# Patient Record
Sex: Male | Born: 1955
Health system: Southern US, Community
[De-identification: ages and names within clinical notes are randomized; demographics above are authoritative.]

## PROBLEM LIST (undated history)

## (undated) DIAGNOSIS — F1010 Alcohol abuse, uncomplicated: Secondary | ICD-10-CM

## (undated) DIAGNOSIS — R569 Unspecified convulsions: Secondary | ICD-10-CM

## (undated) DIAGNOSIS — Z91148 Patient's other noncompliance with medication regimen for other reason: Secondary | ICD-10-CM

## (undated) DIAGNOSIS — E039 Hypothyroidism, unspecified: Secondary | ICD-10-CM

## (undated) DIAGNOSIS — I4510 Unspecified right bundle-branch block: Secondary | ICD-10-CM

## (undated) DIAGNOSIS — I10 Essential (primary) hypertension: Secondary | ICD-10-CM

## (undated) DIAGNOSIS — Z9114 Patient's other noncompliance with medication regimen: Secondary | ICD-10-CM

## (undated) HISTORY — DX: Alcohol abuse, uncomplicated: F10.10

## (undated) HISTORY — DX: Unspecified right bundle-branch block: I45.10

## (undated) HISTORY — DX: Essential (primary) hypertension: I10

## (undated) HISTORY — DX: Hypothyroidism, unspecified: E03.9

## (undated) HISTORY — PX: NO PAST SURGERIES: SHX2092

## (undated) NOTE — *Deleted (*Deleted)
Patient Name: Mason Hart  MRN: [id number]  Epilepsy Attending: Charlsie Quest  Referring Physician/Provider: Dr. Shahram Giovanni Date: 10/21/2020 Duration:   Patient history: 33 year old male with history of alcohol abuse, seizures, noncompliance of medications presented with altered mental status.  EEG evaluate for seizures.  Level of alertness: Awake, drowsy, sleep, comatose, lethargic  AEDs during EEG study: Keppra  Technical aspects: This EEG study was done with scalp electrodes positioned according to the 10-20 International system of electrode placement. Electrical activity was acquired at a sampling rate of 500Hz  and reviewed with a high frequency filter of 70Hz  and a low frequency filter of 1Hz . EEG data were recorded continuously and digitally stored.   Description: The posterior dominant rhythm consists of 9-10 Hz activity of moderate voltage (25-35 uV) seen predominantly in posterior head regions, symmetric and reactive to eye opening and eye closing. Drowsiness was characterized by attenuation of the posterior background rhythm. Sleep was characterized by vertex waves, sleep spindles (12 to 14 Hz), maximal frontocentral region.  There is an excessive amount of 15 to 18 Hz, 2-3 uV beta activity with irregular morphology distributed symmetrically and diffusely.   EEG showed continuous/intermittent generalized 3 to 6 Hz theta-delta slowing.  Hyperventilation did not show any EEG change.  Physiology photic driving was not seen during photic stimulation.  Hyperventilation and photic stimulation were not performed.     ABNORMALITY -Excessive beta, generalized -Intermittent slow, generalized -Continued slow, generalized -Triphasic waves, generalized -Sharp wave, -Spike, -Periodic epileptiform discharges, generalized/lateralized   IMPRESSION: This study is within normal limits.  The study suggestive of mild/moderate/severe diffuse encephalopathy, nonspecific etiology but  likely related to sedation, toxic-metabolic etiology, anoxic/hypoxic brain injury This study showed evidence of potential epileptogenicity arising from The study showed seizures arising from No seizures or epileptiform discharges were seen throughout the recording. However, only wakefulness and drowsiness were recorded. If suspicion for interictal activity remains a concern, a prolonged study including sleep should be considered.  The excessive beta activity seen in the background is most likely due to the effect of benzodiazepine and is a benign EEG pattern.   [name]

---

## 2006-11-29 ENCOUNTER — Emergency Department (HOSPITAL_COMMUNITY): Admission: EM | Admit: 2006-11-29 | Discharge: 2006-11-29 | Payer: Self-pay | Admitting: Emergency Medicine

## 2007-02-18 ENCOUNTER — Emergency Department (HOSPITAL_COMMUNITY): Admission: EM | Admit: 2007-02-18 | Discharge: 2007-02-18 | Payer: Self-pay | Admitting: Emergency Medicine

## 2008-01-05 ENCOUNTER — Emergency Department (HOSPITAL_COMMUNITY): Admission: EM | Admit: 2008-01-05 | Discharge: 2008-01-05 | Payer: Self-pay | Admitting: Emergency Medicine

## 2008-07-13 ENCOUNTER — Emergency Department (HOSPITAL_COMMUNITY): Admission: EM | Admit: 2008-07-13 | Discharge: 2008-07-13 | Payer: Self-pay | Admitting: Emergency Medicine

## 2009-06-14 ENCOUNTER — Emergency Department (HOSPITAL_COMMUNITY): Admission: EM | Admit: 2009-06-14 | Discharge: 2009-06-14 | Payer: Self-pay | Admitting: Emergency Medicine

## 2010-01-03 ENCOUNTER — Emergency Department (HOSPITAL_COMMUNITY): Admission: EM | Admit: 2010-01-03 | Discharge: 2010-01-03 | Payer: Self-pay | Admitting: Emergency Medicine

## 2010-03-15 ENCOUNTER — Emergency Department (HOSPITAL_COMMUNITY): Admission: EM | Admit: 2010-03-15 | Discharge: 2010-03-15 | Payer: Self-pay | Admitting: Emergency Medicine

## 2010-04-17 ENCOUNTER — Emergency Department (HOSPITAL_COMMUNITY): Admission: EM | Admit: 2010-04-17 | Discharge: 2010-04-17 | Payer: Self-pay | Admitting: Emergency Medicine

## 2010-06-10 ENCOUNTER — Emergency Department (HOSPITAL_COMMUNITY): Admission: EM | Admit: 2010-06-10 | Discharge: 2010-06-10 | Payer: Self-pay | Admitting: Family Medicine

## 2010-09-05 ENCOUNTER — Emergency Department (HOSPITAL_COMMUNITY): Admission: EM | Admit: 2010-09-05 | Discharge: 2010-09-05 | Payer: Self-pay | Admitting: Emergency Medicine

## 2011-02-09 LAB — BASIC METABOLIC PANEL
CO2: 28 mEq/L (ref 19–32)
Calcium: 8.9 mg/dL (ref 8.4–10.5)
GFR calc non Af Amer: >60 mL/min (ref >60)
Sodium: 140 mEq/L (ref 135–145)

## 2011-02-09 LAB — ETHANOL: Alcohol, Ethyl (B): 6 mg/dL (ref 0–10)

## 2011-02-09 LAB — PHENYTOIN LEVEL, TOTAL: Phenytoin Lvl: <2.5 ug/mL — ABNORMAL LOW (ref 10.0–20.0)

## 2011-02-13 LAB — URINALYSIS, ROUTINE W REFLEX MICROSCOPIC
Bilirubin Urine: NEGATIVE
Glucose, UA: NEGATIVE mg/dL
Ketones, ur: NEGATIVE mg/dL
Protein, ur: NEGATIVE mg/dL

## 2011-02-13 LAB — GLUCOSE, CAPILLARY: Glucose-Capillary: 105 mg/dL — ABNORMAL HIGH (ref 70–99)

## 2011-02-13 LAB — POCT I-STAT, CHEM 8
Chloride: 101 mEq/L (ref 96–112)
Creatinine, Ser: 0.6 mg/dL (ref 0.4–1.5)
Glucose, Bld: 98 mg/dL (ref 70–99)
HCT: 41 % (ref 39.0–52.0)
Hemoglobin: 13.9 g/dL (ref 13.0–17.0)

## 2011-02-15 LAB — CBC
MCHC: 34.4 g/dL (ref 30.0–36.0)
MCV: 94.3 fL (ref 78.0–100.0)
RBC: 3.96 MIL/uL — ABNORMAL LOW (ref 4.22–5.81)
RDW: 13 % (ref 11.5–15.5)
WBC: 9.7 10*3/uL (ref 4.0–10.5)

## 2011-02-15 LAB — RAPID URINE DRUG SCREEN, HOSP PERFORMED
Amphetamines: NOT DETECTED
Barbiturates: NOT DETECTED
Benzodiazepines: NOT DETECTED
Cocaine: NOT DETECTED
Opiates: NOT DETECTED
Tetrahydrocannabinol: NOT DETECTED

## 2011-02-15 LAB — URINALYSIS, ROUTINE W REFLEX MICROSCOPIC
Glucose, UA: NEGATIVE mg/dL
Leukocytes, UA: NEGATIVE
Protein, ur: NEGATIVE mg/dL
pH: 5.5 (ref 5.0–8.0)

## 2011-02-15 LAB — COMPREHENSIVE METABOLIC PANEL
ALT: 62 U/L — ABNORMAL HIGH (ref 0–53)
AST: 74 U/L — ABNORMAL HIGH (ref 0–37)
CO2: 29 mEq/L (ref 19–32)
Calcium: 9.1 mg/dL (ref 8.4–10.5)
Creatinine, Ser: 0.68 mg/dL (ref 0.4–1.5)
Glucose, Bld: 100 mg/dL — ABNORMAL HIGH (ref 70–99)
Potassium: 4 mEq/L (ref 3.5–5.1)
Total Protein: 7.8 g/dL (ref 6.0–8.3)

## 2011-02-15 LAB — POCT CARDIAC MARKERS
CKMB, poc: 4.6 ng/mL (ref 1.0–8.0)
Myoglobin, poc: 87.7 ng/mL (ref 12–200)

## 2011-02-15 LAB — ETHANOL: Alcohol, Ethyl (B): 5 mg/dL (ref 0–10)

## 2011-02-15 LAB — URINE MICROSCOPIC-ADD ON

## 2011-02-15 LAB — DIFFERENTIAL: Lymphocytes Relative: 12 % (ref 12–46)

## 2011-02-23 ENCOUNTER — Emergency Department (HOSPITAL_COMMUNITY)
Admission: EM | Admit: 2011-02-23 | Discharge: 2011-02-23 | Disposition: A | Discharge status: Home / Self Care | Payer: BC Managed Care – PPO | Attending: Emergency Medicine | Admitting: Emergency Medicine

## 2011-02-23 DIAGNOSIS — G40909 Epilepsy, unspecified, not intractable, without status epilepticus: Secondary | ICD-10-CM | POA: Insufficient documentation

## 2011-02-23 LAB — COMPREHENSIVE METABOLIC PANEL
Albumin: 3.3 g/dL — ABNORMAL LOW (ref 3.5–5.2)
BUN: 15 mg/dL (ref 6–23)
Calcium: 8.7 mg/dL (ref 8.4–10.5)
Glucose, Bld: 80 mg/dL (ref 70–99)
Sodium: 135 mEq/L (ref 135–145)
Total Protein: 6.7 g/dL (ref 6.0–8.3)

## 2011-02-23 LAB — URINALYSIS, ROUTINE W REFLEX MICROSCOPIC
Ketones, ur: NEGATIVE mg/dL
pH: 5.5 (ref 5.0–8.0)

## 2011-02-23 LAB — CBC
HCT: 37.1 % — ABNORMAL LOW (ref 39.0–52.0)
MCHC: 32.9 g/dL (ref 30.0–36.0)
Platelets: 242 10*3/uL (ref 150–400)
RDW: 12.8 % (ref 11.5–15.5)

## 2011-02-23 LAB — ETHANOL: Alcohol, Ethyl (B): <5 mg/dL (ref 0–10)

## 2011-02-23 LAB — PHENYTOIN LEVEL, TOTAL: Phenytoin Lvl: <2.5 ug/mL — ABNORMAL LOW (ref 10.0–20.0)

## 2011-02-23 LAB — DIFFERENTIAL
Basophils Absolute: 0.1 10*3/uL (ref 0.0–0.1)
Eosinophils Absolute: 0.2 10*3/uL (ref 0.0–0.7)
Eosinophils Relative: 3 % (ref 0–5)
Lymphocytes Relative: 31 % (ref 12–46)
Monocytes Absolute: 0.7 10*3/uL (ref 0.1–1.0)

## 2011-02-23 LAB — RAPID URINE DRUG SCREEN, HOSP PERFORMED
Benzodiazepines: NOT DETECTED
Cocaine: NOT DETECTED

## 2011-03-05 LAB — DIFFERENTIAL
Basophils Relative: 1 % (ref 0–1)
Monocytes Absolute: 0.5 10*3/uL (ref 0.1–1.0)
Monocytes Relative: 7 % (ref 3–12)
Neutro Abs: 5.2 10*3/uL (ref 1.7–7.7)

## 2011-03-05 LAB — CBC
HCT: 37.1 % — ABNORMAL LOW (ref 39.0–52.0)
Platelets: 226 10*3/uL (ref 150–400)
RDW: 12.7 % (ref 11.5–15.5)

## 2011-03-05 LAB — RAPID URINE DRUG SCREEN, HOSP PERFORMED
Amphetamines: NOT DETECTED
Benzodiazepines: NOT DETECTED
Cocaine: NOT DETECTED
Tetrahydrocannabinol: NOT DETECTED

## 2011-03-05 LAB — COMPREHENSIVE METABOLIC PANEL
Albumin: 3.4 g/dL — ABNORMAL LOW (ref 3.5–5.2)
Alkaline Phosphatase: 64 U/L (ref 39–117)
BUN: 11 mg/dL (ref 6–23)
Potassium: 3.7 mEq/L (ref 3.5–5.1)
Sodium: 138 mEq/L (ref 135–145)
Total Protein: 7.3 g/dL (ref 6.0–8.3)

## 2011-03-05 LAB — ETHANOL: Alcohol, Ethyl (B): <5 mg/dL (ref 0–10)

## 2011-03-05 LAB — URINALYSIS, ROUTINE W REFLEX MICROSCOPIC
Bilirubin Urine: NEGATIVE
Glucose, UA: NEGATIVE mg/dL
Hgb urine dipstick: NEGATIVE
Ketones, ur: NEGATIVE mg/dL
pH: 6 (ref 5.0–8.0)

## 2011-03-14 ENCOUNTER — Emergency Department (HOSPITAL_COMMUNITY)
Admission: EM | Admit: 2011-03-14 | Discharge: 2011-03-14 | Disposition: A | Discharge status: Home / Self Care | Payer: BC Managed Care – PPO | Attending: Emergency Medicine | Admitting: Emergency Medicine

## 2011-03-14 DIAGNOSIS — Z79899 Other long term (current) drug therapy: Secondary | ICD-10-CM | POA: Insufficient documentation

## 2011-03-14 DIAGNOSIS — IMO0002 Reserved for concepts with insufficient information to code with codable children: Secondary | ICD-10-CM | POA: Insufficient documentation

## 2011-03-14 DIAGNOSIS — Y929 Unspecified place or not applicable: Secondary | ICD-10-CM | POA: Insufficient documentation

## 2011-03-14 DIAGNOSIS — R32 Unspecified urinary incontinence: Secondary | ICD-10-CM | POA: Insufficient documentation

## 2011-03-14 DIAGNOSIS — K7010 Alcoholic hepatitis without ascites: Secondary | ICD-10-CM | POA: Insufficient documentation

## 2011-03-14 DIAGNOSIS — S01502A Unspecified open wound of oral cavity, initial encounter: Secondary | ICD-10-CM | POA: Insufficient documentation

## 2011-03-14 DIAGNOSIS — G40909 Epilepsy, unspecified, not intractable, without status epilepticus: Secondary | ICD-10-CM | POA: Insufficient documentation

## 2011-03-14 DIAGNOSIS — F29 Unspecified psychosis not due to a substance or known physiological condition: Secondary | ICD-10-CM | POA: Insufficient documentation

## 2011-03-14 LAB — URINALYSIS, ROUTINE W REFLEX MICROSCOPIC
Nitrite: NEGATIVE
Protein, ur: 30 mg/dL — AB
Specific Gravity, Urine: 1.027 (ref 1.005–1.030)
Urobilinogen, UA: 2 mg/dL — ABNORMAL HIGH (ref 0.0–1.0)

## 2011-03-14 LAB — COMPREHENSIVE METABOLIC PANEL
BUN: 15 mg/dL (ref 6–23)
Calcium: 8.7 mg/dL (ref 8.4–10.5)
Glucose, Bld: 98 mg/dL (ref 70–99)
Total Protein: 7.3 g/dL (ref 6.0–8.3)

## 2011-03-14 LAB — CBC
HCT: 35.9 % — ABNORMAL LOW (ref 39.0–52.0)
MCH: 30.8 pg (ref 26.0–34.0)
MCHC: 33.7 g/dL (ref 30.0–36.0)
MCV: 91.3 fL (ref 78.0–100.0)
RDW: 12.7 % (ref 11.5–15.5)

## 2011-03-14 LAB — DIFFERENTIAL
Basophils Absolute: 0.1 10*3/uL (ref 0.0–0.1)
Eosinophils Relative: 1 % (ref 0–5)
Lymphocytes Relative: 23 % (ref 12–46)
Lymphs Abs: 1.7 10*3/uL (ref 0.7–4.0)
Monocytes Absolute: 0.7 10*3/uL (ref 0.1–1.0)
Monocytes Relative: 9 % (ref 3–12)

## 2011-03-14 LAB — URINE MICROSCOPIC-ADD ON

## 2011-03-14 LAB — PHENYTOIN LEVEL, TOTAL: Phenytoin Lvl: 0.7 ug/mL — ABNORMAL LOW (ref 10.0–20.0)

## 2011-03-14 LAB — RAPID URINE DRUG SCREEN, HOSP PERFORMED
Amphetamines: NOT DETECTED
Benzodiazepines: NOT DETECTED
Opiates: NOT DETECTED
Tetrahydrocannabinol: NOT DETECTED

## 2011-03-14 LAB — ETHANOL: Alcohol, Ethyl (B): <5 mg/dL (ref 0–10)

## 2011-07-24 ENCOUNTER — Emergency Department (HOSPITAL_COMMUNITY)
Admission: EM | Admit: 2011-07-24 | Discharge: 2011-07-24 | Disposition: A | Discharge status: Home / Self Care | Payer: BC Managed Care – PPO | Attending: Emergency Medicine | Admitting: Emergency Medicine

## 2011-07-24 ENCOUNTER — Emergency Department (HOSPITAL_COMMUNITY): Payer: BC Managed Care – PPO

## 2011-07-24 DIAGNOSIS — I4510 Unspecified right bundle-branch block: Secondary | ICD-10-CM | POA: Insufficient documentation

## 2011-07-24 DIAGNOSIS — R569 Unspecified convulsions: Secondary | ICD-10-CM | POA: Insufficient documentation

## 2011-07-24 LAB — COMPREHENSIVE METABOLIC PANEL
ALT: 15 U/L (ref 0–53)
Alkaline Phosphatase: 77 U/L (ref 39–117)
CO2: 30 mEq/L (ref 19–32)
Chloride: 102 mEq/L (ref 96–112)
GFR calc Af Amer: >60 mL/min (ref >60)
Glucose, Bld: 110 mg/dL — ABNORMAL HIGH (ref 70–99)
Potassium: 3.4 mEq/L — ABNORMAL LOW (ref 3.5–5.1)
Sodium: 136 mEq/L (ref 135–145)
Total Bilirubin: 0.6 mg/dL (ref 0.3–1.2)
Total Protein: 7 g/dL (ref 6.0–8.3)

## 2011-07-24 LAB — DIFFERENTIAL
Basophils Absolute: 0 10*3/uL (ref 0.0–0.1)
Basophils Relative: 0 % (ref 0–1)
Eosinophils Absolute: 0 10*3/uL (ref 0.0–0.7)
Eosinophils Relative: 0 % (ref 0–5)
Lymphs Abs: 1.4 10*3/uL (ref 0.7–4.0)
Neutrophils Relative %: 79 % — ABNORMAL HIGH (ref 43–77)

## 2011-07-24 LAB — PROTIME-INR: Prothrombin Time: 13.9 seconds (ref 11.6–15.2)

## 2011-07-24 LAB — CK TOTAL AND CKMB (NOT AT ARMC): CK, MB: 4.6 ng/mL — ABNORMAL HIGH (ref 0.3–4.0)

## 2011-07-24 LAB — CBC
MCHC: 33.2 g/dL (ref 30.0–36.0)
Platelets: 205 10*3/uL (ref 150–400)
RDW: 12.4 % (ref 11.5–15.5)
WBC: 10.6 10*3/uL — ABNORMAL HIGH (ref 4.0–10.5)

## 2011-07-24 LAB — GLUCOSE, CAPILLARY: Glucose-Capillary: 114 mg/dL — ABNORMAL HIGH (ref 70–99)

## 2011-07-24 LAB — POCT I-STAT, CHEM 8
Calcium, Ion: 1.22 mmol/L (ref 1.12–1.32)
Chloride: 102 mEq/L (ref 96–112)
HCT: 40 % (ref 39.0–52.0)
Hemoglobin: 13.6 g/dL (ref 13.0–17.0)

## 2011-07-30 NOTE — Consult Note (Signed)
NAMEMarland Kitchen  YERACHMIEL, SPINNEY NO.:  [id number]  MEDICAL RECORD NO.:  [id number]  LOCATION:                                 FACILITY:  PHYSICIAN:  Florestine Avers, MD         DATE OF BIRTH:  September 20, 1956  DATE OF CONSULTATION:  07/24/2011 DATE OF DISCHARGE:                                CONSULTATION   REFERRING PHYSICIAN:  Hassan Buckler. Caporossi, MD.  HISTORY OF PRESENT ILLNESS:  This is a 55 year old male who presents after experiencing a witnessed seizure episode followed by right-sided weakness.  The history was obtained from both EMS and by the patient's account.  EMS reported that the patient was on a bus when he demonstrated seizure-like activity affecting the upper and lower extremities.  This was witnessed by the bus driver.  This was followed by a period of postictal confusion in addition to right-sided weakness involving the right face and right upper extremity.  He was noted to also bite his tongue but presents without any evidence of urinary incontinence.  The patient reports that he does have known history of epilepsy and not had his Dilantin for at least 3 weeks.  He denies any blurry or double vision, headaches, nausea, vomiting, chest pain, shortness of breath, numbness, tingling, or difficulty with comprehension.  PAST MEDICAL HISTORY:  Seizure disorder.  MEDICATIONS:  Per chart review, the patient is on Dilantin 90 mg 3 times a day.  The patient was unaware of name of medications.  ALLERGIES:  No known drug allergies.  FAMILY HISTORY:  The patient does not endorse any significant medical problems.  SOCIAL HISTORY:  He admits to drinking.  REVIEW OF SYMPTOMS:  All systems were reviewed and are negative except as noted above.  PHYSICAL EXAMINATION:  VITAL SIGNS:  Blood pressure 117/46, pulse 82, respirations 23, temperature 98.1. GENERAL:  Well developed, in no acute distress. LUNGS:  Clear to auscultation bilaterally. CARDIOVASCULAR:  Regular  rate and rhythm.  NEUROLOGIC EXAMINATION:  Mental status:  Alert and oriented x3.  Cranial nerves significant only for subtle right facial droop, which improved upon arrival.  Otherwise, cranial nerves II-XII grossly intact.  Motor: Slight right pronator drift, again which improved throughout examination.  Sensation intact to light touch in both upper and lower extremities.  Coordination:  Intact finger-nose-finger and heel-to-shin. Reflexes 1+ and symmetrical both upper and lower extremities.  Gait deferred.  LABS:  White count 10.6, hemoglobin 12.1, platelets 205.  Sodium 136, potassium 3.4, chloride 102, bicarb 30, glucose 110, BUN 10, creatinine 0.60, AST 20, ALT 15.  CT scan of head, no acute intracranial abnormality.  ASSESSMENT AND PLAN:  This is a 55 year old male who initially presented as a code stroke secondary to abrupt onset of right-sided weakness.  He was later determined that he initially experienced a witnessed seizure episode with noted tongue laceration along the right side.  Therefore, the patient was deemed not to be a candidate for IV TPA due to seizure onset of presentation.  PLAN: 1. Recommend to obtain basic lab work, and if no abnormalities, rec to load with Dilantin either 500 or 1000 mg IV. 2. We will  place back on maintenance Dilantin, monotherapy of 90 mg three times a day.          ______________________________ Florestine Avers, MD     CL/MEDQ  D:  07/24/2011  T:  07/24/2011  Job:  [id number]  Electronically Signed by Florestine Avers MD on 07/30/2011 06:30:32 AM

## 2011-09-14 ENCOUNTER — Emergency Department (HOSPITAL_COMMUNITY)
Admission: EM | Admit: 2011-09-14 | Discharge: 2011-09-14 | Disposition: A | Discharge status: Home / Self Care | Payer: BC Managed Care – PPO | Attending: Emergency Medicine | Admitting: Emergency Medicine

## 2011-09-14 DIAGNOSIS — R404 Transient alteration of awareness: Secondary | ICD-10-CM | POA: Insufficient documentation

## 2011-09-14 DIAGNOSIS — F29 Unspecified psychosis not due to a substance or known physiological condition: Secondary | ICD-10-CM | POA: Insufficient documentation

## 2011-09-14 DIAGNOSIS — G40909 Epilepsy, unspecified, not intractable, without status epilepticus: Secondary | ICD-10-CM | POA: Insufficient documentation

## 2011-09-14 LAB — DIFFERENTIAL
Basophils Absolute: 0.1 10*3/uL (ref 0.0–0.1)
Eosinophils Absolute: 0.1 10*3/uL (ref 0.0–0.7)
Eosinophils Relative: 1 % (ref 0–5)
Lymphocytes Relative: 29 % (ref 12–46)
Monocytes Absolute: 0.6 10*3/uL (ref 0.1–1.0)

## 2011-09-14 LAB — CBC
HCT: 36.8 % — ABNORMAL LOW (ref 39.0–52.0)
MCHC: 32.9 g/dL (ref 30.0–36.0)
MCV: 90 fL (ref 78.0–100.0)
RDW: 12.8 % (ref 11.5–15.5)

## 2011-09-14 LAB — COMPREHENSIVE METABOLIC PANEL
Albumin: 3.6 g/dL (ref 3.5–5.2)
BUN: 20 mg/dL (ref 6–23)
Creatinine, Ser: 0.78 mg/dL (ref 0.50–1.35)
Potassium: 4.1 mEq/L (ref 3.5–5.1)
Total Protein: 7.6 g/dL (ref 6.0–8.3)

## 2012-04-04 ENCOUNTER — Emergency Department (HOSPITAL_COMMUNITY)
Admission: EM | Admit: 2012-04-04 | Discharge: 2012-04-04 | Disposition: A | Discharge status: Home / Self Care | Payer: BC Managed Care – PPO | Attending: Emergency Medicine | Admitting: Emergency Medicine

## 2012-04-04 ENCOUNTER — Encounter (HOSPITAL_COMMUNITY): Payer: Self-pay | Admitting: *Deleted

## 2012-04-04 DIAGNOSIS — G40909 Epilepsy, unspecified, not intractable, without status epilepticus: Secondary | ICD-10-CM | POA: Insufficient documentation

## 2012-04-04 DIAGNOSIS — R569 Unspecified convulsions: Secondary | ICD-10-CM

## 2012-04-04 HISTORY — DX: Unspecified convulsions: R56.9

## 2012-04-04 LAB — COMPREHENSIVE METABOLIC PANEL
ALT: 19 U/L (ref 0–53)
Alkaline Phosphatase: 76 U/L (ref 39–117)
CO2: 27 mEq/L (ref 19–32)
GFR calc Af Amer: >90 mL/min (ref >90)
Glucose, Bld: 76 mg/dL (ref 70–99)
Potassium: 3.5 mEq/L (ref 3.5–5.1)
Sodium: 138 mEq/L (ref 135–145)
Total Protein: 7 g/dL (ref 6.0–8.3)

## 2012-04-04 LAB — URINALYSIS, MICROSCOPIC ONLY
Bilirubin Urine: NEGATIVE
Hgb urine dipstick: NEGATIVE
Ketones, ur: NEGATIVE mg/dL
Nitrite: NEGATIVE
pH: 6 (ref 5.0–8.0)

## 2012-04-04 LAB — GLUCOSE, CAPILLARY: Glucose-Capillary: 82 mg/dL (ref 70–99)

## 2012-04-04 LAB — RAPID URINE DRUG SCREEN, HOSP PERFORMED
Amphetamines: NOT DETECTED
Barbiturates: NOT DETECTED
Benzodiazepines: NOT DETECTED
Cocaine: NOT DETECTED
Tetrahydrocannabinol: NOT DETECTED

## 2012-04-04 LAB — DIFFERENTIAL
Lymphocytes Relative: 18 % (ref 12–46)
Lymphs Abs: 1.8 10*3/uL (ref 0.7–4.0)
Neutrophils Relative %: 71 % (ref 43–77)

## 2012-04-04 LAB — CBC
Platelets: 237 10*3/uL (ref 150–400)
RBC: 3.81 MIL/uL — ABNORMAL LOW (ref 4.22–5.81)
WBC: 9.7 10*3/uL (ref 4.0–10.5)

## 2012-04-04 MED ORDER — SODIUM CHLORIDE 0.9 % IV SOLN
1000.0000 mg | Freq: Once | INTRAVENOUS | Status: AC
  Administered 2012-04-04: 1000 mg via INTRAVENOUS
  Filled 2012-04-04: qty 20

## 2012-04-04 MED ORDER — LORAZEPAM 1 MG PO TABS
1.0000 mg | ORAL_TABLET | Freq: Once | ORAL | Status: AC
  Administered 2012-04-04: 1 mg via ORAL
  Filled 2012-04-04: qty 1

## 2012-04-04 MED ORDER — PHENYTOIN SODIUM EXTENDED 100 MG PO CAPS: 100.0000 mg | ORAL_CAPSULE | Freq: Three times a day (TID) | ORAL | Status: DC

## 2012-04-04 MED ORDER — PHENYTOIN SODIUM EXTENDED 100 MG PO CAPS: 300.0000 mg | ORAL_CAPSULE | Freq: Once | ORAL | Status: DC

## 2012-04-04 NOTE — ED Notes (Signed)
Pt wife at bedside. Pt wife states the patient had two seizures last night. Pt wife at bedside with pt. Pt in no apparent distress and responding to family appropriately.

## 2012-04-04 NOTE — ED Notes (Signed)
AVW:[id number]<XB> Expected date:04/04/12<BR> Expected time:<BR> Means of arrival:<BR> Comments:<BR> EMS 60 GC - seizure/hx of same

## 2012-04-04 NOTE — ED Notes (Signed)
Pt was found at [facility] where he is the [place]. Pt had a witnesses seizure in which staff stated seizure activity last about a minute.  Staff reports the patient has a history of seizure activity, but pt adamantly denies this history. Pt post ictal for EMS.  Pt is oriented to self only at this time.  No oral trauma noted.  Incontinence not noted. Pt diaphoretic on EMS arrival.  Pt denies pain. Pt has right bundle branch block on monitor per EMS>

## 2012-04-04 NOTE — ED Notes (Signed)
Pt denies pain.  Pt answer questions and following simple commands. Pt continues to be lethargic.

## 2012-04-04 NOTE — Discharge Instructions (Signed)
Start taking dilantin daily. Please follow up with primary care doctor or neurology. No driving, climbing ladders, swimming, or anything that could endanger your health  RESOURCE GUIDE  Dental Problems  Patients with Medicaid: [facility] [phone]                                           [phone] [address] Phone:  [phone]                                                  Phone:  [phone]  If unable to pay or uninsured, contact:  [facility] or [facility]. to become qualified for the adult dental clinic.  Chronic Pain Problems Contact [facility]  [phone] Patients need to be referred by their primary care doctor.  Insufficient Money for Medicine Contact [employer]:  call "211" or [facility] 617-090-9499.  No Primary Care Doctor Call [facility]  [phone] Other agencies that provide inexpensive medical care    [facility]  [phone]    [facility]  [phone]    [facility]  7144741929    [facility]  [phone]    [facility]  [phone]    [facility]  [phone]  Psychological Services [facility]  [phone] [facility] Services  [phone] [facility] Mental Health   [phone] (emergency services ([phone])  Substance Abuse Resources [facility]  3437485320 [facility] [phone] [address] [place] [phone] Floydene Flock [phone] Residential & Outpatient Substance Abuse Program  [phone]  Abuse/Neglect [facility] Child Abuse Hotline 551-789-7682 [facility] Child Abuse Hotline 212-136-5234 (After Hours)  Emergency Shelter [facility] [phone]) [phone]  M[facility] [phone] [phone] [facility] [phone]  MRSA Hotline #:   843-337-4867    [facility]  Resources  [facility]     [employer]                          [facility] [address] [place]                       [address]      [address]  [place]                                                Cristobal Goldmann Phone:  [phone]                                   Phone:  [phone]  Phone:  [phone]  [facility] Mental Health Phone:  [phone]  [facility] Child Abuse Hotline 418-217-1091 971-424-7694 (After Hours)    Driving and Equipment Restrictions Some medical problems make it dangerous to drive, ride a bike, or use machines. Some of these problems are:  A hard blow to the head (concussion).   Passing out (fainting).   Twitching and shaking (seizures).   Low blood sugar.   Taking medicine to help you relax (sedatives).   Taking pain medicines.   Wearing an eye patch.   Wearing splints. This can make it hard to use parts of your body that you need to drive safely.  HOME CARE   Do not drive until your doctor says it is okay.   Do not use machines until your doctor says it is okay.  You may need a form signed by your doctor (medical release) before you can drive again. You may also need this form before you do other tasks where you need to be fully alert. MAKE SURE YOU:  Understand these instructions.   Will watch your condition.   Will get help right away if you are not doing well or get worse.  Document Released: 12/21/2004 Document Revised: 11/02/2011 Document Reviewed: 03/23/2010 [facility] Patient Information 2012 [place], [place].  Epilepsy A seizure (convulsion) is a sudden change in brain function that causes a change in behavior, muscle activity, or ability to remain awake and alert. If a person has recurring seizures, this is called epilepsy. CAUSES  Epilepsy is a disorder with many possible causes. Anything that disturbs the normal  pattern of brain cell activity can lead to seizures. Seizure can be caused from illness to brain damage to abnormal brain development. Epilepsy may develop because of:  An abnormality in brain wiring.   An imbalance of nerve signaling chemicals (neurotransmitters).   Some combination of these factors.  Scientists are learning an increasing amount about genetic causes of seizures. SYMPTOMS  The symptoms of a seizure can vary greatly from one person to another. These may include:  An aura, or warning that tells a person they are about to have a seizure.   Abnormal sensations, such as abnormal smell or seeing flashing lights.   Sudden, general body stiffness.   Rhythmic jerking of the face, arm, or leg - on one or both sides.   Sudden change in consciousness.   The person may appear to be awake but not responding.   They may appear to be asleep but cannot be awakened.   Grimacing, chewing, lip smacking, or drooling.   Often there is a period of sleepiness after a seizure.  DIAGNOSIS  The description you give to your caregiver about what you experienced will help them understand your problems. Equally important is the description by any witnesses to your seizure. A physical exam, including a detailed neurological exam, is necessary. An EEG (electroencephalogram) is a painless test of your brain waves. In this test a diagram is created of your brain waves. These diagrams can be interpreted by a specialist. Pictures of your brain are usually taken with:  An MRI.   A CT scan.  Lab tests may be done to look for:  Signs of infection.   Abnormal blood chemistry.  PREVENTION  There is no way to prevent the development of epilepsy. If you have seizures that are typically triggered by an event (such as flashing lights), try to avoid the trigger. This can help you avoid a seizure.  PROGNOSIS  Most people with epilepsy lead outwardly normal lives. While epilepsy cannot currently be cured,  for some people it does eventually go away. Most seizures do not cause brain damage. It is not uncommon for people with epilepsy, especially children, to develop behavioral and emotional problems. These problems are sometimes the consequence of medicine for seizures or social stress. For some people with epilepsy, the risk of seizures restricts their independence and recreational activities. For example, some states refuse drivers licenses to people with epilepsy. Most women with epilepsy can become pregnant. They should discuss their epilepsy and the medicine they are taking with their caregivers. Women with epilepsy have a 90 percent or better chance of having a normal, healthy baby. RISKS AND COMPLICATIONS  People with epilepsy are at increased risk of falls, accidents, and injuries. People with epilepsy are at special risk for two life-threatening conditions. These are status epilepticus and sudden unexplained death (extremely rare). Status epilepticus is a long lasting, continuous seizure that is a medical emergency. TREATMENT  Once epilepsy is diagnosed, it is important to begin treatment as soon as possible. For about 80 percent of those diagnosed with epilepsy, seizures can be controlled with modern medicines and surgical techniques. Some antiepileptic drugs can interfere with the effectiveness of oral contraceptives. In 1997, the FDA approved a pacemaker for the brain the (vagus nerve stimulator). This stimulator can be used for people with seizures that are not well-controlled by medicine. Studies have shown that in some cases, children may experience fewer seizures if they maintain a strict diet. The strict diet is called the ketogenic diet. This diet is rich in fats and low in carbohydrates. HOME CARE INSTRUCTIONS   Your caregiver will make recommendations about driving and safety in normal activities. Follow these carefully.   Take any medicine prescribed exactly as directed.   Do any blood  tests requested to monitor the levels of your medicine.   The people you live and work with should know that you are prone to seizures. They should receive instructions on how to help you. In general, a witness to a seizure should:   Cushion your head and body.   Turn you on your side.   Avoid unnecessarily restraining you.   Not place anything inside your mouth.   Call for local emergency medical help if there is any question about what has occurred.   Keep a seizure diary. Record what you recall about any seizure, especially any possible trigger.   If your caregiver has given you a follow-up appointment, it is very important to keep that appointment. Not keeping the appointment could result in permanent injury and disability. If there is any problem keeping the appointment, you must call back to this facility for assistance.  SEEK MEDICAL CARE IF:   You develop signs of infection or other illness. This might increase the risk of a seizure.   You seem to be having more frequent seizures.   Your seizure pattern is changing.  SEEK IMMEDIATE MEDICAL CARE IF:   A seizure does not stop after a few moments.   A seizure causes any difficulty in breathing.   A seizure results in a very severe headache.   A seizure leaves you with the inability to speak or use a part of your body.  MAKE SURE YOU:   Understand these instructions.   Will watch your condition.   Will get help right away if you are not doing well or get worse.  Document Released:  11/13/2005 Document Revised: 11/02/2011 Document Reviewed: 06/19/2008 [facility] Patient Information 2012 [place], [place].

## 2012-04-04 NOTE — ED Notes (Signed)
Pt alert and oriented x3 at this time. Pt did not have an episode of incontinence, no oral trauma and pt not post-ictal. Pt reports he has had  A seizure before about six months ago and was seen at [facility]. Pt was prescribed medication for seizure but did not get it filled as he felt this was not something he needed to take as the seizure was a "one time thing." Pt denies pain. Pt denies changes this AM or feeling unwell this AM prior to seizure. Pt not aware of what happened. 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

## 2012-04-04 NOTE — ED Notes (Signed)
Pt. was gowned put on monitor

## 2012-04-04 NOTE — ED Notes (Signed)
Dilantin infusion completed.  Pt in no apparent distress with family at bedside awaiting further plan in care.

## 2012-04-04 NOTE — ED Notes (Signed)
Pt appears post- ictal.  PA made aware. Pt not as responsive, appears slightly diaphoretic and lethargic. Pt answering simple commands. Vitals retaken and stable.

## 2012-04-04 NOTE — ED Notes (Signed)
School has called pt family/friend that may be able to provide more history and information

## 2012-04-04 NOTE — ED Notes (Signed)
Pt remains on monitor during infusion of dilantin with frequent BP Checks

## 2012-04-04 NOTE — ED Provider Notes (Signed)
History     CSN: [id number]  Arrival date & time 04/04/12  1336   First MD Initiated Contact with Patient 04/04/12 1407      Chief Complaint  Patient presents with  . Seizures    (Consider location/radiation/quality/duration/timing/severity/associated sxs/prior treatment) Patient is a 56 y.o. male presenting with seizures. The history is provided by the patient.  Seizures  This is a new problem. The current episode started less than 1 hour ago. There was 1 seizure. The most recent episode lasted 30 to 120 seconds. Pertinent negatives include no headaches, no speech difficulty and no visual disturbance. Characteristics include rhythmic jerking and loss of consciousness. Characteristics do not include bowel incontinence, bladder incontinence or bit tongue. The episode was witnessed. The seizures did not continue in the ED.  PT with hx of seizures which he states " he is not sure if he has hx of." Pt state he used to take "some pills" but states they ran out and he didn't have a doctor to have them refilled. Pt denies any complaints currently. Stats last thing   Past Medical History  Diagnosis Date  . Seizures     History reviewed. No pertinent past surgical history.  No family history on file.  History  Substance Use Topics  . Smoking status: Never Smoker   . Smokeless tobacco: Not on file  . Alcohol Use: 4.2 oz/week    7 Cans of beer per week      Review of Systems  Constitutional: Negative for fever and chills.  Eyes: Negative for visual disturbance.  Respiratory: Negative.   Cardiovascular: Negative.   Gastrointestinal: Negative.  Negative for bowel incontinence.  Genitourinary: Negative.  Negative for bladder incontinence.  Musculoskeletal: Negative.   Skin: Negative.   Neurological: Positive for seizures and loss of consciousness. Negative for dizziness, speech difficulty, light-headedness, numbness and headaches.  Psychiatric/Behavioral: Negative.     Allergies    Review of patient's allergies indicates no known allergies.  Home Medications  No current outpatient prescriptions on file.  BP 139/68  Pulse 82  Temp(Src) 98.7 F (37.1 C) (Oral)  Resp 13  SpO2 97%  Physical Exam  Nursing note and vitals reviewed. Constitutional: He is oriented to person, place, and time. He appears well-developed and well-nourished. No distress.  HENT:  Head: Normocephalic and atraumatic.  Right Ear: External ear normal.  Left Ear: External ear normal.  Nose: Nose normal.  Mouth/Throat: Oropharynx is clear and moist.       Blood noted over left lower gum, around the lower right incisor, tooth intact, does not appear loose, tongue normal.  Eyes: Conjunctivae and EOM are normal. Pupils are equal, round, and reactive to light.  Neck: Neck supple.  Cardiovascular: Normal rate, regular rhythm and normal heart sounds.   Pulmonary/Chest: Effort normal and breath sounds normal. No respiratory distress. He has no wheezes.  Abdominal: Soft. Bowel sounds are normal. He exhibits no distension. There is no tenderness.  Musculoskeletal: Normal range of motion.  Neurological: He is alert and oriented to person, place, and time. He has normal reflexes. No cranial nerve deficit. Coordination normal.       Normal coordination, normal finger to nose, 5/5 and equal upper and lower strength bilaterally   Skin: Skin is warm and dry. No erythema.  Psychiatric: He has a normal mood and affect.    ED Course  Procedures (including critical care time)  Pt post witnessed seizure. Pt currently alert and oriented x3. He is in no  distress. Normal neuro exam. Will get labs, monitor.   Results for orders placed during the hospital encounter of 04/04/12  GLUCOSE, CAPILLARY      Component Value Range   Glucose-Capillary 82  70 - 99 (mg/dL)   Comment 1 Documented in Chart     Comment 2 Notify RN    CBC      Component Value Range   WBC 9.7  4.0 - 10.5 (K/uL)   RBC 3.81 (*) 4.22 - 5.81  (MIL/uL)   Hemoglobin 11.6 (*) 13.0 - 17.0 (g/dL)   HCT 19.1 (*) 47.8 - 52.0 (%)   MCV 92.9  78.0 - 100.0 (fL)   MCH 30.4  26.0 - 34.0 (pg)   MCHC 32.8  30.0 - 36.0 (g/dL)   RDW 29.5  62.1 - 30.8 (%)   Platelets 237  150 - 400 (K/uL)  DIFFERENTIAL      Component Value Range   Neutrophils Relative 71  43 - 77 (%)   Neutro Abs 6.9  1.7 - 7.7 (K/uL)   Lymphocytes Relative 18  12 - 46 (%)   Lymphs Abs 1.8  0.7 - 4.0 (K/uL)   Monocytes Relative 9  3 - 12 (%)   Monocytes Absolute 0.9  0.1 - 1.0 (K/uL)   Eosinophils Relative 1  0 - 5 (%)   Eosinophils Absolute 0.1  0.0 - 0.7 (K/uL)   Basophils Relative 0  0 - 1 (%)   Basophils Absolute 0.0  0.0 - 0.1 (K/uL)  COMPREHENSIVE METABOLIC PANEL      Component Value Range   Sodium 138  135 - 145 (mEq/L)   Potassium 3.5  3.5 - 5.1 (mEq/L)   Chloride 103  96 - 112 (mEq/L)   CO2 27  19 - 32 (mEq/L)   Glucose, Bld 76  70 - 99 (mg/dL)   BUN 14  6 - 23 (mg/dL)   Creatinine, Ser 6.57  0.50 - 1.35 (mg/dL)   Calcium 8.9  8.4 - 84.6 (mg/dL)   Total Protein 7.0  6.0 - 8.3 (g/dL)   Albumin 3.4 (*) 3.5 - 5.2 (g/dL)   AST 27  0 - 37 (U/L)   ALT 19  0 - 53 (U/L)   Alkaline Phosphatase 76  39 - 117 (U/L)   Total Bilirubin 0.4  0.3 - 1.2 (mg/dL)   GFR calc non Af Amer >90  >90 (mL/min)   GFR calc Af Amer >90  >90 (mL/min)  URINALYSIS, WITH MICROSCOPIC      Component Value Range   Color, Urine YELLOW  YELLOW    APPearance CLEAR  CLEAR    Specific Gravity, Urine 1.021  1.005 - 1.030    pH 6.0  5.0 - 8.0    Glucose, UA NEGATIVE  NEGATIVE (mg/dL)   Hgb urine dipstick NEGATIVE  NEGATIVE    Bilirubin Urine NEGATIVE  NEGATIVE    Ketones, ur NEGATIVE  NEGATIVE (mg/dL)   Protein, ur NEGATIVE  NEGATIVE (mg/dL)   Urobilinogen, UA 1.0  0.0 - 1.0 (mg/dL)   Nitrite NEGATIVE  NEGATIVE    Leukocytes, UA NEGATIVE  NEGATIVE    Squamous Epithelial / LPF RARE  RARE   URINE RAPID DRUG SCREEN (HOSP PERFORMED)      Component Value Range   Opiates NONE DETECTED  NONE  DETECTED    Cocaine NONE DETECTED  NONE DETECTED    Benzodiazepines NONE DETECTED  NONE DETECTED    Amphetamines NONE DETECTED  NONE DETECTED  Tetrahydrocannabinol NONE DETECTED  NONE DETECTED    Barbiturates NONE DETECTED  NONE DETECTED   ETHANOL      Component Value Range   Alcohol, Ethyl (B) <11  0 - 11 (mg/dL)   Just got told by a nurse that pt may have had another seizure. When she walked in he was confused.i reassessed pt, he is unable to to tell me where he is or current year, appears solmolent. It appears from previous records, pt was on dilantin, which he is not taking. Will load dilantin IV.   5:37 PM Pt awake, aaox3. Will d/c home with family, will start on dilantin.    1. Seizure       MDM          Lottie Mussel, PA 04/04/12 1835

## 2012-04-04 NOTE — ED Notes (Signed)
CBG 82 

## 2012-04-08 NOTE — ED Provider Notes (Signed)
Medical screening examination/treatment/procedure(s) were performed by non-physician practitioner and as supervising physician I was immediately available for consultation/collaboration.  Kaeo Jacome, MD 04/08/12 1015 

## 2012-07-22 ENCOUNTER — Encounter (HOSPITAL_COMMUNITY): Payer: Self-pay | Admitting: Emergency Medicine

## 2012-07-22 ENCOUNTER — Emergency Department (HOSPITAL_COMMUNITY)
Admission: EM | Admit: 2012-07-22 | Discharge: 2012-07-22 | Disposition: A | Discharge status: Home / Self Care | Payer: BC Managed Care – PPO | Attending: Emergency Medicine | Admitting: Emergency Medicine

## 2012-07-22 DIAGNOSIS — G40909 Epilepsy, unspecified, not intractable, without status epilepticus: Secondary | ICD-10-CM

## 2012-07-22 DIAGNOSIS — R569 Unspecified convulsions: Secondary | ICD-10-CM

## 2012-07-22 LAB — BASIC METABOLIC PANEL
BUN: 8 mg/dL (ref 6–23)
CO2: 27 mEq/L (ref 19–32)
Calcium: 9.2 mg/dL (ref 8.4–10.5)
Chloride: 98 mEq/L (ref 96–112)
Creatinine, Ser: 0.66 mg/dL (ref 0.50–1.35)
GFR calc Af Amer: >90 mL/min (ref >90)

## 2012-07-22 LAB — CBC
HCT: 36.5 % — ABNORMAL LOW (ref 39.0–52.0)
MCHC: 33.2 g/dL (ref 30.0–36.0)
MCV: 90.8 fL (ref 78.0–100.0)
Platelets: 264 10*3/uL (ref 150–400)
RDW: 13.2 % (ref 11.5–15.5)
WBC: 8.8 10*3/uL (ref 4.0–10.5)

## 2012-07-22 MED ORDER — PHENYTOIN SODIUM EXTENDED 100 MG PO CAPS
300.0000 mg | ORAL_CAPSULE | Freq: Once | ORAL | Status: AC
  Administered 2012-07-22: 300 mg via ORAL
  Filled 2012-07-22: qty 3

## 2012-07-22 NOTE — ED Notes (Signed)
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 Pt with small trauma to tongue pt did not urinate on himself. Pt is alert and oriented at this time

## 2012-07-22 NOTE — ED Notes (Signed)
[id number]<AV> Expected date:<BR> Expected time:<BR> Means of arrival:<BR> Comments:<BR> EMS male 57 seizure

## 2012-07-22 NOTE — ED Provider Notes (Signed)
History     CSN: [id number]  Arrival date & time 07/22/12  6295   First MD Initiated Contact with Patient 07/22/12 0930     Chief complaint: seizure   (Consider location/radiation/quality/duration/timing/severity/associated sxs/prior treatment) The history is provided by the patient.  pt with hx seizure disorder for past several years. States had seizure today. Unsure how long lasted. By ems arrival to ed pt alert, oriented, at baseline. States takes dilantin, and that he is compliant w rx. Denies etoh abuse, stating will have a couple beers 1-2x/week.  Pt denies pain or injury. States recent health at baseline. No recent febrile illness. No headaches. No change in speech or vision. No numbness/weakness. No cp or sob. No palpitations. No abd pain. No nvd. No gu c/o. w sz, bit tongue, no incontinence.   Past Medical History  Diagnosis Date  . Seizures     No past surgical history on file.  No family history on file.  History  Substance Use Topics  . Smoking status: Never Smoker   . Smokeless tobacco: Not on file  . Alcohol Use: 4.2 oz/week    7 Cans of beer per week      Review of Systems  Constitutional: Negative for fever and chills.  HENT: Negative for neck pain.   Eyes: Negative for visual disturbance.  Respiratory: Negative for cough and shortness of breath.   Cardiovascular: Negative for chest pain and leg swelling.  Gastrointestinal: Negative for abdominal pain.  Genitourinary: Negative for dysuria and flank pain.  Musculoskeletal: Negative for back pain.  Skin: Negative for rash.  Neurological: Negative for weakness, numbness and headaches.  Hematological: Does not bruise/bleed easily.  Psychiatric/Behavioral: Negative for confusion.    Allergies  Review of patient's allergies indicates no known allergies.  Home Medications   Current Outpatient Rx  Name Route Sig Dispense Refill  . DIPHENHYDRAMINE HCL 25 MG PO TABS Oral Take 25 mg by mouth as needed. For  allergies    . PHENYTOIN SODIUM EXTENDED 100 MG PO CAPS Oral Take 1 capsule (100 mg total) by mouth 3 (three) times daily. 90 capsule 1    BP 130/64  Pulse 106  Temp 98.5 F (36.9 C) (Oral)  Resp 18  SpO2 96%  Physical Exam  Nursing note and vitals reviewed. Constitutional: He is oriented to person, place, and time. He appears well-developed and well-nourished. No distress.  HENT:  Head: Atraumatic.       Contusion lateral edge tongue  Eyes: Conjunctivae are normal. Pupils are equal, round, and reactive to light.  Neck: Normal range of motion. Neck supple. No tracheal deviation present.       No bruit  Cardiovascular: Normal rate, regular rhythm, normal heart sounds and intact distal pulses.   Pulmonary/Chest: Effort normal and breath sounds normal. No accessory muscle usage. No respiratory distress. He exhibits no tenderness.  Abdominal: Soft. Bowel sounds are normal. He exhibits no distension. There is no tenderness.  Genitourinary:       No cva tenderness  Musculoskeletal: Normal range of motion. He exhibits no edema and no tenderness.  Neurological: He is alert and oriented to person, place, and time. No cranial nerve deficit.       Motor intact bil. Steady gait  Skin: Skin is warm and dry.  Psychiatric: He has a normal mood and affect.    ED Course  Procedures (including critical care time)  Results for orders placed during the hospital encounter of 07/22/12  BASIC METABOLIC PANEL  Component Value Range   Sodium 135  135 - 145 mEq/L   Potassium 4.0  3.5 - 5.1 mEq/L   Chloride 98  96 - 112 mEq/L   CO2 27  19 - 32 mEq/L   Glucose, Bld 105 (*) 70 - 99 mg/dL   BUN 8  6 - 23 mg/dL   Creatinine, Ser 1.61  0.50 - 1.35 mg/dL   Calcium 9.2  8.4 - 09.6 mg/dL   GFR calc non Af Amer >90  >90 mL/min   GFR calc Af Amer >90  >90 mL/min  CBC      Component Value Range   WBC 8.8  4.0 - 10.5 K/uL   RBC 4.02 (*) 4.22 - 5.81 MIL/uL   Hemoglobin 12.1 (*) 13.0 - 17.0 g/dL    HCT 04.5 (*) 40.9 - 52.0 %   MCV 90.8  78.0 - 100.0 fL   MCH 30.1  26.0 - 34.0 pg   MCHC 33.2  30.0 - 36.0 g/dL   RDW 81.1  91.4 - 78.2 %   Platelets 264  150 - 400 K/uL  PHENYTOIN LEVEL, TOTAL      Component Value Range   Phenytoin Lvl <2.5 (*) 10.0 - 20.0 ug/mL       MDM  Iv ns. Seizure precautions.  Labs.     Date: 07/22/2012  Rate: 102  Rhythm: sinus tachycardia  QRS Axis: normal  Intervals: normal  ST/T Wave abnormalities: nonspecific ST/T changes  Conduction Disutrbances:right bundle branch block  Narrative Interpretation:   Old EKG Reviewed: unchanged   Reviewed nursing notes and prior charts for additional history.  Reviewed prior charts, multiple prior cts for eval same, multiple prior uds and etoh levels all negative. Has had neurology eval in past for same.    Recheck pt awake and alert. No seizure activity. No c/o.  Po fluids.  Dilantin level low - on review charts, similar noncompliance in past, pt states has adequate dilantin at home.      Suzi Roots, MD 07/22/12 1246

## 2012-07-30 ENCOUNTER — Ambulatory Visit: Payer: BC Managed Care – PPO | Admitting: Neurology

## 2012-08-12 ENCOUNTER — Encounter (HOSPITAL_COMMUNITY): Payer: Self-pay | Admitting: Emergency Medicine

## 2012-08-12 ENCOUNTER — Emergency Department (HOSPITAL_COMMUNITY)
Admission: EM | Admit: 2012-08-12 | Discharge: 2012-08-12 | Disposition: A | Discharge status: Home Health | Payer: BC Managed Care – PPO | Attending: Emergency Medicine | Admitting: Emergency Medicine

## 2012-08-12 DIAGNOSIS — R569 Unspecified convulsions: Secondary | ICD-10-CM | POA: Insufficient documentation

## 2012-08-12 DIAGNOSIS — G40909 Epilepsy, unspecified, not intractable, without status epilepticus: Secondary | ICD-10-CM

## 2012-08-12 LAB — POCT I-STAT, CHEM 8
BUN: 15 mg/dL (ref 6–23)
Calcium, Ion: 1.2 mmol/L (ref 1.12–1.23)
Chloride: 102 meq/L (ref 96–112)
Creatinine, Ser: 0.9 mg/dL (ref 0.50–1.35)
Glucose, Bld: 91 mg/dL (ref 70–99)
HCT: 40 % (ref 39.0–52.0)
Hemoglobin: 13.6 g/dL (ref 13.0–17.0)
Potassium: 4.8 meq/L (ref 3.5–5.1)
Sodium: 138 meq/L (ref 135–145)
TCO2: 26 mmol/L (ref 0–100)

## 2012-08-12 LAB — PHENYTOIN LEVEL, TOTAL: Phenytoin Lvl: <2.5 ug/mL — ABNORMAL LOW (ref 10.0–20.0)

## 2012-08-12 MED ORDER — PHENYTOIN 50 MG PO CHEW
400.0000 mg | CHEWABLE_TABLET | Freq: Three times a day (TID) | ORAL | Status: DC
  Administered 2012-08-12: 400 mg via ORAL
  Filled 2012-08-12 (×3): qty 8

## 2012-08-12 MED ORDER — PHENYTOIN 50 MG PO CHEW
300.0000 mg | CHEWABLE_TABLET | Freq: Once | ORAL | Status: AC
  Administered 2012-08-12: 300 mg via ORAL
  Filled 2012-08-12: qty 6

## 2012-08-12 MED ORDER — PHENYTOIN SODIUM EXTENDED 300 MG PO CAPS: ORAL_CAPSULE | ORAL | Status: DC

## 2012-08-12 MED ORDER — LORAZEPAM 1 MG PO TABS
1.0000 mg | ORAL_TABLET | Freq: Once | ORAL | Status: AC
  Administered 2012-08-12: 1 mg via ORAL
  Filled 2012-08-12: qty 1

## 2012-08-12 NOTE — ED Provider Notes (Signed)
Medical screening examination/treatment/procedure(s) were performed by non-physician practitioner and as supervising physician I was immediately available for consultation/collaboration.   Gavin Pound. Sem Mccaughey, MD 08/12/12 [phone]

## 2012-08-12 NOTE — ED Notes (Signed)
Upon patient rounding visit patient diaphoretic and states he has recently experienced additional seizure.  Patient with no loss of bowel or bladder contents.  Patient alert and oriented at this time

## 2012-08-12 NOTE — ED Notes (Signed)
Denies missing any dilantin doses

## 2012-08-12 NOTE — ED Notes (Signed)
To ED via EMS from work for reported seizure, arrives on LSB, no trauma noted, minimal oral trauma with no active bleeding, no urinary incontinence, A/O X4, no pain, no SOB, NAD

## 2012-08-12 NOTE — ED Provider Notes (Signed)
History     CSN: [id number]  Arrival date & time 08/12/12  4098   First MD Initiated Contact with Patient 08/12/12 [phone]      Chief Complaint  Patient presents with  . Seizures    (Consider location/radiation/quality/duration/timing/severity/associated sxs/prior treatment) Patient is a 56 y.o. male presenting with seizures.  Seizures    Patient presents with a seizure that occurred this morning while at work. Patient is suppose to take Dilantin. The patient has not been taking it as he is suppose to. The patient denies headache, weakness, numbness, vomiting, blurred vision, chest pain, or SOB. The patient states that he felt like he could just stay at work.  The patient denies any other issues related to this seizure. Past Medical History  Diagnosis Date  . Seizures     History reviewed. No pertinent past surgical history.  No family history on file.  History  Substance Use Topics  . Smoking status: Never Smoker   . Smokeless tobacco: Not on file  . Alcohol Use: 4.2 oz/week    7 Cans of beer per week     occassionally      Review of Systems  Neurological: Positive for seizures.   All other systems negative except as documented in the HPI. All pertinent positives and negatives as reviewed in the HPI.  Allergies  Review of patient's allergies indicates no known allergies.  Home Medications   Current Outpatient Rx  Name Route Sig Dispense Refill  . DIPHENHYDRAMINE HCL 25 MG PO TABS Oral Take 50 mg by mouth daily as needed. For allergies    . PHENYTOIN SODIUM EXTENDED 100 MG PO CAPS Oral Take 1 capsule (100 mg total) by mouth 3 (three) times daily. 90 capsule 1    BP 136/77  Pulse 86  Temp 97.9 F (36.6 C) (Oral)  Resp 14  SpO2 96%  Physical Exam  Constitutional: He is oriented to person, place, and time. He appears well-developed and well-nourished. No distress.  HENT:  Head: Normocephalic and atraumatic.  Mouth/Throat: Oropharynx is clear and moist.    Eyes: Pupils are equal, round, and reactive to light.  Neck: Normal range of motion. Neck supple.  Cardiovascular: Normal rate, regular rhythm and normal heart sounds.  Exam reveals no gallop and no friction rub.   No murmur heard. Pulmonary/Chest: Effort normal and breath sounds normal. No respiratory distress.  Musculoskeletal:       Cervical back: Normal.  Neurological: He is alert and oriented to person, place, and time. He displays normal reflexes. No sensory deficit. He exhibits normal muscle tone. Coordination and gait normal.  Skin: Skin is warm and dry. No rash noted.    ED Course  Procedures (including critical care time)  Labs Reviewed  PHENYTOIN LEVEL, TOTAL - Abnormal; Notable for the following:    Phenytoin Lvl <2.5 (*)     All other components within normal limits  POCT I-STAT, CHEM 8   Patient had another brief seizure. Returned to baseline quickly. Will have patient start back in Dilantin. The patient is advised to return here as needed.   The patient is referred back to his PCP as well. The patient is advised to return here as needed.   MDM  MDM Reviewed: vitals and nursing note Interpretation: labs            Carlyle Dolly, PA-C 08/12/12 1539

## 2012-10-02 ENCOUNTER — Emergency Department (HOSPITAL_COMMUNITY)
Admission: EM | Admit: 2012-10-02 | Discharge: 2012-10-02 | Disposition: A | Discharge status: Home / Self Care | Payer: BC Managed Care – PPO | Attending: Emergency Medicine | Admitting: Emergency Medicine

## 2012-10-02 ENCOUNTER — Encounter (HOSPITAL_COMMUNITY): Payer: Self-pay | Admitting: *Deleted

## 2012-10-02 DIAGNOSIS — G40909 Epilepsy, unspecified, not intractable, without status epilepticus: Secondary | ICD-10-CM | POA: Insufficient documentation

## 2012-10-02 DIAGNOSIS — Z760 Encounter for issue of repeat prescription: Secondary | ICD-10-CM | POA: Insufficient documentation

## 2012-10-02 DIAGNOSIS — Z79899 Other long term (current) drug therapy: Secondary | ICD-10-CM | POA: Insufficient documentation

## 2012-10-02 MED ORDER — PHENYTOIN SODIUM EXTENDED 100 MG PO CAPS: 300.0000 mg | ORAL_CAPSULE | Freq: Every day | ORAL | Status: DC

## 2012-10-02 NOTE — ED Notes (Signed)
Pt reports running out of his Dilantin this past Saturday. States that he needs a refill. Denies seizure activity.

## 2012-10-02 NOTE — ED Provider Notes (Signed)
History     CSN: [id number]  Arrival date & time 10/02/12  1616   First MD Initiated Contact with Patient 10/02/12 1641      Chief Complaint  Patient presents with  . Medication Refill    (Consider location/radiation/quality/duration/timing/severity/associated sxs/prior treatment) HPI Comments: 56 y/o male presents to the ED for a medication refill for dilantin. He takes this for seizures and ran out about 1 week ago. Denies any recent seizure activity. He does not have a PCP. States "I haven't had time to get off work to see a doctor". No other complaints today.  The history is provided by the patient.    Past Medical History  Diagnosis Date  . Seizures     History reviewed. No pertinent past surgical history.  History reviewed. No pertinent family history.  History  Substance Use Topics  . Smoking status: Never Smoker   . Smokeless tobacco: Not on file  . Alcohol Use: 4.2 oz/week    7 Cans of beer per week     Comment: occassionally      Review of Systems  Constitutional: Negative.   HENT: Negative.   Eyes: Negative.   Respiratory: Negative for shortness of breath.   Cardiovascular: Negative for chest pain.  Gastrointestinal: Negative.   Neurological: Negative for seizures and headaches.    Allergies  Review of patient's allergies indicates no known allergies.  Home Medications   Current Outpatient Rx  Name  Route  Sig  Dispense  Refill  . PHENYTOIN SODIUM EXTENDED 100 MG PO CAPS   Oral   Take 1 capsule (100 mg total) by mouth 3 (three) times daily.   90 capsule   1   . PHENYTOIN SODIUM EXTENDED 300 MG PO CAPS   Oral   Take 300 mg by mouth at bedtime.         Marland Kitchen PHENYTOIN SODIUM EXTENDED 100 MG PO CAPS   Oral   Take 3 capsules (300 mg total) by mouth daily.   90 capsule   0     BP 150/90  Pulse 99  Temp 98.1 F (36.7 C) (Oral)  Resp 16  Ht 6\' 1"  (1.854 m)  Wt 250 lb (113.399 kg)  BMI 32.98 kg/m2  SpO2 96%  Physical Exam  Nursing  note and vitals reviewed. Constitutional: He is oriented to person, place, and time. He appears well-developed. No distress.       Overweight  HENT:  Head: Normocephalic and atraumatic.  Eyes: Conjunctivae normal and EOM are normal. Pupils are equal, round, and reactive to light.  Neck: Normal range of motion. Neck supple.  Cardiovascular: Normal rate, regular rhythm and normal heart sounds.   Pulmonary/Chest: Effort normal and breath sounds normal.  Musculoskeletal: Normal range of motion. He exhibits no edema.  Neurological: He is alert and oriented to person, place, and time.  Skin: Skin is warm and dry.  Psychiatric: He has a normal mood and affect. His behavior is normal.    ED Course  Procedures (including critical care time)   Labs Reviewed  PHENYTOIN LEVEL, TOTAL   No results found.   1. Medication refill       MDM  Discussed importance of PCP follow up with patient. Gave resource list. Refilled dilantin. No other complaints/concerns today.       Trevor Mace, PA-C 10/02/12 1711

## 2012-10-03 NOTE — ED Provider Notes (Signed)
Medical screening examination/treatment/procedure(s) were performed by non-physician practitioner and as supervising physician I was immediately available for consultation/collaboration.   Celene Kras, MD 10/03/12 [phone]

## 2012-12-09 ENCOUNTER — Other Ambulatory Visit (INDEPENDENT_AMBULATORY_CARE_PROVIDER_SITE_OTHER): Payer: BC Managed Care – PPO

## 2012-12-09 ENCOUNTER — Ambulatory Visit (INDEPENDENT_AMBULATORY_CARE_PROVIDER_SITE_OTHER): Payer: BC Managed Care – PPO | Admitting: Internal Medicine

## 2012-12-09 ENCOUNTER — Encounter: Payer: Self-pay | Admitting: Internal Medicine

## 2012-12-09 VITALS — BP 112/80 | HR 91 | Temp 99.0°F | Resp 16 | Ht 67.0 in | Wt 245.0 lb

## 2012-12-09 DIAGNOSIS — Z23 Encounter for immunization: Secondary | ICD-10-CM

## 2012-12-09 DIAGNOSIS — Z0000 Encounter for general adult medical examination without abnormal findings: Secondary | ICD-10-CM

## 2012-12-09 DIAGNOSIS — Z131 Encounter for screening for diabetes mellitus: Secondary | ICD-10-CM

## 2012-12-09 DIAGNOSIS — Z1211 Encounter for screening for malignant neoplasm of colon: Secondary | ICD-10-CM

## 2012-12-09 DIAGNOSIS — Z130 Encounter for screening for diseases of the blood and blood-forming organs and certain disorders involving the immune mechanism: Secondary | ICD-10-CM

## 2012-12-09 DIAGNOSIS — R569 Unspecified convulsions: Secondary | ICD-10-CM

## 2012-12-09 DIAGNOSIS — Z125 Encounter for screening for malignant neoplasm of prostate: Secondary | ICD-10-CM

## 2012-12-09 DIAGNOSIS — Z13220 Encounter for screening for lipoid disorders: Secondary | ICD-10-CM

## 2012-12-09 LAB — CBC
HCT: 39.2 % (ref 39.0–52.0)
MCHC: 32.5 g/dL (ref 30.0–36.0)
MCV: 93.2 fl (ref 78.0–100.0)
RDW: 13 % (ref 11.5–14.6)

## 2012-12-09 LAB — LIPID PANEL
Cholesterol: 194 mg/dL (ref 0–200)
HDL: 76.8 mg/dL (ref >39.00)
Triglycerides: 54 mg/dL (ref 0.0–149.0)

## 2012-12-09 LAB — BASIC METABOLIC PANEL
Calcium: 9.4 mg/dL (ref 8.4–10.5)
GFR: 135.2 mL/min (ref >60.00)
Glucose, Bld: 89 mg/dL (ref 70–99)
Potassium: 4.2 mEq/L (ref 3.5–5.1)
Sodium: 140 mEq/L (ref 135–145)

## 2012-12-09 LAB — HEMOGLOBIN A1C: Hgb A1c MFr Bld: 4.5 % — ABNORMAL LOW (ref 4.6–6.5)

## 2012-12-09 MED ORDER — PHENYTOIN SODIUM EXTENDED 100 MG PO CAPS: 100.0000 mg | ORAL_CAPSULE | Freq: Three times a day (TID) | ORAL | Status: DC

## 2012-12-09 NOTE — Patient Instructions (Signed)
Health Maintenance, Males A healthy lifestyle and preventative care can promote health and wellness.  Maintain regular health, dental, and eye exams.  Eat a healthy diet. Foods like vegetables, fruits, whole grains, low-fat dairy products, and lean protein foods contain the nutrients you need without too many calories. Decrease your intake of foods high in solid fats, added sugars, and salt. Get information about a proper diet from your caregiver, if necessary.  Regular physical exercise is one of the most important things you can do for your health. Most adults should get at least 150 minutes of moderate-intensity exercise (any activity that increases your heart rate and causes you to sweat) each week. In addition, most adults need muscle-strengthening exercises on 2 or more days a week.   Maintain a healthy weight. The body mass index (BMI) is a screening tool to identify possible weight problems. It provides an estimate of body fat based on height and weight. Your caregiver can help determine your BMI, and can help you achieve or maintain a healthy weight. For adults 20 years and older:  A BMI below 18.5 is considered underweight.  A BMI of 18.5 to 24.9 is normal.  A BMI of 25 to 29.9 is considered overweight.  A BMI of 30 and above is considered obese.  Maintain normal blood lipids and cholesterol by exercising and minimizing your intake of saturated fat. Eat a balanced diet with plenty of fruits and vegetables. Blood tests for lipids and cholesterol should begin at age 20 and be repeated every 5 years. If your lipid or cholesterol levels are high, you are over 50, or you are a high risk for heart disease, you may need your cholesterol levels checked more frequently.Ongoing high lipid and cholesterol levels should be treated with medicines, if diet and exercise are not effective.  If you smoke, find out from your caregiver how to quit. If you do not use tobacco, do not start.  If you  choose to drink alcohol, do not exceed 2 drinks per day. One drink is considered to be 12 ounces (355 mL) of beer, 5 ounces (148 mL) of wine, or 1.5 ounces (44 mL) of liquor.  Avoid use of street drugs. Do not share needles with anyone. Ask for help if you need support or instructions about stopping the use of drugs.  High blood pressure causes heart disease and increases the risk of stroke. Blood pressure should be checked at least every 1 to 2 years. Ongoing high blood pressure should be treated with medicines if weight loss and exercise are not effective.  If you are 45 to 59 years old, ask your caregiver if you should take aspirin to prevent heart disease.  Diabetes screening involves taking a blood sample to check your fasting blood sugar level. This should be done once every 3 years, after age 45, if you are within normal weight and without risk factors for diabetes. Testing should be considered at a younger age or be carried out more frequently if you are overweight and have at least 1 risk factor for diabetes.  Colorectal cancer can be detected and often prevented. Most routine colorectal cancer screening begins at the age of 50 and continues through age 75. However, your caregiver may recommend screening at an earlier age if you have risk factors for colon cancer. On a yearly basis, your caregiver may provide home test kits to check for hidden blood in the stool. Use of a small camera at the end of a tube,   to directly examine the colon (sigmoidoscopy or colonoscopy), can detect the earliest forms of colorectal cancer. Talk to your caregiver about this at age 50, when routine screening begins. Direct examination of the colon should be repeated every 5 to 10 years through age 75, unless early forms of pre-cancerous polyps or small growths are found.  Hepatitis C blood testing is recommended for all people born from 1945 through 1965 and any individual with known risks for hepatitis C.  Healthy  men should no longer receive prostate-specific antigen (PSA) blood tests as part of routine cancer screening. Consult with your caregiver about prostate cancer screening.  Testicular cancer screening is not recommended for adolescents or adult males who have no symptoms. Screening includes self-exam, caregiver exam, and other screening tests. Consult with your caregiver about any symptoms you have or any concerns you have about testicular cancer.  Practice safe sex. Use condoms and avoid high-risk sexual practices to reduce the spread of sexually transmitted infections (STIs).  Use sunscreen with a sun protection factor (SPF) of 30 or greater. Apply sunscreen liberally and repeatedly throughout the day. You should seek shade when your shadow is shorter than you. Protect yourself by wearing long sleeves, pants, a wide-brimmed hat, and sunglasses year round, whenever you are outdoors.  Notify your caregiver of new moles or changes in moles, especially if there is a change in shape or color. Also notify your caregiver if a mole is larger than the size of a pencil eraser.  A one-time screening for abdominal aortic aneurysm (AAA) and surgical repair of large AAAs by sound wave imaging (ultrasonography) is recommended for ages 65 to 75 years who are current or former smokers.  Stay current with your immunizations. Document Released: 05/11/2008 Document Revised: 02/05/2012 Document Reviewed: 04/10/2011 ExitCare Patient Information 2013 [employer].  

## 2012-12-09 NOTE — Progress Notes (Signed)
HPI  Pt presents to the clinic today. He has never seen a PCP before. He is here because he has had 3 seizures this past year. With the last one, he went to the ER where they gave him dilantin. They have been refilling it until he could get in with a PCP. He has no history of seizures earlier in his life. He has no idea why they started. They only last a minute or so. He has lost consciouness.  Past Medical History  Diagnosis Date  . Seizures     Current Outpatient Prescriptions  Medication Sig Dispense Refill  . phenytoin (DILANTIN) 100 MG ER capsule Take 1 capsule (100 mg total) by mouth 3 (three) times daily.  90 capsule  1    No Known Allergies  Family History  Problem Relation Age of Onset  . Heart disease Mother   . Cancer Neg Hx   . Diabetes Neg Hx   . Stroke Neg Hx     History   Social History  . Marital Status: Married    Spouse Name: N/A    Number of Children: N/A  . Years of Education: N/A   Occupational History  . Not on file.   Social History Main Topics  . Smoking status: Never Smoker   . Smokeless tobacco: Never Used  . Alcohol Use: 4.2 oz/week    7 Cans of beer per week     Comment: occassionally  . Drug Use: No  . Sexually Active: Not Currently   Other Topics Concern  . Not on file   Social History Narrative   Works as [employer].Married, no children    ROS:  Constitutional: Denies fever, malaise, fatigue, headache or abrupt weight changes.  HEENT: Denies eye pain, eye redness, ear pain, ringing in the ears, wax buildup, runny nose, nasal congestion, bloody nose, or sore throat. Respiratory: Denies difficulty breathing, shortness of breath, cough or sputum production.   Cardiovascular: Denies chest pain, chest tightness, palpitations or swelling in the hands or feet.  Gastrointestinal: Denies abdominal pain, bloating, constipation, diarrhea or blood in the stool.  GU: Denies frequency, urgency, pain with urination, blood in urine, odor or  discharge. Musculoskeletal: Denies decrease in range of motion, difficulty with gait, muscle pain or joint pain and swelling.  Skin: Denies redness, rashes, lesions or ulcercations.  Neurological: Denies dizziness, difficulty with memory, difficulty with speech or problems with balance and coordination.   No other specific complaints in a complete review of systems (except as listed in HPI above).  PE:  BP 112/80  Pulse 91  Temp 99 F (37.2 C) (Oral)  Resp 16  Ht 5\' 7"  (1.702 m)  Wt 245 lb (111.131 kg)  BMI 38.37 kg/m2  SpO2 95% Wt Readings from Last 3 Encounters:  12/09/12 245 lb (111.131 kg)  10/02/12 250 lb (113.399 kg)    General: Appears his stated age, obese but well developed, well nourished in NAD. HEENT: Head: normal shape and size; Eyes: sclera white, no icterus, conjunctiva pink, PERRLA and EOMs intact; Ears: Tm's gray and intact, normal light reflex; Nose: mucosa pink and moist, septum midline; Throat/Mouth: Teeth present, mucosa pink and moist, no lesions or ulcerations noted.  Neck: Normal range of motion. Neck supple, trachea midline. No massses, lumps or thyromegaly present.  Cardiovascular: Normal rate and rhythm. S1,S2 noted.  No murmur, rubs or gallops noted. No JVD or BLE edema. No carotid bruits noted. Pulmonary/Chest: Normal effort and positive vesicular breath sounds. No respiratory  distress. No wheezes, rales or ronchi noted.  Abdomen: Soft and nontender. Normal bowel sounds, no bruits noted. No distention or masses noted. Liver, spleen and kidneys non palpable. Musculoskeletal: Normal range of motion. No signs of joint swelling. No difficulty with gait.  Neurological: Alert and oriented. Cranial nerves II-XII intact. Coordination normal. +DTRs bilaterally. Psychiatric: Mood and affect normal. Behavior is normal. Judgment and thought content normal.     Assessment and Plan:  Preventative Health Maintenance  Will obtain basic screening labs today Flu  vaccine today Tdap today Pt declines pneumovax and zostavax Start a diet and exercise program Will refer to GI for colonoscopy  Seizures, new onset with additional workup required:  Refilled dilantin today Will obtain dilantin level Will refer to neurology   RTC in 6 months or sooner if needed

## 2012-12-09 NOTE — Addendum Note (Signed)
Addended by: Elnora Morrison on: 12/09/2012 04:28 PM   Modules accepted: Orders

## 2012-12-19 ENCOUNTER — Ambulatory Visit: Payer: BC Managed Care – PPO | Admitting: Neurology

## 2012-12-19 ENCOUNTER — Encounter: Payer: Self-pay | Admitting: Neurology

## 2012-12-24 ENCOUNTER — Ambulatory Visit: Payer: BC Managed Care – PPO | Admitting: Neurology

## 2013-04-10 ENCOUNTER — Other Ambulatory Visit: Payer: Self-pay | Admitting: Internal Medicine

## 2013-08-12 ENCOUNTER — Telehealth: Payer: Self-pay | Admitting: Internal Medicine

## 2013-08-12 DIAGNOSIS — Z760 Encounter for issue of repeat prescription: Secondary | ICD-10-CM

## 2013-08-12 MED ORDER — PHENYTOIN SODIUM EXTENDED 100 MG PO CAPS: ORAL_CAPSULE | ORAL | Status: DC

## 2013-08-12 NOTE — Telephone Encounter (Signed)
Pt is requesting a refill on Dilantin to [facility].  Patient has no working numbers where he can be reached at this time

## 2013-08-12 NOTE — Telephone Encounter (Signed)
Faxed in rx for dilantin...ds,cma

## 2013-08-12 NOTE — Telephone Encounter (Signed)
Ok to refill 

## 2013-08-15 ENCOUNTER — Telehealth: Payer: Self-pay | Admitting: Internal Medicine

## 2013-08-15 NOTE — Telephone Encounter (Signed)
08/15/2013   Pt is requesting a letter to [facility] stating that Pt has seizures.  Pt has no working telephone and will come in person to pick up the letter.

## 2013-08-18 NOTE — Telephone Encounter (Signed)
Letter was done and placed upfront for patient to pick up....ds,cma

## 2013-08-18 NOTE — Telephone Encounter (Signed)
Can you take care of this for me.

## 2013-09-17 NOTE — Telephone Encounter (Signed)
A user error has taken place.

## 2014-08-31 ENCOUNTER — Emergency Department (HOSPITAL_COMMUNITY)
Admission: EM | Admit: 2014-08-31 | Discharge: 2014-08-31 | Disposition: A | Discharge status: Home / Self Care | Payer: BC Managed Care – PPO | Attending: Emergency Medicine | Admitting: Emergency Medicine

## 2014-08-31 ENCOUNTER — Encounter (HOSPITAL_COMMUNITY): Payer: Self-pay | Admitting: Emergency Medicine

## 2014-08-31 ENCOUNTER — Emergency Department (HOSPITAL_COMMUNITY): Payer: BC Managed Care – PPO

## 2014-08-31 DIAGNOSIS — Z79899 Other long term (current) drug therapy: Secondary | ICD-10-CM | POA: Diagnosis not present

## 2014-08-31 DIAGNOSIS — R569 Unspecified convulsions: Secondary | ICD-10-CM | POA: Diagnosis present

## 2014-08-31 DIAGNOSIS — Z9119 Patient's noncompliance with other medical treatment and regimen: Secondary | ICD-10-CM | POA: Insufficient documentation

## 2014-08-31 DIAGNOSIS — R791 Abnormal coagulation profile: Secondary | ICD-10-CM | POA: Insufficient documentation

## 2014-08-31 DIAGNOSIS — G40909 Epilepsy, unspecified, not intractable, without status epilepticus: Secondary | ICD-10-CM | POA: Diagnosis not present

## 2014-08-31 DIAGNOSIS — R7889 Finding of other specified substances, not normally found in blood: Secondary | ICD-10-CM

## 2014-08-31 HISTORY — DX: Patient's other noncompliance with medication regimen: Z91.14

## 2014-08-31 HISTORY — DX: Patient's other noncompliance with medication regimen for other reason: Z91.148

## 2014-08-31 LAB — PHENYTOIN LEVEL, TOTAL

## 2014-08-31 MED ORDER — PHENYTOIN SODIUM EXTENDED 100 MG PO CAPS: 100.0000 mg | ORAL_CAPSULE | Freq: Three times a day (TID) | ORAL | Status: DC

## 2014-08-31 MED ORDER — SODIUM CHLORIDE 0.9 % IV SOLN
1000.0000 mg | Freq: Once | INTRAVENOUS | Status: AC
  Administered 2014-08-31: 1000 mg via INTRAVENOUS
  Filled 2014-08-31: qty 20

## 2014-08-31 NOTE — Discharge Instructions (Signed)
°Emergency Department Resource Guide °1) Find a Doctor and Pay Out of Pocket °Although you won't have to find out who is covered by your insurance plan, it is a good idea to ask around and get recommendations. You will then need to call the office and see if the doctor you have chosen will accept you as a new patient and what types of options they offer for patients who are self-pay. Some doctors offer discounts or will set up payment plans for their patients who do not have insurance, but you will need to ask so you aren't surprised when you get to your appointment. ° °2) Contact Your Local Health Department °Not all health departments have doctors that can see patients for sick visits, but many do, so it is worth a call to see if yours does. If you don't know where your local health department is, you can check in your phone book. The CDC also has a tool to help you locate your state's health department, and many state websites also have listings of all of their local health departments. ° °3) Find a Walk-in Clinic °If your illness is not likely to be very severe or complicated, you may want to try a walk in clinic. These are popping up all over the country in pharmacies, drugstores, and shopping centers. They're usually staffed by nurse practitioners or physician assistants that have been trained to treat common illnesses and complaints. They're usually fairly quick and inexpensive. However, if you have serious medical issues or chronic medical problems, these are probably not your best option. ° °No Primary Care Doctor: °- Call [facility] at  832-8000 - they can help you locate a primary care doctor that  accepts your insurance, provides certain services, etc. °- Physician Referral Service- 1-800-533-3463 ° °Chronic Pain Problems: °Organization         Address  Phone   Notes  °[facility]  (336) 297-2271 Patients need to be referred by their primary care doctor.  ° °Medication  Assistance: °Organization         Address  Phone   Notes  °[place] [facility] [address] °[place], [place] [address] (336) 641-8030 --Must be a resident of [place] °-- Must have NO insurance coverage whatsoever (no Medicaid/ Medicare, etc.) °-- The pt. MUST have a primary care doctor that directs their care regularly and follows them in the community °  °[facility]  (866) 331-1348   °[employer] Way  (888) 892-1162   ° °Agencies that provide inexpensive medical care: °Organization         Address  Phone   Notes  °[facility]  (336) 832-8035   °[facility]    (336) 832-7272   °[facility] [address] °[place], [place] [address] (336) 832-4777   °[facility] [address], °[place] (336) 271-4999   °[facility]    (336) 373-0678   °[facility]    (336) 272-1050   °[facility] ° [address], [place] Phone:  (336) 832-4444, Fax:  (336) 832-4440 Hours of Operation:  9 am - 6 pm, M-F.  Also accepts Medicaid/Medicare and self-pay.  °[facility] ° [address], [place] Phone: (336) 832-3150, Fax: (336) [phone]. Hours of Operation:  8:30 am - 5:30 pm, M-F.  Also accepts Medicaid and self-pay.  °[facility] [place] [address], [place] Phone: (336) 878-6027   °[facility] [address], [place], [place] (336)723-1848, Ext. 123 Mondays & Thursdays: 7-9 AM.  First 15 patients are seen on a first come, first serve basis. °  ° °Medicaid-accepting [place] Providers: ° °Organization         Address  Phone   Notes  °[facility] [address], [place] (336) 641-2100 Also accepts self-pay patients.  °[facility] [address], [place] ° (336) 856-9996   °[facility] [address], [place]  (336) 288-8857   °[facility] [address] [place] [address], [place] (336) 299-7000   °Veita Bland [address], [place]  ° (336) 373-1557 Only accepts [place] Access Medicaid patients after they have their name applied to their card.  ° °Self-Pay (no insurance) in [place]: ° °Organization         Address  Phone   Notes  °Sickle Cell Patients, [facility] [address], [place] (336) 832-1970   °[facility] [address], [place] (336) 832-4400   °[facility] [place] ° [address], [place] (336) 992-4800   °[facility]/Dr. Osei-Bonsu ° [address] [place] [address], [place] or [address], [place] (336) [phone] Phone number for both [place] and [place] locations is the same.  °[facility] [address], [place] (336) 299-0000   °[facility] [address] [place] [address], [place] or [address] (336) 852-7530 °(336) 878-2260   °[facility] [address], [place] (336) 350-1642, phone; (336) 294-5005, fax Sees patients 1st and 3rd Saturday of every month.  Must not qualify for public or private insurance (i.e. Medicaid, Medicare, [place] Health Choice, Veterans' Benefits) • Household income should be no more than 200% of the poverty level •The clinic cannot treat you if you are pregnant or think you are pregnant • Sexually transmitted diseases are not treated at the clinic.  ° ° °Dental Care: °Organization         Address  Phone  Notes  °[place] [facility] [address], [place] (336) 641-6152 Accepts children up to age 21 who are enrolled in Medicaid or [place] Health Choice; pregnant women with a Medicaid card; and children who have applied for Medicaid or [place] Health Choice, but were declined, whose parents can pay a reduced fee at time of service.  °[place]  [facility] [place]  [address], [place] (336) 641-7733 Accepts children up to age 21 who are enrolled in Medicaid or [place] Health Choice; pregnant women with a Medicaid card; and children who have applied for Medicaid or  Health Choice, but were declined, whose parents can pay a reduced fee at time of service.  °[facility] ° [address],  (336) 641-4533 Patients are seen by appointment only. Walk-ins are not accepted. [facility] will see patients 18 years of age and older. °Monday - Tuesday (8am-5pm) °Most Wednesdays (8:30-5pm) °$30 per visit, cash only  °[facility] ° [address], [place] (336) 641-4533 Patients are seen by appointment only. Walk-ins are not accepted. [facility] will see patients 18 years of age and older. °One   Wednesday Evening (Monthly: Volunteer Based).  $30 per visit, cash only  °[facility]  (919) 537-3737 for adults; Children under age 4, call Graduate Pediatric Dentistry at ([phone]) [phone]. Children aged 4-14, please call (919) [phone] to request a pediatric application. ° Dental services are provided in all areas of dental care including fillings, crowns and bridges, complete and partial dentures, implants, gum treatment, root canals, and extractions. Preventive care is also provided. Treatment is provided to both adults and children. °Patients are selected via a lottery and there is often a waiting list. °  °[facility] [address], °[place] ° (336) 763-8833 [email or web address] °  °[facility] [address], [place], [place] (336)723-1848, Ext. 123 Second and Fourth Thursday of each month, opens at 6:30 AM; Clinic ends at 9 AM.  Patients are seen on a first-come first-served basis, and a limited number are seen during each clinic.  ° °[facility] ° [address], [place], [place] (336) [phone]    Eligibility Requirements °You must have lived in [place], [place], or [place] counties for at least the last three months. °  You cannot be eligible for state or federal sponsored healthcare insurance, including Veterans Administration, Medicaid, or Medicare. °  You generally cannot be eligible for healthcare insurance through your employer.  °  How to apply: °Eligibility screenings are held every Tuesday and Wednesday afternoon from 1:00 pm until 4:00 pm. You do not need an appointment for the interview!  °[facility] [address], [place], [place] 336-631-2330   °[facility]  336-342-8273   °[facility]  336-703-3100   °[facility]  336-570-6415   ° °Behavioral Health Resources in the Community: °Intensive Outpatient Programs °Organization         Address  Phone  Notes  °[facility] [address], [place], [place] 336-878-6098   °[facility] [address], [place], [place] 336-832-9800   °[facility] [address], [place], [place] ° 336-882-2125   °[facility] [address],  °[place], [place] 1-800-853-5163 or 336-641-4981   °Substance Abuse Resources °Organization         Address  Phone  Notes  °[facility]  336-882-2125   °[facility]  336-784-9470   °[facility]  336-285-9073   °[facility]  336-845-3988   °Residential & Outpatient Substance Abuse Program  1-800-659-3381   °Psychological Services °Organization         Address  Phone  Notes  °[facility]  336- 832-9600   °[facility]  336- 378-7881   °[facility] [address], [place] 1-800-853-5163 or 336-641-4981   ° °Mobile Crisis Teams °Organization         Address  Phone  Notes  °[facility], Mobile Crisis Care Unit  1-877-626-1772   °Assertive °[facility] ° [address]  [place], [place] 336-834-9664   °Sharon DeEsch [address] °[place] 336-554-5454   ° °Self-Help/Support Groups °Organization         Address  Phone             Notes  °[facility] [place] - variety of support groups  336- 373-1402 Call for more information  °Narcotics Anonymous (NA), [facility] [address], °[place]  2 meetings at this location  ° °  Residential Treatment Programs °Organization         Address  Phone  Notes  °[facility] [address],    °[place]  1-866-801-8205   °[facility] ° [address], [place], [place] 704-293-8524   °[facility] [address], [place] 336-845-3988 Admissions: 8am-3pm M-F  °[facility] [address]    °[place], [place] 336-841-1104   °[facility] [address], [place], [place] 336-379-7146   °[facility] [address]  °[place], [place] 336-285-9073   °[facility] - Intensive Outpatient [address], [place], [place] 336-852-3033   °[facility] [address]  °[place], [place] 1-877-615-2722 or 336-784-9470   °[facility] [address] [place], [place] 336-227-7417 Accepts Medicaid  °[facility] [address]  ° [place] 1-800-659-3381 Substance Abuse/Addiction Treatment  ° °[place] Behavioral Health Resources °Organization         Address  Phone  Notes  °[facility]  (888) 581-9988   °Julie Brannon, PhD [address] [place], [place]   (336) 349-5553 or (336) 951-0000   °[facility]   [address] °[place], [place] (336) 349-4454   °[facility] [address], [place], [place] (336) 342-8316 Insurance/Medicaid/sponsorship through [facility]  °[facility] [address]                                    [place], [place] (336) 342-8316 Therapy/tele-psych/case    °[facility] [address]  ° [place], [place] (336) 349-2233    °Dr. Arfeen  (336) 349-4544   °[facility] [place]  [employer] [place] [facility] 1) [address], [place] °2) [address], [place] °3)  [address] [place] [address], [place] (336) 349-3220 °(336) 342-7768 ° °(336) 342-8140   °[place] [facility] (336) 342-1394 or (336) 342-3537 (After Hours)    ° ° ° °Take the prescription as directed.  Call your regular medical doctor tomorrow to schedule a follow up appointment this week.  Return to the Emergency Department immediately sooner if worsening.  ° °

## 2014-08-31 NOTE — ED Notes (Signed)
Pt ambulating independently w/ steady gait on d/c in no acute distress, A&Ox4. Rx given x1  

## 2014-08-31 NOTE — ED Notes (Signed)
Per EMS, Patient was on [address] and bystanders called due to patient laying out in street without responding to bystanders. When EMS arrived, patient was confused but responsive. Throughout transport, Patient was able to regain memory of wife and where he lives. Vitals per EMS: 156/98, 92 HR, 16 RR, 100 % on 2L, 110 CBG

## 2014-08-31 NOTE — ED Notes (Signed)
Pt taken off [facility] w/ Dr. Clarene DukeMcManus and Tammy SoursGreg, RN

## 2014-08-31 NOTE — ED Provider Notes (Signed)
CSN: [id number]     Arrival date & time 08/31/14  1949 History   First MD Initiated Contact with Patient 08/31/14 2010     Chief Complaint  Patient presents with  . Seizures  . Altered Mental Status      HPI Pt was seen at 2035. Per EMS and pt report, c/o sudden onset and resolution of one episode of AMS that occurred PTA. Pt states he remembers walking down the street "and then I was on the ground." Pt was found laying on the street, not responding to bystanders. On EMS arrival to the scene: they noted pt to be responsive but confused, and incontinent of urine. Pt became more awake/alert en route to the ED. Pt endorses he has a hx of seizures and "I probably had another one." Endorses he has run out of his dilantin. Denies CP/SOB, no abd pain, no N/V/D, no back or neck pain, no headache, no visual changes, no focal motor weakness, no tingling/numbness in extremities. The symptoms have been associated with no other complaints. The patient has a significant history of similar symptoms previously, recently being evaluated for this complaint and multiple prior evals for same.       Past Medical History  Diagnosis Date  . Seizures   . Noncompliance with medication regimen    History reviewed. No pertinent past surgical history.  Family History  Problem Relation Age of Onset  . Heart disease Mother   . Cancer Neg Hx   . Diabetes Neg Hx   . Stroke Neg Hx    History  Substance Use Topics  . Smoking status: Never Smoker   . Smokeless tobacco: Never Used  . Alcohol Use: 4.2 oz/week    7 Cans of beer per week     Comment: occassionally    Review of Systems ROS: Statement: All systems negative except as marked or noted in the HPI; Constitutional: Negative for fever and chills. ; ; Eyes: Negative for eye pain, redness and discharge. ; ; ENMT: Negative for ear pain, hoarseness, nasal congestion, sinus pressure and sore throat. ; ; Cardiovascular: Negative for chest pain, palpitations,  diaphoresis, dyspnea and peripheral edema. ; ; Respiratory: Negative for cough, wheezing and stridor. ; ; Gastrointestinal: Negative for nausea, vomiting, diarrhea, abdominal pain, blood in stool, hematemesis, jaundice and rectal bleeding. . ; ; Genitourinary: Negative for dysuria, flank pain and hematuria. ; ; Musculoskeletal: Negative for back pain and neck pain. Negative for swelling and trauma.; ; Skin: Negative for pruritus, rash, abrasions, blisters, bruising and skin lesion.; ; Neuro: Negative for headache, lightheadedness and neck stiffness. Negative for weakness, extremity weakness, paresthesias, +AMS.      Allergies  Review of patient's allergies indicates no known allergies.  Home Medications   Prior to Admission medications   Medication Sig Start Date End Date Taking? Authorizing Provider  phenytoin (DILANTIN) 100 MG ER capsule Take 100 mg by mouth 2 (two) times daily.   Yes Historical Provider, MD  phenytoin (DILANTIN) 100 MG ER capsule Take 1 capsule (100 mg total) by mouth 3 (three) times daily. 08/31/14   Samuel JesterKathleen Shauna Bodkins, DO   BP 150/89  Pulse 80  Resp 17  SpO2 97% Physical Exam 2040: Physical examination: Vital signs and O2 SAT: Reviewed; Constitutional: Well developed, Well nourished, Well hydrated, In no acute distress; Head and Face: Normocephalic, Atraumatic; Eyes: EOMI, PERRL, No scleral icterus; ENMT: Mouth and pharynx normal, Left TM normal, Right TM normal, Mucous membranes moist; Neck: Immobilized in C-collar, Trachea  midline; Spine: Immobilized on spineboard, No midline CS, TS, LS tenderness.; Cardiovascular: Regular rate and rhythm, No murmur, rub, or gallop; Respiratory: Breath sounds clear & equal bilaterally, No rales, rhonchi, wheezes, Normal respiratory effort/excursion; Chest: Nontender, No deformity, Movement normal, No crepitus, No abrasions or ecchymosis.; Abdomen: Soft, Nontender, Nondistended, Normal bowel sounds, No abrasions or ecchymosis.; Genitourinary:  No CVA tenderness; Extremities: No deformity, Full range of motion major/large joints of bilat UE's and LE's without pain or tenderness to palp, Neurovascularly intact, Pulses normal, No tenderness, No edema, Pelvis stable; Neuro: AA&Ox3, GCS 15.  Major CN grossly intact. Speech clear. No gross focal motor or sensory deficits in extremities.; Skin: Color normal, Warm, Dry   ED Course  Procedures     EKG Interpretation   Date/Time:  Monday August 31 2014 19:54:13 EDT Ventricular Rate:  83 PR Interval:  172 QRS Duration: 142 QT Interval:  425 QTC Calculation: 499 R Axis:   55 Text Interpretation:  Sinus rhythm Left atrial enlargement Right bundle  branch block When compared with ECG of 07/22/2012 No significant change was  found Confirmed by [facility]  MD, Nicholos Johns [phone]) on 08/31/2014 8:42:28 PM      MDM  MDM Reviewed: previous chart, nursing note and vitals Reviewed previous: labs and ECG Interpretation: labs, CT scan and ECG   Results for orders placed during the hospital encounter of 08/31/14  PHENYTOIN LEVEL, TOTAL      Result Value Ref Range   Phenytoin Lvl <2.5 (*) 10.0 - 20.0 ug/mL   Ct Head Wo Contrast 08/31/2014   CLINICAL DATA:  Larey Seat down in street and unresponsive. Altered mental status. Initial encounter.  EXAM: CT HEAD WITHOUT CONTRAST  CT CERVICAL SPINE WITHOUT CONTRAST  TECHNIQUE: Multidetector CT imaging of the head and cervical spine was performed following the standard protocol without intravenous contrast. Multiplanar CT image reconstructions of the cervical spine were also generated.  COMPARISON:  Head CT 07/24/2011.  FINDINGS: CT HEAD FINDINGS  There is no evidence of acute intracranial hemorrhage, mass lesion, brain edema or extra-axial fluid collection. The ventricles and subarachnoid spaces are appropriately sized for age. There is no CT evidence of acute cortical infarction.  There is some soft tissue swelling in the frontal scalp. The left maxillary, ethmoid  and frontal sinuses are completely opacified without air-fluid levels. There is some right maxillary periodontal disease. The mastoid air cells are clear. The calvarium is intact.  CT CERVICAL SPINE FINDINGS  Examination is mildly motion degraded, especially on the reformatted images. The alignment is normal. There is no evidence acute fracture or traumatic subluxation. Prominent paraspinal osteophytes are present throughout the cervical spine consistent with diffuse idiopathic skeletal hyperostosis. The disc spaces are relatively preserved. No acute soft tissue findings are seen.  IMPRESSION: 1. No acute intracranial or calvarial findings. 2. Diffuse opacification of the left frontal, ethmoid and maxillary sinuses, new compared with prior head CT from 3 years ago. There are some bony changes implying chronic sinusitis. 3. No evidence of acute cervical spine fracture, traumatic subluxation or paraspinal abnormality. Changes of diffuse idiopathic skeletal hyperostosis.   Electronically Signed   By: Roxy Horseman M.D.   On: 08/31/2014 21:40   Ct Cervical Spine Wo Contrast 08/31/2014   CLINICAL DATA:  Larey Seat down in street and unresponsive. Altered mental status. Initial encounter.  EXAM: CT HEAD WITHOUT CONTRAST  CT CERVICAL SPINE WITHOUT CONTRAST  TECHNIQUE: Multidetector CT imaging of the head and cervical spine was performed following the standard protocol without intravenous  contrast. Multiplanar CT image reconstructions of the cervical spine were also generated.  COMPARISON:  Head CT 07/24/2011.  FINDINGS: CT HEAD FINDINGS  There is no evidence of acute intracranial hemorrhage, mass lesion, brain edema or extra-axial fluid collection. The ventricles and subarachnoid spaces are appropriately sized for age. There is no CT evidence of acute cortical infarction.  There is some soft tissue swelling in the frontal scalp. The left maxillary, ethmoid and frontal sinuses are completely opacified without air-fluid levels.  There is some right maxillary periodontal disease. The mastoid air cells are clear. The calvarium is intact.  CT CERVICAL SPINE FINDINGS  Examination is mildly motion degraded, especially on the reformatted images. The alignment is normal. There is no evidence acute fracture or traumatic subluxation. Prominent paraspinal osteophytes are present throughout the cervical spine consistent with diffuse idiopathic skeletal hyperostosis. The disc spaces are relatively preserved. No acute soft tissue findings are seen.  IMPRESSION: 1. No acute intracranial or calvarial findings. 2. Diffuse opacification of the left frontal, ethmoid and maxillary sinuses, new compared with prior head CT from 3 years ago. There are some bony changes implying chronic sinusitis. 3. No evidence of acute cervical spine fracture, traumatic subluxation or paraspinal abnormality. Changes of diffuse idiopathic skeletal hyperostosis.   Electronically Signed   By: Roxy Horseman M.D.   On: 08/31/2014 21:40    2045:  Pt arrived to ED with LSB and c-collar in place.  Multiple ED staff at bedside to log roll pt off LSB while maintaining cervical spinal immobilization.  LSB removed, c-collar remains in place.  2230:  No midline CS tenderness, FROM CS without midline tenderness. No NMS changes.  C-collar removed.  Pt with hx of seizure disorder and medication non-compliance. Dilantin level subtherapeutic today. IV dilantin load given. Pt states he feels he is at his baseline and wants to go home now. Pt has ambulated with steady gait. Dx and testing d/w pt and family.  Questions answered.  Verb understanding, agreeable to d/c home with outpt f/u.   Samuel Jester, DO 09/02/14 1635

## 2014-08-31 NOTE — ED Notes (Signed)
Pt monitored by pulse ox, bp cuff, and 12-lead. 

## 2014-08-31 NOTE — ED Notes (Signed)
Patient returned from CT

## 2016-06-14 ENCOUNTER — Encounter (HOSPITAL_COMMUNITY): Payer: Self-pay | Admitting: *Deleted

## 2016-06-14 ENCOUNTER — Emergency Department (HOSPITAL_COMMUNITY)
Admission: EM | Admit: 2016-06-14 | Discharge: 2016-06-15 | Disposition: A | Discharge status: Home / Self Care | Payer: BC Managed Care – PPO | Attending: Emergency Medicine | Admitting: Emergency Medicine

## 2016-06-14 DIAGNOSIS — Z9114 Patient's other noncompliance with medication regimen: Secondary | ICD-10-CM | POA: Diagnosis not present

## 2016-06-14 DIAGNOSIS — G40909 Epilepsy, unspecified, not intractable, without status epilepticus: Secondary | ICD-10-CM | POA: Diagnosis not present

## 2016-06-14 DIAGNOSIS — Z79899 Other long term (current) drug therapy: Secondary | ICD-10-CM | POA: Diagnosis not present

## 2016-06-14 LAB — CBC
HCT: 37.8 % — ABNORMAL LOW (ref 39.0–52.0)
Hemoglobin: 12.3 g/dL — ABNORMAL LOW (ref 13.0–17.0)
MCH: 30.4 pg (ref 26.0–34.0)
MCHC: 32.5 g/dL (ref 30.0–36.0)
MCV: 93.3 fL (ref 78.0–100.0)
Platelets: 211 10*3/uL (ref 150–400)
RBC: 4.05 MIL/uL — AB (ref 4.22–5.81)
RDW: 13.6 % (ref 11.5–15.5)
WBC: 6.1 10*3/uL (ref 4.0–10.5)

## 2016-06-14 LAB — BASIC METABOLIC PANEL
Anion gap: 10 (ref 5–15)
BUN: 8 mg/dL (ref 6–20)
CALCIUM: 9.3 mg/dL (ref 8.9–10.3)
CHLORIDE: 105 mmol/L (ref 101–111)
CO2: 21 mmol/L — AB (ref 22–32)
Creatinine, Ser: 1 mg/dL (ref 0.61–1.24)
GFR calc non Af Amer: >60 mL/min (ref >60)
Glucose, Bld: 70 mg/dL (ref 65–99)
Potassium: 4 mmol/L (ref 3.5–5.1)
Sodium: 136 mmol/L (ref 135–145)

## 2016-06-14 LAB — CBG MONITORING, ED
GLUCOSE-CAPILLARY: 61 mg/dL — AB (ref 65–99)
Glucose-Capillary: 52 mg/dL — ABNORMAL LOW (ref 65–99)

## 2016-06-14 LAB — PHENYTOIN LEVEL, TOTAL: Phenytoin Lvl: <2.5 ug/mL — ABNORMAL LOW (ref 10.0–20.0)

## 2016-06-14 MED ORDER — PHENYTOIN SODIUM EXTENDED 100 MG PO CAPS: 300.0000 mg | ORAL_CAPSULE | Freq: Every day | ORAL | Status: DC

## 2016-06-14 MED ORDER — SODIUM CHLORIDE 0.9 % IV SOLN
1500.0000 mg | Freq: Once | INTRAVENOUS | Status: AC
  Administered 2016-06-14: 1500 mg via INTRAVENOUS
  Filled 2016-06-14: qty 30

## 2016-06-14 NOTE — Progress Notes (Signed)
CM met with patient to assist with establishing primary care.Patient is agreeable with establishing care with the [facility]. Appt  scheduled to follow up for seizures and establish care on Monday 7/24 at 2pm, patient updated and verbalized understanding, teach back done. CM faxed Dilantin prescription to [facility]. Instructed patient he can pick up the medications tomorrow. He was provided contact information. No further questions or concerns verbalized. No further CM needs identified

## 2016-06-14 NOTE — ED Provider Notes (Signed)
CSN: [id number]     Arrival date & time 06/14/16  1959 History   First MD Initiated Contact with Patient 06/14/16 2201     Chief Complaint  Patient presents with  . Seizures     (Consider location/radiation/quality/duration/timing/severity/associated sxs/prior Treatment) HPI Patient became unresponsive today.Marland Kitchen. He thinks he probably had seizure. He has been out of his Dilantin for several months. He is presently asymptomatic. He states that he can't Dilantin due to lack of funds. No other associated symptoms. Presently asymptomatic. He admits to drinking "one beer earlier today" Past Medical History  Diagnosis Date  . Seizures (HCC)   . Noncompliance with medication regimen    History reviewed. No pertinent past surgical history. Family History  Problem Relation Age of Onset  . Heart disease Mother   . Cancer Neg Hx   . Diabetes Neg Hx   . Stroke Neg Hx    Social History  Substance Use Topics  . Smoking status: Never Smoker   . Smokeless tobacco: Never Used  . Alcohol Use: 4.2 oz/week    7 Cans of beer per week     Comment: occassionally    Review of Systems  Musculoskeletal: Positive for gait problem.       Walks with cane  All other systems reviewed and are negative.     Allergies  Review of patient's allergies indicates no known allergies.  Home Medications   Prior to Admission medications   Medication Sig Start Date End Date Taking? Authorizing Provider  Triprolidine-Pseudoephedrine (ANTIHISTAMINE PO) Take 1 tablet by mouth daily.   Yes Historical Provider, MD  phenytoin (DILANTIN) 100 MG ER capsule Take 1 capsule (100 mg total) by mouth 3 (three) times daily. Patient not taking: Reported on 06/14/2016 08/31/14   Samuel JesterKathleen McManus, DO   BP 144/71 mmHg  Pulse 112  Temp(Src) 98.5 F (36.9 C) (Oral)  Resp 22  Ht 5\' 8"  (1.727 m)  Wt 209 lb 7 oz (95 kg)  BMI 31.85 kg/m2  SpO2 98% Physical Exam  Constitutional: He is oriented to person, place, and time. He  appears well-developed and well-nourished. No distress.  HENT:  Head: Normocephalic and atraumatic.  Eyes: Conjunctivae are normal. Pupils are equal, round, and reactive to light.  Neck: Neck supple. No tracheal deviation present. No thyromegaly present.  Cardiovascular: Normal rate and regular rhythm.   No murmur heard. Pulmonary/Chest: Effort normal and breath sounds normal.  Abdominal: Soft. Bowel sounds are normal. He exhibits no distension. There is no tenderness.  Musculoskeletal: Normal range of motion. He exhibits no edema or tenderness.  Neurological: He is alert and oriented to person, place, and time. No cranial nerve deficit. Coordination normal.  Moves all extremities well. Glasgow Coma Score 15.  Skin: Skin is warm and dry. No rash noted.  Psychiatric: He has a normal mood and affect.  Nursing note and vitals reviewed.   ED Course  Procedures (including critical care time) Labs Review Labs Reviewed  PHENYTOIN LEVEL, TOTAL - Abnormal; Notable for the following:    Phenytoin Lvl <2.5 (*)    All other components within normal limits  BASIC METABOLIC PANEL - Abnormal; Notable for the following:    CO2 21 (*)    All other components within normal limits  CBC - Abnormal; Notable for the following:    RBC 4.05 (*)    Hemoglobin 12.3 (*)    HCT 37.8 (*)    All other components within normal limits  CBG MONITORING, ED - Abnormal;  Notable for the following:    Glucose-Capillary 52 (*)    All other components within normal limits  CBG MONITORING, ED - Abnormal; Notable for the following:    Glucose-Capillary 61 (*)    All other components within normal limits    Imaging Review No results found. I have personally reviewed and evaluated these images and lab results as part of my medical decision-making.   EKG Interpretation   Date/Time:  Wednesday June 14 2016 20:00:57 EDT Ventricular Rate:  103 PR Interval:    QRS Duration: 150 QT Interval:  395 QTC Calculation:  518 R Axis:   76 Text Interpretation:  Sinus tachycardia Right bundle branch block Inferior  infarct, acute Lateral leads are also involved No significant change since  last tracing Confirmed by Vernessa Likes  MD, Jolan Mealor [id number]) on 06/14/2016 9:05:26  PM     Correction there is no evidence of acute MI on EKG  12:45 AM patient resting comfortably. No distress. Being loaded with intravenous Dilantin. Results for orders placed or performed during the hospital encounter of 06/14/16  Dilantin (phenytoin) Level (if patient is taking this medication)  Result Value Ref Range   Phenytoin Lvl <2.5 (L) 10.0 - 20.0 ug/mL  Basic metabolic panel - if new onset seizures  Result Value Ref Range   Sodium 136 135 - 145 mmol/L   Potassium 4.0 3.5 - 5.1 mmol/L   Chloride 105 101 - 111 mmol/L   CO2 21 (L) 22 - 32 mmol/L   Glucose, Bld 70 65 - 99 mg/dL   BUN 8 6 - 20 mg/dL   Creatinine, Ser 6.04 0.61 - 1.24 mg/dL   Calcium 9.3 8.9 - 54.0 mg/dL   GFR calc non Af Amer >60 >60 mL/min   GFR calc Af Amer >60 >60 mL/min   Anion gap 10 5 - 15  CBC - if new onset seizures  Result Value Ref Range   WBC 6.1 4.0 - 10.5 K/uL   RBC 4.05 (L) 4.22 - 5.81 MIL/uL   Hemoglobin 12.3 (L) 13.0 - 17.0 g/dL   HCT 98.1 (L) 19.1 - 47.8 %   MCV 93.3 78.0 - 100.0 fL   MCH 30.4 26.0 - 34.0 pg   MCHC 32.5 30.0 - 36.0 g/dL   RDW 29.5 62.1 - 30.8 %   Platelets 211 150 - 400 K/uL  CBG monitoring, ED  Result Value Ref Range   Glucose-Capillary 52 (L) 65 - 99 mg/dL  CBG monitoring, ED  Result Value Ref Range   Glucose-Capillary 61 (L) 65 - 99 mg/dL   No results found.  MDM  Dilantin intravenous load ordered, after consultation with hospital pharmacist Final diagnoses:  None  Care management evaluated patient. An appointment has been scheduled for him at [facility] for Monday, July 24 at 2 PM. I have also written a prescription for him for one month Dilantin 300 mg daily at bedtime which he can get  filled tomorrow at the [facility] free of charge.pt signed out to Dr.Wickline .  At 1250 am pending dilantin load Diagnosis #1 seizure disorder #2 medication noncompliance    Doug Sou, MD 06/15/16 [phone]

## 2016-06-14 NOTE — ED Notes (Signed)
Patient states he is feeling fine at this time.

## 2016-06-14 NOTE — ED Notes (Signed)
CBG 52. RN notified.

## 2016-06-14 NOTE — ED Notes (Signed)
Juice, graham crackers and peanut butter given  Seizure pads are on bed, yellow armband and socks applied

## 2016-06-14 NOTE — ED Notes (Signed)
Patient states the only meds he takes is over the counter for his allergies

## 2016-06-14 NOTE — ED Notes (Signed)
Patient presents via EMS.  Patient was found on the ground by someone walking and they said he was unresponsive.  By the time fire got there he was becoming more alert but slightly confused  No incontinence, tongue injury, etc

## 2016-06-15 ENCOUNTER — Telehealth: Payer: Self-pay | Admitting: *Deleted

## 2016-06-15 LAB — CBG MONITORING, ED: GLUCOSE-CAPILLARY: 91 mg/dL (ref 65–99)

## 2016-06-15 MED FILL — PHENYTOIN SOD EXT 100 MG CA: 100 | 30 days supply | Qty: 90 | Fill #0

## 2016-06-15 NOTE — ED Provider Notes (Signed)
Pt feels improved He ambulated without difficulty   Zadie Rhineonald Novak Stgermaine, MD 06/15/16 [phone]

## 2016-06-15 NOTE — ED Notes (Signed)
Discharge instructions, prescription and taxi voucher for patient.  Reviewed the need to go to MD appointment Monday at 2pm and to get prescription filled in the AM.  [facility]

## 2016-06-15 NOTE — ED Provider Notes (Signed)
Pt stable Glucose improved Will d/c home   [name], MD 06/15/16 [phone]

## 2016-06-15 NOTE — Discharge Instructions (Signed)
[facility] to get your prescription for Dilantin filled. Keep your scheduled appointment to see the doctor on Monday, July 24,2107 Epilepsy People with epilepsy have times when they shake and jerk uncontrollably (seizures). This happens when there is a sudden change in brain function. Epilepsy may have many possible causes. Anything that disturbs the normal pattern of brain cell activity can lead to seizures. HOME CARE   Follow your doctor's instructions about driving and safety during normal activities.  Get enough sleep.  Only take medicine as told by your doctor.  Avoid things that you know can cause you to have seizures (triggers).  Write down when your seizures happen and what you remember about each seizure. Write down anything you think may have caused the seizure to happen.  Tell the people you live and work with that you have seizures. Make sure they know how to help you. They should:  Cushion your head and body.  Turn you on your side.  Not restrain you.  Not place anything inside your mouth.  Call for local emergency medical help if there is any question about what has happened.  Keep all follow-up visits with your doctor. This is very important. GET HELP IF:  You get an infection or start to feel sick. You may have more seizures when you are sick.  You are having seizures more often.  Your seizure pattern is changing. GET HELP RIGHT AWAY IF:   A seizure does not stop after a few seconds or minutes.  A seizure causes you to have trouble breathing.  A seizure gives you a very bad headache.  A seizure makes you unable to speak or use a part of your body.   This information is not intended to replace advice given to you by your health care provider. Make sure you discuss any questions you have with your health care provider.   Document Released: 09/10/2009 Document Revised: 09/03/2013 Document Reviewed:  06/25/2013 Elsevier Interactive Patient Education 2016 [employer]  at 2 PM

## 2016-06-19 ENCOUNTER — Inpatient Hospital Stay: Payer: BC Managed Care – PPO | Admitting: Family Medicine

## 2016-12-09 ENCOUNTER — Emergency Department (HOSPITAL_COMMUNITY)
Admission: EM | Admit: 2016-12-09 | Discharge: 2016-12-09 | Disposition: A | Discharge status: Home / Self Care | Payer: BC Managed Care – PPO | Attending: Emergency Medicine | Admitting: Emergency Medicine

## 2016-12-09 ENCOUNTER — Encounter (HOSPITAL_COMMUNITY): Payer: Self-pay | Admitting: *Deleted

## 2016-12-09 ENCOUNTER — Emergency Department (HOSPITAL_COMMUNITY): Payer: BC Managed Care – PPO

## 2016-12-09 DIAGNOSIS — W19XXXA Unspecified fall, initial encounter: Secondary | ICD-10-CM | POA: Diagnosis not present

## 2016-12-09 DIAGNOSIS — Y939 Activity, unspecified: Secondary | ICD-10-CM | POA: Diagnosis not present

## 2016-12-09 DIAGNOSIS — Y999 Unspecified external cause status: Secondary | ICD-10-CM | POA: Insufficient documentation

## 2016-12-09 DIAGNOSIS — S0181XA Laceration without foreign body of other part of head, initial encounter: Secondary | ICD-10-CM | POA: Diagnosis not present

## 2016-12-09 DIAGNOSIS — S0993XA Unspecified injury of face, initial encounter: Secondary | ICD-10-CM | POA: Diagnosis present

## 2016-12-09 DIAGNOSIS — R4182 Altered mental status, unspecified: Secondary | ICD-10-CM | POA: Insufficient documentation

## 2016-12-09 DIAGNOSIS — F10121 Alcohol abuse with intoxication delirium: Secondary | ICD-10-CM | POA: Diagnosis not present

## 2016-12-09 DIAGNOSIS — Y929 Unspecified place or not applicable: Secondary | ICD-10-CM | POA: Insufficient documentation

## 2016-12-09 DIAGNOSIS — F10921 Alcohol use, unspecified with intoxication delirium: Secondary | ICD-10-CM

## 2016-12-09 LAB — COMPREHENSIVE METABOLIC PANEL
ALT: 18 U/L (ref 17–63)
ANION GAP: 9 (ref 5–15)
AST: 27 U/L (ref 15–41)
Albumin: 3.2 g/dL — ABNORMAL LOW (ref 3.5–5.0)
Alkaline Phosphatase: 56 U/L (ref 38–126)
BUN: 9 mg/dL (ref 6–20)
CHLORIDE: 100 mmol/L — AB (ref 101–111)
CO2: 25 mmol/L (ref 22–32)
Calcium: 8.8 mg/dL — ABNORMAL LOW (ref 8.9–10.3)
Creatinine, Ser: 1.02 mg/dL (ref 0.61–1.24)
Glucose, Bld: 86 mg/dL (ref 65–99)
Potassium: 3.3 mmol/L — ABNORMAL LOW (ref 3.5–5.1)
Sodium: 134 mmol/L — ABNORMAL LOW (ref 135–145)
Total Bilirubin: 0.5 mg/dL (ref 0.3–1.2)
Total Protein: 7.2 g/dL (ref 6.5–8.1)

## 2016-12-09 LAB — URINALYSIS, ROUTINE W REFLEX MICROSCOPIC
BILIRUBIN URINE: NEGATIVE
Glucose, UA: NEGATIVE mg/dL
Hgb urine dipstick: NEGATIVE
Ketones, ur: NEGATIVE mg/dL
Leukocytes, UA: NEGATIVE
NITRITE: NEGATIVE
Protein, ur: NEGATIVE mg/dL
SPECIFIC GRAVITY, URINE: 1.003 — AB (ref 1.005–1.030)
pH: 6 (ref 5.0–8.0)

## 2016-12-09 LAB — CBC
HCT: 36.3 % — ABNORMAL LOW (ref 39.0–52.0)
HEMOGLOBIN: 11.9 g/dL — AB (ref 13.0–17.0)
MCH: 30 pg (ref 26.0–34.0)
MCHC: 32.8 g/dL (ref 30.0–36.0)
MCV: 91.4 fL (ref 78.0–100.0)
PLATELETS: 253 10*3/uL (ref 150–400)
RBC: 3.97 MIL/uL — AB (ref 4.22–5.81)
RDW: 13.2 % (ref 11.5–15.5)
WBC: 6.4 10*3/uL (ref 4.0–10.5)

## 2016-12-09 LAB — ETHANOL: Alcohol, Ethyl (B): 379 mg/dL (ref <5)

## 2016-12-09 LAB — CBG MONITORING, ED: Glucose-Capillary: 83 mg/dL (ref 65–99)

## 2016-12-09 NOTE — ED Notes (Signed)
Pt urinated in corner of room.

## 2016-12-09 NOTE — ED Notes (Signed)
Patient Alert and oriented X4. Stable and ambulatory. Patient verbalized understanding of the discharge instructions.  Patient belongings were taken by the patient.  

## 2016-12-09 NOTE — ED Triage Notes (Signed)
Pt here from home via [facility] for unwitnessed fall. Hx of etoh and seizures.  [facility] states pt found on floor with lac to L eyebrow, incontinent of urine.  Alert to self only.  ETOH all over house and pt states he did drink last night.  cbg 82, bp 124/90, hr 60, 100 RA.

## 2016-12-09 NOTE — ED Notes (Signed)
Assisted pt with urinal.  Pt able to follow commands.

## 2016-12-09 NOTE — ED Provider Notes (Signed)
[facility] DEPT Provider Note   CSN: [id number] Arrival date & time: 12/09/16  1135     History   Chief Complaint Chief Complaint  Patient presents with  . Fall  . Altered Mental Status    HPI Mason Hart is a 61 y.o. male.  HPI Patient presents after an unwitnessed fall. The patient lives at home, and per report the patient's girlfriend found him on the ground, just prior to calling EMS. Patient himself is altered to place, self, but is essentially nonverbal, interacting minimally, is listless, level V caveat secondary to mental status.  Past Medical History:  Diagnosis Date  . Noncompliance with medication regimen   . Seizures (HCC)     There are no active problems to display for this patient.   History reviewed. No pertinent surgical history.     Home Medications    Prior to Admission medications   Medication Sig Start Date End Date Taking? Authorizing Provider  phenytoin (DILANTIN) 100 MG ER capsule Take 3 capsules (300 mg total) by mouth at bedtime. 06/14/16   Doug SouSam Jacubowitz, MD  Triprolidine-Pseudoephedrine (ANTIHISTAMINE PO) Take 1 tablet by mouth daily.    Historical Provider, MD    Family History Family History  Problem Relation Age of Onset  . Heart disease Mother   . Cancer Neg Hx   . Diabetes Neg Hx   . Stroke Neg Hx     Social History Social History  Substance Use Topics  . Smoking status: Never Smoker  . Smokeless tobacco: Never Used  . Alcohol use 4.2 oz/week    7 Cans of beer per week     Comment: occassionally     Allergies   Patient has no known allergies.   Review of Systems Review of Systems  Unable to perform ROS: Mental status change     Physical Exam Updated Vital Signs BP 106/61 (BP Location: Right Arm)   Pulse 72   Temp 97.3 F (36.3 C) (Rectal)   Resp 18   Wt 209 lb (94.8 kg)   SpO2 100%   BMI 31.78 kg/m   Physical Exam  Constitutional:  Obese elderly appearing male minimally interactive, easily  agitated  HENT:  Head: Normocephalic.    Eyes: Conjunctivae and EOM are normal.  Neck: Neck supple.  Cardiovascular: Normal rate and regular rhythm.   Pulmonary/Chest: Effort normal. No stridor. No respiratory distress.  Abdominal: He exhibits no distension.  Musculoskeletal: He exhibits no edema.  Neurological: He is alert.  Skin: Skin is warm and dry.  Psychiatric: Cognition and memory are impaired.  Nursing note and vitals reviewed.    ED Treatments / Results  Labs (all labs ordered are listed, but only abnormal results are displayed) Labs Reviewed  COMPREHENSIVE METABOLIC PANEL - Abnormal; Notable for the following:       Result Value   Sodium 134 (*)    Potassium 3.3 (*)    Chloride 100 (*)    Calcium 8.8 (*)    Albumin 3.2 (*)    All other components within normal limits  CBC - Abnormal; Notable for the following:    RBC 3.97 (*)    Hemoglobin 11.9 (*)    HCT 36.3 (*)    All other components within normal limits  ETHANOL - Abnormal; Notable for the following:    Alcohol, Ethyl (B) 379 (*)    All other components within normal limits  URINALYSIS, ROUTINE W REFLEX MICROSCOPIC - Abnormal; Notable for the following:  Color, Urine STRAW (*)    Specific Gravity, Urine 1.003 (*)    All other components within normal limits  CBG MONITORING, ED    Radiology Ct Head Wo Contrast  Result Date: 12/09/2016 CLINICAL DATA:  Pt had an unwitnessed fall. Hx of etoh and seizures. pt found on floor with lac to L eyebrow, EXAM: CT HEAD WITHOUT CONTRAST CT MAXILLOFACIAL WITHOUT CONTRAST CT CERVICAL SPINE WITHOUT CONTRAST TECHNIQUE: Multidetector CT imaging of the head, cervical spine, and maxillofacial structures were performed using the standard protocol without intravenous contrast. Multiplanar CT image reconstructions of the cervical spine and maxillofacial structures were also generated. COMPARISON:  08/31/2014 FINDINGS: CT HEAD FINDINGS Brain: Diffuse parenchymal atrophy. Patchy  areas of hypoattenuation in deep and periventricular white matter bilaterally. Negative for acute intracranial hemorrhage, mass lesion, acute infarction, midline shift, or mass-effect. Acute infarct may be inapparent on noncontrast CT. Ventricles and sulci symmetric. Vascular: No hyperdense vessel or unexpected calcification. Skull: Bone windows demonstrate no focal lesion. Other: Left frontal scalp soft tissue swelling CT MAXILLOFACIAL FINDINGS Osseous: Old right maxillary fracture deformity with postop change. No acute fracture. No mandibular dislocation. No worrisome bone lesion. Orbits: Negative. No traumatic or inflammatory finding. Sinuses: Dense opacification of left frontal, maxillary, and ethmoid sinuses as before. Mastoid air cells remain well aerated. Soft tissues: Left frontal scalp soft tissue swelling CT CERVICAL SPINE FINDINGS Alignment: Normal. Skull base and vertebrae: No acute fracture. No primary bone lesion or focal pathologic process. Soft tissues and spinal canal: No prevertebral fluid or swelling. No visible canal hematoma. Disc levels: Exuberant anterior endplate spurring Z6-X0 without significant disc narrowing. Mild narrowing C6-7. Posterior protrusions C5-6, C6-7, C7-T1. Upper chest: Negative. Other: None IMPRESSION: 1. Negative for bleed or other acute intracranial process. 2. Mild atrophy and nonspecific white matter changes. 3. Left frontal scalp soft tissue swelling without acute maxillofacial fracture. 4. Chronic opacification of left frontal, ethmoid, and maxillary sinuses. 5. No cervical fracture , dislocation, or other acute finding. 6. Cervical degenerative changes as enumerated above. Electronically Signed   By: Corlis Leak M.D.   On: 12/09/2016 14:27   Ct Cervical Spine Wo Contrast  Result Date: 12/09/2016 CLINICAL DATA:  Pt had an unwitnessed fall. Hx of etoh and seizures. pt found on floor with lac to L eyebrow, EXAM: CT HEAD WITHOUT CONTRAST CT MAXILLOFACIAL WITHOUT  CONTRAST CT CERVICAL SPINE WITHOUT CONTRAST TECHNIQUE: Multidetector CT imaging of the head, cervical spine, and maxillofacial structures were performed using the standard protocol without intravenous contrast. Multiplanar CT image reconstructions of the cervical spine and maxillofacial structures were also generated. COMPARISON:  08/31/2014 FINDINGS: CT HEAD FINDINGS Brain: Diffuse parenchymal atrophy. Patchy areas of hypoattenuation in deep and periventricular white matter bilaterally. Negative for acute intracranial hemorrhage, mass lesion, acute infarction, midline shift, or mass-effect. Acute infarct may be inapparent on noncontrast CT. Ventricles and sulci symmetric. Vascular: No hyperdense vessel or unexpected calcification. Skull: Bone windows demonstrate no focal lesion. Other: Left frontal scalp soft tissue swelling CT MAXILLOFACIAL FINDINGS Osseous: Old right maxillary fracture deformity with postop change. No acute fracture. No mandibular dislocation. No worrisome bone lesion. Orbits: Negative. No traumatic or inflammatory finding. Sinuses: Dense opacification of left frontal, maxillary, and ethmoid sinuses as before. Mastoid air cells remain well aerated. Soft tissues: Left frontal scalp soft tissue swelling CT CERVICAL SPINE FINDINGS Alignment: Normal. Skull base and vertebrae: No acute fracture. No primary bone lesion or focal pathologic process. Soft tissues and spinal canal: No prevertebral fluid or swelling. No visible  canal hematoma. Disc levels: Exuberant anterior endplate spurring V2-Z3 without significant disc narrowing. Mild narrowing C6-7. Posterior protrusions C5-6, C6-7, C7-T1. Upper chest: Negative. Other: None IMPRESSION: 1. Negative for bleed or other acute intracranial process. 2. Mild atrophy and nonspecific white matter changes. 3. Left frontal scalp soft tissue swelling without acute maxillofacial fracture. 4. Chronic opacification of left frontal, ethmoid, and maxillary sinuses. 5.  No cervical fracture , dislocation, or other acute finding. 6. Cervical degenerative changes as enumerated above. Electronically Signed   By: Corlis Leak M.D.   On: 12/09/2016 14:27   Ct Maxillofacial Wo Cm  Result Date: 12/09/2016 CLINICAL DATA:  Pt had an unwitnessed fall. Hx of etoh and seizures. pt found on floor with lac to L eyebrow, EXAM: CT HEAD WITHOUT CONTRAST CT MAXILLOFACIAL WITHOUT CONTRAST CT CERVICAL SPINE WITHOUT CONTRAST TECHNIQUE: Multidetector CT imaging of the head, cervical spine, and maxillofacial structures were performed using the standard protocol without intravenous contrast. Multiplanar CT image reconstructions of the cervical spine and maxillofacial structures were also generated. COMPARISON:  08/31/2014 FINDINGS: CT HEAD FINDINGS Brain: Diffuse parenchymal atrophy. Patchy areas of hypoattenuation in deep and periventricular white matter bilaterally. Negative for acute intracranial hemorrhage, mass lesion, acute infarction, midline shift, or mass-effect. Acute infarct may be inapparent on noncontrast CT. Ventricles and sulci symmetric. Vascular: No hyperdense vessel or unexpected calcification. Skull: Bone windows demonstrate no focal lesion. Other: Left frontal scalp soft tissue swelling CT MAXILLOFACIAL FINDINGS Osseous: Old right maxillary fracture deformity with postop change. No acute fracture. No mandibular dislocation. No worrisome bone lesion. Orbits: Negative. No traumatic or inflammatory finding. Sinuses: Dense opacification of left frontal, maxillary, and ethmoid sinuses as before. Mastoid air cells remain well aerated. Soft tissues: Left frontal scalp soft tissue swelling CT CERVICAL SPINE FINDINGS Alignment: Normal. Skull base and vertebrae: No acute fracture. No primary bone lesion or focal pathologic process. Soft tissues and spinal canal: No prevertebral fluid or swelling. No visible canal hematoma. Disc levels: Exuberant anterior endplate spurring G6-Y4 without  significant disc narrowing. Mild narrowing C6-7. Posterior protrusions C5-6, C6-7, C7-T1. Upper chest: Negative. Other: None IMPRESSION: 1. Negative for bleed or other acute intracranial process. 2. Mild atrophy and nonspecific white matter changes. 3. Left frontal scalp soft tissue swelling without acute maxillofacial fracture. 4. Chronic opacification of left frontal, ethmoid, and maxillary sinuses. 5. No cervical fracture , dislocation, or other acute finding. 6. Cervical degenerative changes as enumerated above. Electronically Signed   By: Corlis Leak M.D.   On: 12/09/2016 14:27    Procedures Procedures (including critical care time)  Medications Ordered in ED Medications - No data to display   Initial Impression / Assessment and Plan / ED Course  I have reviewed the triage vital signs and the nursing notes.  Pertinent labs & imaging results that were available during my care of the patient were reviewed by me and considered in my medical decision making (see chart for details).  Clinical Course     Not long after the initial interview the patient attempted to stand from the foot the bed, and fell to the ground. On exam he is able to get back to bearing weight, standing, has no new complaints, but again is minimally interactive. No gross deformities.  etoh 379   2:52 PM Patient sleeping. With reassuring radiographic findings, the patient will require some time for treatment of sobriety, but without evidence for discrete hemorrhage, fracture, and with evidence for acute intoxication, patient likely to be discharged with acute intoxication,  fall. Final Clinical Impressions(s) / ED Diagnoses  Acute alcohol intoxication Fall Facial laceration  1/14 chart addendum - patient awakened, was alert, ambulatory, d/c    Gerhard Munch, MD 12/10/16 0830

## 2016-12-09 NOTE — Discharge Instructions (Signed)
As discussed, your evaluation today has been largely reassuring.  But, it is important that you monitor your condition carefully, and do not hesitate to return to the ED if you develop new, or concerning changes in your condition.  Otherwise, please follow-up with your physician for appropriate ongoing care.  Please monitor the wound on your face, and use topical antibiotics for wound care.  Please drink only in moderation.

## 2016-12-09 NOTE — ED Notes (Signed)
Changed urine soaked sheets.

## 2016-12-09 NOTE — ED Notes (Signed)
Pt stood up to urinate and fell. MD at bedside.  Pt lifted back to bed.  No injuries noted.

## 2017-04-26 ENCOUNTER — Encounter (HOSPITAL_COMMUNITY): Payer: Self-pay | Admitting: Emergency Medicine

## 2017-04-26 ENCOUNTER — Emergency Department (HOSPITAL_COMMUNITY)
Admission: EM | Admit: 2017-04-26 | Discharge: 2017-04-27 | Disposition: A | Discharge status: Home / Self Care | Payer: BC Managed Care – PPO | Attending: Emergency Medicine | Admitting: Emergency Medicine

## 2017-04-26 DIAGNOSIS — Z79899 Other long term (current) drug therapy: Secondary | ICD-10-CM | POA: Insufficient documentation

## 2017-04-26 DIAGNOSIS — R569 Unspecified convulsions: Secondary | ICD-10-CM | POA: Insufficient documentation

## 2017-04-26 LAB — COMPREHENSIVE METABOLIC PANEL
ALT: 35 U/L (ref 17–63)
ANION GAP: 4 — AB (ref 5–15)
AST: 65 U/L — AB (ref 15–41)
Albumin: 3 g/dL — ABNORMAL LOW (ref 3.5–5.0)
Alkaline Phosphatase: 75 U/L (ref 38–126)
BILIRUBIN TOTAL: 1.5 mg/dL — AB (ref 0.3–1.2)
BUN: 10 mg/dL (ref 6–20)
CO2: 27 mmol/L (ref 22–32)
Calcium: 8.6 mg/dL — ABNORMAL LOW (ref 8.9–10.3)
Chloride: 102 mmol/L (ref 101–111)
Creatinine, Ser: 1.02 mg/dL (ref 0.61–1.24)
GFR calc Af Amer: >60 mL/min (ref >60)
Glucose, Bld: 102 mg/dL — ABNORMAL HIGH (ref 65–99)
POTASSIUM: 5.7 mmol/L — AB (ref 3.5–5.1)
Sodium: 133 mmol/L — ABNORMAL LOW (ref 135–145)
TOTAL PROTEIN: 6.4 g/dL — AB (ref 6.5–8.1)

## 2017-04-26 LAB — CBC WITH DIFFERENTIAL/PLATELET
Basophils Absolute: 0 10*3/uL (ref 0.0–0.1)
Basophils Relative: 0 %
Eosinophils Absolute: 0.1 10*3/uL (ref 0.0–0.7)
Eosinophils Relative: 3 %
HEMATOCRIT: 33.5 % — AB (ref 39.0–52.0)
Hemoglobin: 10.9 g/dL — ABNORMAL LOW (ref 13.0–17.0)
LYMPHS ABS: 1.6 10*3/uL (ref 0.7–4.0)
LYMPHS PCT: 32 %
MCH: 30.3 pg (ref 26.0–34.0)
MCHC: 32.5 g/dL (ref 30.0–36.0)
MCV: 93.1 fL (ref 78.0–100.0)
MONO ABS: 0.4 10*3/uL (ref 0.1–1.0)
MONOS PCT: 7 %
NEUTROS ABS: 2.9 10*3/uL (ref 1.7–7.7)
Neutrophils Relative %: 58 %
Platelets: 247 10*3/uL (ref 150–400)
RBC: 3.6 MIL/uL — ABNORMAL LOW (ref 4.22–5.81)
RDW: 13.9 % (ref 11.5–15.5)
WBC: 5.1 10*3/uL (ref 4.0–10.5)

## 2017-04-26 LAB — RAPID URINE DRUG SCREEN, HOSP PERFORMED
AMPHETAMINES: NOT DETECTED
Barbiturates: NOT DETECTED
Benzodiazepines: NOT DETECTED
Cocaine: NOT DETECTED
OPIATES: NOT DETECTED
Tetrahydrocannabinol: NOT DETECTED

## 2017-04-26 LAB — CBG MONITORING, ED: Glucose-Capillary: 86 mg/dL (ref 65–99)

## 2017-04-26 LAB — ETHANOL: Alcohol, Ethyl (B): <5 mg/dL (ref <5)

## 2017-04-26 LAB — PHENYTOIN LEVEL, TOTAL

## 2017-04-26 NOTE — ED Provider Notes (Signed)
[facility] Provider Note   CSN: [id number] Arrival date & time: 04/26/17  2210     History   Chief Complaint Chief Complaint  Patient presents with  . Altered Mental Status    HPI Mason Hart is a 61 y.o. male.  The history is provided by the EMS personnel.  Seizures   This is a recurrent problem. The current episode started less than 1 hour ago. The problem has been resolved. There was 1 seizure. Duration: unknown but complete by the time EMS arrived. Associated symptoms include sleepiness. Pertinent negatives include no speech difficulty, no visual disturbance, no neck stiffness, no cough, no nausea and no vomiting. Characteristics include rhythmic jerking and loss of consciousness. Characteristics do not include bit tongue. The episode was witnessed. There was no sensation of an aura present. The seizures did not continue in the ED. The seizure(s) had no focality. Possible causes include missed seizure meds. Possible causes do not include sleep deprivation, recent illness or change in alcohol use. has not taken dilantin for awhile There has been no fever. There were no medications administered prior to arrival.    Past Medical History:  Diagnosis Date  . Noncompliance with medication regimen   . Seizures (HCC)     There are no active problems to display for this patient.   History reviewed. No pertinent surgical history.     Home Medications    Prior to Admission medications   Medication Sig Start Date End Date Taking? Authorizing Provider  phenytoin (DILANTIN) 100 MG ER capsule Take 3 capsules (300 mg total) by mouth at bedtime. 06/14/16   Doug SouJacubowitz, Sam, MD  Triprolidine-Pseudoephedrine (ANTIHISTAMINE PO) Take 1 tablet by mouth daily.    [provider]    Family History Family History  Problem Relation Age of Onset  . Heart disease Mother   . Cancer Neg Hx   . Diabetes Neg Hx   . Stroke Neg Hx     Social History Social History    Substance Use Topics  . Smoking status: Never Smoker  . Smokeless tobacco: Never Used  . Alcohol use 4.2 oz/week    7 Cans of beer per week     Comment: occassionally     Allergies   Patient has no known allergies.   Review of Systems Review of Systems  Eyes: Negative for visual disturbance.  Respiratory: Negative for cough.   Gastrointestinal: Negative for nausea and vomiting.  Neurological: Positive for seizures and loss of consciousness. Negative for speech difficulty.  All other systems reviewed and are negative.    Physical Exam Updated Vital Signs BP (!) 136/93 (BP Location: Right Arm)   Pulse (!) 109   Temp 98.5 F (36.9 C) (Oral)   Resp 18   SpO2 100%   Physical Exam  Constitutional: He is oriented to person, place, and time. He appears well-developed and well-nourished. No distress.  HENT:  Head: Normocephalic and atraumatic.  Mouth/Throat: Oropharynx is clear and moist.  Eyes: Conjunctivae and EOM are normal. Pupils are equal, round, and reactive to light.  Neck: Normal range of motion. Neck supple.  Cardiovascular: Normal rate, regular rhythm and intact distal pulses.   No murmur heard. Pulmonary/Chest: Effort normal and breath sounds normal. No respiratory distress. He has no wheezes. He has no rales.  Abdominal: Soft. He exhibits no distension. There is no tenderness. There is no rebound and no guarding.  Musculoskeletal: Normal range of motion. He exhibits no edema or tenderness.  Neurological: He  is alert and oriented to person, place, and time. He has normal strength. No sensory deficit.  Skin: Skin is warm and dry. No rash noted. No erythema.  Psychiatric: He has a normal mood and affect. His behavior is normal.  Nursing note and vitals reviewed.    ED Treatments / Results  Labs (all labs ordered are listed, but only abnormal results are displayed) Labs Reviewed  CBC WITH DIFFERENTIAL/PLATELET  COMPREHENSIVE METABOLIC PANEL  RAPID URINE DRUG  SCREEN, HOSP PERFORMED  ETHANOL  PHENYTOIN LEVEL, TOTAL  CBG MONITORING, ED    EKG  EKG Interpretation  Date/Time:  Thursday Apr 26 2017 22:18:05 EDT Ventricular Rate:  84 PR Interval:    QRS Duration: 149 QT Interval:  423 QTC Calculation: 501 R Axis:   71 Text Interpretation:  Sinus rhythm Right bundle branch block No significant change since last tracing Confirmed by Gwyneth Sprout ([id number]) on 04/26/2017 10:21:25 PM       Radiology No results found.  Procedures Procedures (including critical care time)  Medications Ordered in ED Medications - No data to display   Initial Impression / Assessment and Plan / ED Course  I have reviewed the triage vital signs and the nursing notes.  Pertinent labs & imaging results that were available during my care of the patient were reviewed by me and considered in my medical decision making (see chart for details).     Patient with prior history of seizure disorder. Patient is currently not taking Dilantin and had what sounds like a seizure today. Patient has no post ictal symptoms currently. He denies any recent illness. He states he drank one beer today but does not drink heavily. He denies any unilateral numbness or weakness. No headache. The seizure was witnessed and they denied any injury. Labs pending but EKG is unchanged.  Assuming labs wee normal. Feel that patient will be safe for discharge but will need a prescription for Dilantin. He was given initial dose here  Final Clinical Impressions(s) / ED Diagnoses   Final diagnoses:  Seizure [facility])    New Prescriptions New Prescriptions   No medications on file     Gwyneth Sprout, MD 04/26/17 2303

## 2017-04-26 NOTE — ED Triage Notes (Signed)
Per EMS, pt lives at home with a roommate, per roommate pt had a syncopal episode with grand mal seizure like activity. Pt A&O x 1 initially with EMS, A&O x 3 at this time, unable to recall the year. CBG-121, BP-182/105, HR-80

## 2017-04-26 NOTE — ED Triage Notes (Signed)
Pt denies seizure history.

## 2017-04-27 LAB — URINALYSIS, ROUTINE W REFLEX MICROSCOPIC
BACTERIA UA: NONE SEEN
BILIRUBIN URINE: NEGATIVE
Glucose, UA: NEGATIVE mg/dL
HGB URINE DIPSTICK: NEGATIVE
Ketones, ur: NEGATIVE mg/dL
LEUKOCYTES UA: NEGATIVE
NITRITE: NEGATIVE
Protein, ur: 30 mg/dL — AB
SPECIFIC GRAVITY, URINE: 1.019 (ref 1.005–1.030)
pH: 6 (ref 5.0–8.0)

## 2017-04-27 LAB — POTASSIUM: POTASSIUM: 3.2 mmol/L — AB (ref 3.5–5.1)

## 2017-04-27 MED ORDER — SODIUM CHLORIDE 0.9 % IV BOLUS (SEPSIS)
1000.0000 mL | Freq: Once | INTRAVENOUS | Status: AC
Start: 2017-04-27 — End: 2017-04-27
  Administered 2017-04-27: 1000 mL via INTRAVENOUS

## 2017-04-27 MED ORDER — SODIUM CHLORIDE 0.9 % IV SOLN
1000.0000 mg | Freq: Once | INTRAVENOUS | Status: AC
  Administered 2017-04-27: 1000 mg via INTRAVENOUS
  Filled 2017-04-27: qty 20

## 2017-04-27 MED ORDER — PHENYTOIN SODIUM EXTENDED 100 MG PO CAPS: 300.0000 mg | ORAL_CAPSULE | Freq: Every day | ORAL | 0 refills | Status: DC

## 2017-04-27 MED ORDER — SODIUM POLYSTYRENE SULFONATE 15 GM/60ML PO SUSP
15.0000 g | Freq: Once | ORAL | Status: AC
  Administered 2017-04-27: 15 g via ORAL
  Filled 2017-04-27: qty 60

## 2017-04-27 NOTE — ED Provider Notes (Signed)
12:00 AM  Assumed care from Dr. Anitra LauthPlunkett.  Patient is a 61 year old male with history of epilepsy he was previously on Dilantin but has a history of medical noncompliance who had a seizure today. Patient has no current complaints is currently neurologically intact. Labs, urinalysis pending.   1:30 AM  Pt's labs unremarkable other than potassium of 5.7 with no hemolysis. Normal creatinine. We'll give Kayexalate and IV fluids and recheck. EKG shows no abnormalities compared to previous. His Dilantin level is subtherapeutic. Discussed with pharmacist who recommends loading 1 g IV Dilantin now.   EKG Interpretation  Date/Time:  Thursday Apr 26 2017 22:18:05 EDT Ventricular Rate:  84 PR Interval:    QRS Duration: 149 QT Interval:  423 QTC Calculation: 501 R Axis:   71 Text Interpretation:  Sinus rhythm Right bundle branch block No significant change since last tracing Confirmed by Gwyneth SproutPlunkett, Whitney ([id number]) on 04/26/2017 10:21:25 PM      3:15 AM  Pt's Repeat potassium is 3.2. We'll discharge home. We'll discharge with prescription for his Dilantin and neurology follow-up. Still neurologically intact, alert and oriented. No further seizure activity.  At this time, I do not feel there is any life-threatening condition present. I have reviewed and discussed all results (EKG, imaging, lab, urine as appropriate) and exam findings with patient/family. I have reviewed nursing notes and appropriate previous records.  I feel the patient is safe to be discharged home without further emergent workup and can continue workup as an outpatient as needed. Discussed usual and customary return precautions. Patient/family verbalize understanding and are comfortable with this plan.  Outpatient follow-up has been provided if needed. All questions have been answered.    Nobie Alleyne, Layla MawKristen N, DO 04/27/17 [phone]

## 2017-05-09 ENCOUNTER — Encounter (HOSPITAL_COMMUNITY): Payer: Self-pay | Admitting: *Deleted

## 2017-05-09 ENCOUNTER — Emergency Department (HOSPITAL_COMMUNITY)
Admission: EM | Admit: 2017-05-09 | Discharge: 2017-05-09 | Disposition: A | Discharge status: Home / Self Care | Payer: BC Managed Care – PPO | Attending: Emergency Medicine | Admitting: Emergency Medicine

## 2017-05-09 DIAGNOSIS — R569 Unspecified convulsions: Secondary | ICD-10-CM | POA: Diagnosis present

## 2017-05-09 LAB — CBC
HEMATOCRIT: 37.8 % — AB (ref 39.0–52.0)
HEMOGLOBIN: 12.1 g/dL — AB (ref 13.0–17.0)
MCH: 30 pg (ref 26.0–34.0)
MCHC: 32 g/dL (ref 30.0–36.0)
MCV: 93.8 fL (ref 78.0–100.0)
Platelets: 246 10*3/uL (ref 150–400)
RBC: 4.03 MIL/uL — ABNORMAL LOW (ref 4.22–5.81)
RDW: 13.6 % (ref 11.5–15.5)
WBC: 3.9 10*3/uL — ABNORMAL LOW (ref 4.0–10.5)

## 2017-05-09 LAB — BASIC METABOLIC PANEL
ANION GAP: 6 (ref 5–15)
BUN: 5 mg/dL — ABNORMAL LOW (ref 6–20)
CALCIUM: 9.2 mg/dL (ref 8.9–10.3)
CHLORIDE: 102 mmol/L (ref 101–111)
CO2: 27 mmol/L (ref 22–32)
Creatinine, Ser: 0.86 mg/dL (ref 0.61–1.24)
GFR calc Af Amer: >60 mL/min (ref >60)
GFR calc non Af Amer: >60 mL/min (ref >60)
GLUCOSE: 91 mg/dL (ref 65–99)
Potassium: 3.7 mmol/L (ref 3.5–5.1)
Sodium: 135 mmol/L (ref 135–145)

## 2017-05-09 LAB — PHENYTOIN LEVEL, TOTAL: Phenytoin Lvl: <2.5 ug/mL — ABNORMAL LOW (ref 10.0–20.0)

## 2017-05-09 MED ORDER — SODIUM CHLORIDE 0.9 % IV SOLN
1000.0000 mg | Freq: Once | INTRAVENOUS | Status: AC
  Administered 2017-05-09: 1000 mg via INTRAVENOUS
  Filled 2017-05-09: qty 20

## 2017-05-09 NOTE — Discharge Instructions (Signed)
Please read and follow all provided instructions.  Your diagnoses today include:  1. Seizure (HCC)     Tests performed today include: Vital signs. See below for your results today.   Medications prescribed:  Take as prescribed   Home care instructions:  Follow any educational materials contained in this packet.  Follow-up instructions: Please follow-up with your Neurologist for further evaluation of symptoms and treatment   Return instructions:  Please return to the Emergency Department if you do not get better, if you get worse, or new symptoms OR  - Fever (temperature greater than 101.18F)  - Bleeding that does not stop with holding pressure to the area    -Severe pain (please note that you may be more sore the day after your accident)  - Chest Pain  - Difficulty breathing  - Severe nausea or vomiting  - Inability to tolerate food and liquids  - Passing out  - Skin becoming red around your wounds  - Change in mental status (confusion or lethargy)  - New numbness or weakness    Please return if you have any other emergent concerns.  Additional Information:  Your vital signs today were: BP (!) 156/96    Pulse 67    Resp 12    SpO2 96%  If your blood pressure (BP) was elevated above 135/85 this visit, please have this repeated by your doctor within one month. ---------------

## 2017-05-09 NOTE — ED Triage Notes (Signed)
Pt arrives from home with c/o a seizure. Pt remains postictal upon arrival to ED.

## 2017-05-09 NOTE — ED Provider Notes (Signed)
[facility]-EMERGENCY DEPT Provider Note   CSN: [id number] Arrival date & time: 05/09/17  [id number]     History   Chief Complaint Chief Complaint  Patient presents with  . Seizures    HPI Coralie CarpenJohn Tooley is a 61 y.o. male.  HPI  61 y.o. male with a hx of Seizure Disorder with Non compliance on medications, presents to the Emergency Department today due to seizure today. Unwitnessed. Pt states it occurred last night when he was lying on the recliner. No head trauma that he is aware of. No N/V. No headaches. No fevers. No CP/SOB/ABD pain. No visual changes. Pt states that he is on Dilantin and has been taking regularly. 300mg  at night. Unsure of next Neurology follow up. Pt with hx same on 04-26-17 with presentation to ED. No other symptoms noted.   Past Medical History:  Diagnosis Date  . Noncompliance with medication regimen   . Seizures (HCC)     There are no active problems to display for this patient.   No past surgical history on file.     Home Medications    Prior to Admission medications   Medication Sig Start Date End Date Taking? Authorizing Provider  phenytoin (DILANTIN) 100 MG ER capsule Take 3 capsules (300 mg total) by mouth at bedtime. 04/27/17   Ward, Layla MawKristen N, DO    Family History Family History  Problem Relation Age of Onset  . Heart disease Mother   . Cancer Neg Hx   . Diabetes Neg Hx   . Stroke Neg Hx     Social History Social History  Substance Use Topics  . Smoking status: Never Smoker  . Smokeless tobacco: Never Used  . Alcohol use 4.2 oz/week    7 Cans of beer per week     Comment: occassionally     Allergies   Patient has no known allergies.   Review of Systems Review of Systems ROS reviewed and all are negative for acute change except as noted in the HPI.  Physical Exam Updated Vital Signs BP (!) 156/96   Pulse 67   Resp 12   SpO2 96%   Physical Exam  Constitutional: He is oriented to person, place, and time. Vital signs are normal.  He appears well-developed and well-nourished.  HENT:  Head: Normocephalic and atraumatic.  Right Ear: Hearing normal.  Left Ear: Hearing normal.  No tongue biting noted  Eyes: Conjunctivae and EOM are normal. Pupils are equal, round, and reactive to light.  Neck: Normal range of motion. Neck supple.  Cardiovascular: Normal rate, regular rhythm, normal heart sounds and intact distal pulses.   Pulmonary/Chest: Effort normal and breath sounds normal.  Abdominal: There is no tenderness.  Musculoskeletal: Normal range of motion.  Neurological: He is alert and oriented to person, place, and time. He has normal strength. No cranial nerve deficit or sensory deficit.  Cranial Nerves:  II: Pupils equal, round, reactive to light III,IV, VI: ptosis not present, extra-ocular motions intact bilaterally  V,VII: smile symmetric, facial light touch sensation equal VIII: hearing grossly normal bilaterally  IX,X: midline uvula rise  XI: bilateral shoulder shrug equal and strong XII: midline tongue extension  Skin: Skin is warm and dry.  Psychiatric: He has a normal mood and affect. His speech is normal and behavior is normal. Thought content normal.  Nursing note and vitals reviewed.    ED Treatments / Results  Labs (all labs ordered are listed, but only abnormal results are displayed) Labs Reviewed  PHENYTOIN  LEVEL, TOTAL - Abnormal; Notable for the following:       Result Value   Phenytoin Lvl <2.5 (*)    All other components within normal limits  CBC - Abnormal; Notable for the following:    WBC 3.9 (*)    RBC 4.03 (*)    Hemoglobin 12.1 (*)    HCT 37.8 (*)    All other components within normal limits  BASIC METABOLIC PANEL - Abnormal; Notable for the following:    BUN 5 (*)    All other components within normal limits  RAPID URINE DRUG SCREEN, HOSP PERFORMED  URINALYSIS, ROUTINE W REFLEX MICROSCOPIC    EKG  EKG Interpretation None       Radiology No results  found.  Procedures Procedures (including critical care time)  Medications Ordered in ED Medications  phenytoin (DILANTIN) 1,000 mg in sodium chloride 0.9 % 250 mL IVPB (0 mg Intravenous Stopped 05/09/17 1406)     Initial Impression / Assessment and Plan / ED Course  I have reviewed the triage vital signs and the nursing notes.  Pertinent labs & imaging results that were available during my care of the patient were reviewed by me and considered in my medical decision making (see chart for details).  Final Clinical Impressions(s) / ED Diagnoses  {I have reviewed and evaluated the relevant laboratory values.   {I have reviewed the relevant previous healthcare records.  {I obtained HPI from historian.   ED Course:  Assessment: Pt is a 61 y.o. male with hx Seizures with medical non compliance who presents with seizure last night. Hx same. Seen in ED on 04-26-17 for same. On exam, pt in NAD. Nontoxic/nonseptic appearing. VSS. Afebrile. Lungs CTA. Heart RRR. Abdomen nontender soft. CBC unremarkable. BMP unremarkable. Dilantin level subtherapeutic. Given 1000mg  Dialntin IV in ED in ED as this was done in past with subtherapeutic levels. Also noted bed bug on arrival to ED. Placed in specimen cup. No excoriations on palms or inter web spaces. Plan is to DC home with follow up to Neurology. At time of discharge, Patient is in no acute distress. Vital Signs are stable. Patient is able to ambulate. Patient able to tolerate PO.   Disposition/Plan:  DC Home Additional Verbal discharge instructions given and discussed with patient.  Pt Instructed to f/u with Neurology in the next week for evaluation and treatment of symptoms. Return precautions given Pt acknowledges and agrees with plan  Supervising Physician Benjiman Core, MD  Final diagnoses:  Seizure [facility])    New Prescriptions New Prescriptions   No medications on file     Audry Pili, Cordelia Poche 05/09/17 1425    Benjiman Core,  MD 05/09/17 1626

## 2017-06-24 ENCOUNTER — Encounter (HOSPITAL_COMMUNITY): Payer: Self-pay | Admitting: Emergency Medicine

## 2017-06-24 ENCOUNTER — Observation Stay (HOSPITAL_COMMUNITY)
Admission: EM | Admit: 2017-06-24 | Discharge: 2017-06-27 | Disposition: A | Discharge status: Home / Self Care | Payer: BC Managed Care – PPO | Attending: Internal Medicine | Admitting: Internal Medicine

## 2017-06-24 DIAGNOSIS — E162 Hypoglycemia, unspecified: Secondary | ICD-10-CM | POA: Diagnosis not present

## 2017-06-24 DIAGNOSIS — Z789 Other specified health status: Secondary | ICD-10-CM | POA: Diagnosis not present

## 2017-06-24 DIAGNOSIS — Z7289 Other problems related to lifestyle: Secondary | ICD-10-CM | POA: Diagnosis present

## 2017-06-24 DIAGNOSIS — I4510 Unspecified right bundle-branch block: Secondary | ICD-10-CM | POA: Insufficient documentation

## 2017-06-24 DIAGNOSIS — Z91148 Patient's other noncompliance with medication regimen for other reason: Secondary | ICD-10-CM

## 2017-06-24 DIAGNOSIS — R569 Unspecified convulsions: Principal | ICD-10-CM | POA: Insufficient documentation

## 2017-06-24 DIAGNOSIS — Z8249 Family history of ischemic heart disease and other diseases of the circulatory system: Secondary | ICD-10-CM | POA: Diagnosis not present

## 2017-06-24 DIAGNOSIS — E876 Hypokalemia: Secondary | ICD-10-CM | POA: Diagnosis not present

## 2017-06-24 DIAGNOSIS — F1010 Alcohol abuse, uncomplicated: Secondary | ICD-10-CM | POA: Diagnosis not present

## 2017-06-24 DIAGNOSIS — Z9114 Patient's other noncompliance with medication regimen: Secondary | ICD-10-CM | POA: Insufficient documentation

## 2017-06-24 LAB — COMPREHENSIVE METABOLIC PANEL
ALBUMIN: 2.9 g/dL — AB (ref 3.5–5.0)
ALT: 15 U/L — ABNORMAL LOW (ref 17–63)
ANION GAP: 10 (ref 5–15)
AST: 31 U/L (ref 15–41)
Alkaline Phosphatase: 48 U/L (ref 38–126)
BUN: 10 mg/dL (ref 6–20)
CHLORIDE: 106 mmol/L (ref 101–111)
CO2: 22 mmol/L (ref 22–32)
Calcium: 8.8 mg/dL — ABNORMAL LOW (ref 8.9–10.3)
Creatinine, Ser: 0.73 mg/dL (ref 0.61–1.24)
GFR calc Af Amer: >60 mL/min (ref >60)
GFR calc non Af Amer: >60 mL/min (ref >60)
GLUCOSE: 72 mg/dL (ref 65–99)
POTASSIUM: 3 mmol/L — AB (ref 3.5–5.1)
SODIUM: 138 mmol/L (ref 135–145)
TOTAL PROTEIN: 6.4 g/dL — AB (ref 6.5–8.1)
Total Bilirubin: 0.8 mg/dL (ref 0.3–1.2)

## 2017-06-24 LAB — RAPID URINE DRUG SCREEN, HOSP PERFORMED
Amphetamines: NOT DETECTED
BARBITURATES: NOT DETECTED
Benzodiazepines: NOT DETECTED
COCAINE: NOT DETECTED
Opiates: NOT DETECTED
TETRAHYDROCANNABINOL: NOT DETECTED

## 2017-06-24 LAB — CBG MONITORING, ED
GLUCOSE-CAPILLARY: 66 mg/dL (ref 65–99)
GLUCOSE-CAPILLARY: 101 mg/dL — AB (ref 65–99)
Glucose-Capillary: 45 mg/dL — ABNORMAL LOW (ref 65–99)
Glucose-Capillary: 50 mg/dL — ABNORMAL LOW (ref 65–99)
Glucose-Capillary: 83 mg/dL (ref 65–99)

## 2017-06-24 LAB — CBC
HEMATOCRIT: 31.4 % — AB (ref 39.0–52.0)
Hemoglobin: 10.2 g/dL — ABNORMAL LOW (ref 13.0–17.0)
MCH: 29.7 pg (ref 26.0–34.0)
MCHC: 32.5 g/dL (ref 30.0–36.0)
MCV: 91.3 fL (ref 78.0–100.0)
PLATELETS: 238 10*3/uL (ref 150–400)
RBC: 3.44 MIL/uL — ABNORMAL LOW (ref 4.22–5.81)
RDW: 13.4 % (ref 11.5–15.5)
WBC: 7.1 10*3/uL (ref 4.0–10.5)

## 2017-06-24 LAB — ETHANOL: Alcohol, Ethyl (B): <5 mg/dL (ref <5)

## 2017-06-24 LAB — PHENYTOIN LEVEL, TOTAL: Phenytoin Lvl: <2.5 ug/mL — ABNORMAL LOW (ref 10.0–20.0)

## 2017-06-24 MED ORDER — ONDANSETRON HCL 4 MG PO TABS: 4.0000 mg | ORAL_TABLET | Freq: Four times a day (QID) | ORAL | Status: DC | PRN

## 2017-06-24 MED ORDER — PHENYTOIN SODIUM EXTENDED 100 MG PO CAPS
300.0000 mg | ORAL_CAPSULE | Freq: Every day | ORAL | Status: DC
  Administered 2017-06-25 – 2017-06-27 (×3): 300 mg via ORAL
  Filled 2017-06-24 (×3): qty 3

## 2017-06-24 MED ORDER — DEXTROSE-NACL 5-0.45 % IV SOLN
INTRAVENOUS | Status: DC
  Administered 2017-06-24 – 2017-06-26 (×3): via INTRAVENOUS

## 2017-06-24 MED ORDER — ENOXAPARIN SODIUM 40 MG/0.4ML ~~LOC~~ SOLN
40.0000 mg | SUBCUTANEOUS | Status: DC
  Administered 2017-06-25 – 2017-06-26 (×3): 40 mg via SUBCUTANEOUS
  Filled 2017-06-24 (×4): qty 0.4

## 2017-06-24 MED ORDER — THIAMINE HCL 100 MG/ML IJ SOLN: 100.0000 mg | Freq: Every day | INTRAMUSCULAR | Status: DC

## 2017-06-24 MED ORDER — LORAZEPAM 2 MG/ML IJ SOLN: 0.0000 mg | Freq: Two times a day (BID) | INTRAMUSCULAR | Status: DC

## 2017-06-24 MED ORDER — VITAMIN B-1 100 MG PO TABS
100.0000 mg | ORAL_TABLET | Freq: Every day | ORAL | Status: DC
  Administered 2017-06-25 – 2017-06-27 (×3): 100 mg via ORAL
  Filled 2017-06-24 (×3): qty 1

## 2017-06-24 MED ORDER — ZOLPIDEM TARTRATE 5 MG PO TABS: 5.0000 mg | ORAL_TABLET | Freq: Every evening | ORAL | Status: DC | PRN

## 2017-06-24 MED ORDER — DEXTROSE 50 % IV SOLN
1.0000 | Freq: Once | INTRAVENOUS | Status: AC
  Administered 2017-06-24: 50 mL via INTRAVENOUS
  Filled 2017-06-24: qty 50

## 2017-06-24 MED ORDER — ACETAMINOPHEN 325 MG PO TABS: 650.0000 mg | ORAL_TABLET | Freq: Four times a day (QID) | ORAL | Status: DC | PRN

## 2017-06-24 MED ORDER — LORAZEPAM 2 MG/ML IJ SOLN: 0.0000 mg | Freq: Four times a day (QID) | INTRAMUSCULAR | Status: AC

## 2017-06-24 MED ORDER — FOLIC ACID 1 MG PO TABS
1.0000 mg | ORAL_TABLET | Freq: Every day | ORAL | Status: DC
  Administered 2017-06-25 – 2017-06-27 (×3): 1 mg via ORAL
  Filled 2017-06-24 (×3): qty 1

## 2017-06-24 MED ORDER — DEXTROSE 50 % IV SOLN: 1.0000 | INTRAVENOUS | Status: DC | PRN

## 2017-06-24 MED ORDER — LORAZEPAM 2 MG/ML IJ SOLN: 1.0000 mg | Freq: Four times a day (QID) | INTRAMUSCULAR | Status: DC | PRN

## 2017-06-24 MED ORDER — LORAZEPAM 1 MG PO TABS: 1.0000 mg | ORAL_TABLET | Freq: Four times a day (QID) | ORAL | Status: DC | PRN

## 2017-06-24 MED ORDER — ADULT MULTIVITAMIN W/MINERALS CH
1.0000 | ORAL_TABLET | Freq: Every day | ORAL | Status: DC
  Administered 2017-06-25 – 2017-06-27 (×3): 1 via ORAL
  Filled 2017-06-24 (×3): qty 1

## 2017-06-24 MED ORDER — POTASSIUM CHLORIDE CRYS ER 20 MEQ PO TBCR
40.0000 meq | EXTENDED_RELEASE_TABLET | Freq: Once | ORAL | Status: AC
  Administered 2017-06-24: 40 meq via ORAL
  Filled 2017-06-24: qty 2

## 2017-06-24 MED ORDER — ACETAMINOPHEN 650 MG RE SUPP: 650.0000 mg | Freq: Four times a day (QID) | RECTAL | Status: DC | PRN

## 2017-06-24 MED ORDER — ONDANSETRON HCL 4 MG/2ML IJ SOLN: 4.0000 mg | Freq: Four times a day (QID) | INTRAMUSCULAR | Status: DC | PRN

## 2017-06-24 MED ORDER — PHENYTOIN SODIUM 50 MG/ML IJ SOLN
1500.0000 mg | Freq: Once | INTRAMUSCULAR | Status: AC
  Administered 2017-06-24: 1500 mg via INTRAVENOUS
  Filled 2017-06-24: qty 30

## 2017-06-24 NOTE — H&P (Addendum)
History and Physical    Mason Hart ZOX:[id number] DOB: [birth year] DOA: 06/24/2017  Referring MD/NP/PA:   PCP: Lorre Munroe, NP   Patient coming from:  The patient is coming from home.  At baseline, pt is independent for most of ADL.   Chief Complaint: seizure  HPI: Mason Hart is a 61 y.o. Hart with medical history significant of seizure, medication noncompliance, alcohol use, who presents with seizure.  Per EMS report, pt was found on the floor with seizure by family member. Pt was found to have CBG 56. No hx of diabetes mellitus.  Pt was post ictal on EMS arrival. Pt got 25g of  D50, CBG improved to 92. When I saw pt in ED, he is oriented x 3. He states that I could not afford her seizure medication Dilantin, and has not taken this medication for several weeks. Patient does not have fever, chills, unilateral weakness, numbness or tingling to extremities. No facial droop, vision change or hearing loss. Patient denies chest pain, shortness of breath, nausea, vomiting, diarrhea or abdominal pain. No symptoms of UTI. Patient states that he drinks beer whenever he has money. Last drink was yesterday.  ED Course: pt was found to have Phenytoin level less than 2.5, alcohol level less than 5, WBC 7.1, potassium 3.0, creatinine normal, no fever, no tachycardia, oxygen saturation 95% on room air. Pt is placed on telemetry bed observation. Pt was loaded with 1500 mg of Dilantin in ED.  Review of Systems:   General: no fevers, chills, no changes in body weight, has fatigue HEENT: no blurry vision, hearing changes or sore throat Respiratory: no dyspnea, coughing, wheezing CV: no chest pain, no palpitations GI: no nausea, vomiting, abdominal pain, diarrhea, constipation GU: no dysuria, burning on urination, increased urinary frequency, hematuria  Ext: no leg edema Neuro: no unilateral weakness, numbness, or tingling, no vision change or hearing loss. Had seizure. Skin: no rash, no skin  tear. MSK: No muscle spasm, no deformity, no limitation of range of movement in spin Heme: No easy bruising.  Travel history: No recent long distant travel.  Allergy: No Known Allergies  Past Medical History:  Diagnosis Date  . Noncompliance with medication regimen   . Seizures (HCC)     History reviewed. No pertinent surgical history.  Social History:  reports that he has never smoked. He has never used smokeless tobacco. He reports that he drinks about 4.2 oz of alcohol per week . He reports that he does not use drugs.  Family History:  Family History  Problem Relation Age of Onset  . Heart disease Mother   . Cancer Neg Hx   . Diabetes Neg Hx   . Stroke Neg Hx      Prior to Admission medications   Medication Sig Start Date End Date Taking? Authorizing Provider  UNKNOWN TO PATIENT Take 1 tablet by mouth daily. Pt stated "I take a red allergy pill".   Yes [provider]  phenytoin (DILANTIN) 100 MG ER capsule Take 3 capsules (300 mg total) by mouth at bedtime. Patient not taking: Reported on 05/09/2017 04/27/17   Ward, Layla Maw, DO    Physical Exam: Vitals:   06/24/17 2115 06/24/17 2130 06/24/17 2200 06/24/17 2215  BP: 129/76 132/68 138/73 133/70  Pulse: 82 83 88 85  Resp: 20 16 (!) 23 (!) 24  Temp:      TempSrc:      SpO2: 97% 98% 97% 96%  Weight:  Height:       General: Not in acute distress HEENT:       Eyes: PERRL, EOMI, no scleral icterus.       ENT: No discharge from the ears and nose, no pharynx injection, no tonsillar enlargement.        Neck: No JVD, no bruit, no mass felt. Heme: No neck lymph node enlargement. Cardiac: S1/S2, RRR, No murmurs, No gallops or rubs. Respiratory: Rales, wheezing, rhonchi or rubs. GI: Soft, nondistended, nontender, no rebound pain, no organomegaly, BS present. GU: No hematuria Ext: No pitting leg edema bilaterally. 2+DP/PT pulse bilaterally. Musculoskeletal: No joint deformities, No joint redness or warmth, no  limitation of ROM in spin. Skin: No rashes.  Neuro: Crurrenly, alert, oriented X3, cranial nerves II-XII grossly intact, moves all extremities normally. Muscle strength 5/5 in all extremities, sensation to light touch intact. Brachial reflex 2+ bilaterally. Negative Babinski's sign. Normal finger to nose test. Psych: Patient is not psychotic, no suicidal or hemocidal ideation.  Labs on Admission: I have personally reviewed following labs and imaging studies  CBC:  Recent Labs Lab 06/24/17 2037  WBC 7.1  HGB 10.2*  HCT 31.4*  MCV 91.3  PLT 238   Basic Metabolic Panel:  Recent Labs Lab 06/24/17 2037  NA 138  K 3.0*  CL 106  CO2 22  GLUCOSE 72  BUN 10  CREATININE 0.73  CALCIUM 8.8*   GFR: Estimated Creatinine Clearance: 106.8 mL/min (by C-G formula based on SCr of 0.73 mg/dL). Liver Function Tests:  Recent Labs Lab 06/24/17 2037  AST 31  ALT 15*  ALKPHOS 48  BILITOT 0.8  PROT 6.4*  ALBUMIN 2.9*   No results for input(s): LIPASE, AMYLASE in the last 168 hours. No results for input(s): AMMONIA in the last 168 hours. Coagulation Profile: No results for input(s): INR, PROTIME in the last 168 hours. Cardiac Enzymes: No results for input(s): CKTOTAL, CKMB, CKMBINDEX, TROPONINI in the last 168 hours. BNP (last 3 results) No results for input(s): PROBNP in the last 8760 hours. HbA1C: No results for input(s): HGBA1C in the last 72 hours. CBG:  Recent Labs Lab 06/24/17 2040 06/24/17 2111 06/24/17 2126 06/24/17 2144  GLUCAP 66 45* 50* 101*   Lipid Profile: No results for input(s): CHOL, HDL, LDLCALC, TRIG, CHOLHDL, LDLDIRECT in the last 72 hours. Thyroid Function Tests: No results for input(s): TSH, T4TOTAL, FREET4, T3FREE, THYROIDAB in the last 72 hours. Anemia Panel: No results for input(s): VITAMINB12, FOLATE, FERRITIN, TIBC, IRON, RETICCTPCT in the last 72 hours. Urine analysis:    Component Value Date/Time   COLORURINE YELLOW 04/26/2017 2351    APPEARANCEUR CLEAR 04/26/2017 2351   LABSPEC 1.019 04/26/2017 2351   PHURINE 6.0 04/26/2017 2351   GLUCOSEU NEGATIVE 04/26/2017 2351   HGBUR NEGATIVE 04/26/2017 2351   BILIRUBINUR NEGATIVE 04/26/2017 2351   KETONESUR NEGATIVE 04/26/2017 2351   PROTEINUR 30 (A) 04/26/2017 2351   UROBILINOGEN 1.0 04/04/2012 1449   NITRITE NEGATIVE 04/26/2017 2351   LEUKOCYTESUR NEGATIVE 04/26/2017 2351   Sepsis Labs: @LABRCNTIP (procalcitonin:4,lacticidven:4) )No results found for this or any previous visit (from the past 240 hour(s)).   Radiological Exams on Admission: No results found.   EKG:  Not done in ED, will get one.   Assessment/Plan Principal Problem:   Seizures (HCC) Active Problems:   Noncompliance with medication regimen   Hypokalemia   Hypoglycemia   Alcohol use   Seizure (HCC)   Seizures (HCC): Most likely due to medication noncompliance. Phenytoin level less than  2.5. Patient states that he has not taken seizure medication for several weeks since he could not afford. Patient also has hypoglycemia and alcohol issue, which may have also contributed partially. No focal neurological findings on physical examination. Pt was loaded with 1500 mg of Dilantin in ED.  -will place on tele bed for obs -Frequent neuro check -Seizure precaution -continue home dilantin, 300 mg daily -CM consult for medication need  Tobacco use: -Did counseling about the importance of quitting drinking -CIWA protocol  Hypokalemia: K=3.0 on admission. - Repleted - Check Mg level  Hypoglycemia: Etiology is not clear. Patient does not have history of diabetes, not taking hypoglycemic medications. Likely due to decreased oral intake and alcohol use, but need to rule out other possibilities -will check sulfonylurea panel, insulin level, C-peptide and cortisol level -q1h CBG -prn D50 -D5-1/2 NS at 100 cc/h   DVT ppx: SQ Lovenox Code Status: Full code Family Communication: None at bed side.    Disposition Plan:  Anticipate discharge back to previous home environment Consults called:  none Admission status: Obs / tele       Date of Service 06/24/2017    Lorretta Harp [facility] Pager [phone]  If 7PM-7AM, please contact night-coverage [email or web address] Password [facility] 06/24/2017, 10:42 PM

## 2017-06-24 NOTE — ED Notes (Addendum)
Sugar checked it is 45 given OJ and peanut butter crackers

## 2017-06-24 NOTE — ED Triage Notes (Signed)
Per EMS pt from home family member left found pt on floor unwitnessed seizure, upon arrival pt post ictal only alert to p[ain CBG 56, no h/s of diabetes, 4-5 mins patient axo. Pt does not think he has taken his seizure mediations. Pt got 25G D50 last CBG 92.

## 2017-06-24 NOTE — ED Provider Notes (Signed)
[facility] Provider Note   CSN: [id number] Arrival date & time: 06/24/17  2027     History   Chief Complaint No chief complaint on file.   HPI Mason Hart is a 61 y.o. male.  HPI  61 y.o. male with a hx of Seizures, presents to the Emergency Department today due to unwitnessed seizure. Pt post ictal on EMS arrival with CBG 56. No hx DM. Pt states he does not think he took his seizure medication and references not taking allergy medication from drug store. On further enquiry, pt states he has been out of seizure medication for several weeks and has not taken recently. Pt is on Dilantin 100mg  ER TID. EMS given 25G D50. Most recent CBG 92. Denies symptoms currently. No N/V/D. No CP/SOB/ABD pain. No headaches. No vision changes. No fevers. No cough or URI symptoms. No unilateral weakness or numbness. No other symptoms noted.    Past Medical History:  Diagnosis Date  . Noncompliance with medication regimen   . Seizures (HCC)     There are no active problems to display for this patient.   No past surgical history on file.     Home Medications    Prior to Admission medications   Medication Sig Start Date End Date Taking? Authorizing Provider  phenytoin (DILANTIN) 100 MG ER capsule Take 3 capsules (300 mg total) by mouth at bedtime. Patient not taking: Reported on 05/09/2017 04/27/17   Ward, Layla Maw, DO    Family History Family History  Problem Relation Age of Onset  . Heart disease Mother   . Cancer Neg Hx   . Diabetes Neg Hx   . Stroke Neg Hx     Social History Social History  Substance Use Topics  . Smoking status: Never Smoker  . Smokeless tobacco: Never Used  . Alcohol use 4.2 oz/week    7 Cans of beer per week     Comment: occassionally     Allergies   Patient has no known allergies.   Review of Systems Review of Systems ROS reviewed and all are negative for acute change except as noted in the HPI.  Physical Exam Updated Vital Signs BP  129/76   Pulse 82   Temp 98 F (36.7 C) (Oral)   Resp 20   Ht 6\' 1"  (1.854 m)   Wt 95.3 kg (210 lb)   SpO2 97%   BMI 27.71 kg/m   Physical Exam  Constitutional: He is oriented to person, place, and time. Vital signs are normal. He appears well-developed and well-nourished.  HENT:  Head: Normocephalic.  Right Ear: Hearing normal.  Left Ear: Hearing normal.  Eyes: Pupils are equal, round, and reactive to light. Conjunctivae and EOM are normal.  Neck: Normal range of motion. Neck supple.  Cardiovascular: Normal rate and regular rhythm.   Pulmonary/Chest: Effort normal and breath sounds normal.  Musculoskeletal: Normal range of motion.  Neurological: He is alert and oriented to person, place, and time. He has normal strength. He is not disoriented. No cranial nerve deficit or sensory deficit.  Cranial Nerves:  II: Pupils equal, round, reactive to light III,IV, VI: ptosis not present, extra-ocular motions intact bilaterally  V,VII: smile symmetric, facial light touch sensation equal VIII: hearing grossly normal bilaterally  IX,X: midline uvula rise  XI: bilateral shoulder shrug equal and strong XII: midline tongue extension Appears Postictal   Skin: Skin is warm and dry.  Psychiatric: He has a normal mood and affect. His speech is normal  and behavior is normal. Thought content normal.  Nursing note and vitals reviewed.  ED Treatments / Results  Labs (all labs ordered are listed, but only abnormal results are displayed) Labs Reviewed  CBC - Abnormal; Notable for the following:       Result Value   RBC 3.44 (*)    Hemoglobin 10.2 (*)    HCT 31.4 (*)    All other components within normal limits  COMPREHENSIVE METABOLIC PANEL - Abnormal; Notable for the following:    Potassium 3.0 (*)    Calcium 8.8 (*)    Total Protein 6.4 (*)    Albumin 2.9 (*)    ALT 15 (*)    All other components within normal limits  PHENYTOIN LEVEL, TOTAL - Abnormal; Notable for the following:     Phenytoin Lvl <2.5 (*)    All other components within normal limits  CBG MONITORING, ED - Abnormal; Notable for the following:    Glucose-Capillary 45 (*)    All other components within normal limits  CBG MONITORING, ED - Abnormal; Notable for the following:    Glucose-Capillary 50 (*)    All other components within normal limits  CBG MONITORING, ED - Abnormal; Notable for the following:    Glucose-Capillary 101 (*)    All other components within normal limits  ETHANOL  RAPID URINE DRUG SCREEN, HOSP PERFORMED  CBG MONITORING, ED    EKG  EKG Interpretation None       Radiology No results found.  Procedures Procedures (including critical care time)  Medications Ordered in ED Medications  phenytoin (DILANTIN) 1,500 mg in sodium chloride 0.9 % 250 mL IVPB (not administered)  dextrose 5 %-0.45 % sodium chloride infusion ( Intravenous New Bag/Given 06/24/17 2131)  potassium chloride SA (K-DUR,KLOR-CON) CR tablet 40 mEq (not administered)  dextrose 50 % solution 50 mL (50 mLs Intravenous Given 06/24/17 2129)     Initial Impression / Assessment and Plan / ED Course  I have reviewed the triage vital signs and the nursing notes.  Pertinent labs & imaging results that were available during my care of the patient were reviewed by me and considered in my medical decision making (see chart for details).  Final Clinical Impressions(s) / ED Diagnoses  {I have reviewed and evaluated the relevant laboratory values.   {I have reviewed the relevant previous healthcare records.  {I obtained HPI from historian.   ED Course:  Assessment: Pt is a 61 y.o. male with a hx of Seizures, presents to the Emergency Department today due to unwitnessed seizure. Pt post ictal on EMS arrival with CBG 56. No hx DM. Pt states he does not think he took his seizure medication and references not taking allergy medication from drug store. On further enquiry, pt states he has been out of seizure medication for  several weeks and has not taken recently. Pt is on Dilantin 100mg  ER TID. EMS given 25G D50. Most recent CBG 92. Denies symptoms currently. No N/V/D. No CP/SOB/ABD pain. No headaches. No vision changes. No fevers. No cough or URI symptoms. No unilateral weakness or numbness.  On exam, pt in NAD. Nontoxic/nonseptic appearing. VSS. Afebrile. Lungs CTA. Heart RRR. Abdomen nontender soft. CBC unremarkable. CMP unremarkable. Dilantin level sub therapeutic. Given 1500mg  IV Dilantin. Seen and discussed with attending physician.   9:26 PM Repeat CBG 45. Given 1 AMP D50. Placed on D5 1/2NS infusion. Given 2 dose of D50 as well as persistent hypoglycemia. Will admit for observation. Seizure 2/2 possible  hypoglycemia vs medical noncompliance.    Plan is to Admit.   Disposition/Plan:  Admit Pt acknowledges and agrees with plan  Supervising Physician Mesner, Barbara CowerJason, MD  Final diagnoses:  Seizure [facility](HCC)  Hypoglycemia    New Prescriptions New Prescriptions   No medications on file       Audry PiliMohr, [place] Krist, Cordelia Poche-C 06/24/17 2210    Mesner, Barbara CowerJason, MD 06/25/17 [id number]

## 2017-06-25 DIAGNOSIS — E162 Hypoglycemia, unspecified: Secondary | ICD-10-CM

## 2017-06-25 DIAGNOSIS — Z9114 Patient's other noncompliance with medication regimen: Secondary | ICD-10-CM

## 2017-06-25 DIAGNOSIS — Z789 Other specified health status: Secondary | ICD-10-CM

## 2017-06-25 DIAGNOSIS — R569 Unspecified convulsions: Secondary | ICD-10-CM

## 2017-06-25 LAB — GLUCOSE, CAPILLARY
GLUCOSE-CAPILLARY: 73 mg/dL (ref 65–99)
GLUCOSE-CAPILLARY: 74 mg/dL (ref 65–99)
GLUCOSE-CAPILLARY: 87 mg/dL (ref 65–99)
Glucose-Capillary: 79 mg/dL (ref 65–99)
Glucose-Capillary: 64 mg/dL — ABNORMAL LOW (ref 65–99)
Glucose-Capillary: 88 mg/dL (ref 65–99)

## 2017-06-25 LAB — BASIC METABOLIC PANEL
ANION GAP: 7 (ref 5–15)
BUN: 8 mg/dL (ref 6–20)
CALCIUM: 8.8 mg/dL — AB (ref 8.9–10.3)
CO2: 27 mmol/L (ref 22–32)
Chloride: 105 mmol/L (ref 101–111)
Creatinine, Ser: 0.66 mg/dL (ref 0.61–1.24)
Glucose, Bld: 72 mg/dL (ref 65–99)
POTASSIUM: 4.1 mmol/L (ref 3.5–5.1)
Sodium: 139 mmol/L (ref 135–145)

## 2017-06-25 LAB — CBC
HEMATOCRIT: 33.7 % — AB (ref 39.0–52.0)
HEMOGLOBIN: 10.6 g/dL — AB (ref 13.0–17.0)
MCH: 29 pg (ref 26.0–34.0)
MCHC: 31.5 g/dL (ref 30.0–36.0)
MCV: 92.1 fL (ref 78.0–100.0)
Platelets: 178 10*3/uL (ref 150–400)
RBC: 3.66 MIL/uL — AB (ref 4.22–5.81)
RDW: 13.3 % (ref 11.5–15.5)
WBC: 6.8 10*3/uL (ref 4.0–10.5)

## 2017-06-25 LAB — CORTISOL-AM, BLOOD: CORTISOL - AM: 3.7 ug/dL — AB (ref 6.7–22.6)

## 2017-06-25 LAB — TSH: TSH: 2.077 u[IU]/mL (ref 0.350–4.500)

## 2017-06-25 LAB — HIV ANTIBODY (ROUTINE TESTING W REFLEX): HIV SCREEN 4TH GENERATION: NONREACTIVE

## 2017-06-25 LAB — MAGNESIUM: MAGNESIUM: 1.9 mg/dL (ref 1.7–2.4)

## 2017-06-25 MED ORDER — MAGNESIUM SULFATE IN D5W 1-5 GM/100ML-% IV SOLN
1.0000 g | Freq: Once | INTRAVENOUS | Status: AC
  Administered 2017-06-25: 1 g via INTRAVENOUS
  Filled 2017-06-25: qty 100

## 2017-06-25 NOTE — Progress Notes (Signed)
Pt blood glucose level was 64 at 1845, orange juice was given, and pt ate 100% of his dinner. Nurse tech was informed to recheck blood glucose at 1920.

## 2017-06-25 NOTE — Progress Notes (Signed)
PROGRESS NOTE    Mason Hart  ZWC:[id number] DOB: Jul 25, 1956 DOA: 06/24/2017 PCP: Lorre Munroe, NP   Outpatient Specialists:     Brief Narrative:  Mason Hart is a 61 y.o. male with medical history significant of seizure, medication noncompliance, alcohol use, who presents with seizure. Per EMS report, pt was found on the floor with seizure by family member. Pt was found to have CBG 56. No hx of diabetes mellitus.  Pt was post ictal on EMS arrival. Pt got 25g of  D50, CBG improved to 92. When I saw pt in ED, he is oriented x 3. He states that I could not afford her seizure medication Dilantin, and has not taken this medication for several weeks. Patient does not have fever, chills, unilateral weakness, numbness or tingling to extremities. No facial droop, vision change or hearing loss. Patient denies chest pain, shortness of breath, nausea, vomiting, diarrhea or abdominal pain. No symptoms of UTI. Patient states that he drinks beer whenever he has money. Last drink was yesterday.   Assessment & Plan:   Principal Problem:   Seizures (HCC) Active Problems:   Noncompliance with medication regimen   Hypokalemia   Hypoglycemia   Alcohol use   Seizure (HCC)   Seizures (HCC): -likely due to medication noncompliance -Phenytoin level less than 2.5. -has not taken seizure medication for several weeks since he could not afford ($22) - loaded with 1500 mg of Dilantin in ED-- resume home meds-- dilantin: 300 mg daily -CM consult for medication need  Tobacco use/alcohol abuse: -Did counseling about the importance of quitting drinking -CIWA protocol  Hypokalemia: K=3.0 on admission. - Repleted along with Mg  Hypoglycemia:   Likely due to decreased oral intake and alcohol use, but need to rule out other possibilities -will check sulfonylurea panel, insulin level, C-peptide -cortisol around 3.7-- has not been on steroids recently-- recheck in AM -D5-1/2 NS at 100 cc/h- wean as  tolerated    DVT prophylaxis:  Lovenox   Code Status: Full Code   Family Communication:   Disposition Plan:     Consultants:      Subjective: Says he cannot afford his medications  Objective: Vitals:   06/24/17 2315 06/25/17 0011 06/25/17 0440 06/25/17 0555  BP: 124/82 138/78 133/82   Pulse: 79 75 68   Resp: 20 17 17    Temp:  98.6 F (37 C)  98.6 F (37 C)  TempSrc:  Oral  Oral  SpO2: 98% 99% 99%   Weight:  98.6 kg (217 lb 6 oz)    Height:  6\' 1"  (1.854 m)      Intake/Output Summary (Last 24 hours) at 06/25/17 1230 Last data filed at 06/25/17 0859  Gross per 24 hour  Intake              570 ml  Output             2025 ml  Net            -1455 ml   Filed Weights   06/24/17 2030 06/25/17 0011  Weight: 95.3 kg (210 lb) 98.6 kg (217 lb 6 oz)    Examination:  General exam: Appears calm and comfortable  Respiratory system: Clear to auscultation. Respiratory effort normal. Cardiovascular system: S1 & S2 heard, RRR. No JVD, murmurs, rubs, gallops or clicks. No pedal edema. Gastrointestinal system: Abdomen is nondistended, soft and nontender. No organomegaly or masses felt. Normal bowel sounds heard. Central nervous system: Alert Extremities: Symmetric 5 x  5 power. Skin: No rashes, lesions or ulcers Psychiatry: poor insight into disease process    Data Reviewed: I have personally reviewed following labs and imaging studies  CBC:  Recent Labs Lab 06/24/17 2037 06/25/17 0517  WBC 7.1 6.8  HGB 10.2* 10.6*  HCT 31.4* 33.7*  MCV 91.3 92.1  PLT 238 178   Basic Metabolic Panel:  Recent Labs Lab 06/24/17 2037 06/25/17 0517  NA 138 139  K 3.0* 4.1  CL 106 105  CO2 22 27  GLUCOSE 72 72  BUN 10 8  CREATININE 0.73 0.66  CALCIUM 8.8* 8.8*  MG  --  1.9   GFR: Estimated Creatinine Clearance: 116.8 mL/min (by C-G formula based on SCr of 0.66 mg/dL). Liver Function Tests:  Recent Labs Lab 06/24/17 2037  AST 31  ALT 15*  ALKPHOS 48    BILITOT 0.8  PROT 6.4*  ALBUMIN 2.9*   No results for input(s): LIPASE, AMYLASE in the last 168 hours. No results for input(s): AMMONIA in the last 168 hours. Coagulation Profile: No results for input(s): INR, PROTIME in the last 168 hours. Cardiac Enzymes: No results for input(s): CKTOTAL, CKMB, CKMBINDEX, TROPONINI in the last 168 hours. BNP (last 3 results) No results for input(s): PROBNP in the last 8760 hours. HbA1C: No results for input(s): HGBA1C in the last 72 hours. CBG:  Recent Labs Lab 06/24/17 2245 06/25/17 0707 06/25/17 0858 06/25/17 1004 06/25/17 1203  GLUCAP 83 73 74 87 79   Lipid Profile: No results for input(s): CHOL, HDL, LDLCALC, TRIG, CHOLHDL, LDLDIRECT in the last 72 hours. Thyroid Function Tests:  Recent Labs  06/25/17 1052  TSH 2.077   Anemia Panel: No results for input(s): VITAMINB12, FOLATE, FERRITIN, TIBC, IRON, RETICCTPCT in the last 72 hours. Urine analysis:    Component Value Date/Time   COLORURINE YELLOW 04/26/2017 2351   APPEARANCEUR CLEAR 04/26/2017 2351   LABSPEC 1.019 04/26/2017 2351   PHURINE 6.0 04/26/2017 2351   GLUCOSEU NEGATIVE 04/26/2017 2351   HGBUR NEGATIVE 04/26/2017 2351   BILIRUBINUR NEGATIVE 04/26/2017 2351   KETONESUR NEGATIVE 04/26/2017 2351   PROTEINUR 30 (A) 04/26/2017 2351   UROBILINOGEN 1.0 04/04/2012 1449   NITRITE NEGATIVE 04/26/2017 2351   LEUKOCYTESUR NEGATIVE 04/26/2017 2351     )No results found for this or any previous visit (from the past 240 hour(s)).    Anti-infectives    None       Radiology Studies: No results found.      Scheduled Meds: . enoxaparin (LOVENOX) injection  40 mg Subcutaneous Q24H  . folic acid  1 mg Oral Daily  . LORazepam  0-4 mg Intravenous Q6H   Followed by  . [START ON 06/26/2017] LORazepam  0-4 mg Intravenous Q12H  . multivitamin with minerals  1 tablet Oral Daily  . phenytoin  300 mg Oral Daily  . thiamine  100 mg Oral Daily   Or  . thiamine  100 mg  Intravenous Daily   Continuous Infusions: . dextrose 5 % and 0.45% NaCl 100 mL/hr at 06/25/17 0702     LOS: 0 days    Time spent: 35 min    Julieanne Hadsall U Cloee Dunwoody, DO [facility] Pager [phone]  If 7PM-7AM, please contact night-coverage [email or web address] Password [email or web address] 06/25/2017, 12:30 PM

## 2017-06-26 DIAGNOSIS — Z9114 Patient's other noncompliance with medication regimen: Secondary | ICD-10-CM | POA: Diagnosis not present

## 2017-06-26 DIAGNOSIS — Z789 Other specified health status: Secondary | ICD-10-CM | POA: Diagnosis not present

## 2017-06-26 DIAGNOSIS — R569 Unspecified convulsions: Secondary | ICD-10-CM | POA: Diagnosis not present

## 2017-06-26 DIAGNOSIS — E162 Hypoglycemia, unspecified: Secondary | ICD-10-CM | POA: Diagnosis not present

## 2017-06-26 LAB — ACTH STIMULATION, 3 TIME POINTS
CORTISOL 30 MIN: 10.9 ug/dL
CORTISOL 60 MIN: 14.1 ug/dL
CORTISOL BASE: 4.9 ug/dL

## 2017-06-26 LAB — GLUCOSE, CAPILLARY
GLUCOSE-CAPILLARY: 89 mg/dL (ref 65–99)
Glucose-Capillary: 133 mg/dL — ABNORMAL HIGH (ref 65–99)

## 2017-06-26 LAB — C-PEPTIDE: C-Peptide: 1.9 ng/mL (ref 1.1–4.4)

## 2017-06-26 LAB — INSULIN, RANDOM: Insulin: 2.7 u[IU]/mL (ref 2.6–24.9)

## 2017-06-26 MED ORDER — COSYNTROPIN 0.25 MG IJ SOLR: 0.2500 mg | Freq: Once | INTRAMUSCULAR | Status: DC

## 2017-06-26 MED ORDER — COSYNTROPIN 0.25 MG IJ SOLR
0.2500 mg | Freq: Once | INTRAMUSCULAR | Status: AC
  Administered 2017-06-26: 0.25 mg via INTRAVENOUS
  Filled 2017-06-26: qty 0.25

## 2017-06-26 NOTE — Progress Notes (Signed)
Stimulation medication order was not put in. Called pharmacy and alerted NP.

## 2017-06-26 NOTE — Care Management Note (Signed)
Case Management Note  Patient Details  Name: Mason Hart MRN: [id number] Date of Birth: 1956-09-25  Subjective/Objective:                Admitted with seizures. From home alone. PTA independent with ADL's and no DME usage,    PCP: none,  Pt will f/u with [facility] once d/c   Action/Plan: Plan is to d/c to home today pending lab work. Hospital post follow up appointment scheduled for 07/02/2017 @ 10 am with Scot Juniffany Noel NP @ [facility]. Pt states has problems affording medication. Pt can fill Rx @ [facility]. Pharmacy can assist with medication needs.... CM to provide pt with Match Letter, as well. Information explained to pt.   Expected Discharge Date:   06/26/2017           Expected Discharge Plan:  Home/Self Care  In-House Referral:     Discharge planning Services  CM Consult, [facility], Follow-up appt scheduled  Post Acute Care Choice:    Choice offered to:     DME Arranged:    DME Agency:     HH Arranged:    HH Agency:     Status of Service:  Completed, signed off  If discussed at [employer] Meetings, dates discussed:    Additional Comments:  Epifanio LeschesCole, Sharmon Cheramie Hudson, RN 06/26/2017, 10:48 AM

## 2017-06-26 NOTE — Progress Notes (Signed)
PROGRESS NOTE    Mason Hart  PPI:[id number] DOB: [birth year] DOA: 06/24/2017 PCP: Lorre Munroe, NP   Outpatient Specialists:     Brief Narrative:  Mason Hart is a 61 y.o. male with medical history significant of seizure, medication noncompliance, alcohol use, who presents with seizure. Per EMS report, pt was found on the floor with seizure by family member. Pt was found to have CBG 56. No hx of diabetes mellitus.  Pt was post ictal on EMS arrival. Pt got 25g of  D50, CBG improved to 92. When I saw pt in ED, he is oriented x 3. He states that I could not afford her seizure medication Dilantin, and has not taken this medication for several weeks. Patient does not have fever, chills, unilateral weakness, numbness or tingling to extremities. No facial droop, vision change or hearing loss. Patient denies chest pain, shortness of breath, nausea, vomiting, diarrhea or abdominal pain. No symptoms of UTI. Patient states that he drinks beer whenever he has money. Last drink was yesterday.   Assessment & Plan:   Principal Problem:   Seizures (HCC) Active Problems:   Noncompliance with medication regimen   Hypokalemia   Hypoglycemia   Alcohol use   Seizure (HCC)   Seizures (HCC): -likely due to medication noncompliance -Phenytoin level less than 2.5. -has not taken seizure medication for several weeks since he could not afford ($22) - loaded with 1500 mg of Dilantin in ED-- resume home meds-- dilantin: 300 mg daily -CM consult for medication need- [facility]  Tobacco use/alcohol abuse: -Did counseling about the importance of quitting drinking/smoking -CIWA protocol  Hypokalemia: K=3.0 on admission. - Repleted along with Mg  Hypoglycemia:   Likely due to decreased oral intake and alcohol use, but need to rule out other possibilities - sulfonylurea panel pending, insulin level/C-peptide normal -cortisol around 3.7-- has not been on steroids  recently-- recheck shows response to ACTH but will need to be followed as an outpatient -d/c IVF with D5 and monitor with q 4 blood sugars  PT eval for safety at home    DVT prophylaxis:  Lovenox   Code Status: Full Code   Family Communication:   Disposition Plan:     Consultants:      Subjective: No nausea, no vomiting  Objective: Vitals:   06/25/17 1420 06/25/17 2122 06/26/17 0510 06/26/17 1425  BP: 131/76 127/87 (!) 142/87 125/68  Pulse: 81 77 73 75  Resp: 18 18 18 18   Temp: 97.8 F (36.6 C) 97.7 F (36.5 C) 98.2 F (36.8 C) 98.2 F (36.8 C)  TempSrc:  Oral Oral Oral  SpO2: 100% 98% 100% 100%  Weight:      Height:        Intake/Output Summary (Last 24 hours) at 06/26/17 1529 Last data filed at 06/26/17 1420  Gross per 24 hour  Intake          2031.67 ml  Output             3051 ml  Net         -1019.33 ml   Filed Weights   06/24/17 2030 06/25/17 0011  Weight: 95.3 kg (210 lb) 98.6 kg (217 lb 6 oz)    Examination:  General exam: Awake, NAD Respiratory system: Clear, no wheezing Cardiovascular system: rrr Gastrointestinal system: +BS, NT Central nervous system: Alert Extremities: moves all 4 ext. Psychiatry: poor insight into disease process    Data Reviewed: I  have personally reviewed following labs and imaging studies  CBC:  Recent Labs Lab 06/24/17 2037 06/25/17 0517  WBC 7.1 6.8  HGB 10.2* 10.6*  HCT 31.4* 33.7*  MCV 91.3 92.1  PLT 238 178   Basic Metabolic Panel:  Recent Labs Lab 06/24/17 2037 06/25/17 0517  NA 138 139  K 3.0* 4.1  CL 106 105  CO2 22 27  GLUCOSE 72 72  BUN 10 8  CREATININE 0.73 0.66  CALCIUM 8.8* 8.8*  MG  --  1.9   GFR: Estimated Creatinine Clearance: 116.8 mL/min (by C-G formula based on SCr of 0.66 mg/dL). Liver Function Tests:  Recent Labs Lab 06/24/17 2037  AST 31  ALT 15*  ALKPHOS 48  BILITOT 0.8  PROT 6.4*  ALBUMIN 2.9*   No results for input(s): LIPASE, AMYLASE in the  last 168 hours. No results for input(s): AMMONIA in the last 168 hours. Coagulation Profile: No results for input(s): INR, PROTIME in the last 168 hours. Cardiac Enzymes: No results for input(s): CKTOTAL, CKMB, CKMBINDEX, TROPONINI in the last 168 hours. BNP (last 3 results) No results for input(s): PROBNP in the last 8760 hours. HbA1C: No results for input(s): HGBA1C in the last 72 hours. CBG:  Recent Labs Lab 06/25/17 1004 06/25/17 1203 06/25/17 1845 06/25/17 1953 06/26/17 1508  GLUCAP 87 79 64* 88 89   Lipid Profile: No results for input(s): CHOL, HDL, LDLCALC, TRIG, CHOLHDL, LDLDIRECT in the last 72 hours. Thyroid Function Tests:  Recent Labs  06/25/17 1052  TSH 2.077   Anemia Panel: No results for input(s): VITAMINB12, FOLATE, FERRITIN, TIBC, IRON, RETICCTPCT in the last 72 hours. Urine analysis:    Component Value Date/Time   COLORURINE YELLOW 04/26/2017 2351   APPEARANCEUR CLEAR 04/26/2017 2351   LABSPEC 1.019 04/26/2017 2351   PHURINE 6.0 04/26/2017 2351   GLUCOSEU NEGATIVE 04/26/2017 2351   HGBUR NEGATIVE 04/26/2017 2351   BILIRUBINUR NEGATIVE 04/26/2017 2351   KETONESUR NEGATIVE 04/26/2017 2351   PROTEINUR 30 (A) 04/26/2017 2351   UROBILINOGEN 1.0 04/04/2012 1449   NITRITE NEGATIVE 04/26/2017 2351   LEUKOCYTESUR NEGATIVE 04/26/2017 2351     )No results found for this or any previous visit (from the past 240 hour(s)).    Anti-infectives    None       Radiology Studies: No results found.      Scheduled Meds: . enoxaparin (LOVENOX) injection  40 mg Subcutaneous Q24H  . folic acid  1 mg Oral Daily  . LORazepam  0-4 mg Intravenous Q6H   Followed by  . LORazepam  0-4 mg Intravenous Q12H  . multivitamin with minerals  1 tablet Oral Daily  . phenytoin  300 mg Oral Daily  . thiamine  100 mg Oral Daily   Continuous Infusions:    LOS: 0 days    Time spent: 25 min    Ella Golomb U Nathanyel Defenbaugh, DO [facility] Pager [phone]  If  7PM-7AM, please contact night-coverage [email or web address] Password [email or web address] 06/26/2017, 3:29 PM

## 2017-06-27 DIAGNOSIS — R569 Unspecified convulsions: Secondary | ICD-10-CM | POA: Diagnosis not present

## 2017-06-27 DIAGNOSIS — E162 Hypoglycemia, unspecified: Secondary | ICD-10-CM | POA: Diagnosis not present

## 2017-06-27 DIAGNOSIS — E876 Hypokalemia: Secondary | ICD-10-CM

## 2017-06-27 DIAGNOSIS — Z789 Other specified health status: Secondary | ICD-10-CM | POA: Diagnosis not present

## 2017-06-27 LAB — GLUCOSE, CAPILLARY
GLUCOSE-CAPILLARY: 91 mg/dL (ref 65–99)
GLUCOSE-CAPILLARY: 93 mg/dL (ref 65–99)
Glucose-Capillary: 81 mg/dL (ref 65–99)
Glucose-Capillary: 90 mg/dL (ref 65–99)
Glucose-Capillary: 91 mg/dL (ref 65–99)

## 2017-06-27 MED ORDER — PHENYTOIN SODIUM EXTENDED 100 MG PO CAPS: 300.0000 mg | ORAL_CAPSULE | Freq: Every day | ORAL | 0 refills | Status: DC

## 2017-06-27 MED ORDER — THIAMINE HCL 100 MG PO TABS: 100.0000 mg | ORAL_TABLET | Freq: Every day | ORAL | 0 refills | Status: DC

## 2017-06-27 NOTE — Progress Notes (Signed)
Patient was discharged home with home health by MD order; discharged instructions review and give to patient with care notes and prescription; IV DIC; taxi was called and the taxi voucher given to the patient by Child psychotherapistsocial worker; patient will be escorted to the car by nurse tech via wheelchair.

## 2017-06-27 NOTE — Discharge Summary (Signed)
Physician Discharge Summary  Mason Hart Plyler ZOX:[id number] DOB: 11/05/1956 DOA: 06/24/2017  PCP: Lorre MunroeBaity, Regina W, NP  Admit date: 06/24/2017 Discharge date: 06/27/2017   Recommendations for Outpatient Follow-Up:   1. Alcohol cessation 2. Medication compliance 3. Home health RN sulfonylurea panel pending   Discharge Diagnosis:   Principal Problem:   Seizures (HCC) Active Problems:   Noncompliance with medication regimen   Hypokalemia   Hypoglycemia   Alcohol use   Seizure [facility](HCC)   Discharge disposition:  Home.  Discharge Condition: Improved.  Diet recommendation: Low sodium, heart healthy  Wound care: None.   History of Present Illness:   Per admitting Dr: Mason Hart Mason Hart is a 61 y.o. male with medical history significant of seizure, medication noncompliance, alcohol use, who presents with seizure.  Per EMS report, pt was found on the floor with seizure by family member. Pt was found to have CBG 56. No hx of diabetes mellitus.  Pt was post ictal on EMS arrival. Pt got 25g of  D50, CBG improved to 92. When I saw pt in ED, he is oriented x 3. He states that I could not afford her seizure medication Dilantin, and has not taken this medication for several weeks. Patient does not have fever, chills, unilateral weakness, numbness or tingling to extremities. No facial droop, vision change or hearing loss. Patient denies chest pain, shortness of breath, nausea, vomiting, diarrhea or abdominal pain. No symptoms of UTI. Patient states that he drinks beer whenever he has money. Last drink was yesterday.   Hospital Course by Problem:   Seizures (HCC): -likely due to medication noncompliance -Phenytoin level less than 2.5 at admission -has not taken seizure medication for several weeks since he could not afford ($22) - loaded with 1500 mg of Dilantin in ED-- resume home meds-- dilantin: 300 mg daily -CM consult for medication need- [facility]  Tobacco use/alcohol abuse: -Did counseling about the importance of quitting drinking/smoking -no signs of withdrawal  Hypokalemia: K=3.0on admission. - Repleted along with Mg  Hypoglycemia:  Likely due to decreased oral intake and alcohol use,but need to rule out other possibilities -sulfonylurea panel pending, insulin level/C-peptide normal -cortisol around 3.7-- has not been on steroids recently-- recheck shows response to ACTH but will need to be followed as an outpatient      Medical Consultants:    None.   Discharge Exam:   Vitals:   06/27/17 0428 06/27/17 1030  BP: 125/73 138/82  Pulse: 68 84  Resp: 17   Temp: 98.2 F (36.8 C)    Vitals:   06/26/17 1615 06/26/17 2016 06/27/17 0428 06/27/17 1030  BP: 130/66 110/64 125/73 138/82  Pulse: 71 88 68 84  Resp:  17 17   Temp:  98.4 F (36.9 C) 98.2 F (36.8 C)   TempSrc:  Oral Oral   SpO2:  100% 100% 98%  Weight:      Height:        Gen:  NAD   The results of significant diagnostics from this hospitalization (including imaging, microbiology, ancillary and laboratory) are listed below for reference.     Procedures and Diagnostic Studies:   No results found.   Labs:   Basic Metabolic Panel:  Recent Labs Lab 06/24/17 2037 06/25/17 0517  NA 138 139  K 3.0* 4.1  CL 106 105  CO2 22 27  GLUCOSE 72 72  BUN 10 8  CREATININE 0.73 0.66  CALCIUM 8.8* 8.8*  MG  --  1.9   GFR Estimated Creatinine Clearance: 116.8 mL/min (by C-G formula based on SCr of 0.66 mg/dL). Liver Function Tests:  Recent Labs Lab 06/24/17 2037  AST 31  ALT 15*  ALKPHOS 48  BILITOT 0.8  PROT 6.4*  ALBUMIN 2.9*   No results for input(s): LIPASE, AMYLASE in the last 168 hours. No results for input(s): AMMONIA in the last 168 hours. Coagulation profile No results for input(s): INR, PROTIME in the last 168 hours.  CBC:  Recent Labs Lab 06/24/17 2037 06/25/17 0517  WBC 7.1 6.8  HGB 10.2* 10.6*   HCT 31.4* 33.7*  MCV 91.3 92.1  PLT 238 178   Cardiac Enzymes: No results for input(s): CKTOTAL, CKMB, CKMBINDEX, TROPONINI in the last 168 hours. BNP: Invalid input(s): POCBNP CBG:  Recent Labs Lab 06/26/17 1705 06/26/17 2014 06/27/17 0006 06/27/17 0426 06/27/17 0806  GLUCAP 90 133* 91 91 81   D-Dimer No results for input(s): DDIMER in the last 72 hours. Hgb A1c No results for input(s): HGBA1C in the last 72 hours. Lipid Profile No results for input(s): CHOL, HDL, LDLCALC, TRIG, CHOLHDL, LDLDIRECT in the last 72 hours. Thyroid function studies  Recent Labs  06/25/17 1052  TSH 2.077   Anemia work up No results for input(s): VITAMINB12, FOLATE, FERRITIN, TIBC, IRON, RETICCTPCT in the last 72 hours. Microbiology No results found for this or any previous visit (from the past 240 hour(s)).   Discharge Instructions:   Discharge Instructions    Diet general    Complete by:  As directed    Discharge instructions    Complete by:  As directed    Stop alcohol use Eat regular meals through out the day   Increase activity slowly    Complete by:  As directed      Allergies as of 06/27/2017   No Known Allergies     Medication List    STOP taking these medications   UNKNOWN TO PATIENT     TAKE these medications   phenytoin 100 MG ER capsule Commonly known as:  DILANTIN Take 3 capsules (300 mg total) by mouth at bedtime.   thiamine 100 MG tablet Take 1 tablet (100 mg total) by mouth daily.            Durable Medical Equipment        Start     Ordered   06/26/17 1518  For home use only DME Walker rolling  Once    Question:  Patient needs a walker to treat with the following condition  Answer:  Seizure (HCC)   06/26/17 1518     Follow-up Information    [facility]. Go on 07/02/2017.   Why:  post hospital follow up scheduled for  07/02/2017 at 10am with Scot Juniffany Noel NP Contact information: [address] [place] [id number] [phone]           Time coordinating discharge: 35 min  Signed:  JESSICA Juanetta GoslingU VANN   [facility] 06/27/2017, 11:38 AM

## 2017-06-27 NOTE — Evaluation (Signed)
Physical Therapy Evaluation Patient Details Name: Mason Hart MRN: [id number] DOB: 10-10-1956 Today's Date: 06/27/2017   History of Present Illness  Pt is a 61 y/o M presenting with and medical history of seizures, noncompliance with medications and alcohol use.   Clinical Impression  Pt A&Ox4 and agreeable to mobilize with PT and states he was independent with all ADLs and ambulated without AD PTA. Pt states he walked the unit with nursing yesterday with RW and he feels comfortable using it. Pt does not require any physical assistance for mobility and was able to walk good distance today with RW. Pt seems to be at baseline functioning and does not seem to not further PT services; PT to sign off and pt agreeable with this. Would recommend an RN to come for home therapy for medication management, as pt has a history of noncompliance with seizure medication.   Vitals:   Sitting pre ambulation: BP 138/82 HR 84 SpO2 98% on RA  Standing pre ambulation: BP 135/75     Follow Up Recommendations No PT follow up;Other (comment) (Home health RN )    Equipment Recommendations  None recommended by PT    Recommendations for Other Services       Precautions / Restrictions Restrictions Weight Bearing Restrictions: No      Mobility  Bed Mobility Overal bed mobility: Needs Assistance Bed Mobility: Supine to Sit     Supine to sit: Supervision     General bed mobility comments: Supervision for safety and use of bed rail. VCs for scooting bottom to EOB.   Transfers Overall transfer level: Needs assistance   Transfers: Sit to/from Stand Sit to Stand: Supervision         General transfer comment: Supervision for safety. No VCs required for hand placement   Ambulation/Gait Ambulation/Gait assistance: Supervision Ambulation Distance (Feet): 300 Feet Assistive device: Rolling walker (2 wheeled) Gait Pattern/deviations: Step-through pattern;Decreased stride length;Drifts right/left      General Gait Details: No VCs required for sequencing or use of RW. Noticed bowing out of Lt LE and knee stiffness; however gait grossly WFL using RW.   Stairs            Wheelchair Mobility    Modified Rankin (Stroke Patients Only)       Balance Overall balance assessment: Needs assistance Sitting-balance support: No upper extremity supported;Feet unsupported Sitting balance-Leahy Scale: Good     Standing balance support: Bilateral upper extremity supported;During functional activity Standing balance-Leahy Scale: Fair Standing balance comment: Pt uses RW for UE support; however not reliant and ambulated well without imbalance                              Pertinent Vitals/Pain Pain Assessment: No/denies pain    Home Living Family/patient expects to be discharged to:: Private residence Living Arrangements: Alone   Type of Home: House Home Access: Level entry     Home Layout: One level Home Equipment: Environmental consultant - 2 wheels;Cane - single point      Prior Function Level of Independence: Independent         Comments: Pt reports PTA he ambulated without an AD and was independent with all ADLs. He enjoyed walking in the park at times.      Hand Dominance        Extremity/Trunk Assessment   Upper Extremity Assessment Upper Extremity Assessment: Overall WFL for tasks assessed    Lower Extremity Assessment Lower Extremity  Assessment: LLE deficits/detail LLE Deficits / Details: limited Lt knee ROM (flex and extension); however pt compensates during gait and functional activities     Cervical / Trunk Assessment Cervical / Trunk Assessment: Kyphotic  Communication   Communication: No difficulties  Cognition Arousal/Alertness: Awake/alert Behavior During Therapy: WFL for tasks assessed/performed Overall Cognitive Status: No family/caregiver present to determine baseline cognitive functioning                                 General  Comments: Pt A&Ox4       General Comments      Exercises     Assessment/Plan    PT Assessment Patent does not need any further PT services  PT Problem List         PT Treatment Interventions      PT Goals (Current goals can be found in the Care Plan section)  Acute Rehab PT Goals Patient Stated Goal: go home  PT Goal Formulation: With patient Time For Goal Achievement: 07/11/17 Potential to Achieve Goals: Good    Frequency     Barriers to discharge        Co-evaluation               AM-PAC PT "6 Clicks" Daily Activity  Outcome Measure Difficulty turning over in bed (including adjusting bedclothes, sheets and blankets)?: None Difficulty moving from lying on back to sitting on the side of the bed? : None Difficulty sitting down on and standing up from a chair with arms (e.g., wheelchair, bedside commode, etc,.)?: None Help needed moving to and from a bed to chair (including a wheelchair)?: A Little Help needed walking in hospital room?: A Little Help needed climbing 3-5 steps with a railing? : A Little 6 Click Score: 21    End of Session Equipment Utilized During Treatment: Gait belt Activity Tolerance: Patient tolerated treatment well Patient left: in chair;with call bell/phone within reach Nurse Communication: Mobility status;Precautions (Nurse tech notified that pt did not have chair alarm and PT and nurse tech emphasized to pt not to get out of the chair without nursing assistance ) PT Visit Diagnosis: Other abnormalities of gait and mobility (R26.89);Difficulty in walking, not elsewhere classified (R26.2)    Time: 1610-9604 PT Time Calculation (min) (ACUTE ONLY): 27 min   Charges:   PT Evaluation $PT Eval Low Complexity: 1 Low PT Treatments $Gait Training: 8-22 mins   PT G Codes:   PT G-Codes **NOT FOR INPATIENT CLASS** Functional Assessment Tool Used: AM-PAC 6 Clicks Basic Mobility Functional Limitation: Mobility: Walking and moving  around Mobility: Walking and Moving Around Current Status (V4098): At least 20 percent but less than 40 percent impaired, limited or restricted Mobility: Walking and Moving Around Goal Status [phone]): At least 20 percent but less than 40 percent impaired, limited or restricted Mobility: Walking and Moving Around Discharge Status [phone]): At least 20 percent but less than 40 percent impaired, limited or restricted    Arneta Cliche, [facility] [phone]   Arneta Cliche 06/27/2017, 10:51 AM

## 2017-06-27 NOTE — Progress Notes (Signed)
CSW received consult regarding transportation needs. Patient's family has no way to get him and patient walks with a walker. CSW provided cab voucher.  CSW signing off.  Osborne Cascoadia Amore Grater LCSWA [phone]

## 2017-07-02 ENCOUNTER — Inpatient Hospital Stay: Payer: BC Managed Care – PPO

## 2017-07-02 MED FILL — ?PHENYTOIN SOD EXT 100 MG c: 100 | 30 days supply | Qty: 90 | Fill #0

## 2017-07-05 LAB — SULFONYLUREA HYPOGLYCEMICS PANEL, SERUM
Acetohexamide: NEGATIVE ug/mL (ref 20–60)
CHLORPROPAMIDE: NEGATIVE ug/mL (ref 75–250)
GLIMEPIRIDE: NEGATIVE ng/mL (ref 80–250)
GLYBURIDE: NEGATIVE ng/mL
Glipizide: NEGATIVE ng/mL (ref 200–1000)
NATEGLINIDE: NEGATIVE ng/mL
REPAGLINIDE: NEGATIVE ng/mL
Tolazamide: NEGATIVE ug/mL
Tolbutamide: NEGATIVE ug/mL (ref 40–100)

## 2017-07-10 ENCOUNTER — Telehealth: Payer: Self-pay | Admitting: Internal Medicine

## 2017-07-10 NOTE — Telephone Encounter (Signed)
I have not seen this pt in over 4 years

## 2017-07-10 NOTE — Telephone Encounter (Signed)
Caller Name:nicholette brown Relationship to Patient:[facility] Best [phone] Pharmacy:  Reason for call:  Request orders for home health skilled nursing 1x week for 4 weeks for, disease mgmt, home safety, diet, fall risk.   Also request OT, PT, and medical social work consult.

## 2017-07-10 NOTE — Telephone Encounter (Signed)
[facility]

## 2017-07-12 ENCOUNTER — Ambulatory Visit: Payer: BC Managed Care – PPO | Attending: Internal Medicine | Admitting: Physician Assistant

## 2017-07-12 VITALS — BP 143/77 | HR 73 | Temp 98.8°F | Resp 18 | Ht 73.0 in | Wt 216.0 lb

## 2017-07-12 DIAGNOSIS — F1010 Alcohol abuse, uncomplicated: Secondary | ICD-10-CM | POA: Diagnosis not present

## 2017-07-12 DIAGNOSIS — Z9114 Patient's other noncompliance with medication regimen: Secondary | ICD-10-CM | POA: Insufficient documentation

## 2017-07-12 DIAGNOSIS — Z09 Encounter for follow-up examination after completed treatment for conditions other than malignant neoplasm: Secondary | ICD-10-CM | POA: Diagnosis not present

## 2017-07-12 DIAGNOSIS — R030 Elevated blood-pressure reading, without diagnosis of hypertension: Secondary | ICD-10-CM | POA: Insufficient documentation

## 2017-07-12 DIAGNOSIS — R569 Unspecified convulsions: Secondary | ICD-10-CM

## 2017-07-12 DIAGNOSIS — G40909 Epilepsy, unspecified, not intractable, without status epilepticus: Secondary | ICD-10-CM | POA: Diagnosis not present

## 2017-07-12 MED ORDER — PHENYTOIN SODIUM EXTENDED 100 MG PO CAPS: 300.0000 mg | ORAL_CAPSULE | Freq: Every day | ORAL | 3 refills | Status: DC

## 2017-07-12 NOTE — Progress Notes (Signed)
Patient is here for hospital f/up  

## 2017-07-12 NOTE — Progress Notes (Signed)
Bartlett Enke  YQM:[id number]  BMW:[id number]  DOB - 1955/12/02  Chief Complaint  Patient presents with  . Hospitalization Follow-up       Subjective:   Mason Hart is a 61 y.o. male here today for establishment of care. He has a hx of ETOH abuse, Seizures, and noncompliance.  His seizures dating back to at least 2013. Monthly for the last 3 months he's been in the hospital after a seizure. He has been noncompliant with his Dilantin therapy. Each time he arrives his Dilantin level was subtherapeutic. He usually responds to a bolus and is sent out with an outpatient prescription and told to follow-up here. This is her first time seeing him after his recent hospitalization 06/24/17- 06/27/2017. At that time he was found after a seizure. His blood glucose level was 56. He had been off his Dilantin for at least 2 weeks secondary to cost. The Dilantin level was less than 2.5. His potassium was low at 3.0. His creatinine was normal. He was loaded with 1500 mg of Dilantin and admitted by the internal medicine team. He is now maintained on Dilantin 300 mg nightly. No more seizures since leaving the hospital. Workup nonrevealing in the hospital. No acute complaints today.  ROS: GEN: denies fever or chills, denies change in weight Skin: denies lesions or rashes HEENT: denies headache, earache, epistaxis, sore throat, or neck pain LUNGS: denies SHOB, dyspnea, PND, orthopnea CV: denies CP or palpitations ABD: denies abd pain, N or V EXT: denies muscle spasms or swelling; no pain in lower ext, no weakness NEURO: denies numbness or tingling, denies sz, stroke or TIA  ALLERGIES: No Known Allergies  PAST MEDICAL HISTORY: Past Medical History:  Diagnosis Date  . Noncompliance with medication regimen   . Seizures (HCC)     PAST SURGICAL HISTORY: No past surgical history on file.  MEDICATIONS AT HOME: Prior to Admission medications   Medication Sig Start Date End Date Taking? Authorizing  Provider  phenytoin (DILANTIN) 100 MG ER capsule Take 3 capsules (300 mg total) by mouth at bedtime. 07/27/17  Yes Danelle Earthly, Tiffany S, PA-C  thiamine 100 MG tablet Take 1 tablet (100 mg total) by mouth daily. Patient not taking: Reported on 07/12/2017 06/27/17   Joseph Art, DO    Family History  Problem Relation Age of Onset  . Heart disease Mother   . Cancer Neg Hx   . Diabetes Neg Hx   . Stroke Neg Hx    Social-unmarried, has a girlfriend, previously worked with the school system, nonsmoker, +ETOH  Objective:   Vitals:   07/12/17 0832  BP: (!) 143/77  Pulse: 73  Resp: 18  Temp: 98.8 F (37.1 C)  TempSrc: Oral  SpO2: 98%  Weight: 216 lb (98 kg)  Height: 6\' 1"  (1.854 m)    Exam General appearance : Awake, alert, not in any distress. Speech Clear. Not toxic looking HEENT: Atraumatic and Normocephalic, pupils equally reactive to light and accomodation Neck: supple, no JVD. No cervical lymphadenopathy.  Chest:Good air entry bilaterally, no added sounds  CVS: S1 S2 regular, no murmurs.  Abdomen: Bowel sounds present, Non tender and not distended with no guarding, rigidity or rebound. Extremities: B/L Lower Ext shows no edema, both legs are warm to touch Neurology: Awake alert, and oriented X 3, CN II-XII intact, Non focal Skin:No Rash Wounds:N/A  Data Review Lab Results  Component Value Date   HGBA1C 4.5 (L) 12/09/2012     Assessment & Plan  1.  Seizure D/O  -refilled Dilantin 300 mg qhs  -Neuro referral once compliance is displayed  -Avoid triggers, ETOH cessation 2. Elevated Blood Pressure  -DASH diet  -recheck at next visit 3. ETOH abuse  -cessation encouraged   Return in about 4 weeks (around 08/09/2017).  The patient was given clear instructions to go to ER or return to medical center if symptoms don't improve, worsen or new problems develop. The patient verbalized understanding. The patient was told to call to get lab results if they haven't heard anything  in the next week.   Total time spent with patient was 22 min. Greater than 50 % of this visit was spent face to face counseling and coordinating care regarding risk factor modification, compliance importance and encouragement, education related to seizures and blood pressure.  This note has been created with Education officer, environmentalDragon speech recognition software and smart phrase technology. Any transcriptional errors are unintentional.    Scot Juniffany Noel, PA-C [facility] [place], [place] [phone]   07/12/2017, 8:50 AM

## 2017-07-12 NOTE — Patient Instructions (Signed)
Epilepsy °Epilepsy is a condition in which a person has repeated seizures over time. A seizure is a sudden burst of abnormal electrical and chemical activity in the brain. Seizures can cause a change in attention, behavior, or the ability to remain awake and alert (altered mental status). °Epilepsy increases a person's risk of falls, accidents, and injury. It can also lead to complications, including: °· Depression. °· Poor memory. °· Sudden unexplained death in epilepsy (SUDEP). This complication is rare, and its cause is not known. ° °Most people with epilepsy lead normal lives. °What are the causes? °This condition may be caused by: °· A head injury. °· An injury that happens at birth. °· A high fever during childhood. °· A stroke. °· Bleeding that goes into or around the brain. °· Certain medicines and drugs. °· Having too little oxygen for a long period of time. °· Abnormal brain development. °· Certain infections, such as meningitis and encephalitis. °· Brain tumors. °· Conditions that are passed along from parent to child (are hereditary). ° °What are the signs or symptoms? °Symptoms of a seizure vary greatly from person to person. They include: °· Convulsions. °· Stiffening of the body. °· Involuntary movements of the arms or legs. °· Loss of consciousness. °· Breathing problems. °· Falling suddenly. °· Confusion. °· Head nodding. °· Eye blinking or fluttering. °· Lip smacking. °· Drooling. °· Rapid eye movements. °· Grunting. °· Loss of bladder control and bowel control. °· Staring. °· Unresponsiveness. ° °Some people have symptoms right before a seizure happens (aura) and right after a seizure happens. Symptoms of an aura include: °· Fear or anxiety. °· Nausea. °· Feeling like the room is spinning (vertigo). °· A feeling of having seen or heard something before (deja vu). °· Odd tastes or smells. °· Changes in vision, such as seeing flashing lights or spots. ° °Symptoms that follow a seizure  include: °· Confusion. °· Sleepiness. °· Headache. ° °How is this diagnosed? °This condition is diagnosed based on: °· Your symptoms. °· Your medical history. °· A physical exam. °· A neurological exam. A neurological exam is similar to a physical exam. It involves checking your strength, reflexes, coordination, and sensations. °· Tests, such as: °? An electroencephalogram (EEG). This is a painless test that creates a diagram of your brain waves. °? An MRI of the brain. °? A CT scan of the brain. °? A lumbar puncture, also called a spinal tap. °? Blood tests to check for signs of infection or abnormal blood chemistry. ° °How is this treated? °There is no cure for this condition, but treatment can help control seizures. Treatment may involve: °· Taking medicines to control seizures. These include medicines to prevent seizures and medicines to stop seizures as they occur. °· Having a device called a vagus nerve stimulator implanted in the chest. The device sends electrical impulses to the vagus nerve and to the brain to prevent seizures. This treatment may be recommended if medicines do not help. °· Brain surgery. There are several kinds of surgeries that may be done to stop seizures from happening or to reduce how often seizures happen. °· Having regular blood tests. You may need to have blood tests regularly to check that you are getting the right amount of medicine. ° °Once this condition has been diagnosed, it is important to begin treatment as soon as possible. For some people, epilepsy eventually goes away. °Follow these instructions at home: °Medicines ° °· Take over-the-counter and prescription medicines only as   told by your health care provider. °· Avoid any substances that may prevent your medicine from working properly, such as alcohol. °Activity °· Get enough rest. Lack of sleep can make seizures more likely to occur. °· Follow instructions from your health care provider about driving, swimming, and doing  any other activities that would be dangerous if you had a seizure. °Educating others °Teach friends and family what to do if you have a seizure. They should: °· Lay you on the ground to prevent a fall. °· Cushion your head and body. °· Loosen any tight clothing around your neck. °· Turn you on your side. If vomiting occurs, this helps keep your airway clear. °· Stay with you until you recover. °· Not hold you down. Holding you down will not stop the seizure. °· Not put anything in your mouth. °· Know whether or not you need emergency care. ° °General instructions °· Avoid anything that has ever triggered a seizure for you. °· Keep a seizure diary. Record what you remember about each seizure, especially anything that might have triggered the seizure. °· Keep all follow-up visits as told by your health care provider. This is important. °Contact a health care provider if: °· Your seizure pattern changes. °· You have symptoms of infection or another illness. This might increase your risk of having a seizure. °Get help right away if: °· You have a seizure that does not stop after 5 minutes. °· You have several seizures in a row without a complete recovery in between seizures. °· You have a seizure that makes it harder to breathe. °· You have a seizure that is different from previous seizures. °· You have a seizure that leaves you unable to speak or use a part of your body. °· You did not wake up immediately after a seizure. °This information is not intended to replace advice given to you by your health care provider. Make sure you discuss any questions you have with your health care provider. °Document Released: 11/13/2005 Document Revised: 06/10/2016 Document Reviewed: 05/23/2016 °Elsevier Interactive Patient Education © 2018 Elsevier Inc. ° °

## 2017-07-17 ENCOUNTER — Telehealth: Payer: Self-pay | Admitting: *Deleted

## 2017-07-17 NOTE — Telephone Encounter (Signed)
Mason Hart with [facility] left a voicemail stating that she went out to see the patient and noticed that there were bed bugs crawling on him. Mason Hart stated that they will be putting their services on hold until he can have an exterminator come out and get rid of the bed bugs. Any questions you can call her back.

## 2017-07-17 NOTE — Telephone Encounter (Signed)
Refer to telephone note from 07/10/17- I told the company that pt has not been seen in over 4 years that we would not be handling orders to [employer]

## 2017-07-17 NOTE — Telephone Encounter (Signed)
I have not seen this pt in over 4 years

## 2017-08-09 ENCOUNTER — Encounter: Payer: Self-pay | Admitting: Family Medicine

## 2017-08-09 ENCOUNTER — Ambulatory Visit: Payer: BC Managed Care – PPO | Attending: Family Medicine | Admitting: Family Medicine

## 2017-08-09 ENCOUNTER — Other Ambulatory Visit: Payer: Self-pay

## 2017-08-09 VITALS — BP 118/72 | HR 70 | Temp 98.4°F | Resp 18 | Ht 73.0 in | Wt 212.2 lb

## 2017-08-09 DIAGNOSIS — Z7289 Other problems related to lifestyle: Secondary | ICD-10-CM

## 2017-08-09 DIAGNOSIS — Z0000 Encounter for general adult medical examination without abnormal findings: Secondary | ICD-10-CM

## 2017-08-09 DIAGNOSIS — R9431 Abnormal electrocardiogram [ECG] [EKG]: Secondary | ICD-10-CM

## 2017-08-09 DIAGNOSIS — I10 Essential (primary) hypertension: Secondary | ICD-10-CM | POA: Diagnosis present

## 2017-08-09 DIAGNOSIS — Z1211 Encounter for screening for malignant neoplasm of colon: Secondary | ICD-10-CM | POA: Diagnosis not present

## 2017-08-09 DIAGNOSIS — I4510 Unspecified right bundle-branch block: Secondary | ICD-10-CM

## 2017-08-09 DIAGNOSIS — Z789 Other specified health status: Secondary | ICD-10-CM

## 2017-08-09 DIAGNOSIS — G40909 Epilepsy, unspecified, not intractable, without status epilepticus: Secondary | ICD-10-CM

## 2017-08-09 DIAGNOSIS — Z23 Encounter for immunization: Secondary | ICD-10-CM

## 2017-08-09 LAB — POCT UA - MICROALBUMIN
Albumin/Creatinine Ratio, Urine, POC: <30
CREATININE, POC: 300 mg/dL
Microalbumin Ur, POC: 30 mg/L

## 2017-08-09 MED ORDER — PHENYTOIN SODIUM EXTENDED 100 MG PO CAPS: 300.0000 mg | ORAL_CAPSULE | Freq: Every day | ORAL | 3 refills | Status: DC

## 2017-08-09 MED ORDER — AMLODIPINE BESYLATE 5 MG PO TABS: 5.0000 mg | ORAL_TABLET | Freq: Every day | ORAL | 2 refills | Status: DC

## 2017-08-09 MED ORDER — CLONIDINE HCL 0.2 MG PO TABS
0.2000 mg | ORAL_TABLET | Freq: Once | ORAL | Status: AC
  Administered 2017-08-09: 0.2 mg via ORAL

## 2017-08-09 MED ORDER — THIAMINE HCL 100 MG PO TABS: 100.0000 mg | ORAL_TABLET | Freq: Every day | ORAL | 6 refills | Status: DC

## 2017-08-09 MED ORDER — LOSARTAN POTASSIUM 50 MG PO TABS: 50.0000 mg | ORAL_TABLET | Freq: Every day | ORAL | 2 refills | Status: DC

## 2017-08-09 NOTE — Progress Notes (Signed)
Patient is here for 4 week f/up   Patient needs refill on phenytoin

## 2017-08-09 NOTE — Patient Instructions (Signed)

## 2017-08-09 NOTE — Progress Notes (Signed)
Subjective:  Patient ID: Mason Hart, male    DOB: 1956/03/05  Age: 61 y.o. MRN: [id number]  CC: Follow-up   HPI Mason Hart presents for follow up for  seizure disorder . PMH of ETOH abuse, seizures, and non-adherence. Episodes of seizure since at least 2013. He denies any seizures since last office visit. He reports adherence with dilantin since last office visit. BP found to be elevated in office. Family history of HTN-brother, MGM, MGF.  He is not exercising and is not adherent to low salt diet.  He does not check BP at home. Cardiac symptoms none. Patient denies chest pain, chest pressure/discomfort, claudication, lower extremity edema, near-syncope and syncope.  Cardiovascular risk factors: hypertension, male gender, sedentary lifestyle and alcohol use. Use of agents associated with hypertension: none. History of target organ damage: none. History of ETOH abuse he declines to speak with LCSW or referral at this time.  Outpatient Medications Prior to Visit  Medication Sig Dispense Refill  . phenytoin (DILANTIN) 100 MG ER capsule Take 3 capsules (300 mg total) by mouth at bedtime. 90 capsule 3  . thiamine 100 MG tablet Take 1 tablet (100 mg total) by mouth daily. (Patient not taking: Reported on 07/12/2017) 30 tablet 0   No facility-administered medications prior to visit.     ROS Review of Systems  Constitutional: Negative.   Eyes: Negative.   Respiratory: Negative.   Cardiovascular: Negative.   Gastrointestinal: Negative.   Skin: Negative.   Neurological:       History of seizure disorder  Psychiatric/Behavioral: Positive for behavioral problems (ETOH abuse). Negative for suicidal ideas.   Objective:  BP (!) 173/100 (BP Location: Left Arm, Patient Position: Sitting, Cuff Size: Normal)   Pulse 70   Temp 98.4 F (36.9 C) (Oral)   Resp 18   Ht 6\' 1"  (1.854 m)   Wt 212 lb 3.2 oz (96.3 kg)   SpO2 98%   BMI 28.00 kg/m   BP/Weight 08/09/2017 07/12/2017 06/27/2017  Systolic BP  173 191 138  Diastolic BP 100 77 82  Wt. (Lbs) 212.2 216 -  BMI 28 28.5 -     Physical Exam  Constitutional: He appears well-developed and well-nourished.  HENT:  Head: Normocephalic and atraumatic.  Right Ear: External ear normal.  Left Ear: External ear normal.  Nose: Nose normal.  Mouth/Throat: Oropharynx is clear and moist.  Eyes: Pupils are equal, round, and reactive to light. Conjunctivae are normal.  Neck: No JVD present.  Cardiovascular: Normal rate, regular rhythm, normal heart sounds and intact distal pulses.   Pulmonary/Chest: Effort normal and breath sounds normal.  Abdominal: Soft. Bowel sounds are normal. There is no tenderness.  Skin: Skin is warm and dry.  Psychiatric: He expresses no homicidal and no suicidal ideation. He expresses no suicidal plans and no homicidal plans.  Nursing note and vitals reviewed.   Assessment & Plan:   1. Hypertension, unspecified type -Schedule BP recheck in 2 weeks with clinic RN. -If BP is greater than 90/60 (MAP 65 or greater) but not less than 130/80 may increase dose of amldoipine and recheck in another 2 weeks with clinic RN.  - cloNIDine (CATAPRES) tablet 0.2 mg; Take 1 tablet (0.2 mg total) by mouth once. - EKG 12-Lead - POCT UA - Microalbumin - CMP and Liver - Lipid Panel - losartan (COZAAR) 50 MG tablet; Take 1 tablet (50 mg total) by mouth daily.  Dispense: 30 tablet; Refill: 2 - amLODipine (NORVASC) 5 MG tablet; Take 1  tablet (5 mg total) by mouth daily.  Dispense: 30 tablet; Refill: 2  2. Seizure disorder (HCC)  - Phenytoin level, free and total  3. Alcohol use  - thiamine 100 MG tablet; Take 1 tablet (100 mg total) by mouth daily.  Dispense: 30 tablet; Refill: 6  4. Needs flu shot  - Flu Vaccine QUAD 6+ mos PF IM (Fluarix Quad PF)  5. Health care maintenance  - Hepatitis C Antibody  6. Screen for colon cancer  - Ambulatory referral to Gastroenterology  7. RBBB (right bundle branch block)  -  ECHOCARDIOGRAM COMPLETE; Future  8. Uncontrolled hypertension -EKG 12 Lead - ECHOCARDIOGRAM COMPLETE; Future  9. Abnormal ECG -EKG 12 Lead - ECHOCARDIOGRAM COMPLETE; Future   Meds ordered this encounter  Medications  . cloNIDine (CATAPRES) tablet 0.2 mg  . DISCONTD: phenytoin (DILANTIN) 100 MG ER capsule    Sig: Take 3 capsules (300 mg total) by mouth at bedtime.    Dispense:  90 capsule    Refill:  3    Order Specific Question:   Supervising Provider    Answer:   Quentin Angst [id number]  . thiamine 100 MG tablet    Sig: Take 1 tablet (100 mg total) by mouth daily.    Dispense:  30 tablet    Refill:  6    Order Specific Question:   Supervising Provider    Answer:   Quentin Angst [id number]  . losartan (COZAAR) 50 MG tablet    Sig: Take 1 tablet (50 mg total) by mouth daily.    Dispense:  30 tablet    Refill:  2    Order Specific Question:   Supervising Provider    Answer:   Quentin Angst [id number]  . amLODipine (NORVASC) 5 MG tablet    Sig: Take 1 tablet (5 mg total) by mouth daily.    Dispense:  30 tablet    Refill:  2    Order Specific Question:   Supervising Provider    Answer:   Quentin Angst [id number]    Follow-up: Return in about 2 weeks (around 08/23/2017) for BP check with [facility]ia.   [name] [name]

## 2017-08-10 LAB — CMP AND LIVER
ALBUMIN: 3.5 g/dL — AB (ref 3.6–4.8)
ALK PHOS: 78 IU/L (ref 39–117)
ALT: 8 IU/L (ref 0–44)
AST: 17 IU/L (ref 0–40)
BUN: 7 mg/dL — AB (ref 8–27)
Bilirubin Total: 0.4 mg/dL (ref 0.0–1.2)
Bilirubin, Direct: 0.15 mg/dL (ref 0.00–0.40)
CO2: 24 mmol/L (ref 20–29)
CREATININE: 0.72 mg/dL — AB (ref 0.76–1.27)
Calcium: 9.2 mg/dL (ref 8.6–10.2)
Chloride: 97 mmol/L (ref 96–106)
GFR calc Af Amer: 115 mL/min/{1.73_m2} (ref >59)
GFR, EST NON AFRICAN AMERICAN: 99 mL/min/{1.73_m2} (ref >59)
GLUCOSE: 86 mg/dL (ref 65–99)
POTASSIUM: 3.9 mmol/L (ref 3.5–5.2)
Sodium: 135 mmol/L (ref 134–144)
Total Protein: 7.5 g/dL (ref 6.0–8.5)

## 2017-08-10 LAB — LIPID PANEL
CHOLESTEROL TOTAL: 163 mg/dL (ref 100–199)
Chol/HDL Ratio: 2.1 ratio (ref 0.0–5.0)
HDL: 79 mg/dL (ref >39)
LDL CALC: 73 mg/dL (ref 0–99)
Triglycerides: 55 mg/dL (ref 0–149)
VLDL CHOLESTEROL CAL: 11 mg/dL (ref 5–40)

## 2017-08-10 LAB — HEPATITIS C ANTIBODY: Hep C Virus Ab: <0.1 s/co ratio (ref 0.0–0.9)

## 2017-08-10 LAB — PHENYTOIN LEVEL, FREE AND TOTAL
PHENYTOIN FREE: NOT DETECTED ug/mL (ref 1.0–2.0)
PHENYTOIN, SERUM: 3.4 ug/mL — AB (ref 10.0–20.0)

## 2017-08-13 ENCOUNTER — Telehealth: Payer: Self-pay | Admitting: Family Medicine

## 2017-08-13 NOTE — Telephone Encounter (Signed)
Enrique Sack form [facility] called stating she needs a prior authorization for the pt. That has an appt. On 08/14/17. Rep. Stated that she needs the prior auth today by 2:00 pm. Arna Medici the referral coordinator is out of the office for two weeks. Enrique Sack can be reached at  [phone] ext. [phone]. Please f/u

## 2017-08-14 ENCOUNTER — Ambulatory Visit (HOSPITAL_COMMUNITY): Admission: RE | Admit: 2017-08-14 | Payer: BC Managed Care – PPO | Source: Ambulatory Visit

## 2017-08-15 ENCOUNTER — Other Ambulatory Visit: Payer: Self-pay | Admitting: Family Medicine

## 2017-08-15 DIAGNOSIS — G40909 Epilepsy, unspecified, not intractable, without status epilepticus: Secondary | ICD-10-CM

## 2017-08-15 MED ORDER — PHENYTOIN SODIUM EXTENDED 100 MG PO CAPS: 400.0000 mg | ORAL_CAPSULE | Freq: Every day | ORAL | 3 refills | Status: DC

## 2017-08-15 NOTE — Telephone Encounter (Signed)
CMA call regarding lab results    Patient did not answer but wife son answer & left a message regarding to call back at the office for his lab results

## 2017-08-15 NOTE — Telephone Encounter (Signed)
-----   Message from [name], [name] sent at 08/15/2017  3:05 PM EDT ----- Dilantin levels in blood are decreased. Your dosage of dilantin will be increased. Take your seizure medication everyday. Recommend lab visit to recheck levels in 3 weeks.  Liver function normal Kidney function normal Increase water and protein intake. Good sources of protein include lean meats, beans, nuts, and eggs.  Hepatitis C is negative.

## 2017-08-21 NOTE — Telephone Encounter (Signed)
Mason Hart with [facility] calling about fax that was sent for pts orders. I advised tina about 07/17/17 note and Mason Hart will ck with one of the other 3 physicians that are listed for pt.

## 2018-01-24 ENCOUNTER — Emergency Department (HOSPITAL_COMMUNITY)
Admission: EM | Admit: 2018-01-24 | Discharge: 2018-01-24 | Disposition: A | Discharge status: Home / Self Care | Payer: BC Managed Care – PPO | Attending: Physician Assistant | Admitting: Physician Assistant

## 2018-01-24 ENCOUNTER — Other Ambulatory Visit: Payer: Self-pay

## 2018-01-24 ENCOUNTER — Emergency Department (HOSPITAL_COMMUNITY): Payer: BC Managed Care – PPO

## 2018-01-24 ENCOUNTER — Encounter (HOSPITAL_COMMUNITY): Payer: Self-pay

## 2018-01-24 DIAGNOSIS — G40909 Epilepsy, unspecified, not intractable, without status epilepticus: Secondary | ICD-10-CM

## 2018-01-24 DIAGNOSIS — Z79899 Other long term (current) drug therapy: Secondary | ICD-10-CM | POA: Diagnosis not present

## 2018-01-24 DIAGNOSIS — R569 Unspecified convulsions: Secondary | ICD-10-CM | POA: Diagnosis not present

## 2018-01-24 LAB — CBC
HCT: 31.7 % — ABNORMAL LOW (ref 39.0–52.0)
Hemoglobin: 10.3 g/dL — ABNORMAL LOW (ref 13.0–17.0)
MCH: 29.7 pg (ref 26.0–34.0)
MCHC: 32.5 g/dL (ref 30.0–36.0)
MCV: 91.4 fL (ref 78.0–100.0)
PLATELETS: 257 10*3/uL (ref 150–400)
RBC: 3.47 MIL/uL — ABNORMAL LOW (ref 4.22–5.81)
RDW: 14.5 % (ref 11.5–15.5)
WBC: 7.2 10*3/uL (ref 4.0–10.5)

## 2018-01-24 LAB — CBG MONITORING, ED
GLUCOSE-CAPILLARY: 101 mg/dL — AB (ref 65–99)
GLUCOSE-CAPILLARY: 54 mg/dL — AB (ref 65–99)
GLUCOSE-CAPILLARY: 99 mg/dL (ref 65–99)

## 2018-01-24 LAB — COMPREHENSIVE METABOLIC PANEL
ALT: 14 U/L — ABNORMAL LOW (ref 17–63)
ANION GAP: 17 — AB (ref 5–15)
AST: 32 U/L (ref 15–41)
Albumin: 3.2 g/dL — ABNORMAL LOW (ref 3.5–5.0)
Alkaline Phosphatase: 65 U/L (ref 38–126)
BUN: 15 mg/dL (ref 6–20)
CHLORIDE: 106 mmol/L (ref 101–111)
CO2: 17 mmol/L — ABNORMAL LOW (ref 22–32)
Calcium: 8.7 mg/dL — ABNORMAL LOW (ref 8.9–10.3)
Creatinine, Ser: 0.87 mg/dL (ref 0.61–1.24)
GFR calc Af Amer: >60 mL/min (ref >60)
GFR calc non Af Amer: >60 mL/min (ref >60)
GLUCOSE: 56 mg/dL — AB (ref 65–99)
POTASSIUM: 3.7 mmol/L (ref 3.5–5.1)
SODIUM: 140 mmol/L (ref 135–145)
TOTAL PROTEIN: 7.1 g/dL (ref 6.5–8.1)
Total Bilirubin: 0.8 mg/dL (ref 0.3–1.2)

## 2018-01-24 LAB — URINALYSIS, ROUTINE W REFLEX MICROSCOPIC
BILIRUBIN URINE: NEGATIVE
Glucose, UA: NEGATIVE mg/dL
Hgb urine dipstick: NEGATIVE
KETONES UR: NEGATIVE mg/dL
LEUKOCYTES UA: NEGATIVE
NITRITE: NEGATIVE
PH: 5 (ref 5.0–8.0)
Protein, ur: NEGATIVE mg/dL
SPECIFIC GRAVITY, URINE: 1.017 (ref 1.005–1.030)

## 2018-01-24 LAB — BASIC METABOLIC PANEL
Anion gap: 12 (ref 5–15)
BUN: 15 mg/dL (ref 6–20)
CALCIUM: 8.7 mg/dL — AB (ref 8.9–10.3)
CHLORIDE: 105 mmol/L (ref 101–111)
CO2: 21 mmol/L — ABNORMAL LOW (ref 22–32)
CREATININE: 0.89 mg/dL (ref 0.61–1.24)
Glucose, Bld: 62 mg/dL — ABNORMAL LOW (ref 65–99)
Potassium: 3.7 mmol/L (ref 3.5–5.1)
SODIUM: 138 mmol/L (ref 135–145)

## 2018-01-24 LAB — ETHANOL: Alcohol, Ethyl (B): <10 mg/dL (ref <10)

## 2018-01-24 LAB — PHENYTOIN LEVEL, TOTAL: Phenytoin Lvl: <2.5 ug/mL — ABNORMAL LOW (ref 10.0–20.0)

## 2018-01-24 MED ORDER — DEXTROSE-NACL 5-0.45 % IV SOLN
INTRAVENOUS | Status: DC
Start: 2018-01-24 — End: 2018-01-24

## 2018-01-24 MED ORDER — SODIUM CHLORIDE 0.9 % IV SOLN
1500.0000 mg | Freq: Once | INTRAVENOUS | Status: AC
  Administered 2018-01-24: 1500 mg via INTRAVENOUS
  Filled 2018-01-24: qty 30

## 2018-01-24 MED ORDER — ADULT MULTIVITAMIN W/MINERALS CH: 1.0000 | ORAL_TABLET | Freq: Once | ORAL | Status: DC

## 2018-01-24 MED ORDER — SODIUM CHLORIDE 0.9 % IV BOLUS (SEPSIS)
1000.0000 mL | Freq: Once | INTRAVENOUS | Status: AC
  Administered 2018-01-24: 1000 mL via INTRAVENOUS

## 2018-01-24 MED ORDER — THIAMINE HCL 100 MG/ML IJ SOLN: 100.0000 mg | Freq: Once | INTRAMUSCULAR | Status: DC

## 2018-01-24 MED ORDER — PHENYTOIN SODIUM EXTENDED 100 MG PO CAPS: 400.0000 mg | ORAL_CAPSULE | Freq: Every day | ORAL | 0 refills | Status: DC

## 2018-01-24 MED ORDER — FOLIC ACID 1 MG PO TABS: 1.0000 mg | ORAL_TABLET | Freq: Once | ORAL | Status: DC

## 2018-01-24 NOTE — ED Notes (Signed)
CBG 114, pt now axox4.

## 2018-01-24 NOTE — ED Provider Notes (Signed)
Pt signed out by Dr. Corlis LeakMackuen pending dilantin load.  Pt is feeling better.  He requests rx for dilantin which he will be given.  He knows to return if worse and to f/u with pcp.   Jacalyn LefevreHaviland, Eldred Sooy, MD 01/24/18 [phone]

## 2018-01-24 NOTE — ED Notes (Signed)
Dr Corlis LeakMackuen in room. Pt given two cups of orange juices and tolerated well. Dr Corlis LeakMackuen removed c-collar. Pt denies any pain and has no complaints. Pt states that he takes 1 pill every day, last taken yesterday, but does not know what pill it is.

## 2018-01-24 NOTE — ED Provider Notes (Signed)
[facility]   CSN: [id number] Arrival date & time: 01/24/18  1334     History   Chief Complaint Chief Complaint  Patient presents with  . Seizures    HPI Mason CarpenJohn Hart is a 62 y.o. male.  HPI  Patient is a 62 year old male presenting with seizure.  Patient has known seizure disorder.  Patient on Dilantin.  Every time previously for seizures his Dilantin has been undetectable.  Patient today states that he has been taking his medications although he does not member the name dosage or when the last time he took it.  Patient was noted also to have a low glucose here, patient was sitting down to eat when he had a seizure.  He has no history of diabetes.  Patient denies any alcohol use.  Past Medical History:  Diagnosis Date  . Noncompliance with medication regimen   . Seizures [facility](HCC)     Patient Active Problem List   Diagnosis Date Noted  . Hypokalemia 06/24/2017  . Hypoglycemia 06/24/2017  . Alcohol use 06/24/2017  . Seizure (HCC) 06/24/2017  . Noncompliance with medication regimen   . Seizures (HCC)     History reviewed. No pertinent surgical history.     Home Medications    Prior to Admission medications   Medication Sig Start Date End Date Taking? Authorizing Provider  amLODipine (NORVASC) 5 MG tablet Take 1 tablet (5 mg total) by mouth daily. 08/09/17   [name], [name], [name]  losartan (COZAAR) 50 MG tablet Take 1 tablet (50 mg total) by mouth daily. 08/09/17   [name], [name], [name]  phenytoin (DILANTIN) 100 MG ER capsule Take 4 capsules (400 mg total) by mouth at bedtime. 08/15/17   [name], [name], [name]  thiamine 100 MG tablet Take 1 tablet (100 mg total) by mouth daily. 08/09/17   [name], [name], [name]    Family History Family History  Problem Relation Age of Onset  . Heart disease Mother   . Cancer Neg Hx   . Diabetes Neg Hx   . Stroke Neg Hx     Social History Social History    Tobacco Use  . Smoking status: Never Smoker  . Smokeless tobacco: Never Used  Substance Use Topics  . Alcohol use: Yes    Alcohol/week: 4.2 oz    Types: 7 Cans of beer per week    Comment: occassionally  . Drug use: No     Allergies   Patient has no known allergies.   Review of Systems Review of Systems  Constitutional: Negative for activity change.  Respiratory: Negative for shortness of breath.   Cardiovascular: Negative for chest pain.  Gastrointestinal: Negative for abdominal pain.  Neurological: Positive for seizures. Negative for dizziness, weakness and numbness.  All other systems reviewed and are negative.    Physical Exam Updated Vital Signs BP (!) 160/87   Pulse (!) 103   Temp 97.6 F (36.4 C)   Resp 17   Ht 6\' 1"  (1.854 m)   Wt 96.2 kg (212 lb)   SpO2 97%   BMI 27.97 kg/m   Physical Exam  Constitutional: He is oriented to person, place, and time. He appears well-nourished.  HENT:  Head: Normocephalic.  Eyes: Conjunctivae are normal.  Cardiovascular: Normal rate and regular rhythm.  Pulmonary/Chest: Effort normal and breath sounds normal. No respiratory distress.  Abdominal: Soft. He exhibits no distension. There is no tenderness.  Neurological: He is oriented to person, place,  and time. No cranial nerve deficit. Coordination normal.  Patient with slow cognition.  No focal neuro deficit.  Skin: Skin is warm and dry. He is not diaphoretic.  Psychiatric: He has a normal mood and affect. His behavior is normal.     ED Treatments / Results  Labs (all labs ordered are listed, but only abnormal results are displayed) Labs Reviewed  BASIC METABOLIC PANEL - Abnormal; Notable for the following components:      Result Value   CO2 21 (*)    Glucose, Bld 62 (*)    Calcium 8.7 (*)    All other components within normal limits  CBC - Abnormal; Notable for the following components:   RBC 3.47 (*)    Hemoglobin 10.3 (*)    HCT 31.7 (*)    All other  components within normal limits  CBG MONITORING, ED - Abnormal; Notable for the following components:   Glucose-Capillary 54 (*)    All other components within normal limits  CBG MONITORING, ED - Abnormal; Notable for the following components:   Glucose-Capillary 101 (*)    All other components within normal limits  COMPREHENSIVE METABOLIC PANEL  PHENYTOIN LEVEL, TOTAL  ETHANOL    EKG  EKG Interpretation  Date/Time:  Thursday January 24 2018 13:42:34 EST Ventricular Rate:  106 PR Interval:    QRS Duration: 142 QT Interval:  375 QTC Calculation: 498 R Axis:   81 Text Interpretation:  Sinus tachycardia Right bundle branch block RBB unchanged from prior Confirmed by Corlis Leak, Ladarius Seubert ([id number]) on 01/24/2018 2:53:27 PM       Radiology No results found.  Procedures Procedures (including critical care time)  Medications Ordered in ED Medications - No data to display   Initial Impression / Assessment and Plan / ED Course  I have reviewed the triage vital signs and the nursing notes.  Pertinent labs & imaging results that were available during my care of the patient were reviewed by me and considered in my medical decision making (see chart for details).     Patient is a 62 year old male presenting with seizure.  Patient has known seizure disorder.  Patient on Dilantin.  Every time previously for seizures his Dilantin has been undetectable.  Patient today states that he has been taking his medications although he does not member the name dosage or when the last time he took it.  Patient was noted also to have a low glucose here, patient was sitting down to eat when he had a seizure.  He has no history of diabetes.  Patient denies any alcohol use.  3:00 PM Patient had seizure witnessed by family.  Patient has history of seizure disorder.  Will order Dilantin level.  Will do Dilantin load of Dilantin load is undetectable.  Will look for source of infection such as urine or  chest x-ray and will get CT head given small abrasion on head.  Patient denies any preceding symptoms. Ai think this is likely secondary to medical non complaince.    Dilantin low, will load patient.     Final Clinical Impressions(s) / ED Diagnoses   Final diagnoses:  None    ED Discharge Orders    None       Abelino Derrick, MD 01/25/18 [phone]

## 2018-01-24 NOTE — ED Notes (Signed)
Pt verbalized understanding of d/c instructions and has no further questions. VSS, NAD.  

## 2018-01-24 NOTE — ED Notes (Signed)
CBG 54, provider notified.

## 2018-01-24 NOTE — ED Triage Notes (Signed)
Per [facility], pt from home after having a witness seizure by his roommate while sitting at the dinner table. Pt hit his head on the table upon seizure onset then fell to the floor. Seizure lasted 5 minutes. Pt was postictal with EMS but mental status improved in route. Pt is oriented to self upon arrival her, alert and following commands. Sinus tach with a RBBB on 12 lead.

## 2018-01-24 NOTE — ED Notes (Signed)
Dr Corlis LeakMackuen ordered to hold off on folic acid and thiamine, pt denies regular alcohol use stating "I only drink on occasion"

## 2018-01-24 NOTE — ED Notes (Signed)
Pt taken to CT.

## 2018-02-22 ENCOUNTER — Telehealth: Payer: Self-pay | Admitting: Family Medicine

## 2018-02-22 NOTE — Telephone Encounter (Signed)
2 page, paperwork received through fax 02-22-18. °

## 2018-10-14 ENCOUNTER — Emergency Department (HOSPITAL_COMMUNITY)
Admission: EM | Admit: 2018-10-14 | Discharge: 2018-10-14 | Disposition: A | Discharge status: Home / Self Care | Payer: BC Managed Care – PPO | Attending: Emergency Medicine | Admitting: Emergency Medicine

## 2018-10-14 ENCOUNTER — Encounter (HOSPITAL_COMMUNITY): Payer: Self-pay

## 2018-10-14 ENCOUNTER — Other Ambulatory Visit: Payer: Self-pay

## 2018-10-14 DIAGNOSIS — R569 Unspecified convulsions: Secondary | ICD-10-CM | POA: Diagnosis present

## 2018-10-14 DIAGNOSIS — G40909 Epilepsy, unspecified, not intractable, without status epilepticus: Secondary | ICD-10-CM

## 2018-10-14 DIAGNOSIS — Z9114 Patient's other noncompliance with medication regimen: Secondary | ICD-10-CM | POA: Diagnosis not present

## 2018-10-14 DIAGNOSIS — Z79899 Other long term (current) drug therapy: Secondary | ICD-10-CM | POA: Insufficient documentation

## 2018-10-14 DIAGNOSIS — Z91148 Patient's other noncompliance with medication regimen for other reason: Secondary | ICD-10-CM

## 2018-10-14 LAB — BASIC METABOLIC PANEL WITH GFR
Anion gap: 11 (ref 5–15)
BUN: 8 mg/dL (ref 8–23)
CO2: 23 mmol/L (ref 22–32)
Calcium: 9.1 mg/dL (ref 8.9–10.3)
Chloride: 100 mmol/L (ref 98–111)
Creatinine, Ser: 1.13 mg/dL (ref 0.61–1.24)
GFR calc Af Amer: >60 mL/min
GFR calc non Af Amer: >60 mL/min
Glucose, Bld: 89 mg/dL (ref 70–99)
Potassium: 3.8 mmol/L (ref 3.5–5.1)
Sodium: 134 mmol/L — ABNORMAL LOW (ref 135–145)

## 2018-10-14 LAB — CBC WITH DIFFERENTIAL/PLATELET
Basophils Absolute: 0.2 10*3/uL — ABNORMAL HIGH (ref 0.0–0.1)
Basophils Relative: 3 %
Eosinophils Absolute: 0 10*3/uL (ref 0.0–0.5)
Eosinophils Relative: 0 %
HCT: 34.3 % — ABNORMAL LOW (ref 39.0–52.0)
Hemoglobin: 9.9 g/dL — ABNORMAL LOW (ref 13.0–17.0)
Lymphocytes Relative: 9 %
Lymphs Abs: 0.5 10*3/uL — ABNORMAL LOW (ref 0.7–4.0)
MCH: 26.5 pg (ref 26.0–34.0)
MCHC: 28.9 g/dL — ABNORMAL LOW (ref 30.0–36.0)
MCV: 91.7 fL (ref 80.0–100.0)
Monocytes Absolute: 0.3 10*3/uL (ref 0.1–1.0)
Monocytes Relative: 5 %
Neutro Abs: 4.2 10*3/uL (ref 1.7–7.7)
Neutrophils Relative %: 83 %
Platelets: 49 10*3/uL — ABNORMAL LOW (ref 150–400)
RBC: 3.74 MIL/uL — ABNORMAL LOW (ref 4.22–5.81)
RDW: 15.1 % (ref 11.5–15.5)
WBC: 5 10*3/uL (ref 4.0–10.5)
nRBC: 0 % (ref 0.0–0.2)
nRBC: 0 /100 WBC

## 2018-10-14 LAB — PHENYTOIN LEVEL, TOTAL: Phenytoin Lvl: <2.5 ug/mL — ABNORMAL LOW (ref 10.0–20.0)

## 2018-10-14 MED ORDER — PHENYTOIN SODIUM EXTENDED 100 MG PO CAPS: 400.0000 mg | ORAL_CAPSULE | Freq: Every day | ORAL | 0 refills | Status: DC

## 2018-10-14 MED ORDER — LORAZEPAM 2 MG/ML IJ SOLN
1.0000 mg | Freq: Once | INTRAMUSCULAR | Status: AC
  Administered 2018-10-14: 1 mg via INTRAVENOUS
  Filled 2018-10-14: qty 1

## 2018-10-14 MED ORDER — PHENYTOIN SODIUM EXTENDED 100 MG PO CAPS
400.0000 mg | ORAL_CAPSULE | Freq: Once | ORAL | Status: AC
  Administered 2018-10-14: 400 mg via ORAL
  Filled 2018-10-14: qty 4

## 2018-10-14 NOTE — ED Provider Notes (Signed)
[facility] EMERGENCY DEPARTMENT Provider Note   CSN: [id number] Arrival date & time: 10/14/18  1606     History   Chief Complaint No chief complaint on file.   HPI Mason Hart is a 62 y.o. male.  HPI   62 year old male with reported seizure.  He has a history of the same.  Patient says he cannot really what happened but assumes he had a seizure.  He states that he was just coming back from the store was brought eat some popcorn when he began to feel weird.  He reportedly had generalized shaking became briefly unresponsive.  He was awake and alert on EMS arrival.  He did have vomit on his shirt. His glucose was in the 100s.  Patient has a past history of seizures and prescribed Dilantin.  He states that he has not taken it about 2 months.  Past history of alcohol abuse and he says his last drink was sometime last night.  Past Medical History:  Diagnosis Date  . Noncompliance with medication regimen   . Seizures [facility])     Patient Active Problem List   Diagnosis Date Noted  . Hypokalemia 06/24/2017  . Hypoglycemia 06/24/2017  . Alcohol use 06/24/2017  . Seizure (HCC) 06/24/2017  . Noncompliance with medication regimen   . Seizures (HCC)     No past surgical history on file.      Home Medications    Prior to Admission medications   Medication Sig Start Date End Date Taking? Authorizing Provider  phenytoin (DILANTIN) 100 MG ER capsule Take 4 capsules (400 mg total) by mouth at bedtime. 01/24/18   Jacalyn Lefevre, MD    Family History Family History  Problem Relation Age of Onset  . Heart disease Mother   . Cancer Neg Hx   . Diabetes Neg Hx   . Stroke Neg Hx     Social History Social History   Tobacco Use  . Smoking status: Never Smoker  . Smokeless tobacco: Never Used  Substance Use Topics  . Alcohol use: Yes    Alcohol/week: 7.0 standard drinks    Types: 7 Cans of beer per week    Comment: occassionally  . Drug use: No      Allergies   Patient has no known allergies.   Review of Systems Review of Systems  All systems reviewed and negative, other than as noted in HPI.  Physical Exam Updated Vital Signs There were no vitals taken for this visit.  Physical Exam  Constitutional: He is oriented to person, place, and time. He appears well-developed and well-nourished. No distress.  HENT:  Head: Normocephalic and atraumatic.  Eyes: Pupils are equal, round, and reactive to light. Conjunctivae and EOM are normal. Right eye exhibits no discharge. Left eye exhibits no discharge.  Neck: Neck supple.  No nuchal rigidity  Cardiovascular: Normal rate, regular rhythm and normal heart sounds. Exam reveals no gallop and no friction rub.  No murmur heard. Pulmonary/Chest: Effort normal and breath sounds normal. No respiratory distress.  Abdominal: Soft. He exhibits no distension. There is no tenderness.  Musculoskeletal: He exhibits no edema or tenderness.  Neurological: He is alert and oriented to person, place, and time. No cranial nerve deficit. He exhibits normal muscle tone. Coordination normal.  Skin: Skin is warm and dry.  Psychiatric: He has a normal mood and affect. His behavior is normal. Thought content normal.  Nursing note and vitals reviewed.    ED Treatments / Results  Labs (all labs ordered are listed, but only abnormal results are displayed) Labs Reviewed  PHENYTOIN LEVEL, TOTAL - Abnormal; Notable for the following components:      Result Value   Phenytoin Lvl <2.5 (*)    All other components within normal limits  BASIC METABOLIC PANEL - Abnormal; Notable for the following components:   Sodium 134 (*)    All other components within normal limits  CBC WITH DIFFERENTIAL/PLATELET - Abnormal; Notable for the following components:   RBC 3.74 (*)    Hemoglobin 9.9 (*)    HCT 34.3 (*)    MCHC 28.9 (*)    Platelets 49 (*)    Lymphs Abs 0.5 (*)    Basophils Absolute 0.2 (*)    All other  components within normal limits    EKG None  Radiology No results found.  Procedures Procedures (including critical care time)  Medications Ordered in ED Medications  LORazepam (ATIVAN) injection 1 mg (has no administration in time range)     Initial Impression / Assessment and Plan / ED Course  I have reviewed the triage vital signs and the nursing notes.  Pertinent labs & imaging results that were available during my care of the patient were reviewed by me and considered in my medical decision making (see chart for details).     62 year old male with reported seizure.  History of the same.  Noncompliant with his Dilantin.  Will load orally.  He is now awake and acting appropriately.  His neuro exam is nonfocal.  Is afebrile.  History of call abuse with last drink last night.  Clinically does not appear to be overtly withdrawing.  We will give him some Ativan and continue to observe.  No further complaints. Provided with prescription and discuss the importance of compliance.   Final Clinical Impressions(s) / ED Diagnoses   Final diagnoses:  Seizure [facility](HCC)  Noncompliance with medication regimen    ED Discharge Orders    None       Raeford RazorKohut, Keylin Ferryman, MD 10/18/18 [phone]

## 2018-10-14 NOTE — ED Triage Notes (Signed)
Pt arrives to ED from witnessed seizure around 1515 this afternoon that lasted for approx 1 min, did have episode of vomiting. EMS reports pt has hx of seizures and has been out of his meds for 2 months. Pt has been A&Ox4 for EMS the whole time. Pt placed in position of comfort with bed locked and lowered, call bell in reach.

## 2018-10-14 NOTE — ED Notes (Signed)
Patient verbalizes understanding of discharge instructions. Opportunity for questioning and answers were provided. Armband removed by staff, pt discharged from ED ambulatory.   

## 2018-10-17 NOTE — ED Notes (Signed)
 1mg  ativan wasted in sharps container, witnessed by Florentina AddisonKatie, Charity fundraiserN.

## 2018-11-13 ENCOUNTER — Emergency Department (HOSPITAL_COMMUNITY): Payer: BC Managed Care – PPO

## 2018-11-13 ENCOUNTER — Observation Stay (HOSPITAL_COMMUNITY)
Admission: EM | Admit: 2018-11-13 | Discharge: 2018-11-19 | Disposition: A | Discharge status: Home / Self Care | Payer: BC Managed Care – PPO | Attending: Internal Medicine | Admitting: Internal Medicine

## 2018-11-13 ENCOUNTER — Other Ambulatory Visit: Payer: Self-pay

## 2018-11-13 ENCOUNTER — Encounter (HOSPITAL_COMMUNITY): Payer: Self-pay | Admitting: Emergency Medicine

## 2018-11-13 DIAGNOSIS — R4182 Altered mental status, unspecified: Secondary | ICD-10-CM

## 2018-11-13 DIAGNOSIS — G40919 Epilepsy, unspecified, intractable, without status epilepticus: Secondary | ICD-10-CM | POA: Diagnosis not present

## 2018-11-13 DIAGNOSIS — R569 Unspecified convulsions: Secondary | ICD-10-CM | POA: Diagnosis present

## 2018-11-13 DIAGNOSIS — M79601 Pain in right arm: Secondary | ICD-10-CM | POA: Insufficient documentation

## 2018-11-13 DIAGNOSIS — Z79899 Other long term (current) drug therapy: Secondary | ICD-10-CM | POA: Diagnosis not present

## 2018-11-13 DIAGNOSIS — F1010 Alcohol abuse, uncomplicated: Secondary | ICD-10-CM | POA: Diagnosis not present

## 2018-11-13 DIAGNOSIS — R269 Unspecified abnormalities of gait and mobility: Secondary | ICD-10-CM | POA: Diagnosis not present

## 2018-11-13 DIAGNOSIS — R531 Weakness: Secondary | ICD-10-CM | POA: Diagnosis not present

## 2018-11-13 DIAGNOSIS — Z789 Other specified health status: Secondary | ICD-10-CM

## 2018-11-13 DIAGNOSIS — M47812 Spondylosis without myelopathy or radiculopathy, cervical region: Secondary | ICD-10-CM | POA: Insufficient documentation

## 2018-11-13 DIAGNOSIS — W19XXXA Unspecified fall, initial encounter: Secondary | ICD-10-CM | POA: Diagnosis not present

## 2018-11-13 DIAGNOSIS — N179 Acute kidney failure, unspecified: Secondary | ICD-10-CM | POA: Diagnosis present

## 2018-11-13 DIAGNOSIS — R52 Pain, unspecified: Secondary | ICD-10-CM

## 2018-11-13 DIAGNOSIS — E162 Hypoglycemia, unspecified: Secondary | ICD-10-CM | POA: Diagnosis not present

## 2018-11-13 DIAGNOSIS — S42002A Fracture of unspecified part of left clavicle, initial encounter for closed fracture: Secondary | ICD-10-CM

## 2018-11-13 DIAGNOSIS — S42022A Displaced fracture of shaft of left clavicle, initial encounter for closed fracture: Secondary | ICD-10-CM | POA: Insufficient documentation

## 2018-11-13 DIAGNOSIS — Z7289 Other problems related to lifestyle: Secondary | ICD-10-CM

## 2018-11-13 DIAGNOSIS — S42009A Fracture of unspecified part of unspecified clavicle, initial encounter for closed fracture: Secondary | ICD-10-CM | POA: Diagnosis present

## 2018-11-13 LAB — CBC WITH DIFFERENTIAL/PLATELET
Band Neutrophils: 0 %
Basophils Absolute: 0 10*3/uL (ref 0.0–0.1)
Basophils Relative: 0 %
Blasts: 0 %
Eosinophils Absolute: 0.2 10*3/uL (ref 0.0–0.5)
Eosinophils Relative: 2 %
HEMATOCRIT: 34.7 % — AB (ref 39.0–52.0)
HEMOGLOBIN: 10.5 g/dL — AB (ref 13.0–17.0)
Lymphocytes Relative: 9 %
Lymphs Abs: 0.7 10*3/uL (ref 0.7–4.0)
MCH: 26.8 pg (ref 26.0–34.0)
MCHC: 30.3 g/dL (ref 30.0–36.0)
MCV: 88.5 fL (ref 80.0–100.0)
MYELOCYTES: 0 %
Metamyelocytes Relative: 0 %
Monocytes Absolute: 0.4 10*3/uL (ref 0.1–1.0)
Monocytes Relative: 5 %
NRBC: 0 % (ref 0.0–0.2)
Neutro Abs: 6.8 10*3/uL (ref 1.7–7.7)
Neutrophils Relative %: 84 %
Other: 0 %
Platelets: 281 10*3/uL (ref 150–400)
Promyelocytes Relative: 0 %
RBC: 3.92 MIL/uL — ABNORMAL LOW (ref 4.22–5.81)
RDW: 14.7 % (ref 11.5–15.5)
WBC: 8.1 10*3/uL (ref 4.0–10.5)
nRBC: 0 /100 WBC

## 2018-11-13 LAB — URINALYSIS, COMPLETE (UACMP) WITH MICROSCOPIC
GLUCOSE, UA: NEGATIVE mg/dL
Ketones, ur: 5 mg/dL — AB
LEUKOCYTES UA: NEGATIVE
Nitrite: NEGATIVE
Protein, ur: 100 mg/dL — AB
Specific Gravity, Urine: 1.024 (ref 1.005–1.030)
pH: 5 (ref 5.0–8.0)

## 2018-11-13 LAB — PHENYTOIN LEVEL, TOTAL: Phenytoin Lvl: 4.8 ug/mL — ABNORMAL LOW (ref 10.0–20.0)

## 2018-11-13 LAB — RAPID URINE DRUG SCREEN, HOSP PERFORMED
AMPHETAMINES: NOT DETECTED
Barbiturates: POSITIVE — AB
Benzodiazepines: NOT DETECTED
Cocaine: NOT DETECTED
Opiates: NOT DETECTED
TETRAHYDROCANNABINOL: NOT DETECTED

## 2018-11-13 LAB — COMPREHENSIVE METABOLIC PANEL
ALT: 22 U/L (ref 0–44)
AST: 40 U/L (ref 15–41)
Albumin: 3.5 g/dL (ref 3.5–5.0)
Alkaline Phosphatase: 81 U/L (ref 38–126)
Anion gap: 13 (ref 5–15)
BUN: 17 mg/dL (ref 8–23)
CO2: 20 mmol/L — ABNORMAL LOW (ref 22–32)
Calcium: 9.7 mg/dL (ref 8.9–10.3)
Chloride: 101 mmol/L (ref 98–111)
Creatinine, Ser: 1.76 mg/dL — ABNORMAL HIGH (ref 0.61–1.24)
GFR calc Af Amer: 46 mL/min — ABNORMAL LOW (ref >60)
GFR calc non Af Amer: 40 mL/min — ABNORMAL LOW (ref >60)
Glucose, Bld: 107 mg/dL — ABNORMAL HIGH (ref 70–99)
Potassium: 4.2 mmol/L (ref 3.5–5.1)
Sodium: 134 mmol/L — ABNORMAL LOW (ref 135–145)
Total Bilirubin: 0.4 mg/dL (ref 0.3–1.2)
Total Protein: 8 g/dL (ref 6.5–8.1)

## 2018-11-13 LAB — I-STAT CG4 LACTIC ACID, ED
Lactic Acid, Venous: 4.88 mmol/L (ref 0.5–1.9)
Lactic Acid, Venous: 3.54 mmol/L (ref 0.5–1.9)

## 2018-11-13 LAB — AMMONIA: Ammonia: 24 umol/L (ref 9–35)

## 2018-11-13 LAB — ETHANOL: Alcohol, Ethyl (B): 13 mg/dL — ABNORMAL HIGH (ref <10)

## 2018-11-13 LAB — CBG MONITORING, ED: Glucose-Capillary: 87 mg/dL (ref 70–99)

## 2018-11-13 MED ORDER — SODIUM CHLORIDE 0.9 % IV BOLUS (SEPSIS)
1000.0000 mL | Freq: Once | INTRAVENOUS | Status: AC
  Administered 2018-11-14: 1000 mL via INTRAVENOUS

## 2018-11-13 MED ORDER — SODIUM CHLORIDE 0.9 % IV BOLUS
1000.0000 mL | Freq: Once | INTRAVENOUS | Status: AC
  Administered 2018-11-13: 1000 mL via INTRAVENOUS

## 2018-11-13 MED ORDER — LEVETIRACETAM IN NACL 1000 MG/100ML IV SOLN
1000.0000 mg | Freq: Once | INTRAVENOUS | Status: AC
  Administered 2018-11-13: 1000 mg via INTRAVENOUS
  Filled 2018-11-13: qty 100

## 2018-11-13 NOTE — ED Notes (Signed)
Patient transported to CT scan . 

## 2018-11-13 NOTE — ED Notes (Signed)
Dr. Marshia LyArroor ( neurologist) at bedside evaluating pt.

## 2018-11-13 NOTE — ED Provider Notes (Addendum)
[facility] Note   CSN: [id number] Arrival date & time: 11/13/18  2124     History   Chief Complaint Chief Complaint  Patient presents with  . Altered Mental Status    HPI Mason Hart is a 62 y.o. male.  The history is provided by the patient, the EMS personnel and medical records.  Altered Mental Status   This is a recurrent problem. Episode onset: unknown. The problem has been gradually improving. Associated symptoms include confusion, seizures (possible per EMS) and weakness (left sided). Pertinent negatives include no hallucinations. Risk factors include the patient not taking medications correctly (hx of noncompliance). His past medical history is significant for seizures.    Past Medical History:  Diagnosis Date  . Noncompliance with medication regimen   . Seizures [facility](HCC)     Patient Active Problem List   Diagnosis Date Noted  . Hypokalemia 06/24/2017  . Hypoglycemia 06/24/2017  . Alcohol use 06/24/2017  . Seizure (HCC) 06/24/2017  . Noncompliance with medication regimen   . Seizures (HCC)     History reviewed. No pertinent surgical history.      Home Medications    Prior to Admission medications   Medication Sig Start Date End Date Taking? Authorizing Provider  phenytoin (DILANTIN) 100 MG ER capsule Take 4 capsules (400 mg total) by mouth at bedtime. 10/14/18   Raeford RazorKohut, Stephen, MD    Family History Family History  Problem Relation Age of Onset  . Heart disease Mother   . Cancer Neg Hx   . Diabetes Neg Hx   . Stroke Neg Hx     Social History Social History   Tobacco Use  . Smoking status: Never Smoker  . Smokeless tobacco: Never Used  Substance Use Topics  . Alcohol use: Yes    Alcohol/week: 7.0 standard drinks    Types: 7 Cans of beer per week    Comment: occassionally  . Drug use: No     Allergies   Patient has no known allergies.   Review of Systems Review of Systems    Constitutional: Negative for chills and fever.  HENT: Negative for ear pain and sore throat.   Eyes: Negative for pain and visual disturbance.  Respiratory: Negative for cough and shortness of breath.   Cardiovascular: Negative for chest pain and palpitations.  Gastrointestinal: Negative for abdominal pain and vomiting.  Genitourinary: Negative for dysuria and hematuria.  Musculoskeletal: Negative for arthralgias and back pain.  Skin: Negative for color change and rash.  Neurological: Positive for seizures (possible per EMS) and weakness (left sided). Negative for dizziness, tremors, syncope, facial asymmetry, speech difficulty, light-headedness, numbness and headaches.  Psychiatric/Behavioral: Positive for confusion. Negative for hallucinations.  All other systems reviewed and are negative.    Physical Exam Updated Vital Signs  ED Triage Vitals  Enc Vitals Group     BP 11/13/18 2131 (!) 104/50     Pulse Rate 11/13/18 2131 92     Resp 11/13/18 2131 20     Temp 11/13/18 2131 98.6 F (37 C)     Temp Source 11/13/18 2215 Rectal     SpO2 11/13/18 2130 98 %     Weight 11/13/18 2132 212 lb 1.3 oz (96.2 kg)     Height 11/13/18 2132 6\' 1"  (1.854 m)     Head Circumference --      Peak Flow --      Pain Score 11/13/18 2132 0     Pain Loc --  Pain Edu? --      Excl. in GC? --     Physical Exam Vitals signs and nursing note reviewed.  Constitutional:      Appearance: He is well-developed.  HENT:     Head: Normocephalic and atraumatic.     Nose: Nose normal.     Mouth/Throat:     Mouth: Mucous membranes are moist.  Eyes:     Extraocular Movements: Extraocular movements intact.     Conjunctiva/sclera: Conjunctivae normal.     Pupils: Pupils are equal, round, and reactive to light.  Neck:     Musculoskeletal: Normal range of motion and neck supple. No neck rigidity.  Cardiovascular:     Rate and Rhythm: Normal rate and regular rhythm.     Pulses: Normal pulses.     Heart  sounds: Normal heart sounds. No murmur.  Pulmonary:     Effort: Pulmonary effort is normal. No respiratory distress.     Breath sounds: Normal breath sounds.  Abdominal:     Palpations: Abdomen is soft.     Tenderness: There is no abdominal tenderness.  Musculoskeletal: Normal range of motion.     Right lower leg: No edema.     Left lower leg: No edema.  Skin:    General: Skin is warm and dry.     Capillary Refill: Capillary refill takes less than 2 seconds.  Neurological:     Mental Status: He is alert.     Cranial Nerves: No cranial nerve deficit.     Comments: 5+ out of 5 strength in right upper extremity and right lower extremity, 4+ out of 5 strength in left upper extremity left lower extremity, drift to left upper extremity, normal sensation throughout, patient is overall alert, knows his name, knows where he is but appears to have some confusion  Psychiatric:        Mood and Affect: Mood normal.      ED Treatments / Results  Labs (all labs ordered are listed, but only abnormal results are displayed) Labs Reviewed  COMPREHENSIVE METABOLIC PANEL - Abnormal; Notable for the following components:      Result Value   Sodium 134 (*)    CO2 20 (*)    Glucose, Bld 107 (*)    Creatinine, Ser 1.76 (*)    GFR calc non Af Amer 40 (*)    GFR calc Af Amer 46 (*)    All other components within normal limits  CBC WITH DIFFERENTIAL/PLATELET - Abnormal; Notable for the following components:   RBC 3.92 (*)    Hemoglobin 10.5 (*)    HCT 34.7 (*)    All other components within normal limits  URINALYSIS, COMPLETE (UACMP) WITH MICROSCOPIC - Abnormal; Notable for the following components:   Color, Urine AMBER (*)    APPearance TURBID (*)    Hgb urine dipstick MODERATE (*)    Bilirubin Urine SMALL (*)    Ketones, ur 5 (*)    Protein, ur 100 (*)    Bacteria, UA RARE (*)    Non Squamous Epithelial 0-5 (*)    All other components within normal limits  PHENYTOIN LEVEL, TOTAL - Abnormal;  Notable for the following components:   Phenytoin Lvl 4.8 (*)    All other components within normal limits  RAPID URINE DRUG SCREEN, HOSP PERFORMED - Abnormal; Notable for the following components:   Barbiturates POSITIVE (*)    All other components within normal limits  ETHANOL - Abnormal; Notable for the  following components:   Alcohol, Ethyl (B) 13 (*)    All other components within normal limits  I-STAT CG4 LACTIC ACID, ED - Abnormal; Notable for the following components:   Lactic Acid, Venous 4.88 (*)    All other components within normal limits  I-STAT CG4 LACTIC ACID, ED - Abnormal; Notable for the following components:   Lactic Acid, Venous 3.54 (*)    All other components within normal limits  CULTURE, BLOOD (ROUTINE X 2)  CULTURE, BLOOD (ROUTINE X 2)  AMMONIA  CBG MONITORING, ED    EKG None  Radiology Ct Head Wo Contrast  Result Date: 11/13/2018 CLINICAL DATA:  History of seizures with altered level of consciousness. EXAM: CT HEAD WITHOUT CONTRAST CT CERVICAL SPINE WITHOUT CONTRAST TECHNIQUE: Multidetector CT imaging of the head and cervical spine was performed following the standard protocol without intravenous contrast. Multiplanar CT image reconstructions of the cervical spine were also generated. COMPARISON:  01/24/2018 FINDINGS: CT HEAD FINDINGS Brain: Chronic stable age related involutional changes of the brain. Chronic mild-to-moderate small vessel ischemia the periventricular and subcortical white matter. No large vascular territory infarct, hemorrhage, intra-axial mass nor extra-axial fluid. No effacement of the basal cisterns. The brainstem and cerebellum appear nonacute. Vascular: No hyperdense vessel sign. Skull: No acute calvarial fracture or significant calvarial soft tissue swelling. Sinuses/Orbits: Left maxillary sinus wall thickening with inspissated mucus and evidence left ostiomeatal unit obstruction causing opacification of the left frontal sinus, ethmoid and  included maxillary sinuses. Findings are in keeping with chronic sinus disease. Intact orbits and globes. Other: None CT CERVICAL SPINE FINDINGS Alignment: Minimal anterolisthesis of C5 on C6. Skull base and vertebrae: Intact skull base. No acute cervical spine fracture. No suspicious osseous lesions. Bulky osteophytes are noted off the anterior aspect of the cervical spine at all levels. This is between C3 and C5. Soft tissues and spinal canal: No prevertebral soft tissue swelling. No visible canal hematoma. Disc levels: Moderate to marked disc flattening at all levels of the cervical spine. Mild osseous central canal stenosis at C6-7. Uncovertebral joint osteoarthritis on the right at C2-3, bilaterally at C4-5, C5-6 and C6-7. These contribute to moderate encroachment greatest at C5-6 on the right. Upper chest: Negative. Other: None IMPRESSION: 1. Chronic mild-to-moderate small vessel ischemic disease of periventricular and subcortical white matter. No acute intracranial abnormality. 2. Chronic paranasal sinusitis. 3. Cervical spondylosis without acute cervical spine fracture. Electronically Signed   By: Tollie Eth M.D.   On: 11/13/2018 22:50   Ct Cervical Spine Wo Contrast  Result Date: 11/13/2018 CLINICAL DATA:  History of seizures with altered level of consciousness. EXAM: CT HEAD WITHOUT CONTRAST CT CERVICAL SPINE WITHOUT CONTRAST TECHNIQUE: Multidetector CT imaging of the head and cervical spine was performed following the standard protocol without intravenous contrast. Multiplanar CT image reconstructions of the cervical spine were also generated. COMPARISON:  01/24/2018 FINDINGS: CT HEAD FINDINGS Brain: Chronic stable age related involutional changes of the brain. Chronic mild-to-moderate small vessel ischemia the periventricular and subcortical white matter. No large vascular territory infarct, hemorrhage, intra-axial mass nor extra-axial fluid. No effacement of the basal cisterns. The brainstem and  cerebellum appear nonacute. Vascular: No hyperdense vessel sign. Skull: No acute calvarial fracture or significant calvarial soft tissue swelling. Sinuses/Orbits: Left maxillary sinus wall thickening with inspissated mucus and evidence left ostiomeatal unit obstruction causing opacification of the left frontal sinus, ethmoid and included maxillary sinuses. Findings are in keeping with chronic sinus disease. Intact orbits and globes. Other: None CT CERVICAL SPINE  FINDINGS Alignment: Minimal anterolisthesis of C5 on C6. Skull base and vertebrae: Intact skull base. No acute cervical spine fracture. No suspicious osseous lesions. Bulky osteophytes are noted off the anterior aspect of the cervical spine at all levels. This is between C3 and C5. Soft tissues and spinal canal: No prevertebral soft tissue swelling. No visible canal hematoma. Disc levels: Moderate to marked disc flattening at all levels of the cervical spine. Mild osseous central canal stenosis at C6-7. Uncovertebral joint osteoarthritis on the right at C2-3, bilaterally at C4-5, C5-6 and C6-7. These contribute to moderate encroachment greatest at C5-6 on the right. Upper chest: Negative. Other: None IMPRESSION: 1. Chronic mild-to-moderate small vessel ischemic disease of periventricular and subcortical white matter. No acute intracranial abnormality. 2. Chronic paranasal sinusitis. 3. Cervical spondylosis without acute cervical spine fracture. Electronically Signed   By: Tollie Eth M.D.   On: 11/13/2018 22:50   Dg Chest Port 1 View  Result Date: 11/13/2018 CLINICAL DATA:  Altered mental status EXAM: PORTABLE CHEST 1 VIEW COMPARISON:  01/24/2018 FINDINGS: Shallow inspiration. Heart size and pulmonary vascularity are normal. Lungs appear clear and expanded. No blunting of costophrenic angles. No pneumothorax. Mediastinal contours appear intact. Degenerative changes in the thoracic spine and shoulders. Displaced fracture of the midshaft left clavicle,  new since previous study. IMPRESSION: No evidence of active pulmonary disease. Displaced fracture of the midshaft left clavicle. Electronically Signed   By: Burman Nieves M.D.   On: 11/13/2018 22:38    Procedures .Critical Care Performed by: Virgina Norfolk, DO Authorized by: Virgina Norfolk, DO   Critical care provider statement:    Critical care time (minutes):  40   Critical care was necessary to treat or prevent imminent or life-threatening deterioration of the following conditions:  CNS failure or compromise   Critical care was time spent personally by me on the following activities:  Development of treatment plan with patient or surrogate, discussions with consultants, evaluation of patient's response to treatment, examination of patient, obtaining history from patient or surrogate, ordering and performing treatments and interventions, ordering and review of laboratory studies, ordering and review of radiographic studies, pulse oximetry, re-evaluation of patient's condition and review of old charts   I assumed direction of critical care for this patient from another provider in my specialty: no     (including critical care time)  Medications Ordered in ED Medications  sodium chloride 0.9 % bolus 1,000 mL (1,000 mLs Intravenous New Bag/Given 11/13/18 2313)  sodium chloride 0.9 % bolus 1,000 mL (1,000 mLs Intravenous New Bag/Given 11/14/18 0001)  levETIRAcetam (KEPPRA) IVPB 1000 mg/100 mL premix (0 mg Intravenous Stopped 11/14/18 0001)     Initial Impression / Assessment and Plan / ED Course  I have reviewed the triage vital signs and the nursing notes.  Pertinent labs & imaging results that were available during my care of the patient were reviewed by me and considered in my medical decision making (see chart for details).     Mason Hart is a 62 year old male with history of seizures, noncompliance to medication who presents to the ED with altered mental status with EMS.   Patient arrived with normal vitals.  Patient mildly hypothermic.  Blood sugar with EMS was 60.  Improved to 87 here upon arrival.  Patient is not a diabetic.  Patient according to EMS who provides history was called out for possible seizures.  However they did not get good history from family.  I am unable to reach family by the  phone.  Patient appears to have left-sided weakness but unknown when his last normal was.  Possible Todd's paralysis.  Patient appears to be still mildly post ictal.  He overall appears neurologically intact despite some left-sided weakness with left upper extremity drift.  Has a remote history of alcohol abuse upon chart review.  Multiple alcohol levels recently have been within normal limits.  Given lack a history and patient with some neuro deficits will evaluate with lab work including head CT, neck CT, chest x-ray, urine studies, labs.  Will get Dilantin level as patient has prescription for Dilantin.  Will give fluid bolus.  Patient with lactic acid of 4.88.  Likely from seizure activity but will expand work-up to include blood cultures.  Dilantin level below normal limit.  Head CT shows no acute findings.  Chest x-ray shows displaced left clavicle fracture, patient does not have pain to palpation, unaware if fall.  Neck CT unremarkable.  Suspect patient with left upper extremity issues due to clavicle fracture.  Unknown when this occurred.  Will place in a sling.  Patient will need orthopedic follow-up.  Patient neurovascularly intact on exam.  Patient with improvement of strength in the lower extremities now.  Likely resolving seizure/postictal state.  Dr. Jerrell Belfast with neurology was consulted and came down to the ED to evaluate the patient.  He thinks most likely patient had seizure and having prolonged postictal state.  Overall neuro exam is improving.  He was able to talk with family who is able to confirm that he likely had a seizure.  Dr. Wilford Corner recommends switching over to  Keppra.  Patient loaded with 1 g IV Keppra.  No signs of urinary tract infection.  Has mild elevation in creatinine likely from dehydration.  Alcohol within normal limits.  Less likely alcohol withdrawal as family member states that patient does not drink much anymore.  Patient to be observed in the ED and given IV hydration. No concern for stroke. Will repeat lactic acid which is already coming down prior to any fluids being given.  Likely secondary from seizure-like activity.  Patient handed off to oncoming ED staff.  If patient is to be discharged he needs to be started on Keppra 500 mg twice daily and follow-up with orthopedic for clavicle fracture.  Patient was hemodynamically stable throughout my care.  This chart was dictated using voice recognition software.  Despite best efforts to proofread,  errors can occur which can change the documentation meaning.   Final Clinical Impressions(s) / ED Diagnoses   Final diagnoses:  Altered mental status, unspecified altered mental status type  Closed displaced fracture of left clavicle, unspecified part of clavicle, initial encounter    ED Discharge Orders    None       Virgina Norfolk, DO 11/14/18 0014    Virgina Norfolk, DO 11/14/18 [id number]

## 2018-11-13 NOTE — ED Triage Notes (Signed)
Pt BIB [facility] for "shaking". Pt has a history of seizures. When first unit arrived patients CBG was 60. Was given 15g oral glucose and food/drink/ Last cbg 120. Pt had some slurred speech and left arm weakness. No LVO per EMS. Last seen normal 1130 12-18. Family states not his baseline

## 2018-11-13 NOTE — Consult Note (Signed)
Neurology Consultation  Reason for Consult: seizure, left sided weaknewss Referring Physician: Dr. Lockie Mola  CC: Seizure  History is obtained from: Chart.  I also called wife Ms. Gurkaran Rahm at [phone] but she was not able to provide me with a lot of information-unsure if the line was not clear or there was some comprehension barrier.  HPI: Mason Hart is a 62 y.o. male past medical history of seizures with noncompliance to medications, presented to the emergency room via EMS for "shaking".  EMS was called to evaluate the patient was having seizures.  His CBG at that time was 60.  He was given 15 g of oral glucose and some food and drink with repeat CBG being 120.  He had some slurred speech and left arm weakness.  No evidence of LVO.  Code stroke not activated. I was personally told that he has a history of alcohol abuse but his wife on the phone said that he does not drink alcohol.  The history from wife was limited-I am not clear whether it was an unclear phone line or there was some communication barrier. Patient does not have any obvious shaking movements right now. His chest x-ray reveals a displaced midshaft left clavicular fracture. Neurological consultation was placed for seizures, management of antiepileptics.   LKW: Unable to ascertain tpa given?: no, unable to ascertain last known well Premorbid modified Rankin scale (mRS): *Unable to ascertain  ROS:ROS was performed and is negative except as noted in the HPI.    Past Medical History:  Diagnosis Date  . Noncompliance with medication regimen   . Seizures (HCC)     Family History  Problem Relation Age of Onset  . Heart disease Mother   . Cancer Neg Hx   . Diabetes Neg Hx   . Stroke Neg Hx     Social History:   reports that he has never smoked. He has never used smokeless tobacco. He reports current alcohol use of about 7.0 standard drinks of alcohol per week. He reports that he does not use  drugs.  Medications  Current Facility-Administered Medications:  .  sodium chloride 0.9 % bolus 1,000 mL, 1,000 mL, Intravenous, Once, Curatolo, Adam, DO, Last Rate: 983.6 mL/hr at 11/13/18 2313, 1,000 mL at 11/13/18 2313  Current Outpatient Medications:  .  phenytoin (DILANTIN) 100 MG ER capsule, Take 4 capsules (400 mg total) by mouth at bedtime., Disp: 120 capsule, Rfl: 0   Exam: Current vital signs: BP (!) 104/50   Pulse 92   Temp (!) 96.6 F (35.9 C) (Rectal)   Resp 20   Ht 6\' 1"  (1.854 m)   Wt 96.2 kg   SpO2 99%   BMI 27.98 kg/m  Vital signs in last 24 hours: Temp:  [96.6 F (35.9 C)-98.6 F (37 C)] 96.6 F (35.9 C) (12/18 2215) Pulse Rate:  [92] 92 (12/18 2131) Resp:  [20] 20 (12/18 2131) BP: (104)/(50) 104/50 (12/18 2131) SpO2:  [98 %-99 %] 99 % (12/18 2131) Weight:  [96.2 kg] 96.2 kg (12/18 2132) GENERAL: Awake, alert in NAD HEENT: - Normocephalic and atraumatic, dry mm, no LN++, no Thyromegally LUNGS - Clear to auscultation bilaterally with no wheezes CV - S1S2 RRR, no m/r/g, equal pulses bilaterally. ABDOMEN - Soft, nontender, nondistended with normoactive BS Ext: Swollen left arm, otherwise warm well perfused  NEURO:  Mental Status: Awake, alert, able to tell me he is at [facility].  Could not tell me the month.  Could not tell me  the date.  Could not tell me why he is here. Language: speech is moderately dysarthric.poor attention concentration.  Follows simple commands.  Does not follow multistep commands. Cranial Nerves: PERRL. EOMI, visual fields full, no facial asymmetry, hearing intact, tongue/uvula/soft palate midline, tongue midline Motor: Right upper extremity antigravity with no drift with some fine tremor.  Both lower extremities antigravity with no focal weakness.  Left upper extremity-does not move probably due to pain and a clavicular fracture.  Left arm and hand are also swollen. Tone: is normal and bulk is normal Sensation- Intact to  light touch bilaterally Coordination: Did not cooperate to perform Gait- deferred  NIHSS 1a Level of Conscious.: 0 1b LOC Questions: 2 1c LOC Commands: 0 2 Best Gaze: 0 3 Visual: 0 4 Facial Palsy: 0 5a Motor Arm - left: 3 5b Motor Arm - Right: 0 6a Motor Leg - Left: 0 6b Motor Leg - Right: 0 7 Limb Ataxia: 0 8 Sensory: 0 9 Best Language: 1 10 Dysarthria: 2 11 Extinct. and Inatten.: 0 TOTAL: 8  Labs I have reviewed labs in epic and the results pertinent to this consultation are:  CBC    Component Value Date/Time   WBC 8.1 11/13/2018 2206   RBC 3.92 (L) 11/13/2018 2206   HGB 10.5 (L) 11/13/2018 2206   HCT 34.7 (L) 11/13/2018 2206   PLT 281 11/13/2018 2206   MCV 88.5 11/13/2018 2206   MCH 26.8 11/13/2018 2206   MCHC 30.3 11/13/2018 2206   RDW 14.7 11/13/2018 2206   LYMPHSABS 0.7 11/13/2018 2206   MONOABS 0.4 11/13/2018 2206   EOSABS 0.2 11/13/2018 2206   BASOSABS 0.0 11/13/2018 2206    CMP     Component Value Date/Time   NA 134 (L) 11/13/2018 2206   NA 135 08/09/2017 1103   K 4.2 11/13/2018 2206   CL 101 11/13/2018 2206   CO2 20 (L) 11/13/2018 2206   GLUCOSE 107 (H) 11/13/2018 2206   BUN 17 11/13/2018 2206   BUN 7 (L) 08/09/2017 1103   CREATININE 1.76 (H) 11/13/2018 2206   CALCIUM 9.7 11/13/2018 2206   PROT 8.0 11/13/2018 2206   PROT 7.5 08/09/2017 1103   ALBUMIN 3.5 11/13/2018 2206   ALBUMIN 3.5 (L) 08/09/2017 1103   AST 40 11/13/2018 2206   ALT 22 11/13/2018 2206   ALKPHOS 81 11/13/2018 2206   BILITOT 0.4 11/13/2018 2206   BILITOT 0.4 08/09/2017 1103   GFRNONAA 40 (L) 11/13/2018 2206   GFRAA 46 (L) 11/13/2018 2206   Lactate is elevated at 4.88. Phenytoin level 4.8 Phenytoin levels in the past have been undetectable.  Imaging I have reviewed the images obtained:  CT-scan of the brain-no acute changes  Assessment:  62 year old man with a history of seizures and noncompliance to Dilantin, brought in by EMS presumably for some shaking activity  at home. At the time he was hypoglycemic.  He was also hypothermic on arrival.  Lactate was elevated at 4.88. Likely seizure with prolonged postictal period. Of note, could not move the left arm but later on in the chest x-ray was shown to have a midclavicular fracture on the left-likely the reason for inability to move his left arm. I would load him with Keppra 1 g IV for now and continue with Keppra 500 twice daily instead of Dilantin He might need admission if he does not return back to baseline in the next several hours. Labs also reveal AKI  Impression: Breakthrough seizure Likely provoked by hypoglycemia AKI  Recommendations: Correction of toxic metabolic derangements per primary team-ER Loaded with Keppra 1 g IV now and followed with 500 twice daily p.o. going forward. Seizure precautions including no driving for 6 months. Continue neurochecks in the ER If improves in the next several hours, can be discharged home with outpatient follow-up.  If does not come back to normal mentation, would require further work-up including EEG and MRI. Attempted to reach family but difficult to communicate with the wife over the phone. Awaiting any other family arrival in the ER.  Please call with questions.  -- Milon DikesAshish Fredy Gladu, MD [facility] Pager: [phone] If 7pm to 7am, please call on call as listed on AMION.

## 2018-11-14 ENCOUNTER — Observation Stay (HOSPITAL_COMMUNITY): Payer: BC Managed Care – PPO

## 2018-11-14 ENCOUNTER — Encounter (HOSPITAL_COMMUNITY): Payer: Self-pay | Admitting: Internal Medicine

## 2018-11-14 DIAGNOSIS — R569 Unspecified convulsions: Secondary | ICD-10-CM

## 2018-11-14 DIAGNOSIS — N179 Acute kidney failure, unspecified: Secondary | ICD-10-CM | POA: Diagnosis present

## 2018-11-14 DIAGNOSIS — Z7289 Other problems related to lifestyle: Secondary | ICD-10-CM

## 2018-11-14 DIAGNOSIS — S42009A Fracture of unspecified part of unspecified clavicle, initial encounter for closed fracture: Secondary | ICD-10-CM | POA: Diagnosis present

## 2018-11-14 DIAGNOSIS — E162 Hypoglycemia, unspecified: Secondary | ICD-10-CM | POA: Diagnosis not present

## 2018-11-14 LAB — GLUCOSE, CAPILLARY
Glucose-Capillary: 126 mg/dL — ABNORMAL HIGH (ref 70–99)
Glucose-Capillary: 122 mg/dL — ABNORMAL HIGH (ref 70–99)

## 2018-11-14 LAB — HEPATIC FUNCTION PANEL
ALT: 22 U/L (ref 0–44)
AST: 53 U/L — ABNORMAL HIGH (ref 15–41)
Albumin: 3.1 g/dL — ABNORMAL LOW (ref 3.5–5.0)
Alkaline Phosphatase: 68 U/L (ref 38–126)
Bilirubin, Direct: <0.1 mg/dL (ref 0.0–0.2)
Total Bilirubin: 0.6 mg/dL (ref 0.3–1.2)
Total Protein: 7 g/dL (ref 6.5–8.1)

## 2018-11-14 LAB — CBC WITH DIFFERENTIAL/PLATELET
Abs Immature Granulocytes: 0.01 10*3/uL (ref 0.00–0.07)
Basophils Absolute: 0.1 10*3/uL (ref 0.0–0.1)
Basophils Relative: 1 %
Eosinophils Absolute: 0 10*3/uL (ref 0.0–0.5)
Eosinophils Relative: 0 %
HEMATOCRIT: 30.5 % — AB (ref 39.0–52.0)
Hemoglobin: 9 g/dL — ABNORMAL LOW (ref 13.0–17.0)
Immature Granulocytes: 0 %
LYMPHS ABS: 1.4 10*3/uL (ref 0.7–4.0)
Lymphocytes Relative: 27 %
MCH: 26.3 pg (ref 26.0–34.0)
MCHC: 29.5 g/dL — ABNORMAL LOW (ref 30.0–36.0)
MCV: 89.2 fL (ref 80.0–100.0)
Monocytes Absolute: 0.6 10*3/uL (ref 0.1–1.0)
Monocytes Relative: 12 %
Neutro Abs: 3.2 10*3/uL (ref 1.7–7.7)
Neutrophils Relative %: 60 %
Platelets: 266 10*3/uL (ref 150–400)
RBC: 3.42 MIL/uL — ABNORMAL LOW (ref 4.22–5.81)
RDW: 14.9 % (ref 11.5–15.5)
WBC: 5.2 10*3/uL (ref 4.0–10.5)
nRBC: 0 % (ref 0.0–0.2)

## 2018-11-14 LAB — CBG MONITORING, ED
GLUCOSE-CAPILLARY: 84 mg/dL (ref 70–99)
Glucose-Capillary: 69 mg/dL — ABNORMAL LOW (ref 70–99)
Glucose-Capillary: 112 mg/dL — ABNORMAL HIGH (ref 70–99)
Glucose-Capillary: 83 mg/dL (ref 70–99)

## 2018-11-14 LAB — I-STAT CG4 LACTIC ACID, ED: Lactic Acid, Venous: 1.52 mmol/L (ref 0.5–1.9)

## 2018-11-14 LAB — BASIC METABOLIC PANEL
Anion gap: 12 (ref 5–15)
BUN: 18 mg/dL (ref 8–23)
CO2: 20 mmol/L — ABNORMAL LOW (ref 22–32)
Calcium: 8.8 mg/dL — ABNORMAL LOW (ref 8.9–10.3)
Chloride: 103 mmol/L (ref 98–111)
Creatinine, Ser: 1.6 mg/dL — ABNORMAL HIGH (ref 0.61–1.24)
GFR calc Af Amer: 52 mL/min — ABNORMAL LOW (ref >60)
GFR calc non Af Amer: 45 mL/min — ABNORMAL LOW (ref >60)
GLUCOSE: 76 mg/dL (ref 70–99)
Potassium: 4.4 mmol/L (ref 3.5–5.1)
Sodium: 135 mmol/L (ref 135–145)

## 2018-11-14 LAB — HIV ANTIBODY (ROUTINE TESTING W REFLEX): HIV Screen 4th Generation wRfx: NONREACTIVE

## 2018-11-14 LAB — CK: Total CK: 1181 U/L — ABNORMAL HIGH (ref 49–397)

## 2018-11-14 LAB — TROPONIN I: Troponin I: 0.03 ng/mL (ref <0.03)

## 2018-11-14 MED ORDER — THIAMINE HCL 100 MG/ML IJ SOLN: 100.0000 mg | Freq: Every day | INTRAMUSCULAR | Status: DC

## 2018-11-14 MED ORDER — ADULT MULTIVITAMIN W/MINERALS CH
1.0000 | ORAL_TABLET | Freq: Every day | ORAL | Status: DC
  Administered 2018-11-14 – 2018-11-19 (×6): 1 via ORAL
  Filled 2018-11-14 (×6): qty 1

## 2018-11-14 MED ORDER — SODIUM CHLORIDE 0.9 % IV SOLN
INTRAVENOUS | Status: AC
  Administered 2018-11-14 (×2): via INTRAVENOUS

## 2018-11-14 MED ORDER — LORAZEPAM 1 MG PO TABS: 1.0000 mg | ORAL_TABLET | Freq: Four times a day (QID) | ORAL | Status: AC | PRN

## 2018-11-14 MED ORDER — ONDANSETRON HCL 4 MG PO TABS: 4.0000 mg | ORAL_TABLET | Freq: Four times a day (QID) | ORAL | Status: DC | PRN

## 2018-11-14 MED ORDER — ACETAMINOPHEN 650 MG RE SUPP: 650.0000 mg | Freq: Four times a day (QID) | RECTAL | Status: DC | PRN

## 2018-11-14 MED ORDER — ONDANSETRON HCL 4 MG/2ML IJ SOLN: 4.0000 mg | Freq: Four times a day (QID) | INTRAMUSCULAR | Status: DC | PRN

## 2018-11-14 MED ORDER — VITAMIN B-1 100 MG PO TABS
100.0000 mg | ORAL_TABLET | Freq: Every day | ORAL | Status: DC
  Administered 2018-11-14 – 2018-11-19 (×6): 100 mg via ORAL
  Filled 2018-11-14 (×6): qty 1

## 2018-11-14 MED ORDER — LORAZEPAM 2 MG/ML IJ SOLN: 1.0000 mg | Freq: Four times a day (QID) | INTRAMUSCULAR | Status: AC | PRN

## 2018-11-14 MED ORDER — FOLIC ACID 1 MG PO TABS
1.0000 mg | ORAL_TABLET | Freq: Every day | ORAL | Status: DC
  Administered 2018-11-14 – 2018-11-19 (×6): 1 mg via ORAL
  Filled 2018-11-14 (×6): qty 1

## 2018-11-14 MED ORDER — LEVETIRACETAM IN NACL 500 MG/100ML IV SOLN
500.0000 mg | Freq: Two times a day (BID) | INTRAVENOUS | Status: DC
  Administered 2018-11-14 – 2018-11-16 (×5): 500 mg via INTRAVENOUS
  Filled 2018-11-14 (×6): qty 100

## 2018-11-14 MED ORDER — ACETAMINOPHEN 325 MG PO TABS
650.0000 mg | ORAL_TABLET | Freq: Four times a day (QID) | ORAL | Status: DC | PRN
  Administered 2018-11-15 – 2018-11-16 (×2): 650 mg via ORAL
  Filled 2018-11-14: qty 2

## 2018-11-14 NOTE — ED Notes (Signed)
Patient currently at MRI

## 2018-11-14 NOTE — Progress Notes (Signed)
Coralie CarpenJohn Laidler is a 62 y.o. male with known history of seizures and alcoholism was brought to the ER after patient had shaking spells at home.  As per the report patient's wife called EMS and patient was found to be hypoglycemic with CBG of 60 following which patient was given some juice and it improved to 120.  Patient on exam had some left-sided weakness.  Patient was brought to the ER.  11/14/18: Seen and examined in the ED. Patient reports current alcohol abuse. MRI unremarkable for an acute intracranial findings. EEG no seizure activities.  Please refer to H&P dictated by Dr Toniann FailKakrakandy on 11/14/18 for further details of the assessment and plan.

## 2018-11-14 NOTE — ED Notes (Signed)
Dr. Toniann FailKakrakandy notified on pt.'s elevated Troponin=0.03

## 2018-11-14 NOTE — Progress Notes (Signed)
EEG completed, results pending. 

## 2018-11-14 NOTE — Procedures (Signed)
History: 62 yo M with AMS after seizure  Sedation: None  Technique: This is a 21 channel routine scalp EEG performed at the bedside with bipolar and monopolar montages arranged in accordance to the international 10/20 system of electrode placement. One channel was dedicated to EKG recording.    Background: There is a posterior dominant rhythm of 7 - 8hz . Even during maximal wakefullness, there is some mild irregular delta intrusion into the background. Much of the recording is in drowsiness or sleep, with normal appearing structures.   Photic stimulation: Physiologic driving is not performed  EEG Abnormalities: 1) generalized irregular delta activity 2) Slow PDR  Clinical Interpretation: This EEG is consistent with a mild generalized non-specific cerebral dysfunction(encephalopathy). There was no seizure or seizure predisposition recorded on this study. Please note that lack of epileptiform activity on EEG does not preclude the possibility of epilepsy.   Ritta SlotMcNeill Kirkpatrick, MD [facility] [phone]  If 7pm- 7am, please page neurology on call as listed in AMION.

## 2018-11-14 NOTE — H&P (Addendum)
History and Physical    Mason Hart ZOX:[id number] DOB: July 14, 1956 DOA: 11/13/2018  PCP: [name], [name]  Patient coming from: Home.  History obtained from ER physician as patient is mildly encephalopathic.  Family unable to be reached.  Chief Complaint: Seizures.  HPI: Mason Hart is a 62 y.o. male with known history of seizures and alcoholism was brought to the ER after patient had shaking spells at home.  As per the report patient's wife called EMS and patient was found to be hypoglycemic with CBG of 60 following which patient was given some juice and it improved to 120.  Patient on exam had some left-sided weakness.  Patient was brought to the ER.  ED Course: In the ER patient appears confused encephalopathic and had CT head and C-spine and x-ray of the chest.  Chest x-ray showed left clavicle fracture which probably could contribute to patient's left-sided weakness.  Patient remained encephalopathic and Dilantin levels were subtherapeutic.  Lactate was elevated and fluid was given for which it improved.  On-call neurologist was consulted and requested to place patient on Keppra loading dose 1 g and 500 twice daily.  Requested admission if patient remains encephalopathic and get MRI and EEG.  On my exam patient is following commands and moving extremities mildly weak on the right upper extremity.  Not able to recollect everything but able to remember his name and place season.  States he drinks alcohol every day.  Review of Systems: As per HPI, rest all negative.   Past Medical History:  Diagnosis Date  . Noncompliance with medication regimen   . Seizures (HCC)     History reviewed. No pertinent surgical history.   reports that he has never smoked. He has never used smokeless tobacco. He reports current alcohol use of about 7.0 standard drinks of alcohol per week. He reports that he does not use drugs.  No Known Allergies  Family History  Problem Relation Age of  Onset  . Heart disease Mother   . Cancer Neg Hx   . Diabetes Neg Hx   . Stroke Neg Hx     Prior to Admission medications   Medication Sig Start Date End Date Taking? Authorizing Provider  phenytoin (DILANTIN) 100 MG ER capsule Take 4 capsules (400 mg total) by mouth at bedtime. 10/14/18   Raeford Razor, MD    Physical Exam: Vitals:   11/14/18 0315 11/14/18 0330 11/14/18 0400 11/14/18 0430  BP: 108/67 102/62 (!) 105/57 (!) 103/53  Pulse: 86 90 83 90  Resp: 17 18 18 12   Temp:      TempSrc:      SpO2: 96% 97% 98% 92%  Weight:      Height:          Constitutional: Moderately built and nourished. Vitals:   11/14/18 0315 11/14/18 0330 11/14/18 0400 11/14/18 0430  BP: 108/67 102/62 (!) 105/57 (!) 103/53  Pulse: 86 90 83 90  Resp: 17 18 18 12   Temp:      TempSrc:      SpO2: 96% 97% 98% 92%  Weight:      Height:       Eyes: Anicteric no pallor. ENMT: No discharge from the ears eyes nose or mouth. Neck: No mass felt.  No neck rigidity. Respiratory: No rhonchi or crepitations. Cardiovascular: S1-S2 heard. Abdomen: Soft nontender bowel sounds present. Musculoskeletal: No edema.  Finding it difficult to move his right upper extremity. Skin: No rash. Neurologic: Patient is mildly lethargic  but answers questions appropriately but not able to recollect everything.  No oriented to his name and place.  Right upper extremity weakness.  All other extremities are generally weak. Psychiatric: Mildly lethargic.   Labs on Admission: I have personally reviewed following labs and imaging studies  CBC: Recent Labs  Lab 11/13/18 2206  WBC 8.1  NEUTROABS 6.8  HGB 10.5*  HCT 34.7*  MCV 88.5  PLT 281   Basic Metabolic Panel: Recent Labs  Lab 11/13/18 2206  NA 134*  K 4.2  CL 101  CO2 20*  GLUCOSE 107*  BUN 17  CREATININE 1.76*  CALCIUM 9.7   GFR: Estimated Creatinine Clearance: 51.8 mL/min (A) (by C-G formula based on SCr of 1.76 mg/dL (H)). Liver Function  Tests: Recent Labs  Lab 11/13/18 2206  AST 40  ALT 22  ALKPHOS 81  BILITOT 0.4  PROT 8.0  ALBUMIN 3.5   No results for input(s): LIPASE, AMYLASE in the last 168 hours. Recent Labs  Lab 11/13/18 2206  AMMONIA 24   Coagulation Profile: No results for input(s): INR, PROTIME in the last 168 hours. Cardiac Enzymes: No results for input(s): CKTOTAL, CKMB, CKMBINDEX, TROPONINI in the last 168 hours. BNP (last 3 results) No results for input(s): PROBNP in the last 8760 hours. HbA1C: No results for input(s): HGBA1C in the last 72 hours. CBG: Recent Labs  Lab 11/13/18 2212  GLUCAP 87   Lipid Profile: No results for input(s): CHOL, HDL, LDLCALC, TRIG, CHOLHDL, LDLDIRECT in the last 72 hours. Thyroid Function Tests: No results for input(s): TSH, T4TOTAL, FREET4, T3FREE, THYROIDAB in the last 72 hours. Anemia Panel: No results for input(s): VITAMINB12, FOLATE, FERRITIN, TIBC, IRON, RETICCTPCT in the last 72 hours. Urine analysis:    Component Value Date/Time   COLORURINE AMBER (A) 11/13/2018 2311   APPEARANCEUR TURBID (A) 11/13/2018 2311   LABSPEC 1.024 11/13/2018 2311   PHURINE 5.0 11/13/2018 2311   GLUCOSEU NEGATIVE 11/13/2018 2311   HGBUR MODERATE (A) 11/13/2018 2311   BILIRUBINUR SMALL (A) 11/13/2018 2311   KETONESUR 5 (A) 11/13/2018 2311   PROTEINUR 100 (A) 11/13/2018 2311   UROBILINOGEN 1.0 04/04/2012 1449   NITRITE NEGATIVE 11/13/2018 2311   LEUKOCYTESUR NEGATIVE 11/13/2018 2311   Sepsis Labs: @LABRCNTIP (procalcitonin:4,lacticidven:4) )No results found for this or any previous visit (from the past 240 hour(s)).   Radiological Exams on Admission: Ct Head Wo Contrast  Result Date: 11/13/2018 CLINICAL DATA:  History of seizures with altered level of consciousness. EXAM: CT HEAD WITHOUT CONTRAST CT CERVICAL SPINE WITHOUT CONTRAST TECHNIQUE: Multidetector CT imaging of the head and cervical spine was performed following the standard protocol without intravenous  contrast. Multiplanar CT image reconstructions of the cervical spine were also generated. COMPARISON:  01/24/2018 FINDINGS: CT HEAD FINDINGS Brain: Chronic stable age related involutional changes of the brain. Chronic mild-to-moderate small vessel ischemia the periventricular and subcortical white matter. No large vascular territory infarct, hemorrhage, intra-axial mass nor extra-axial fluid. No effacement of the basal cisterns. The brainstem and cerebellum appear nonacute. Vascular: No hyperdense vessel sign. Skull: No acute calvarial fracture or significant calvarial soft tissue swelling. Sinuses/Orbits: Left maxillary sinus wall thickening with inspissated mucus and evidence left ostiomeatal unit obstruction causing opacification of the left frontal sinus, ethmoid and included maxillary sinuses. Findings are in keeping with chronic sinus disease. Intact orbits and globes. Other: None CT CERVICAL SPINE FINDINGS Alignment: Minimal anterolisthesis of C5 on C6. Skull base and vertebrae: Intact skull base. No acute cervical spine fracture. No suspicious osseous  lesions. Bulky osteophytes are noted off the anterior aspect of the cervical spine at all levels. This is between C3 and C5. Soft tissues and spinal canal: No prevertebral soft tissue swelling. No visible canal hematoma. Disc levels: Moderate to marked disc flattening at all levels of the cervical spine. Mild osseous central canal stenosis at C6-7. Uncovertebral joint osteoarthritis on the right at C2-3, bilaterally at C4-5, C5-6 and C6-7. These contribute to moderate encroachment greatest at C5-6 on the right. Upper chest: Negative. Other: None IMPRESSION: 1. Chronic mild-to-moderate small vessel ischemic disease of periventricular and subcortical white matter. No acute intracranial abnormality. 2. Chronic paranasal sinusitis. 3. Cervical spondylosis without acute cervical spine fracture. Electronically Signed   By: Tollie Ethavid  Kwon M.D.   On: 11/13/2018 22:50    Ct Cervical Spine Wo Contrast  Result Date: 11/13/2018 CLINICAL DATA:  History of seizures with altered level of consciousness. EXAM: CT HEAD WITHOUT CONTRAST CT CERVICAL SPINE WITHOUT CONTRAST TECHNIQUE: Multidetector CT imaging of the head and cervical spine was performed following the standard protocol without intravenous contrast. Multiplanar CT image reconstructions of the cervical spine were also generated. COMPARISON:  01/24/2018 FINDINGS: CT HEAD FINDINGS Brain: Chronic stable age related involutional changes of the brain. Chronic mild-to-moderate small vessel ischemia the periventricular and subcortical white matter. No large vascular territory infarct, hemorrhage, intra-axial mass nor extra-axial fluid. No effacement of the basal cisterns. The brainstem and cerebellum appear nonacute. Vascular: No hyperdense vessel sign. Skull: No acute calvarial fracture or significant calvarial soft tissue swelling. Sinuses/Orbits: Left maxillary sinus wall thickening with inspissated mucus and evidence left ostiomeatal unit obstruction causing opacification of the left frontal sinus, ethmoid and included maxillary sinuses. Findings are in keeping with chronic sinus disease. Intact orbits and globes. Other: None CT CERVICAL SPINE FINDINGS Alignment: Minimal anterolisthesis of C5 on C6. Skull base and vertebrae: Intact skull base. No acute cervical spine fracture. No suspicious osseous lesions. Bulky osteophytes are noted off the anterior aspect of the cervical spine at all levels. This is between C3 and C5. Soft tissues and spinal canal: No prevertebral soft tissue swelling. No visible canal hematoma. Disc levels: Moderate to marked disc flattening at all levels of the cervical spine. Mild osseous central canal stenosis at C6-7. Uncovertebral joint osteoarthritis on the right at C2-3, bilaterally at C4-5, C5-6 and C6-7. These contribute to moderate encroachment greatest at C5-6 on the right. Upper chest: Negative.  Other: None IMPRESSION: 1. Chronic mild-to-moderate small vessel ischemic disease of periventricular and subcortical white matter. No acute intracranial abnormality. 2. Chronic paranasal sinusitis. 3. Cervical spondylosis without acute cervical spine fracture. Electronically Signed   By: Tollie Ethavid  Kwon M.D.   On: 11/13/2018 22:50   Dg Chest Port 1 View  Result Date: 11/13/2018 CLINICAL DATA:  Altered mental status EXAM: PORTABLE CHEST 1 VIEW COMPARISON:  01/24/2018 FINDINGS: Shallow inspiration. Heart size and pulmonary vascularity are normal. Lungs appear clear and expanded. No blunting of costophrenic angles. No pneumothorax. Mediastinal contours appear intact. Degenerative changes in the thoracic spine and shoulders. Displaced fracture of the midshaft left clavicle, new since previous study. IMPRESSION: No evidence of active pulmonary disease. Displaced fracture of the midshaft left clavicle. Electronically Signed   By: Burman NievesWilliam  Stevens M.D.   On: 11/13/2018 22:38     Assessment/Plan Principal Problem:   Seizure (HCC) Active Problems:   Hypoglycemia   Alcohol use   ARF (acute renal failure) (HCC)   Clavicle fracture    1. Seizures presently post encephalopathic still mildly confused  but follows commands -appreciate neurology consultant recommendation.  Patient has been placed on Keppra.  Since patient is still mildly encephalopathic and as recommended by the neurologist MRI brain and EEG has been ordered. 2. Hypoglycemic episodes has been found to be hypoglycemic even during her previous admissions.  Closely follow CBGs.  If tends to remain hypoglycemic persistently will need further hypoglycemia work-up. 3. History of alcohol abuse placed on CIWA.  Thiamine. 4. Acute renal failure suspect likely from poor oral intake continue with hydration and follow metabolic panel. 5. Left clavicle fracture likely from fall or trauma -sling ordered.  Check x-ray pelvis.  Check CK levels. 6. Anemia appears  to be chronic follow CBC.  EKG x-ray of the right shoulder pelvis and MRI brain are pending.   DVT prophylaxis: SCDs for now until we recheck the CBC since recent CBC showed thrombocytopenia. Code Status: Full code. Family Communication: No family at the bedside. Disposition Plan: To be determined. Consults called: Neurology. Admission status: Observation.   Eduard Clos MD [facility] Pager [phone].  If 7PM-7AM, please contact night-coverage [email or web address] Password [facility]  11/14/2018, 4:39 AM

## 2018-11-14 NOTE — ED Notes (Signed)
Pt's CBG result was 84. Informed Anna - RN.

## 2018-11-14 NOTE — ED Notes (Signed)
PT not able to walk. Noted to be very altered and could not follow commands well. Shakes and unsteady upon sitting up.

## 2018-11-14 NOTE — ED Notes (Addendum)
Dr. Toniann FailKakrakandy ( admitting MD ) notified on pt.'s hypoglycemia-69 . Patient given orange juice , Malawiturkey sandwich and apple sauce .

## 2018-11-14 NOTE — ED Provider Notes (Signed)
12:00 AM  Assumed care from Dr. Lockie Molauratolo.  Patient is a 62 year old male with known history of seizures on Dilantin who presents to the emergency department with a seizure today.  Initially had left-sided weakness on exam that has improved.  Found to have a left clavicle fracture and is currently in a sling.  Was found to be slightly hypothermic with temperature of 96.6 and a lactate of 4.8 which is likely from his seizure.  No obvious infectious etiology present.  Patient seen by neurology who recommended loading patient with IV Keppra and then discharging on Keppra 500 mg twice daily.  He will need outpatient neurology follow-up.  Plan is to repeat lactate after IV fluids, ambulate patient, recheck temperature.  1:35 AM  Pt's lactate has improved as well as his body temperature.  He has no complaints of pain at this time.  He is oriented to person and place but not to time.  It is unclear if this is his baseline.  Will ambulate patient in the emergency department and continue to monitor.  No further seizure-like activity witnessed here.  2:44 AM  Pt still altered and unable to ambulate.  Given his prolonged postictal state, will admit.  Updated neurology.  3:20 AM Discussed patient's case with hospitalist, Dr. Toniann FailKakrakandy.  I have recommended admission and patient (and family if present) agree with this plan. Admitting physician will place admission orders.   I reviewed all nursing notes, vitals, pertinent previous records, EKGs, lab and urine results, imaging (as available).     Ward, Layla MawKristen N, DO 11/14/18 [phone]

## 2018-11-14 NOTE — Progress Notes (Signed)
Mason Hart Mason Hart [id number] Admission Data: 11/14/2018 6:53 PM Attending Provider: Eduard ClosKakrakandy, Arshad N, MD  WUJ:[id number]:Hairston, Oren BeckmannMandesia R, [name] Consults/ Treatment Team:   Mason Hart Mason Hart is a 62 y.o. male patient admitted from ED awake, alert  & orientated  X 3,  Full Code, VSS - Blood pressure (!) 95/48, pulse 80, temperature 97.6 F (36.4 C), temperature source Oral, resp. rate 17, height 6\' 1"  (1.854 m), weight 89.1 kg, SpO2 100 %.no c/o shortness of breath, no c/o chest pain, no distress noted. Tele # 34 placed and pt is currently running:normal sinus rhythm.   IV site WDL:  wrist left, condition patent and no redness with a transparent dsg that's clean dry and intact.  Allergies:  No Known Allergies   Past Medical History:  Diagnosis Date  . Noncompliance with medication regimen   . Seizures (HCC)     History:  obtained from chart review. Tobacco/alcohol: denied history unreliable  Pt orientation to unit, room and routine. Information packet given to patient/family and safety video watched.  Admission INP armband ID verified with patient/family, and in place. SR up x 2, fall risk assessment complete with Patient and family verbalizing understanding of risks associated with falls. Pt verbalizes an understanding of how to use the call bell and to call for help before getting out of bed.  Skin, clean-dry- intact without evidence of bruising, or skin tears.   No evidence of skin break down noted on exam. no rashes, no ecchymoses, no petechiae, no nodules, no jaundice, no purpura, no wounds    Will cont to monitor and assist as needed.  Camillo FlamingVicki L Takumi Din, RN 11/14/2018 6:53 PM

## 2018-11-14 NOTE — ED Notes (Signed)
Attempted report 

## 2018-11-14 NOTE — Progress Notes (Addendum)
Reason for consult:   Subjective: No further seizures since admission.  He is awake alert and following commands.   ROS: negative except above  Examination  Vital signs in last 24 hours: Temp:  [96.6 F (35.9 C)-99.2 F (37.3 C)] 98.5 F (36.9 C) (12/19 2004) Pulse Rate:  [80-97] 80 (12/19 2004) Resp:  [11-27] 18 (12/19 2004) BP: (88-124)/(48-74) 100/55 (12/19 2004) SpO2:  [92 %-100 %] 100 % (12/19 2004) Weight:  [89.1 kg-96.2 kg] 89.1 kg (12/19 1313)  General: lying in bed CVS: pulse-normal rate and rhythm RS: breathing comfortably Extremities: normal   Neuro: MS: Alert, oriented, follows commands CN: pupils equal and reactive,  EOMI, face symmetric, tongue midline, normal sensation over face, Motor: RUE 3+/5 strength, RLE 4/5 strength LUE 5/5 strength, LLE 4/5 strength  Reflexes: plantars: flexor Coordination: normal Gait: not tested  Basic Metabolic Panel: Recent Labs  Lab 11/13/18 2206 11/14/18 0442  NA 134* 135  K 4.2 4.4  CL 101 103  CO2 20* 20*  GLUCOSE 107* 76  BUN 17 18  CREATININE 1.76* 1.60*  CALCIUM 9.7 8.8*    CBC: Recent Labs  Lab 11/13/18 2206 11/14/18 0442  WBC 8.1 5.2  NEUTROABS 6.8 3.2  HGB 10.5* 9.0*  HCT 34.7* 30.5*  MCV 88.5 89.2  PLT 281 266     Coagulation Studies: No results for input(s): LABPROT, INR in the last 72 hours.   EEG; 1) generalized irregular delta activity, 2) Slow PDR    ASSESSMENT AND PLAN   62 year old male with history of seizures and noncompliance on Dilantin presents with what was likely seizure activity.  Patient was postictal on arrival.  Was loaded with 1 g of Keppra and started on 500 mg twice daily.  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 Has h/o of hypoglycemic episodes and aclohol abuse. MRI brain shows no acute abnormality.  EEG showed generalized delta activity, no epileptiform  discharges.   Recommendations Continue Keppra 500mg  BID Continue CIWA protocol Seizure precautions   F/U with Neurology outpatient.   Per [facility] [place], patients with seizures are not allowed to drive until they have been seizure-free for six months. Use caution when using heavy equipment or power tools. Avoid working on ladders or at heights. Take showers instead of baths. Ensure the water temperature is not too high on the home water heater. Do not go swimming alone. Do not lock yourself in a room alone (i.e. bathroom). When caring for infants or small children, sit down when holding, feeding, or changing them to minimize risk of injury to the child in the event you have a seizure. Maintain good sleep hygiene. Avoid alcohol.    If Coralie CarpenJohn Reif has another seizure, call 911 and bring them back to the ED if:       A.  The seizure lasts longer than 5 minutes.            B.  The patient doesn't wake shortly after the seizure or has new problems such as difficulty seeing, speaking or moving following the seizure       C.  The patient was injured during the seizure       D.  The patient has a temperature over 102 F (39C)       E.  The patient vomited during the seizure and now is having trouble breathing   Sushanth Aroor [facility] Pager Number [phone] For  questions after 7pm please refer to AMION to reach the Neurologist on call

## 2018-11-15 DIAGNOSIS — R569 Unspecified convulsions: Secondary | ICD-10-CM | POA: Diagnosis not present

## 2018-11-15 LAB — BLOOD CULTURE ID PANEL (REFLEXED)
Acinetobacter baumannii: NOT DETECTED
Candida albicans: NOT DETECTED
Candida glabrata: NOT DETECTED
Candida krusei: NOT DETECTED
Candida parapsilosis: NOT DETECTED
Candida tropicalis: NOT DETECTED
ENTEROBACTERIACEAE SPECIES: NOT DETECTED
Enterobacter cloacae complex: NOT DETECTED
Enterococcus species: NOT DETECTED
Escherichia coli: NOT DETECTED
Haemophilus influenzae: NOT DETECTED
KLEBSIELLA PNEUMONIAE: NOT DETECTED
Klebsiella oxytoca: NOT DETECTED
Listeria monocytogenes: NOT DETECTED
Methicillin resistance: NOT DETECTED
Neisseria meningitidis: NOT DETECTED
Proteus species: NOT DETECTED
Pseudomonas aeruginosa: NOT DETECTED
Serratia marcescens: NOT DETECTED
Staphylococcus aureus (BCID): NOT DETECTED
Staphylococcus species: DETECTED — AB
Streptococcus agalactiae: NOT DETECTED
Streptococcus pneumoniae: NOT DETECTED
Streptococcus pyogenes: NOT DETECTED
Streptococcus species: NOT DETECTED

## 2018-11-15 LAB — GLUCOSE, CAPILLARY
GLUCOSE-CAPILLARY: 82 mg/dL (ref 70–99)
Glucose-Capillary: 111 mg/dL — ABNORMAL HIGH (ref 70–99)
Glucose-Capillary: 114 mg/dL — ABNORMAL HIGH (ref 70–99)
Glucose-Capillary: 81 mg/dL (ref 70–99)
Glucose-Capillary: 86 mg/dL (ref 70–99)
Glucose-Capillary: 87 mg/dL (ref 70–99)

## 2018-11-15 NOTE — Progress Notes (Signed)
PHARMACY - PHYSICIAN COMMUNICATION CRITICAL VALUE ALERT - BLOOD CULTURE IDENTIFICATION (BCID)  Mason Hart is an 62 y.o. male who [facility] with a chief complaint of seizures  Assessment:  WBC WNL, afebrile, CXR unremarkable   Name of physician (or Provider) Contacted: [place] ([facility])  Current antibiotics: None  Changes to prescribed antibiotics recommended:  Cont to monitor off anti-biotics for now  Results for orders placed or performed during the hospital encounter of 11/13/18  Blood Culture ID Panel (Reflexed) (Collected: 11/13/2018 11:00 PM)  Result Value Ref Range   Enterococcus species NOT DETECTED NOT DETECTED   Listeria monocytogenes NOT DETECTED NOT DETECTED   Staphylococcus species DETECTED (A) NOT DETECTED   Staphylococcus aureus (BCID) NOT DETECTED NOT DETECTED   Methicillin resistance NOT DETECTED NOT DETECTED   Streptococcus species NOT DETECTED NOT DETECTED   Streptococcus agalactiae NOT DETECTED NOT DETECTED   Streptococcus pneumoniae NOT DETECTED NOT DETECTED   Streptococcus pyogenes NOT DETECTED NOT DETECTED   Acinetobacter baumannii NOT DETECTED NOT DETECTED   Enterobacteriaceae species NOT DETECTED NOT DETECTED   Enterobacter cloacae complex NOT DETECTED NOT DETECTED   Escherichia coli NOT DETECTED NOT DETECTED   Klebsiella oxytoca NOT DETECTED NOT DETECTED   Klebsiella pneumoniae NOT DETECTED NOT DETECTED   Proteus species NOT DETECTED NOT DETECTED   Serratia marcescens NOT DETECTED NOT DETECTED   Haemophilus influenzae NOT DETECTED NOT DETECTED   Neisseria meningitidis NOT DETECTED NOT DETECTED   Pseudomonas aeruginosa NOT DETECTED NOT DETECTED   Candida albicans NOT DETECTED NOT DETECTED   Candida glabrata NOT DETECTED NOT DETECTED   Candida krusei NOT DETECTED NOT DETECTED   Candida parapsilosis NOT DETECTED NOT DETECTED   Candida tropicalis NOT DETECTED NOT DETECTED    Abran DukeLedford, Laiza Veenstra 11/15/2018  5:31 AM

## 2018-11-15 NOTE — Progress Notes (Addendum)
PROGRESS NOTE  Coralie CarpenJohn Rathbun ZOX:[id number] DOB: [birth year] DOA: 11/13/2018 PCP: [name], [name], [name]  HPI/Recap of past 24 hours: 62 y.o.malewithknown history of seizures and alcoholism was brought to the ER after patient had shaking spells at home. As per the report, patient's wife called EMS and patient was found to be hypoglycemic with CBG of 60. Patient was given some juice and it improved to 120. Patient on exam had some left-sided weakness. Patient was brought to the ER. MRI unremarkable for any acute intracranial findings. EEG no active seizure activities.  11/15/2018: Patient seen and examined at his bedside.  No acute events overnight.  He is alert and interactive.  PT assessed and recommended SNF due to generalized weakness.  Assessment/Plan: Principal Problem:   Seizure (HCC) Active Problems:   Hypoglycemia   Alcohol use   ARF (acute renal failure) (HCC)   Clavicle fracture  Seizures in the setting of seizure disorder Dilantin level low Continue Keppra 500 mg twice daily as recommended by neurology MRI brain and EEG unremarkable for any acute findings  Chronic alcohol abuse with concern for withdrawal Continue CIWA protocol Continue multivitamin thiamine and folate Fall precautions Counseling on alcohol cessation  Left clavicle fracture, present on admission Secondary to fall CPK elevated Continue IV fluid hydration  Gram-positive cocci blood culture 1 out of 2 Possibly contaminant Continue to closely monitor cultures  Ambulatory dysfunction/falls Could be related to chronic alcohol abuse PT assessed and recommended SNF Fall precautions Continue PT  Risks: Patient will require at least 2 midnights due to concern for withdrawal, and generalized weakness requiring SNF for physical rehab.  Patient is high risk for decompensation due to multiple comorbidities and advanced age.   DVT prophylaxis:  Subcu heparin 3 times daily Code Status: Full  code. Family Communication: No family at the bedside. Disposition Plan:  SNF when bed is available Consults called: Neurology.   Objective: Vitals:   11/14/18 1600 11/14/18 2004 11/15/18 0423 11/15/18 1348  BP: (!) 95/48 (!) 100/55 130/65 127/69  Pulse: 80 80 82 76  Resp: 17 18  20   Temp: 97.6 F (36.4 C) 98.5 F (36.9 C) 98.9 F (37.2 C) 99.8 F (37.7 C)  TempSrc: Oral Oral Oral Oral  SpO2: 100% 100% 100% 98%  Weight:      Height:        Intake/Output Summary (Last 24 hours) at 11/15/2018 1524 Last data filed at 11/15/2018 1348 Gross per 24 hour  Intake 1722.39 ml  Output 1050 ml  Net 672.39 ml   Filed Weights   11/13/18 2132 11/14/18 1313  Weight: 96.2 kg 89.1 kg    Exam:  . General: 62 y.o. year-old male well developed well nourished in no acute distress.  Alert and oriented x3. . Cardiovascular: Regular rate and rhythm with no rubs or gallops.  No thyromegaly or JVD noted.   Marland Kitchen. Respiratory: Clear to auscultation with no wheezes or rales. Good inspiratory effort. . Abdomen: Soft nontender nondistended with normal bowel sounds x4 quadrants. . Musculoskeletal: Mild right upper extremity edema. 2/4 pulses in all 4 extremities. . Skin: No ulcerative lesions noted or rashes . Psychiatry: Mood is appropriate for condition and setting   Data Reviewed: CBC: Recent Labs  Lab 11/13/18 2206 11/14/18 0442  WBC 8.1 5.2  NEUTROABS 6.8 3.2  HGB 10.5* 9.0*  HCT 34.7* 30.5*  MCV 88.5 89.2  PLT 281 266   Basic Metabolic Panel: Recent Labs  Lab 11/13/18 2206 11/14/18 0442  NA 134*  135  K 4.2 4.4  CL 101 103  CO2 20* 20*  GLUCOSE 107* 76  BUN 17 18  CREATININE 1.76* 1.60*  CALCIUM 9.7 8.8*   GFR: Estimated Creatinine Clearance: 52.7 mL/min (A) (by C-G formula based on SCr of 1.6 mg/dL (H)). Liver Function Tests: Recent Labs  Lab 11/13/18 2206 11/14/18 0442  AST 40 53*  ALT 22 22  ALKPHOS 81 68  BILITOT 0.4 0.6  PROT 8.0 7.0  ALBUMIN 3.5 3.1*   No  results for input(s): LIPASE, AMYLASE in the last 168 hours. Recent Labs  Lab 11/13/18 2206  AMMONIA 24   Coagulation Profile: No results for input(s): INR, PROTIME in the last 168 hours. Cardiac Enzymes: Recent Labs  Lab 11/14/18 0442  CKTOTAL 1,181*  TROPONINI 0.03*   BNP (last 3 results) No results for input(s): PROBNP in the last 8760 hours. HbA1C: No results for input(s): HGBA1C in the last 72 hours. CBG: Recent Labs  Lab 11/14/18 2046 11/15/18 0035 11/15/18 0421 11/15/18 0810 11/15/18 1135  GLUCAP 122* 86 81 87 114*   Lipid Profile: No results for input(s): CHOL, HDL, LDLCALC, TRIG, CHOLHDL, LDLDIRECT in the last 72 hours. Thyroid Function Tests: No results for input(s): TSH, T4TOTAL, FREET4, T3FREE, THYROIDAB in the last 72 hours. Anemia Panel: No results for input(s): VITAMINB12, FOLATE, FERRITIN, TIBC, IRON, RETICCTPCT in the last 72 hours. Urine analysis:    Component Value Date/Time   COLORURINE AMBER (A) 11/13/2018 2311   APPEARANCEUR TURBID (A) 11/13/2018 2311   LABSPEC 1.024 11/13/2018 2311   PHURINE 5.0 11/13/2018 2311   GLUCOSEU NEGATIVE 11/13/2018 2311   HGBUR MODERATE (A) 11/13/2018 2311   BILIRUBINUR SMALL (A) 11/13/2018 2311   KETONESUR 5 (A) 11/13/2018 2311   PROTEINUR 100 (A) 11/13/2018 2311   UROBILINOGEN 1.0 04/04/2012 1449   NITRITE NEGATIVE 11/13/2018 2311   LEUKOCYTESUR NEGATIVE 11/13/2018 2311   Sepsis Labs: @LABRCNTIP (procalcitonin:4,lacticidven:4)  ) Recent Results (from the past 240 hour(s))  Blood culture (routine x 2)     Status: None (Preliminary result)   Collection Time: 11/13/18 11:00 PM  Result Value Ref Range Status   Specimen Description BLOOD LEFT UPPER ARM  Final   Special Requests   Final    BOTTLES DRAWN AEROBIC AND ANAEROBIC Blood Culture adequate volume   Culture  Setup Time   Final    GRAM POSITIVE COCCI AEROBIC BOTTLE ONLY Organism ID to follow CRITICAL RESULT CALLED TO, READ BACK BY AND VERIFIED WITHMelven Sartorius [facility] 1610 11/15/18 A BROWNING    Culture   Final    NO GROWTH 2 DAYS Performed at [facility], [address], [place], [place] [address]    Report Status PENDING  Incomplete  Blood Culture ID Panel (Reflexed)     Status: Abnormal   Collection Time: 11/13/18 11:00 PM  Result Value Ref Range Status   Enterococcus species NOT DETECTED NOT DETECTED Final   Listeria monocytogenes NOT DETECTED NOT DETECTED Final   Staphylococcus species DETECTED (A) NOT DETECTED Final    Comment: Methicillin (oxacillin) susceptible coagulase negative staphylococcus. Possible blood culture contaminant (unless isolated from more than one blood culture draw or clinical case suggests pathogenicity). No antibiotic treatment is indicated for blood  culture contaminants. CRITICAL RESULT CALLED TO, READ BACK BY AND VERIFIED WITHMelven Sartorius PHARMD 4098 11/15/18 A BROWNING    Staphylococcus aureus (BCID) NOT DETECTED NOT DETECTED Final   Methicillin resistance NOT DETECTED NOT DETECTED Final   Streptococcus species NOT  DETECTED NOT DETECTED Final   Streptococcus agalactiae NOT DETECTED NOT DETECTED Final   Streptococcus pneumoniae NOT DETECTED NOT DETECTED Final   Streptococcus pyogenes NOT DETECTED NOT DETECTED Final   Acinetobacter baumannii NOT DETECTED NOT DETECTED Final   Enterobacteriaceae species NOT DETECTED NOT DETECTED Final   Enterobacter cloacae complex NOT DETECTED NOT DETECTED Final   Escherichia coli NOT DETECTED NOT DETECTED Final   Klebsiella oxytoca NOT DETECTED NOT DETECTED Final   Klebsiella pneumoniae NOT DETECTED NOT DETECTED Final   Proteus species NOT DETECTED NOT DETECTED Final   Serratia marcescens NOT DETECTED NOT DETECTED Final   Haemophilus influenzae NOT DETECTED NOT DETECTED Final   Neisseria meningitidis NOT DETECTED NOT DETECTED Final   Pseudomonas aeruginosa NOT DETECTED NOT DETECTED Final   Candida albicans NOT DETECTED NOT DETECTED Final   Candida glabrata NOT  DETECTED NOT DETECTED Final   Candida krusei NOT DETECTED NOT DETECTED Final   Candida parapsilosis NOT DETECTED NOT DETECTED Final   Candida tropicalis NOT DETECTED NOT DETECTED Final    Comment: Performed at [facility], [address] [place], [place] [address]  Blood culture (routine x 2)     Status: None (Preliminary result)   Collection Time: 11/13/18 11:03 PM  Result Value Ref Range Status   Specimen Description BLOOD RIGHT HAND  Final   Special Requests   Final    BOTTLES DRAWN AEROBIC AND ANAEROBIC Blood Culture results may not be optimal due to an excessive volume of blood received in culture bottles   Culture   Final    NO GROWTH 2 DAYS Performed at [facility], [address] [place], [place] [address]    Report Status PENDING  Incomplete      Studies: No results found.  Scheduled Meds: . folic acid  1 mg Oral Daily  . multivitamin with minerals  1 tablet Oral Daily  . thiamine  100 mg Oral Daily    Continuous Infusions: . levETIRAcetam 500 mg (11/15/18 0852)     LOS: 0 days     Darlin Droparole N , MD [facility] Pager [phone]  If 7PM-7AM, please contact night-coverage [email or web address] Password [email or web address] 11/15/2018, 3:24 PM

## 2018-11-15 NOTE — Evaluation (Signed)
Physical Therapy Evaluation Patient Details Name: Mason Hart MRN: [id number] DOB: Jul 10, 1956 Today's Date: 11/15/2018   History of Present Illness  Pt adm with seizure. Pt also found to have lt clavicle fx. PMH - seizure, ETOH,   Clinical Impression  Pt admitted with above diagnosis and presents to PT with functional limitations due to deficits listed below (See PT problem list). Pt needs skilled PT to maximize independence and safety to allow discharge to SNF.  Pt with very poor mobility due to pain vs weakness.      Follow Up Recommendations SNF    Equipment Recommendations  Other (comment)(TBD)    Recommendations for Other Services       Precautions / Restrictions Precautions Precautions: Fall Required Braces or Orthoses: Sling(left) Restrictions Weight Bearing Restrictions: No      Mobility  Bed Mobility Overal bed mobility: Needs Assistance Bed Mobility: Supine to Sit;Sit to Supine     Supine to sit: Total assist Sit to supine: Total assist   General bed mobility comments: Assist for all aspects. Pt c/o back pain and provides very little assistance  Transfers                 General transfer comment: Unable  Ambulation/Gait             General Gait Details: Unable  Stairs            Wheelchair Mobility    Modified Rankin (Stroke Patients Only)       Balance Overall balance assessment: Needs assistance Sitting-balance support: Feet supported;No upper extremity supported Sitting balance-Leahy Scale: Fair                                       Pertinent Vitals/Pain Pain Assessment: Faces Faces Pain Scale: Hurts whole lot Pain Location: low back with mobility Pain Descriptors / Indicators: Grimacing;Guarding Pain Intervention(s): Limited activity within patient's tolerance;Monitored during session;Repositioned    Home Living Family/patient expects to be discharged to:: Private residence Living Arrangements:  Alone   Type of Home: House Home Access: Level entry     Home Layout: One level Home Equipment: Environmental consultant - 2 wheels      Prior Function Level of Independence: Independent with assistive device(s)         Comments: Amb with cane     Hand Dominance   Dominant Hand: Left    Extremity/Trunk Assessment   Upper Extremity Assessment Upper Extremity Assessment: LUE deficits/detail;Generalized weakness LUE Deficits / Details: Decr use of LE - pain from clavicle vs weakness    Lower Extremity Assessment Lower Extremity Assessment: RLE deficits/detail;LLE deficits/detail RLE Deficits / Details: strength <3/5 - weakness vs pain LLE Deficits / Details: strength <3/5 - weakness vs pain       Communication   Communication: No difficulties  Cognition Arousal/Alertness: Awake/alert Behavior During Therapy: Flat affect Overall Cognitive Status: Impaired/Different from baseline Area of Impairment: Following commands;Safety/judgement;Problem solving                       Following Commands: Follows multi-step commands inconsistently;Follows multi-step commands with increased time Safety/Judgement: Decreased awareness of deficits   Problem Solving: Slow processing;Decreased initiation;Requires verbal cues;Requires tactile cues;Difficulty sequencing        General Comments      Exercises     Assessment/Plan    PT Assessment Patient needs continued PT services  PT Problem  List Decreased strength;Decreased activity tolerance;Decreased range of motion;Decreased balance;Decreased mobility;Decreased cognition;Decreased knowledge of use of DME;Decreased safety awareness;Pain       PT Treatment Interventions DME instruction;Gait training;Functional mobility training;Therapeutic activities;Therapeutic exercise;Balance training;Patient/family education    PT Goals (Current goals can be found in the Care Plan section)  Acute Rehab PT Goals Patient Stated Goal: not  stated PT Goal Formulation: With patient Time For Goal Achievement: 11/29/18 Potential to Achieve Goals: Fair    Frequency Min 2X/week   Barriers to discharge Decreased caregiver support lives alone    Co-evaluation               AM-PAC PT "6 Clicks" Mobility  Outcome Measure Help needed turning from your back to your side while in a flat bed without using bedrails?: Total Help needed moving from lying on your back to sitting on the side of a flat bed without using bedrails?: Total Help needed moving to and from a bed to a chair (including a wheelchair)?: Total Help needed standing up from a chair using your arms (e.g., wheelchair or bedside chair)?: Total Help needed to walk in hospital room?: Total Help needed climbing 3-5 steps with a railing? : Total 6 Click Score: 6    End of Session   Activity Tolerance: Patient limited by pain Patient left: in bed;with call bell/phone within reach;with bed alarm set Nurse Communication: Mobility status PT Visit Diagnosis: Other abnormalities of gait and mobility (R26.89);Muscle weakness (generalized) (M62.81);Pain Pain - part of body: (back)    Time: 7829-5621 PT Time Calculation (min) (ACUTE ONLY): 28 min   Charges:   PT Evaluation $PT Eval Moderate Complexity: 1 Mod PT Treatments $Therapeutic Activity: 8-22 mins        [facility] PT Acute Rehabilitation Services Pager [phone] Office [phone]   Angelina Ok [facility] 11/15/2018, 1:49 PM

## 2018-11-16 DIAGNOSIS — R569 Unspecified convulsions: Secondary | ICD-10-CM | POA: Diagnosis not present

## 2018-11-16 LAB — GLUCOSE, CAPILLARY
GLUCOSE-CAPILLARY: 70 mg/dL (ref 70–99)
Glucose-Capillary: 82 mg/dL (ref 70–99)
Glucose-Capillary: 89 mg/dL (ref 70–99)
Glucose-Capillary: 97 mg/dL (ref 70–99)
Glucose-Capillary: 89 mg/dL (ref 70–99)
Glucose-Capillary: 81 mg/dL (ref 70–99)

## 2018-11-16 MED ORDER — OXYCODONE HCL 5 MG PO TABS: 5.0000 mg | ORAL_TABLET | ORAL | Status: DC | PRN

## 2018-11-16 MED ORDER — OXYCODONE HCL 5 MG PO TABS
5.0000 mg | ORAL_TABLET | Freq: Four times a day (QID) | ORAL | Status: DC | PRN
  Administered 2018-11-18 – 2018-11-19 (×2): 5 mg via ORAL
  Filled 2018-11-16 (×2): qty 1

## 2018-11-16 MED ORDER — HEPARIN SODIUM (PORCINE) 5000 UNIT/ML IJ SOLN
5000.0000 [IU] | Freq: Three times a day (TID) | INTRAMUSCULAR | Status: DC
  Administered 2018-11-16 – 2018-11-19 (×7): 5000 [IU] via SUBCUTANEOUS
  Filled 2018-11-16 (×8): qty 1

## 2018-11-16 MED ORDER — LEVETIRACETAM 500 MG PO TABS
500.0000 mg | ORAL_TABLET | Freq: Two times a day (BID) | ORAL | Status: DC
  Administered 2018-11-16 – 2018-11-19 (×5): 500 mg via ORAL
  Filled 2018-11-16 (×5): qty 1

## 2018-11-16 MED ORDER — SODIUM CHLORIDE 0.45 % IV SOLN
INTRAVENOUS | Status: DC
  Administered 2018-11-16 – 2018-11-17 (×2): via INTRAVENOUS
  Administered 2018-11-18: 900 mL via INTRAVENOUS

## 2018-11-16 NOTE — Progress Notes (Signed)
Paged Dr. Margo AyeHall and let her know that pt cannot lift right arm off the bed, and right grip is weak.  Pt's speech is slurred (per his norm?) and pt complains of back and neck pain.

## 2018-11-16 NOTE — Plan of Care (Signed)
Pt cannot move right arm, complains of back and neck pain.  Around 9 am, I reported to Dr. Margo AyeHall who called me back and said she would come and see the patient.

## 2018-11-16 NOTE — Progress Notes (Addendum)
PROGRESS NOTE  Mason CarpenJohn Hart UJW:[id number] DOB: January 06, 1956 DOA: 11/13/2018 PCP: [name], [name], [name]  HPI/Recap of past 24 hours: 62 y.o.malewithknown history of seizures and alcoholism was brought to the ER after patient had shaking spells at home. As per the report, patient's wife called EMS and patient was found to be hypoglycemic with CBG of 60. Patient was given some juice and it improved to 120. Patient on exam had some left-sided weakness. Patient was brought to the ER. MRI unremarkable for any acute intracranial findings. EEG no active seizure activities.  11/15/2018: Patient seen and examined at his bedside.  No acute events overnight.  He is alert and interactive.  PT assessed and recommended SNF due to generalized weakness.  11/16/2018: Patient seen and examined at his bedside.  No acute events overnight.  He is alert and interactive.  PT assessed and recommended SNF due to generalized weakness.  He is agreeable.  He reports mild pain in his left shoulder after taking his pain medication.  Assessment/Plan: Principal Problem:   Seizure (HCC) Active Problems:   Hypoglycemia   Alcohol use   ARF (acute renal failure) (HCC)   Clavicle fracture  Seizures in the setting of seizure disorder Dilantin level low Continue Keppra 500 mg twice daily as recommended by neurology MRI brain and EEG unremarkable for any acute findings Independently reviewed MRI brain done on admission  Right upper and right lower extremity weakness, present on admission Negative MRI for any acute findings Neurology suspects Todd's paralysis  Right upper extremity swelling with tenderness Obtain duplex ultrasound of right upper extremity to rule out DVT If negative could be dependent edema Out of bed to chair with every shift  Chronic alcohol abuse with concern for withdrawal Continue CIWA protocol Continue multivitamin thiamine and folate Fall precautions Counseling on alcohol  cessation  Left clavicle fracture, present on admission Secondary to fall CPK elevated Continue IV fluid hydration Repeat CPK level in the morning  Cervical spondylosis without acute cervical fracture Pain management in place as needed  Gram-positive cocci coagulase negative blood culture 1 out of 2 Possibly contaminant Continue to closely monitor cultures Repeat blood cultures x2 peripherally  Ambulatory dysfunction/falls Could be related to chronic alcohol abuse Negative MRI for any acute findings PT assessed and recommended SNF Fall precautions Continue PT  Risks: Patient will require at least 2 midnights due to concern for withdrawal, and generalized weakness requiring SNF for physical rehab.  Patient is high risk for decompensation due to multiple comorbidities and advanced age.   DVT prophylaxis:  Subcu heparin 3 times daily Code Status: Full code. Family Communication: No family at the bedside. Disposition Plan:  SNF when bed is available Consults called: Neurology.   Objective: Vitals:   11/16/18 0005 11/16/18 0541 11/16/18 0800 11/16/18 1136  BP: 106/64 130/75  123/73  Pulse: 75 74 80 76  Resp: 16 16  20   Temp: 99 F (37.2 C) 99.3 F (37.4 C)  98.1 F (36.7 C)  TempSrc: Oral Oral  Oral  SpO2: 100% 100%  96%  Weight:      Height:        Intake/Output Summary (Last 24 hours) at 11/16/2018 1600 Last data filed at 11/16/2018 1334 Gross per 24 hour  Intake 860 ml  Output 525 ml  Net 335 ml   Filed Weights   11/13/18 2132 11/14/18 1313  Weight: 96.2 kg 89.1 kg    Exam:  . General: 62 y.o. year-old male well-developed well-nourished in no  acute distress.  Alert and interactive. . Cardiovascular: Regular rate and rhythm with no rubs or gallops.  No JVD or thyromegaly noted. Marland Kitchen. Respiratory: Clear to auscultation with no wheezes or rales.  Good inspiratory effort.. . Abdomen: Soft nontender nondistended with normal bowel sounds x4  quadrants. . Musculoskeletal: Right upper extremity edema. 2/4 pulses in all 4 extremities. Marland Kitchen. Psychiatry: Mood is appropriate for condition and setting   Data Reviewed: CBC: Recent Labs  Lab 11/13/18 2206 11/14/18 0442  WBC 8.1 5.2  NEUTROABS 6.8 3.2  HGB 10.5* 9.0*  HCT 34.7* 30.5*  MCV 88.5 89.2  PLT 281 266   Basic Metabolic Panel: Recent Labs  Lab 11/13/18 2206 11/14/18 0442  NA 134* 135  K 4.2 4.4  CL 101 103  CO2 20* 20*  GLUCOSE 107* 76  BUN 17 18  CREATININE 1.76* 1.60*  CALCIUM 9.7 8.8*   GFR: Estimated Creatinine Clearance: 52.7 mL/min (A) (by C-G formula based on SCr of 1.6 mg/dL (H)). Liver Function Tests: Recent Labs  Lab 11/13/18 2206 11/14/18 0442  AST 40 53*  ALT 22 22  ALKPHOS 81 68  BILITOT 0.4 0.6  PROT 8.0 7.0  ALBUMIN 3.5 3.1*   No results for input(s): LIPASE, AMYLASE in the last 168 hours. Recent Labs  Lab 11/13/18 2206  AMMONIA 24   Coagulation Profile: No results for input(s): INR, PROTIME in the last 168 hours. Cardiac Enzymes: Recent Labs  Lab 11/14/18 0442  CKTOTAL 1,181*  TROPONINI 0.03*   BNP (last 3 results) No results for input(s): PROBNP in the last 8760 hours. HbA1C: No results for input(s): HGBA1C in the last 72 hours. CBG: Recent Labs  Lab 11/15/18 2112 11/16/18 0002 11/16/18 0539 11/16/18 0752 11/16/18 1134  GLUCAP 111* 89 82 70 97   Lipid Profile: No results for input(s): CHOL, HDL, LDLCALC, TRIG, CHOLHDL, LDLDIRECT in the last 72 hours. Thyroid Function Tests: No results for input(s): TSH, T4TOTAL, FREET4, T3FREE, THYROIDAB in the last 72 hours. Anemia Panel: No results for input(s): VITAMINB12, FOLATE, FERRITIN, TIBC, IRON, RETICCTPCT in the last 72 hours. Urine analysis:    Component Value Date/Time   COLORURINE AMBER (A) 11/13/2018 2311   APPEARANCEUR TURBID (A) 11/13/2018 2311   LABSPEC 1.024 11/13/2018 2311   PHURINE 5.0 11/13/2018 2311   GLUCOSEU NEGATIVE 11/13/2018 2311   HGBUR  MODERATE (A) 11/13/2018 2311   BILIRUBINUR SMALL (A) 11/13/2018 2311   KETONESUR 5 (A) 11/13/2018 2311   PROTEINUR 100 (A) 11/13/2018 2311   UROBILINOGEN 1.0 04/04/2012 1449   NITRITE NEGATIVE 11/13/2018 2311   LEUKOCYTESUR NEGATIVE 11/13/2018 2311   Sepsis Labs: @LABRCNTIP (procalcitonin:4,lacticidven:4)  ) Recent Results (from the past 240 hour(s))  Blood culture (routine x 2)     Status: Abnormal (Preliminary result)   Collection Time: 11/13/18 11:00 PM  Result Value Ref Range Status   Specimen Description BLOOD LEFT UPPER ARM  Final   Special Requests   Final    BOTTLES DRAWN AEROBIC AND ANAEROBIC Blood Culture adequate volume   Culture  Setup Time   Final    GRAM POSITIVE COCCI AEROBIC BOTTLE ONLY CRITICAL RESULT CALLED TO, READ BACK BY AND VERIFIED WITHMelven Sartorius: J LEDFORD [facility], TheHARMD [id number] 11/15/18 A BROWNING Performed at [facility], [address] [place], [place] [address]    Culture STAPHYLOCOCCUS SPECIES (COAGULASE NEGATIVE) (A)  Final   Report Status PENDING  Incomplete  Blood Culture ID Panel (Reflexed)     Status: Abnormal   Collection Time: 11/13/18 11:00  PM  Result Value Ref Range Status   Enterococcus species NOT DETECTED NOT DETECTED Final   Listeria monocytogenes NOT DETECTED NOT DETECTED Final   Staphylococcus species DETECTED (A) NOT DETECTED Final    Comment: Methicillin (oxacillin) susceptible coagulase negative staphylococcus. Possible blood culture contaminant (unless isolated from more than one blood culture draw or clinical case suggests pathogenicity). No antibiotic treatment is indicated for blood  culture contaminants. CRITICAL RESULT CALLED TO, READ BACK BY AND VERIFIED WITHMelven Sartorius PHARMD 1610 11/15/18 A BROWNING    Staphylococcus aureus (BCID) NOT DETECTED NOT DETECTED Final   Methicillin resistance NOT DETECTED NOT DETECTED Final   Streptococcus species NOT DETECTED NOT DETECTED Final   Streptococcus agalactiae NOT DETECTED NOT DETECTED Final    Streptococcus pneumoniae NOT DETECTED NOT DETECTED Final   Streptococcus pyogenes NOT DETECTED NOT DETECTED Final   Acinetobacter baumannii NOT DETECTED NOT DETECTED Final   Enterobacteriaceae species NOT DETECTED NOT DETECTED Final   Enterobacter cloacae complex NOT DETECTED NOT DETECTED Final   Escherichia coli NOT DETECTED NOT DETECTED Final   Klebsiella oxytoca NOT DETECTED NOT DETECTED Final   Klebsiella pneumoniae NOT DETECTED NOT DETECTED Final   Proteus species NOT DETECTED NOT DETECTED Final   Serratia marcescens NOT DETECTED NOT DETECTED Final   Haemophilus influenzae NOT DETECTED NOT DETECTED Final   Neisseria meningitidis NOT DETECTED NOT DETECTED Final   Pseudomonas aeruginosa NOT DETECTED NOT DETECTED Final   Candida albicans NOT DETECTED NOT DETECTED Final   Candida glabrata NOT DETECTED NOT DETECTED Final   Candida krusei NOT DETECTED NOT DETECTED Final   Candida parapsilosis NOT DETECTED NOT DETECTED Final   Candida tropicalis NOT DETECTED NOT DETECTED Final    Comment: Performed at [facility], [address], [place], [place] [address]  Blood culture (routine x 2)     Status: None (Preliminary result)   Collection Time: 11/13/18 11:03 PM  Result Value Ref Range Status   Specimen Description BLOOD RIGHT HAND  Final   Special Requests   Final    BOTTLES DRAWN AEROBIC AND ANAEROBIC Blood Culture results may not be optimal due to an excessive volume of blood received in culture bottles   Culture   Final    NO GROWTH 3 DAYS Performed at [facility], [address] [place], [place] [address]    Report Status PENDING  Incomplete      Studies: No results found.  Scheduled Meds: . folic acid  1 mg Oral Daily  . levETIRAcetam  500 mg Oral BID  . multivitamin with minerals  1 tablet Oral Daily  . thiamine  100 mg Oral Daily    Continuous Infusions:    LOS: 0 days     Darlin Drop, MD [facility] Pager [phone]  If 7PM-7AM,  please contact night-coverage [email or web address] Password [email or web address] 11/16/2018, 4:00 PM

## 2018-11-17 ENCOUNTER — Observation Stay (HOSPITAL_BASED_OUTPATIENT_CLINIC_OR_DEPARTMENT_OTHER): Payer: BC Managed Care – PPO

## 2018-11-17 DIAGNOSIS — M79609 Pain in unspecified limb: Secondary | ICD-10-CM | POA: Diagnosis not present

## 2018-11-17 DIAGNOSIS — R569 Unspecified convulsions: Secondary | ICD-10-CM | POA: Diagnosis not present

## 2018-11-17 DIAGNOSIS — M7989 Other specified soft tissue disorders: Secondary | ICD-10-CM

## 2018-11-17 LAB — CBC WITH DIFFERENTIAL/PLATELET
Abs Immature Granulocytes: 0.02 10*3/uL (ref 0.00–0.07)
Basophils Absolute: 0.1 10*3/uL (ref 0.0–0.1)
Basophils Relative: 1 %
Eosinophils Absolute: 0 10*3/uL (ref 0.0–0.5)
Eosinophils Relative: 1 %
HCT: 29.7 % — ABNORMAL LOW (ref 39.0–52.0)
Hemoglobin: 9.1 g/dL — ABNORMAL LOW (ref 13.0–17.0)
Immature Granulocytes: 0 %
LYMPHS ABS: 1.5 10*3/uL (ref 0.7–4.0)
Lymphocytes Relative: 25 %
MCH: 27.7 pg (ref 26.0–34.0)
MCHC: 30.6 g/dL (ref 30.0–36.0)
MCV: 90.5 fL (ref 80.0–100.0)
Monocytes Absolute: 0.9 10*3/uL (ref 0.1–1.0)
Monocytes Relative: 15 %
Neutro Abs: 3.4 10*3/uL (ref 1.7–7.7)
Neutrophils Relative %: 58 %
Platelets: 228 10*3/uL (ref 150–400)
RBC: 3.28 MIL/uL — ABNORMAL LOW (ref 4.22–5.81)
RDW: 15.2 % (ref 11.5–15.5)
WBC: 5.9 10*3/uL (ref 4.0–10.5)
nRBC: 0 % (ref 0.0–0.2)

## 2018-11-17 LAB — GLUCOSE, CAPILLARY
Glucose-Capillary: 108 mg/dL — ABNORMAL HIGH (ref 70–99)
Glucose-Capillary: 79 mg/dL (ref 70–99)
Glucose-Capillary: 91 mg/dL (ref 70–99)
Glucose-Capillary: 108 mg/dL — ABNORMAL HIGH (ref 70–99)

## 2018-11-17 LAB — COMPREHENSIVE METABOLIC PANEL
ALT: 21 U/L (ref 0–44)
AST: 40 U/L (ref 15–41)
Albumin: 2.6 g/dL — ABNORMAL LOW (ref 3.5–5.0)
Alkaline Phosphatase: 60 U/L (ref 38–126)
Anion gap: 8 (ref 5–15)
BUN: 10 mg/dL (ref 8–23)
CO2: 21 mmol/L — ABNORMAL LOW (ref 22–32)
Calcium: 8.9 mg/dL (ref 8.9–10.3)
Chloride: 105 mmol/L (ref 98–111)
Creatinine, Ser: 0.9 mg/dL (ref 0.61–1.24)
GFR calc Af Amer: >60 mL/min (ref >60)
GFR calc non Af Amer: >60 mL/min (ref >60)
Glucose, Bld: 99 mg/dL (ref 70–99)
POTASSIUM: 3.6 mmol/L (ref 3.5–5.1)
Sodium: 134 mmol/L — ABNORMAL LOW (ref 135–145)
Total Bilirubin: 0.5 mg/dL (ref 0.3–1.2)
Total Protein: 6.6 g/dL (ref 6.5–8.1)

## 2018-11-17 LAB — CULTURE, BLOOD (ROUTINE X 2): Special Requests: ADEQUATE

## 2018-11-17 LAB — CK: Total CK: 868 U/L — ABNORMAL HIGH (ref 49–397)

## 2018-11-17 LAB — PHOSPHORUS: Phosphorus: 4.2 mg/dL (ref 2.5–4.6)

## 2018-11-17 LAB — MAGNESIUM: Magnesium: 1.7 mg/dL (ref 1.7–2.4)

## 2018-11-17 MED ORDER — MAGNESIUM SULFATE 2 GM/50ML IV SOLN
2.0000 g | Freq: Once | INTRAVENOUS | Status: AC
  Administered 2018-11-17: 2 g via INTRAVENOUS
  Filled 2018-11-17: qty 50

## 2018-11-17 NOTE — Progress Notes (Signed)
VASCULAR LAB PRELIMINARY  PRELIMINARY  PRELIMINARY  PRELIMINARY  Right upper extremity venous duplex completed.    Preliminary report:  There is no obvious evidence of DVT or SVT noted in the visualized veins of the right upper extremity.   Mason Hart, RVT 11/17/2018, 9:01 AM

## 2018-11-17 NOTE — Progress Notes (Signed)
PROGRESS NOTE  Mason Hart ZOX:[id number] DOB: 25-Apr-1956 DOA: 11/13/2018 PCP: [name], [name], [name]  HPI/Recap of past 24 hours: 62 y.o.malewithknown history of seizures and alcoholism was brought to the ER after patient had shaking spells at home. As per the report, patient's wife called EMS and patient was found to be hypoglycemic with CBG of 60. Patient was given some juice and it improved to 120. Patient on exam had some left-sided weakness. Patient was brought to the ER. MRI unremarkable for any acute intracranial findings. EEG no active seizure activities.  11/15/2018: PT assessed and recommended SNF due to generalized weakness.  11/16/2018: PT assessed and recommended SNF due to generalized weakness.  He is agreeable.  He reports mild pain in his left shoulder after taking his pain medication.  11/17/18: Seen and examined at his bedside. No acute events overnight. No new complaints. RUE duplex US negative for DVT.  Assessment/Plan: Principal Problem:   Seizure (HCC) Active Problems:   Hypoglycemia   Alcohol use   ARF (acute renal failure) (HCC)   Clavicle fracture  Seizures in the setting of seizure disorder Dilantin level low Continue Keppra 500 mg twice daily as recommended by neurology MRI brain and EEG unremarkable for any acute findings Independently reviewed MRI brain done on admission which revealed no acute intracranial findings.  Right upper and right lower extremity weakness, present on admission Negative MRI for any acute findings Neurology suspects Todd's paralysis  Right upper extremity swelling with tenderness Obtain duplex ultrasound of right upper extremity to rule out DVT If negative could be dependent edema Out of bed to chair with every shift  Chronic alcohol abuse with concern for withdrawal Continue CIWA protocol Continue multivitamin thiamine and folate Fall precautions Counseling on alcohol cessation  Left clavicle fracture,  present on admission Secondary to fall CPK elevated Continue IV fluid hydration Repeat CPK level in the morning  Cervical spondylosis without acute cervical fracture Pain management in place as needed  Gram-positive cocci coagulase negative blood culture 1 out of 2 Possibly contaminant Continue to closely monitor cultures Repeat blood cultures x2 peripherally  Ambulatory dysfunction/falls Could be related to chronic alcohol abuse Negative MRI for any acute findings PT assessed and recommended SNF Fall precautions Continue PT  Risks: Patient will require at least 2 midnights due to concern for withdrawal, and generalized weakness requiring SNF for physical rehab.  Patient is high risk for decompensation due to multiple comorbidities and advanced age.   DVT prophylaxis:  Subcu heparin 3 times daily Code Status: Full code. Family Communication: No family at the bedside. Disposition Plan:  SNF when bed is available Consults called: Neurology.   Objective: Vitals:   11/16/18 2200 11/17/18 0506 11/17/18 0800 11/17/18 1200  BP: 106/65 136/83  136/83  Pulse: 78 74  74  Resp: 18 16    Temp: 99.8 F (37.7 C) 99.2 F (37.3 C)    TempSrc: Oral Oral    SpO2: 98% 100% 100%   Weight:      Height:        Intake/Output Summary (Last 24 hours) at 11/17/2018 1327 Last data filed at 11/17/2018 0930 Gross per 24 hour  Intake 340 ml  Output 600 ml  Net -260 ml   Filed Weights   11/13/18 2132 11/14/18 1313  Weight: 96.2 kg 89.1 kg    Exam:  . General: 62 y.o. year-old male well-developed well-nourished in no acute distress.  Alert and interactive. . Cardiovascular: Regular rate and rhythm with no  rubs or gallops.  No JVD or thyromegaly noted. Marland Kitchen Respiratory: Clear to auscultation with no wheezes or rales.  Good inspiratory effort.. . Abdomen: Soft nontender nondistended with normal bowel sounds x4 quadrants. . Musculoskeletal: Right upper extremity edema. 2/4 pulses in all 4  extremities. Marland Kitchen Psychiatry: Mood is appropriate for condition and setting   Data Reviewed: CBC: Recent Labs  Lab 11/13/18 2206 11/14/18 0442 11/17/18 0344  WBC 8.1 5.2 5.9  NEUTROABS 6.8 3.2 3.4  HGB 10.5* 9.0* 9.1*  HCT 34.7* 30.5* 29.7*  MCV 88.5 89.2 90.5  PLT 281 266 228   Basic Metabolic Panel: Recent Labs  Lab 11/13/18 2206 11/14/18 0442 11/17/18 0344  NA 134* 135 134*  K 4.2 4.4 3.6  CL 101 103 105  CO2 20* 20* 21*  GLUCOSE 107* 76 99  BUN 17 18 10   CREATININE 1.76* 1.60* 0.90  CALCIUM 9.7 8.8* 8.9  MG  --   --  1.7  PHOS  --   --  4.2   GFR: Estimated Creatinine Clearance: 93.7 mL/min (by C-G formula based on SCr of 0.9 mg/dL). Liver Function Tests: Recent Labs  Lab 11/13/18 2206 11/14/18 0442 11/17/18 0344  AST 40 53* 40  ALT 22 22 21   ALKPHOS 81 68 60  BILITOT 0.4 0.6 0.5  PROT 8.0 7.0 6.6  ALBUMIN 3.5 3.1* 2.6*   No results for input(s): LIPASE, AMYLASE in the last 168 hours. Recent Labs  Lab 11/13/18 2206  AMMONIA 24   Coagulation Profile: No results for input(s): INR, PROTIME in the last 168 hours. Cardiac Enzymes: Recent Labs  Lab 11/14/18 0442 11/17/18 0344  CKTOTAL 1,181* 868*  TROPONINI 0.03*  --    BNP (last 3 results) No results for input(s): PROBNP in the last 8760 hours. HbA1C: No results for input(s): HGBA1C in the last 72 hours. CBG: Recent Labs  Lab 11/16/18 1134 11/16/18 1618 11/16/18 2044 11/17/18 0804 11/17/18 1213  GLUCAP 97 89 81 108* 79   Lipid Profile: No results for input(s): CHOL, HDL, LDLCALC, TRIG, CHOLHDL, LDLDIRECT in the last 72 hours. Thyroid Function Tests: No results for input(s): TSH, T4TOTAL, FREET4, T3FREE, THYROIDAB in the last 72 hours. Anemia Panel: No results for input(s): VITAMINB12, FOLATE, FERRITIN, TIBC, IRON, RETICCTPCT in the last 72 hours. Urine analysis:    Component Value Date/Time   COLORURINE AMBER (A) 11/13/2018 2311   APPEARANCEUR TURBID (A) 11/13/2018 2311   LABSPEC  1.024 11/13/2018 2311   PHURINE 5.0 11/13/2018 2311   GLUCOSEU NEGATIVE 11/13/2018 2311   HGBUR MODERATE (A) 11/13/2018 2311   BILIRUBINUR SMALL (A) 11/13/2018 2311   KETONESUR 5 (A) 11/13/2018 2311   PROTEINUR 100 (A) 11/13/2018 2311   UROBILINOGEN 1.0 04/04/2012 1449   NITRITE NEGATIVE 11/13/2018 2311   LEUKOCYTESUR NEGATIVE 11/13/2018 2311   Sepsis Labs: @LABRCNTIP (procalcitonin:4,lacticidven:4)  ) Recent Results (from the past 240 hour(s))  Blood culture (routine x 2)     Status: Abnormal   Collection Time: 11/13/18 11:00 PM  Result Value Ref Range Status   Specimen Description BLOOD LEFT UPPER ARM  Final   Special Requests   Final    BOTTLES DRAWN AEROBIC AND ANAEROBIC Blood Culture adequate volume   Culture  Setup Time   Final    GRAM POSITIVE COCCI AEROBIC BOTTLE ONLY CRITICAL RESULT CALLED TO, READ BACK BY AND VERIFIED WITHShela Commons [facility] PHARMD 1610 11/15/18 A BROWNING    Culture (A)  Final    STAPHYLOCOCCUS SPECIES (COAGULASE NEGATIVE) THE SIGNIFICANCE OF  ISOLATING THIS ORGANISM FROM A SINGLE SET OF BLOOD CULTURES WHEN MULTIPLE SETS ARE DRAWN IS UNCERTAIN. PLEASE NOTIFY THE MICROBIOLOGY DEPARTMENT WITHIN ONE WEEK IF SPECIATION AND SENSITIVITIES ARE REQUIRED. Performed at [facility], [address] [place], [place] [address]    Report Status 11/17/2018 FINAL  Final  Blood Culture ID Panel (Reflexed)     Status: Abnormal   Collection Time: 11/13/18 11:00 PM  Result Value Ref Range Status   Enterococcus species NOT DETECTED NOT DETECTED Final   Listeria monocytogenes NOT DETECTED NOT DETECTED Final   Staphylococcus species DETECTED (A) NOT DETECTED Final    Comment: Methicillin (oxacillin) susceptible coagulase negative staphylococcus. Possible blood culture contaminant (unless isolated from more than one blood culture draw or clinical case suggests pathogenicity). No antibiotic treatment is indicated for blood  culture contaminants. CRITICAL RESULT CALLED TO,  READ BACK BY AND VERIFIED WITHMelven Sartorius: J LEDFORD PHARMD [id number] 11/15/18 A BROWNING    Staphylococcus aureus (BCID) NOT DETECTED NOT DETECTED Final   Methicillin resistance NOT DETECTED NOT DETECTED Final   Streptococcus species NOT DETECTED NOT DETECTED Final   Streptococcus agalactiae NOT DETECTED NOT DETECTED Final   Streptococcus pneumoniae NOT DETECTED NOT DETECTED Final   Streptococcus pyogenes NOT DETECTED NOT DETECTED Final   Acinetobacter baumannii NOT DETECTED NOT DETECTED Final   Enterobacteriaceae species NOT DETECTED NOT DETECTED Final   Enterobacter cloacae complex NOT DETECTED NOT DETECTED Final   Escherichia coli NOT DETECTED NOT DETECTED Final   Klebsiella oxytoca NOT DETECTED NOT DETECTED Final   Klebsiella pneumoniae NOT DETECTED NOT DETECTED Final   Proteus species NOT DETECTED NOT DETECTED Final   Serratia marcescens NOT DETECTED NOT DETECTED Final   Haemophilus influenzae NOT DETECTED NOT DETECTED Final   Neisseria meningitidis NOT DETECTED NOT DETECTED Final   Pseudomonas aeruginosa NOT DETECTED NOT DETECTED Final   Candida albicans NOT DETECTED NOT DETECTED Final   Candida glabrata NOT DETECTED NOT DETECTED Final   Candida krusei NOT DETECTED NOT DETECTED Final   Candida parapsilosis NOT DETECTED NOT DETECTED Final   Candida tropicalis NOT DETECTED NOT DETECTED Final    Comment: Performed at [facility], [address] [place], [place] [address]  Blood culture (routine x 2)     Status: None (Preliminary result)   Collection Time: 11/13/18 11:03 PM  Result Value Ref Range Status   Specimen Description BLOOD RIGHT HAND  Final   Special Requests   Final    BOTTLES DRAWN AEROBIC AND ANAEROBIC Blood Culture results may not be optimal due to an excessive volume of blood received in culture bottles   Culture   Final    NO GROWTH 4 DAYS Performed at [facility], [address] [place], [place] [address]    Report Status PENDING  Incomplete  Culture, blood  (routine x 2)     Status: None (Preliminary result)   Collection Time: 11/16/18  4:34 PM  Result Value Ref Range Status   Specimen Description BLOOD BLOOD RIGHT HAND  Final   Special Requests   Final    BOTTLES DRAWN AEROBIC ONLY Blood Culture results may not be optimal due to an inadequate volume of blood received in culture bottles   Culture   Final    NO GROWTH < 24 HOURS Performed at [facility], [address] [place], [place] [address]    Report Status PENDING  Incomplete  Culture, blood (routine x 2)     Status: None (  Preliminary result)   Collection Time: 11/16/18  4:34 PM  Result Value Ref Range Status   Specimen Description BLOOD SITE NOT SPECIFIED  Final   Special Requests   Final    BOTTLES DRAWN AEROBIC ONLY Blood Culture results may not be optimal due to an inadequate volume of blood received in culture bottles   Culture   Final    NO GROWTH < 24 HOURS Performed at [facility], [address] [place], [place] [address]    Report Status PENDING  Incomplete      Studies: Vas [place] Upper Extremity Venous Duplex  Result Date: 11/17/2018 UPPER VENOUS STUDY  Indications: Pain, and Swelling Limitations: Patient unable to move arm away from body or turn arm secondary to pain. Comparison Study: no prior study Performing Technologist: Sherren Kerns RVS  Examination Guidelines: A complete evaluation includes B-mode imaging, spectral Doppler, color Doppler, and power Doppler as needed of all accessible portions of each vessel. Bilateral testing is considered an integral part of a complete examination. Limited examinations for reoccurring indications may be performed as noted.  Right Findings: +----------+------------+----------+---------+-----------+----------+ RIGHT     CompressiblePropertiesPhasicitySpontaneous Summary   +----------+------------+----------+---------+-----------+----------+ IJV           Full                 Yes       Yes                +----------+------------+----------+---------+-----------+----------+ Subclavian                         Yes       Yes               +----------+------------+----------+---------+-----------+----------+ Axillary      Full                 Yes       Yes               +----------+------------+----------+---------+-----------+----------+ Brachial      Full                 Yes       Yes               +----------+------------+----------+---------+-----------+----------+ Radial        Full                                             +----------+------------+----------+---------+-----------+----------+ Ulnar                                               visualized +----------+------------+----------+---------+-----------+----------+ Cephalic      Full                                             +----------+------------+----------+---------+-----------+----------+ Basilic       Full                                             +----------+------------+----------+---------+-----------+----------+  Summary:  Right: No evidence of deep vein thrombosis in the upper extremity. However, unable to visualize the not all segments of the basilic vein. No evidence of superficial vein thrombosis in the upper extremity. However, unable to visualize the not all segments of the basilic vein. No evidence of thrombosis in the . However, unable to visualize the not all segments of the basilic vein. This was a limited study.  *See table(s) above for measurements and observations.  Diagnosing physician: Sherald Hess MD Electronically signed by Sherald Hess MD on 11/17/2018 at 10:43:12 AM.    Final     Scheduled Meds: . folic acid  1 mg Oral Daily  . heparin injection (subcutaneous)  5,000 Units Subcutaneous Q8H  . levETIRAcetam  500 mg Oral BID  . multivitamin with minerals  1 tablet Oral Daily  . thiamine  100 mg Oral Daily    Continuous Infusions: . sodium chloride 75 mL/hr  at 11/17/18 0625     LOS: 0 days     Darlin Drop, MD [facility] Pager [phone]  If 7PM-7AM, please contact night-coverage [email or web address] Password [facility] 11/17/2018, 1:27 PM

## 2018-11-17 NOTE — Progress Notes (Signed)
PROGRESS NOTE  Coralie CarpenJohn Kanady MVH:[id number] DOB: Sep 30, 1956 DOA: 11/13/2018 PCP: [name], [name], [name]  HPI/Recap of past 24 hours: 62 y.o.malewithknown history of seizures and alcoholism was brought to the ER after patient had shaking spells at home. As per the report, patient's wife called EMS and patient was found to be hypoglycemic with CBG of 60. Patient was given some juice and it improved to 120. Patient on exam had some left-sided weakness. Patient was brought to the ER. MRI unremarkable for any acute intracranial findings. EEG no active seizure activities.  11/15/2018: Patient seen and examined at his bedside.  No acute events overnight.  He is alert and interactive.  PT assessed and recommended SNF due to generalized weakness.  11/16/2018: Patient seen and examined at his bedside.  No acute events overnight.  He is alert and interactive.  PT assessed and recommended SNF due to generalized weakness.  He is agreeable.  He reports mild pain in his left shoulder after taking his pain medication.  11/17/18: seen and examined at bedside.  No acute events overnight.  Persistent pain in his right arm.  Duplex ultrasound negative for DVT.  Assessment/Plan: Principal Problem:   Seizure (HCC) Active Problems:   Hypoglycemia   Alcohol use   ARF (acute renal failure) (HCC)   Clavicle fracture  Seizures in the setting of seizure disorder Dilantin level low Continue Keppra 500 mg twice daily as recommended by neurology MRI brain and EEG unremarkable for any acute findings Independently reviewed MRI brain done on admission No recurrence reported  Right upper and right lower extremity weakness, present on admission Negative MRI for any acute findings Neurology suspects Todd's paralysis  Right upper extremity swelling with tenderness Duplex ultrasound of right upper extremity negative for DVT Could be dependent edema Out of bed to chair with every shift  Chronic alcohol  abuse with concern for withdrawal Continue CIWA protocol Continue multivitamin thiamine and folate Fall precautions Counseling on alcohol cessation  Left clavicle fracture, present on admission Secondary to fall Presented with elevated CPK 1100 which is trending down to 860 Continue IV fluid hydration  Cervical spondylosis without acute cervical fracture Pain management in place as needed  Gram-positive cocci coagulase negative blood culture 1 out of 2 Possibly contaminant Continue to closely monitor cultures Repeat blood cultures x2 peripherally  Ambulatory dysfunction/falls Could be related to chronic alcohol abuse Negative MRI for any acute findings PT assessed and recommended SNF Fall precautions Continue PT  Risks: Patient will require at least 2 midnights due to concern for withdrawal, and generalized weakness requiring SNF for physical rehab.  Patient is high risk for decompensation due to multiple comorbidities and advanced age.   DVT prophylaxis:  Subcu heparin 3 times daily Code Status: Full code. Family Communication: No family at the bedside. Disposition Plan:  SNF when bed is available Consults called: Neurology.   Objective: Vitals:   11/17/18 0506 11/17/18 0800 11/17/18 1200 11/17/18 1423  BP: 136/83  136/83 119/66  Pulse: 74  74 77  Resp: 16   20  Temp: 99.2 F (37.3 C)   98.9 F (37.2 C)  TempSrc: Oral     SpO2: 100% 100%  100%  Weight:      Height:        Intake/Output Summary (Last 24 hours) at 11/17/2018 1612 Last data filed at 11/17/2018 1300 Gross per 24 hour  Intake 240 ml  Output 600 ml  Net -360 ml   Filed Weights   11/13/18  2132 11/14/18 1313  Weight: 96.2 kg 89.1 kg    Exam:  . General: 62 y.o. year-old male WD WN NAD A&O x3 . Cardiovascular: RRR no murmurs or gallops. No JVD or thyromegaly. Marland Kitchen. Respiratory: CTA no rales or wheezes. Poor inspiratory efforts. . Abdomen: Soft nontender nondistended with normal bowel sounds x4  quadrants. . Musculoskeletal: Right upper extremity edema. 2/4 pulses in all 4 extremities. Marland Kitchen. Psychiatry: Mood is appropriate for condition and setting   Data Reviewed: CBC: Recent Labs  Lab 11/13/18 2206 11/14/18 0442 11/17/18 0344  WBC 8.1 5.2 5.9  NEUTROABS 6.8 3.2 3.4  HGB 10.5* 9.0* 9.1*  HCT 34.7* 30.5* 29.7*  MCV 88.5 89.2 90.5  PLT 281 266 228   Basic Metabolic Panel: Recent Labs  Lab 11/13/18 2206 11/14/18 0442 11/17/18 0344  NA 134* 135 134*  K 4.2 4.4 3.6  CL 101 103 105  CO2 20* 20* 21*  GLUCOSE 107* 76 99  BUN 17 18 10   CREATININE 1.76* 1.60* 0.90  CALCIUM 9.7 8.8* 8.9  MG  --   --  1.7  PHOS  --   --  4.2   GFR: Estimated Creatinine Clearance: 93.7 mL/min (by C-G formula based on SCr of 0.9 mg/dL). Liver Function Tests: Recent Labs  Lab 11/13/18 2206 11/14/18 0442 11/17/18 0344  AST 40 53* 40  ALT 22 22 21   ALKPHOS 81 68 60  BILITOT 0.4 0.6 0.5  PROT 8.0 7.0 6.6  ALBUMIN 3.5 3.1* 2.6*   No results for input(s): LIPASE, AMYLASE in the last 168 hours. Recent Labs  Lab 11/13/18 2206  AMMONIA 24   Coagulation Profile: No results for input(s): INR, PROTIME in the last 168 hours. Cardiac Enzymes: Recent Labs  Lab 11/14/18 0442 11/17/18 0344  CKTOTAL 1,181* 868*  TROPONINI 0.03*  --    BNP (last 3 results) No results for input(s): PROBNP in the last 8760 hours. HbA1C: No results for input(s): HGBA1C in the last 72 hours. CBG: Recent Labs  Lab 11/16/18 1134 11/16/18 1618 11/16/18 2044 11/17/18 0804 11/17/18 1213  GLUCAP 97 89 81 108* 79   Lipid Profile: No results for input(s): CHOL, HDL, LDLCALC, TRIG, CHOLHDL, LDLDIRECT in the last 72 hours. Thyroid Function Tests: No results for input(s): TSH, T4TOTAL, FREET4, T3FREE, THYROIDAB in the last 72 hours. Anemia Panel: No results for input(s): VITAMINB12, FOLATE, FERRITIN, TIBC, IRON, RETICCTPCT in the last 72 hours. Urine analysis:    Component Value Date/Time   COLORURINE  AMBER (A) 11/13/2018 2311   APPEARANCEUR TURBID (A) 11/13/2018 2311   LABSPEC 1.024 11/13/2018 2311   PHURINE 5.0 11/13/2018 2311   GLUCOSEU NEGATIVE 11/13/2018 2311   HGBUR MODERATE (A) 11/13/2018 2311   BILIRUBINUR SMALL (A) 11/13/2018 2311   KETONESUR 5 (A) 11/13/2018 2311   PROTEINUR 100 (A) 11/13/2018 2311   UROBILINOGEN 1.0 04/04/2012 1449   NITRITE NEGATIVE 11/13/2018 2311   LEUKOCYTESUR NEGATIVE 11/13/2018 2311   Sepsis Labs: @LABRCNTIP (procalcitonin:4,lacticidven:4)  ) Recent Results (from the past 240 hour(s))  Blood culture (routine x 2)     Status: Abnormal   Collection Time: 11/13/18 11:00 PM  Result Value Ref Range Status   Specimen Description BLOOD LEFT UPPER ARM  Final   Special Requests   Final    BOTTLES DRAWN AEROBIC AND ANAEROBIC Blood Culture adequate volume   Culture  Setup Time   Final    GRAM POSITIVE COCCI AEROBIC BOTTLE ONLY CRITICAL RESULT CALLED TO, READ BACK BY AND VERIFIED WITH: J  [facility] PHARMD 1610 11/15/18 A BROWNING    Culture (A)  Final    STAPHYLOCOCCUS SPECIES (COAGULASE NEGATIVE) THE SIGNIFICANCE OF ISOLATING THIS ORGANISM FROM A SINGLE SET OF BLOOD CULTURES WHEN MULTIPLE SETS ARE DRAWN IS UNCERTAIN. PLEASE NOTIFY THE MICROBIOLOGY DEPARTMENT WITHIN ONE WEEK IF SPECIATION AND SENSITIVITIES ARE REQUIRED. Performed at [facility], [address], [place], [place] [address]    Report Status 11/17/2018 FINAL  Final  Blood Culture ID Panel (Reflexed)     Status: Abnormal   Collection Time: 11/13/18 11:00 PM  Result Value Ref Range Status   Enterococcus species NOT DETECTED NOT DETECTED Final   Listeria monocytogenes NOT DETECTED NOT DETECTED Final   Staphylococcus species DETECTED (A) NOT DETECTED Final    Comment: Methicillin (oxacillin) susceptible coagulase negative staphylococcus. Possible blood culture contaminant (unless isolated from more than one blood culture draw or clinical case suggests pathogenicity). No antibiotic  treatment is indicated for blood  culture contaminants. CRITICAL RESULT CALLED TO, READ BACK BY AND VERIFIED WITHMelven Sartorius PHARMD 4098 11/15/18 A BROWNING    Staphylococcus aureus (BCID) NOT DETECTED NOT DETECTED Final   Methicillin resistance NOT DETECTED NOT DETECTED Final   Streptococcus species NOT DETECTED NOT DETECTED Final   Streptococcus agalactiae NOT DETECTED NOT DETECTED Final   Streptococcus pneumoniae NOT DETECTED NOT DETECTED Final   Streptococcus pyogenes NOT DETECTED NOT DETECTED Final   Acinetobacter baumannii NOT DETECTED NOT DETECTED Final   Enterobacteriaceae species NOT DETECTED NOT DETECTED Final   Enterobacter cloacae complex NOT DETECTED NOT DETECTED Final   Escherichia coli NOT DETECTED NOT DETECTED Final   Klebsiella oxytoca NOT DETECTED NOT DETECTED Final   Klebsiella pneumoniae NOT DETECTED NOT DETECTED Final   Proteus species NOT DETECTED NOT DETECTED Final   Serratia marcescens NOT DETECTED NOT DETECTED Final   Haemophilus influenzae NOT DETECTED NOT DETECTED Final   Neisseria meningitidis NOT DETECTED NOT DETECTED Final   Pseudomonas aeruginosa NOT DETECTED NOT DETECTED Final   Candida albicans NOT DETECTED NOT DETECTED Final   Candida glabrata NOT DETECTED NOT DETECTED Final   Candida krusei NOT DETECTED NOT DETECTED Final   Candida parapsilosis NOT DETECTED NOT DETECTED Final   Candida tropicalis NOT DETECTED NOT DETECTED Final    Comment: Performed at [facility], [address] [place], [place] [address]  Blood culture (routine x 2)     Status: None (Preliminary result)   Collection Time: 11/13/18 11:03 PM  Result Value Ref Range Status   Specimen Description BLOOD RIGHT HAND  Final   Special Requests   Final    BOTTLES DRAWN AEROBIC AND ANAEROBIC Blood Culture results may not be optimal due to an excessive volume of blood received in culture bottles   Culture   Final    NO GROWTH 4 DAYS Performed at [facility], [address], [place], [place] [address]    Report Status PENDING  Incomplete  Culture, blood (routine x 2)     Status: None (Preliminary result)   Collection Time: 11/16/18  4:34 PM  Result Value Ref Range Status   Specimen Description BLOOD BLOOD RIGHT HAND  Final   Special Requests   Final    BOTTLES DRAWN AEROBIC ONLY Blood Culture results may not be optimal due to an inadequate volume of blood received in culture bottles   Culture   Final    NO GROWTH < 24 HOURS Performed at [facility], [address],  [place], [place] [address]    Report Status PENDING  Incomplete  Culture, blood (routine x 2)     Status: None (Preliminary result)   Collection Time: 11/16/18  4:34 PM  Result Value Ref Range Status   Specimen Description BLOOD SITE NOT SPECIFIED  Final   Special Requests   Final    BOTTLES DRAWN AEROBIC ONLY Blood Culture results may not be optimal due to an inadequate volume of blood received in culture bottles   Culture   Final    NO GROWTH < 24 HOURS Performed at [facility], [address], [place], [place] [address]    Report Status PENDING  Incomplete      Studies: Vas [place] Upper Extremity Venous Duplex  Result Date: 11/17/2018 UPPER VENOUS STUDY  Indications: Pain, and Swelling Limitations: Patient unable to move arm away from body or turn arm secondary to pain. Comparison Study: no prior study Performing Technologist: Sherren Kerns RVS  Examination Guidelines: A complete evaluation includes B-mode imaging, spectral Doppler, color Doppler, and power Doppler as needed of all accessible portions of each vessel. Bilateral testing is considered an integral part of a complete examination. Limited examinations for reoccurring indications may be performed as noted.  Right Findings: +----------+------------+----------+---------+-----------+----------+ RIGHT     CompressiblePropertiesPhasicitySpontaneous Summary    +----------+------------+----------+---------+-----------+----------+ IJV           Full                 Yes       Yes               +----------+------------+----------+---------+-----------+----------+ Subclavian                         Yes       Yes               +----------+------------+----------+---------+-----------+----------+ Axillary      Full                 Yes       Yes               +----------+------------+----------+---------+-----------+----------+ Brachial      Full                 Yes       Yes               +----------+------------+----------+---------+-----------+----------+ Radial        Full                                             +----------+------------+----------+---------+-----------+----------+ Ulnar                                               visualized +----------+------------+----------+---------+-----------+----------+ Cephalic      Full                                             +----------+------------+----------+---------+-----------+----------+ Basilic       Full                                             +----------+------------+----------+---------+-----------+----------+  Summary:  Right: No evidence of deep vein thrombosis in the upper extremity. However, unable to visualize the not all segments of the basilic vein. No evidence of superficial vein thrombosis in the upper extremity. However, unable to visualize the not all segments of the basilic vein. No evidence of thrombosis in the . However, unable to visualize the not all segments of the basilic vein. This was a limited study.  *See table(s) above for measurements and observations.  Diagnosing physician: Sherald Hess MD Electronically signed by Sherald Hess MD on 11/17/2018 at 10:43:12 AM.    Final     Scheduled Meds: . folic acid  1 mg Oral Daily  . heparin injection (subcutaneous)  5,000 Units Subcutaneous Q8H  . levETIRAcetam  500 mg Oral BID   . multivitamin with minerals  1 tablet Oral Daily  . thiamine  100 mg Oral Daily    Continuous Infusions: . sodium chloride 75 mL/hr at 11/17/18 0625     LOS: 0 days     Darlin Drop, MD [facility] Pager [phone]  If 7PM-7AM, please contact night-coverage [email or web address] Password [facility] 11/17/2018, 4:12 PM

## 2018-11-17 NOTE — Progress Notes (Signed)
Acknowledge consult to see if patient can be moved to inpatient. Message sent to [facility] nurse, will update note with response.

## 2018-11-18 DIAGNOSIS — R569 Unspecified convulsions: Secondary | ICD-10-CM | POA: Diagnosis not present

## 2018-11-18 LAB — GLUCOSE, CAPILLARY
GLUCOSE-CAPILLARY: 83 mg/dL (ref 70–99)
GLUCOSE-CAPILLARY: 89 mg/dL (ref 70–99)
GLUCOSE-CAPILLARY: 87 mg/dL (ref 70–99)
Glucose-Capillary: 72 mg/dL (ref 70–99)
Glucose-Capillary: 81 mg/dL (ref 70–99)
Glucose-Capillary: 98 mg/dL (ref 70–99)

## 2018-11-18 LAB — CULTURE, BLOOD (ROUTINE X 2): Culture: NO GROWTH

## 2018-11-18 MED ORDER — MAGNESIUM SULFATE 2 GM/50ML IV SOLN
2.0000 g | Freq: Once | INTRAVENOUS | Status: AC
  Administered 2018-11-18: 2 g via INTRAVENOUS
  Filled 2018-11-18: qty 50

## 2018-11-18 NOTE — Progress Notes (Signed)
CSW left voicemail for patient's son regarding discharge planning.   Osborne Cascoadia Janelle Culton LCSW [phone]

## 2018-11-18 NOTE — Progress Notes (Signed)
PROGRESS NOTE  Mason Hart ONG:[id number] DOB: 05/11/1956 DOA: 11/13/2018 PCP: [name], [name], [name]  HPI/Recap of past 24 hours: 62 y.o.malewithknown history of seizures and alcoholism was brought to the ER after patient had shaking spells at home. As per the report, patient's wife called EMS and patient was found to be hypoglycemic with CBG of 60. Patient was given some juice and it improved to 120. Patient on exam had some left-sided weakness. Patient was brought to the ER. MRI unremarkable for any acute intracranial findings. EEG no active seizure activities.  Hospital course complicated by generalized weakness..  PT assessed and recommended SNF.  Also pain in his right upper extremity.  Duplex ultrasound done and negative for DVT.  11/18/2018: Patient seen and examined at bedside.  No acute events overnight.  Intermittent pain affecting his right upper extremity improved with oral pain medications.  Movement in his right upper extremity improving.  Assessment/Plan: Principal Problem:   Seizure (HCC) Active Problems:   Hypoglycemia   Alcohol use   ARF (acute renal failure) (HCC)   Clavicle fracture  Seizures in the setting of seizure disorder Dilantin level low Continue Keppra 500 mg twice daily as recommended by neurology MRI brain and EEG unremarkable for any acute findings Independently reviewed MRI brain done on admission No recurrence reported  Right upper and right lower extremity weakness, present on admission Negative MRI for any acute findings Neurology suspects Todd's paralysis  Right upper extremity swelling with tenderness, improving Duplex ultrasound of right upper extremity negative for DVT Could be dependent edema Out of bed to chair with every shift  Chronic alcohol abuse with concern for withdrawal Continue CIWA protocol Continue multivitamin thiamine and folate Fall precautions Counseling on alcohol cessation  Left clavicle fracture,  present on admission Secondary to fall Presented with elevated CPK 1100 which is trending down to 860 Continue IV fluid hydration  Cervical spondylosis without acute cervical fracture Pain management in place as needed  Gram-positive cocci coagulase negative blood culture 1 out of 2 Possibly contaminant Continue to closely monitor cultures Repeat blood cultures x2 peripherally  Ambulatory dysfunction/falls Could be related to chronic alcohol abuse Negative MRI for any acute findings PT assessed and recommended SNF Fall precautions Continue PT  Risks: Patient will require at least 2 midnights due to concern for withdrawal, and generalized weakness requiring SNF for physical rehab.  Patient is high risk for decompensation due to multiple comorbidities and advanced age.   DVT prophylaxis:  Subcu heparin 3 times daily Code Status: Full code. Family Communication: No family at the bedside. Disposition Plan:  SNF versus home health PT possibly tomorrow 11/19/2018 Consults called: Neurology.   Objective: Vitals:   11/17/18 1423 11/18/18 0011 11/18/18 0522 11/18/18 1347  BP: 119/66 122/73 135/87 123/68  Pulse: 77 78 75 71  Resp: 20 16 12 20   Temp: 98.9 F (37.2 C) 98.2 F (36.8 C) 98.3 F (36.8 C) 99.4 F (37.4 C)  TempSrc:  Oral Oral   SpO2: 100% 100% 97% 100%  Weight:      Height:        Intake/Output Summary (Last 24 hours) at 11/18/2018 1712 Last data filed at 11/18/2018 0900 Gross per 24 hour  Intake 300 ml  Output 1000 ml  Net -700 ml   Filed Weights   11/13/18 2132 11/14/18 1313  Weight: 96.2 kg 89.1 kg    Exam:  . General: 62 y.o. year-old male well-developed well-nourished in no acute distress.  Alert and interactive. .Marland Kitchen  Cardiovascular: Regular rate and rhythm with no rubs or gallops.  No JVD or thyromegaly noted.  Respiratory: Clear to auscultation with no rales or wheezes.  Poor inspiratory effort. . Abdomen: Soft nontender nondistended with normal  bowel sounds x4 quadrants. . Musculoskeletal: Mild and improving right upper extremity edema. 2/4 pulses in all 4 extremities. Marland Kitchen. Psychiatry: Mood is appropriate for condition and setting   Data Reviewed: CBC: Recent Labs  Lab 11/13/18 2206 11/14/18 0442 11/17/18 0344  WBC 8.1 5.2 5.9  NEUTROABS 6.8 3.2 3.4  HGB 10.5* 9.0* 9.1*  HCT 34.7* 30.5* 29.7*  MCV 88.5 89.2 90.5  PLT 281 266 228   Basic Metabolic Panel: Recent Labs  Lab 11/13/18 2206 11/14/18 0442 11/17/18 0344  NA 134* 135 134*  K 4.2 4.4 3.6  CL 101 103 105  CO2 20* 20* 21*  GLUCOSE 107* 76 99  BUN 17 18 10   CREATININE 1.76* 1.60* 0.90  CALCIUM 9.7 8.8* 8.9  MG  --   --  1.7  PHOS  --   --  4.2   GFR: Estimated Creatinine Clearance: 93.7 mL/min (by C-G formula based on SCr of 0.9 mg/dL). Liver Function Tests: Recent Labs  Lab 11/13/18 2206 11/14/18 0442 11/17/18 0344  AST 40 53* 40  ALT 22 22 21   ALKPHOS 81 68 60  BILITOT 0.4 0.6 0.5  PROT 8.0 7.0 6.6  ALBUMIN 3.5 3.1* 2.6*   No results for input(s): LIPASE, AMYLASE in the last 168 hours. Recent Labs  Lab 11/13/18 2206  AMMONIA 24   Coagulation Profile: No results for input(s): INR, PROTIME in the last 168 hours. Cardiac Enzymes: Recent Labs  Lab 11/14/18 0442 11/17/18 0344  CKTOTAL 1,181* 868*  TROPONINI 0.03*  --    BNP (last 3 results) No results for input(s): PROBNP in the last 8760 hours. HbA1C: No results for input(s): HGBA1C in the last 72 hours. CBG: Recent Labs  Lab 11/18/18 0009 11/18/18 0521 11/18/18 0806 11/18/18 1204 11/18/18 1655  GLUCAP 98 72 83 89 87   Lipid Profile: No results for input(s): CHOL, HDL, LDLCALC, TRIG, CHOLHDL, LDLDIRECT in the last 72 hours. Thyroid Function Tests: No results for input(s): TSH, T4TOTAL, FREET4, T3FREE, THYROIDAB in the last 72 hours. Anemia Panel: No results for input(s): VITAMINB12, FOLATE, FERRITIN, TIBC, IRON, RETICCTPCT in the last 72 hours. Urine analysis:      Component Value Date/Time   COLORURINE AMBER (A) 11/13/2018 2311   APPEARANCEUR TURBID (A) 11/13/2018 2311   LABSPEC 1.024 11/13/2018 2311   PHURINE 5.0 11/13/2018 2311   GLUCOSEU NEGATIVE 11/13/2018 2311   HGBUR MODERATE (A) 11/13/2018 2311   BILIRUBINUR SMALL (A) 11/13/2018 2311   KETONESUR 5 (A) 11/13/2018 2311   PROTEINUR 100 (A) 11/13/2018 2311   UROBILINOGEN 1.0 04/04/2012 1449   NITRITE NEGATIVE 11/13/2018 2311   LEUKOCYTESUR NEGATIVE 11/13/2018 2311   Sepsis Labs: @LABRCNTIP (procalcitonin:4,lacticidven:4)  ) Recent Results (from the past 240 hour(s))  Blood culture (routine x 2)     Status: Abnormal   Collection Time: 11/13/18 11:00 PM  Result Value Ref Range Status   Specimen Description BLOOD LEFT UPPER ARM  Final   Special Requests   Final    BOTTLES DRAWN AEROBIC AND ANAEROBIC Blood Culture adequate volume   Culture  Setup Time   Final    GRAM POSITIVE COCCI AEROBIC BOTTLE ONLY CRITICAL RESULT CALLED TO, READ BACK BY AND VERIFIED WITHShela Commons: [facility] [id number] 11/15/18 A BROWNING    Culture (A)  Final    STAPHYLOCOCCUS SPECIES (COAGULASE NEGATIVE) THE SIGNIFICANCE OF ISOLATING THIS ORGANISM FROM A SINGLE SET OF BLOOD CULTURES WHEN MULTIPLE SETS ARE DRAWN IS UNCERTAIN. PLEASE NOTIFY THE MICROBIOLOGY DEPARTMENT WITHIN ONE WEEK IF SPECIATION AND SENSITIVITIES ARE REQUIRED. Performed at [facility], [address] [place], [place] [address]    Report Status 11/17/2018 FINAL  Final  Blood Culture ID Panel (Reflexed)     Status: Abnormal   Collection Time: 11/13/18 11:00 PM  Result Value Ref Range Status   Enterococcus species NOT DETECTED NOT DETECTED Final   Listeria monocytogenes NOT DETECTED NOT DETECTED Final   Staphylococcus species DETECTED (A) NOT DETECTED Final    Comment: Methicillin (oxacillin) susceptible coagulase negative staphylococcus. Possible blood culture contaminant (unless isolated from more than one blood culture draw or clinical case  suggests pathogenicity). No antibiotic treatment is indicated for blood  culture contaminants. CRITICAL RESULT CALLED TO, READ BACK BY AND VERIFIED WITHMelven Sartorius PHARMD [id number] 11/15/18 A BROWNING    Staphylococcus aureus (BCID) NOT DETECTED NOT DETECTED Final   Methicillin resistance NOT DETECTED NOT DETECTED Final   Streptococcus species NOT DETECTED NOT DETECTED Final   Streptococcus agalactiae NOT DETECTED NOT DETECTED Final   Streptococcus pneumoniae NOT DETECTED NOT DETECTED Final   Streptococcus pyogenes NOT DETECTED NOT DETECTED Final   Acinetobacter baumannii NOT DETECTED NOT DETECTED Final   Enterobacteriaceae species NOT DETECTED NOT DETECTED Final   Enterobacter cloacae complex NOT DETECTED NOT DETECTED Final   Escherichia coli NOT DETECTED NOT DETECTED Final   Klebsiella oxytoca NOT DETECTED NOT DETECTED Final   Klebsiella pneumoniae NOT DETECTED NOT DETECTED Final   Proteus species NOT DETECTED NOT DETECTED Final   Serratia marcescens NOT DETECTED NOT DETECTED Final   Haemophilus influenzae NOT DETECTED NOT DETECTED Final   Neisseria meningitidis NOT DETECTED NOT DETECTED Final   Pseudomonas aeruginosa NOT DETECTED NOT DETECTED Final   Candida albicans NOT DETECTED NOT DETECTED Final   Candida glabrata NOT DETECTED NOT DETECTED Final   Candida krusei NOT DETECTED NOT DETECTED Final   Candida parapsilosis NOT DETECTED NOT DETECTED Final   Candida tropicalis NOT DETECTED NOT DETECTED Final    Comment: Performed at [facility], [address] [place], [place] [address]  Blood culture (routine x 2)     Status: None   Collection Time: 11/13/18 11:03 PM  Result Value Ref Range Status   Specimen Description BLOOD RIGHT HAND  Final   Special Requests   Final    BOTTLES DRAWN AEROBIC AND ANAEROBIC Blood Culture results may not be optimal due to an excessive volume of blood received in culture bottles   Culture   Final    NO GROWTH 5 DAYS Performed at [facility], [address], [place], [place] [address]    Report Status 11/18/2018 FINAL  Final  Culture, blood (routine x 2)     Status: None (Preliminary result)   Collection Time: 11/16/18  4:34 PM  Result Value Ref Range Status   Specimen Description BLOOD RIGHT HAND  Final   Special Requests   Final    BOTTLES DRAWN AEROBIC ONLY Blood Culture results may not be optimal due to an inadequate volume of blood received in culture bottles   Culture   Final    NO GROWTH 2 DAYS Performed at [facility], [address], [place], [place] [address]    Report Status PENDING  Incomplete  Culture, blood (routine  x 2)     Status: None (Preliminary result)   Collection Time: 11/16/18  4:34 PM  Result Value Ref Range Status   Specimen Description BLOOD SITE NOT SPECIFIED  Final   Special Requests   Final    BOTTLES DRAWN AEROBIC ONLY Blood Culture results may not be optimal due to an inadequate volume of blood received in culture bottles   Culture   Final    NO GROWTH 2 DAYS Performed at [facility], [address] [place], [place] [address]    Report Status PENDING  Incomplete      Studies: No results found.  Scheduled Meds: . folic acid  1 mg Oral Daily  . heparin injection (subcutaneous)  5,000 Units Subcutaneous Q8H  . levETIRAcetam  500 mg Oral BID  . multivitamin with minerals  1 tablet Oral Daily  . thiamine  100 mg Oral Daily    Continuous Infusions: . sodium chloride 900 mL (11/18/18 0745)     LOS: 0 days     Darlin Drop, MD [facility] Pager [phone]  If 7PM-7AM, please contact night-coverage [email or web address] Password [facility] 11/18/2018, 5:12 PM

## 2018-11-18 NOTE — Progress Notes (Signed)
CSW received consult regarding PT recommendation of SNF at discharge.  Patient is refusing SNF. CSW has discussed the patient's current level of function related to mobility with the patient and son. They acknowledge understanding of this and feel patient's needs can be met at home. Patient requests to discharge home with home health services. He states that his girlfriend is in and out of the house during the day.    CSW signing off.   Percell Locus Terren Jandreau LCSW [phone]

## 2018-11-18 NOTE — NC FL2 (Signed)
[place] MEDICAID FL2 LEVEL OF CARE SCREENING TOOL     IDENTIFICATION  Patient Name: Mason Hart Birthdate: 1956-06-18 Sex: male Admission Date (Current Location): 11/13/2018  [facility] and [place] Number:  Producer, television/film/video and Address:  [facility], [address], [place], [place] [address]      Provider Number: [id number]  Attending Physician Name and Address:  Darlin Drop, DO  Relative Name and Phone Number:  Bonita Quin, spouse, [phone]    Current Level of Care: Hospital Recommended Level of Care: Skilled Nursing Facility Prior Approval Number:    Date Approved/Denied:   PASRR Number: [id number]  Discharge Plan: SNF    Current Diagnoses: Patient Active Problem List   Diagnosis Date Noted  . ARF (acute renal failure) (HCC) 11/14/2018  . Clavicle fracture 11/14/2018  . Hypokalemia 06/24/2017  . Hypoglycemia 06/24/2017  . Alcohol use 06/24/2017  . Seizure (HCC) 06/24/2017  . Noncompliance with medication regimen   . Seizures (HCC)     Orientation RESPIRATION BLADDER Height & Weight     Self, Place  Normal Continent, External catheter Weight: 89.1 kg Height:  6\' 1"  (185.4 cm)  BEHAVIORAL SYMPTOMS/MOOD NEUROLOGICAL BOWEL NUTRITION STATUS    Convulsions/Seizures Continent Diet(Please see DC Summary)  AMBULATORY STATUS COMMUNICATION OF NEEDS Skin   Limited Assist Verbally PU Stage and Appropriate Care(Stage II on sacrum)                       Personal Care Assistance Level of Assistance  Bathing, Feeding, Dressing Bathing Assistance: Limited assistance Feeding assistance: Independent Dressing Assistance: Limited assistance     Functional Limitations Info  Sight, Hearing, Speech Sight Info: Adequate Hearing Info: Adequate Speech Info: Adequate    SPECIAL CARE FACTORS FREQUENCY  PT (By licensed PT), OT (By licensed OT)     PT Frequency: 5x/week OT Frequency: 3x/week            Contractures Contractures  Info: Not present    Additional Factors Info  Code Status, Allergies Code Status Info: Full Allergies Info: NKA           Current Medications (11/18/2018):  This is the current hospital active medication list Current Facility-Administered Medications  Medication Dose Route Frequency Provider Last Rate Last Dose  . 0.45 % sodium chloride infusion   Intravenous Continuous Darlin Drop, DO 75 mL/hr at 11/18/18 0745 900 mL at 11/18/18 0745  . acetaminophen (TYLENOL) tablet 650 mg  650 mg Oral Q6H PRN Eduard Clos, MD   650 mg at 11/16/18 2113   Or  . acetaminophen (TYLENOL) suppository 650 mg  650 mg Rectal Q6H PRN Eduard Clos, MD      . folic acid (FOLVITE) tablet 1 mg  1 mg Oral Daily Eduard Clos, MD   1 mg at 11/18/18 1032  . heparin injection 5,000 Units  5,000 Units Subcutaneous [address], DO   5,000 Units at 11/17/18 1441  . levETIRAcetam (KEPPRA) tablet 500 mg  500 mg Oral BID Dow Adolph N, DO   500 mg at 11/18/18 1031  . multivitamin with minerals tablet 1 tablet  1 tablet Oral Daily Eduard Clos, MD   1 tablet at 11/18/18 1031  . ondansetron (ZOFRAN) tablet 4 mg  4 mg Oral Q6H PRN Eduard Clos, MD       Or  . ondansetron [facility]) injection 4 mg  4 mg Intravenous Q6H PRN Midge Minium  N, MD      . oxyCODONE (Oxy IR/ROXICODONE) immediate release tablet 5 mg  5 mg Oral Q6H PRN Dow Adolph N, DO      . thiamine (VITAMIN B-1) tablet 100 mg  100 mg Oral Daily Eduard Clos, MD   100 mg at 11/18/18 1032     Discharge Medications: Please see discharge summary for a list of discharge medications.  Relevant Imaging Results:  Relevant Lab Results:   Additional Information SSn: [id number] [place], [place]

## 2018-11-19 DIAGNOSIS — T796XXA Traumatic ischemia of muscle, initial encounter: Secondary | ICD-10-CM | POA: Diagnosis not present

## 2018-11-19 DIAGNOSIS — N179 Acute kidney failure, unspecified: Secondary | ICD-10-CM | POA: Diagnosis not present

## 2018-11-19 DIAGNOSIS — Z7289 Other problems related to lifestyle: Secondary | ICD-10-CM | POA: Diagnosis not present

## 2018-11-19 DIAGNOSIS — R569 Unspecified convulsions: Secondary | ICD-10-CM | POA: Diagnosis not present

## 2018-11-19 LAB — BASIC METABOLIC PANEL
Anion gap: 10 (ref 5–15)
BUN: 9 mg/dL (ref 8–23)
CO2: 24 mmol/L (ref 22–32)
Calcium: 8.7 mg/dL — ABNORMAL LOW (ref 8.9–10.3)
Chloride: 99 mmol/L (ref 98–111)
Creatinine, Ser: 0.77 mg/dL (ref 0.61–1.24)
GFR calc Af Amer: >60 mL/min (ref >60)
GFR calc non Af Amer: >60 mL/min (ref >60)
GLUCOSE: 87 mg/dL (ref 70–99)
Potassium: 3.2 mmol/L — ABNORMAL LOW (ref 3.5–5.1)
Sodium: 133 mmol/L — ABNORMAL LOW (ref 135–145)

## 2018-11-19 LAB — GLUCOSE, CAPILLARY
GLUCOSE-CAPILLARY: 69 mg/dL — AB (ref 70–99)
Glucose-Capillary: 82 mg/dL (ref 70–99)
Glucose-Capillary: 82 mg/dL (ref 70–99)
Glucose-Capillary: 94 mg/dL (ref 70–99)

## 2018-11-19 LAB — CBC
HCT: 29.1 % — ABNORMAL LOW (ref 39.0–52.0)
Hemoglobin: 9 g/dL — ABNORMAL LOW (ref 13.0–17.0)
MCH: 27.4 pg (ref 26.0–34.0)
MCHC: 30.9 g/dL (ref 30.0–36.0)
MCV: 88.7 fL (ref 80.0–100.0)
NRBC: 0 % (ref 0.0–0.2)
Platelets: 252 10*3/uL (ref 150–400)
RBC: 3.28 MIL/uL — ABNORMAL LOW (ref 4.22–5.81)
RDW: 15 % (ref 11.5–15.5)
WBC: 4.9 10*3/uL (ref 4.0–10.5)

## 2018-11-19 LAB — CK: Total CK: 267 U/L (ref 49–397)

## 2018-11-19 LAB — MAGNESIUM: Magnesium: 1.9 mg/dL (ref 1.7–2.4)

## 2018-11-19 MED ORDER — ADULT MULTIVITAMIN W/MINERALS CH: 1.0000 | ORAL_TABLET | Freq: Every day | ORAL | Status: DC

## 2018-11-19 MED ORDER — OXYCODONE HCL 5 MG PO TABS: 5.0000 mg | ORAL_TABLET | Freq: Four times a day (QID) | ORAL | 0 refills | Status: AC | PRN

## 2018-11-19 MED ORDER — LEVETIRACETAM 500 MG PO TABS: 500.0000 mg | ORAL_TABLET | Freq: Two times a day (BID) | ORAL | 0 refills | Status: DC

## 2018-11-19 MED ORDER — POTASSIUM CHLORIDE CRYS ER 20 MEQ PO TBCR
40.0000 meq | EXTENDED_RELEASE_TABLET | Freq: Once | ORAL | Status: AC
  Administered 2018-11-19: 40 meq via ORAL
  Filled 2018-11-19: qty 2

## 2018-11-19 MED ORDER — THIAMINE HCL 100 MG PO TABS: 100.0000 mg | ORAL_TABLET | Freq: Every day | ORAL | 0 refills | Status: DC

## 2018-11-19 MED ORDER — FOLIC ACID 1 MG PO TABS: 1.0000 mg | ORAL_TABLET | Freq: Every day | ORAL | 0 refills | Status: AC

## 2018-11-19 NOTE — Progress Notes (Signed)
Pt given discharge instructions, prescriptions, and care notes. Pt verbalized understanding AEB no further questions or concerns at this time. IV was discontinued, no redness, pain, or swelling noted at this time. Telemetry discontinued and Centralized Telemetry was notified. Pt left the floor via PTAR in stable condition. Skin dry and intact. 

## 2018-11-19 NOTE — Care Management Note (Signed)
Case Management Note  Patient Details  Name: Mason Hart MRN: [id number] Date of Birth: 03-26-1956  Subjective/Objective:   Admitted with  Seizures, history of seizures and alcoholism. From home with caregiver, Mason Hart.        Mason Hart(son),- [phone]. Mason Hart, caregiver- [phone]  PCP: Mason Hart  Action/Plan: Transition to home with home health services to follow. Decline SNF placement. PTAR to provide transportation to home Expected Discharge Date:  11/19/18               Expected Discharge Plan:  Home w Home Health Services  In-House Referral:  NA  Discharge planning Services  CM Consult  Post Acute Care Choice:    Choice offered to:  Adult children  [facility]  D[facility] Agency:  [employer], son to pick up from home store    HH Arranged:  RN, OT, PT, Nurse's Aide HH Agency:  [facility]  Status of Service:  Completed, signed off  If discussed at Long Length of Stay Meetings, dates discussed:    Additional Comments:  Epifanio LeschesCole, Taneal Sonntag Hudson, RN 11/19/2018, 1:03 PM

## 2018-11-19 NOTE — Discharge Summary (Signed)
Discharge Summary  Mason Hart WNU:[id number] DOB: Apr 26, 1956  PCP: [name], [name]  Admit date: 11/13/2018 Discharge date: 11/19/2018  Time spent: 30 mins  Recommendations for Outpatient Follow-up:  1. PCP 2. Orthopedics 3. Neurology  Discharge Diagnoses:  Active Hospital Problems   Diagnosis Date Noted  . Seizure (HCC) 06/24/2017  . ARF (acute renal failure) (HCC) 11/14/2018  . Clavicle fracture 11/14/2018  . Hypoglycemia 06/24/2017  . Alcohol use 06/24/2017    Resolved Hospital Problems  No resolved problems to display.    Discharge Condition: Stable  Diet recommendation: Heart healthy  Vitals:   11/18/18 2048 11/19/18 0539  BP: (!) 139/92 135/82  Pulse: 74 72  Resp:    Temp: 99 F (37.2 C) 98.2 F (36.8 C)  SpO2: 100% 100%    History of present illness:  62 y.o.malewithknown history of seizures and alcoholism was brought to the ER after patient had shaking spells at home. As per the report, patient's wife called EMS and patient was found to be hypoglycemic with CBG of 60. Patient was given some juice and it improved to 120. Patient on exam had some left-sided weakness. Patient was brought to the ER. MRI unremarkable for any acute intracranial findings. EEG no active seizure activities. Hospital course complicated by generalized weakness..  PT assessed and recommended SNF of which patient and family refused. Also pain in his right upper extremity.  Duplex ultrasound done and negative for DVT.   Today, patient denies any new complaints, no further seizure activity noted.  Still with some intermittent pain in his right upper extremity, range of motion improving.  Patient and family refused SNF, will discharge patient with home health PT/OT, RN, aide.  Hospital Course:  Principal Problem:   Seizure [facility]) Active Problems:   Hypoglycemia   Alcohol use   ARF (acute renal failure) (HCC)   Clavicle fracture  Seizures in the setting of seizure  disorder No further episodes Dilantin level low Continue Keppra 500 mg twice daily as recommended by neurology MRI brain and EEG unremarkable for any acute findings Follow-up with neurology as an outpatient  Right upper and right lower extremity weakness, present on admission Negative MRI for any acute findings Neurology suspects Todd's paralysis Follow-up with neurology as an outpatient  Right upper extremity swelling with tenderness, improving Duplex ultrasound of right upper extremity negative for DVT Home health PT/OT  Chronic alcohol abuse with concern for withdrawal No withdrawal episodes noted Continue multivitamin thiamine and folate Counseled on alcohol cessation  Left clavicle fracture, present on admission Secondary to fall Presented with elevated CPK 1100 which is trending down to 860 Follow-up with orthopedics as an outpatient  Cervical spondylosis without acute cervical fracture Pain management in place as needed  Gram-positive cocci coagulase negative blood culture 1 out of 2 Likely contaminant Repeat blood cultures x2 peripherally, no growth till date  Ambulatory dysfunction/falls Could be related to chronic alcohol abuse Negative MRI for any acute findings PT assessed and recommended SNF, patient and family refused Home health PT/OT         Malnutrition Type:      Malnutrition Characteristics:      Nutrition Interventions:      Estimated body mass index is 25.92 kg/m as calculated from the following:   Height as of this encounter: 6\' 1"  (1.854 m).   Weight as of this encounter: 89.1 kg.    Procedures:  None  Consultations:  Neurology  Discharge Exam: BP 135/82 (BP Location: Right Arm)  Pulse 72   Temp 98.2 F (36.8 C) (Oral)   Resp 20   Ht 6\' 1"  (1.854 m)   Wt 89.1 kg   SpO2 100%   BMI 25.92 kg/m   General: NAD Cardiovascular: S1, S2 present Respiratory: CTA B  Discharge Instructions You were cared for  by a hospitalist during your hospital stay. If you have any questions about your discharge medications or the care you received while you were in the hospital after you are discharged, you can call the unit and asked to speak with the hospitalist on call if the hospitalist that took care of you is not available. Once you are discharged, your primary care physician will handle any further medical issues. Please note that NO REFILLS for any discharge medications will be authorized once you are discharged, as it is imperative that you return to your primary care physician (or establish a relationship with a primary care physician if you do not have one) for your aftercare needs so that they can reassess your need for medications and monitor your lab values.   Allergies as of 11/19/2018   No Known Allergies     Medication List    STOP taking these medications   phenytoin 100 MG ER capsule Commonly known as:  DILANTIN     TAKE these medications   folic acid 1 MG tablet Commonly known as:  FOLVITE Take 1 tablet (1 mg total) by mouth daily. Start taking on:  November 20, 2018   levETIRAcetam 500 MG tablet Commonly known as:  KEPPRA Take 1 tablet (500 mg total) by mouth 2 (two) times daily.   multivitamin with minerals Tabs tablet Take 1 tablet by mouth daily. Start taking on:  November 20, 2018   oxyCODONE 5 MG immediate release tablet Commonly known as:  Oxy IR/ROXICODONE Take 1 tablet (5 mg total) by mouth every 6 (six) hours as needed for up to 7 days for severe pain or breakthrough pain.   thiamine 100 MG tablet Take 1 tablet (100 mg total) by mouth daily. Start taking on:  November 20, 2018      No Known Allergies Follow-up Information    Toni Arthurs, MD. Schedule an appointment as soon as possible for a visit in 1 week.   Specialty:  Orthopedic Surgery Why:  CLAVICLE fracture, use sling for comfort Contact information: [address] [place]  [address] [phone]        [facility]. Schedule an appointment as soon as possible for a visit in 3 days.   Why:  to discuss seizure care, please take keppra twice a day. Contact information: [address]     [address] [place] [address] [phone]       [name], [name]. Schedule an appointment as soon as possible for a visit in 1 week(s).   Specialty:  Family Medicine Contact information: [address] [place] [address] [phone]            The results of significant diagnostics from this hospitalization (including imaging, microbiology, ancillary and laboratory) are listed below for reference.    Significant Diagnostic Studies: Dg Pelvis 1-2 Views  Result Date: 11/14/2018 CLINICAL DATA:  Seizure and fall today. No hip or pelvic complaints. EXAM: PELVIS - 1-2 VIEW COMPARISON:  None. FINDINGS: There is no evidence of pelvic fracture or diastasis. No pelvic bone lesions are seen. Degenerative changes in both hips. IMPRESSION: No acute bony abnormalities. Electronically Signed  By: Burman NievesWilliam  Stevens M.D.   On: 11/14/2018 06:18   Dg Shoulder Right  Result Date: 11/14/2018 CLINICAL DATA:  Shoulder pain EXAM: RIGHT SHOULDER - 2+ VIEW COMPARISON:  None. FINDINGS: Contour abnormalities of the right shoulder may be related to remote trauma. No acute fracture or dislocation. Visualized right lung is clear. Moderate right glenohumeral osteoarthrosis. IMPRESSION: No acute abnormality of the right shoulder. Chronic deformity (possibly posttraumatic) and moderate glenohumeral osteoarthrosis. Electronically Signed   By: Deatra RobinsonKevin  Herman M.D.   On: 11/14/2018 05:06   Ct Head Wo Contrast  Result Date: 11/13/2018 CLINICAL DATA:  History of seizures with altered level of consciousness. EXAM: CT HEAD WITHOUT CONTRAST CT CERVICAL SPINE WITHOUT CONTRAST TECHNIQUE: Multidetector CT imaging of the head and cervical spine was  performed following the standard protocol without intravenous contrast. Multiplanar CT image reconstructions of the cervical spine were also generated. COMPARISON:  01/24/2018 FINDINGS: CT HEAD FINDINGS Brain: Chronic stable age related involutional changes of the brain. Chronic mild-to-moderate small vessel ischemia the periventricular and subcortical white matter. No large vascular territory infarct, hemorrhage, intra-axial mass nor extra-axial fluid. No effacement of the basal cisterns. The brainstem and cerebellum appear nonacute. Vascular: No hyperdense vessel sign. Skull: No acute calvarial fracture or significant calvarial soft tissue swelling. Sinuses/Orbits: Left maxillary sinus wall thickening with inspissated mucus and evidence left ostiomeatal unit obstruction causing opacification of the left frontal sinus, ethmoid and included maxillary sinuses. Findings are in keeping with chronic sinus disease. Intact orbits and globes. Other: None CT CERVICAL SPINE FINDINGS Alignment: Minimal anterolisthesis of C5 on C6. Skull base and vertebrae: Intact skull base. No acute cervical spine fracture. No suspicious osseous lesions. Bulky osteophytes are noted off the anterior aspect of the cervical spine at all levels. This is between C3 and C5. Soft tissues and spinal canal: No prevertebral soft tissue swelling. No visible canal hematoma. Disc levels: Moderate to marked disc flattening at all levels of the cervical spine. Mild osseous central canal stenosis at C6-7. Uncovertebral joint osteoarthritis on the right at C2-3, bilaterally at C4-5, C5-6 and C6-7. These contribute to moderate encroachment greatest at C5-6 on the right. Upper chest: Negative. Other: None IMPRESSION: 1. Chronic mild-to-moderate small vessel ischemic disease of periventricular and subcortical white matter. No acute intracranial abnormality. 2. Chronic paranasal sinusitis. 3. Cervical spondylosis without acute cervical spine fracture.  Electronically Signed   By: Tollie Ethavid  Kwon M.D.   On: 11/13/2018 22:50   Ct Cervical Spine Wo Contrast  Result Date: 11/13/2018 CLINICAL DATA:  History of seizures with altered level of consciousness. EXAM: CT HEAD WITHOUT CONTRAST CT CERVICAL SPINE WITHOUT CONTRAST TECHNIQUE: Multidetector CT imaging of the head and cervical spine was performed following the standard protocol without intravenous contrast. Multiplanar CT image reconstructions of the cervical spine were also generated. COMPARISON:  01/24/2018 FINDINGS: CT HEAD FINDINGS Brain: Chronic stable age related involutional changes of the brain. Chronic mild-to-moderate small vessel ischemia the periventricular and subcortical white matter. No large vascular territory infarct, hemorrhage, intra-axial mass nor extra-axial fluid. No effacement of the basal cisterns. The brainstem and cerebellum appear nonacute. Vascular: No hyperdense vessel sign. Skull: No acute calvarial fracture or significant calvarial soft tissue swelling. Sinuses/Orbits: Left maxillary sinus wall thickening with inspissated mucus and evidence left ostiomeatal unit obstruction causing opacification of the left frontal sinus, ethmoid and included maxillary sinuses. Findings are in keeping with chronic sinus disease. Intact orbits and globes. Other: None CT CERVICAL SPINE FINDINGS Alignment: Minimal anterolisthesis of C5 on C6. Skull  base and vertebrae: Intact skull base. No acute cervical spine fracture. No suspicious osseous lesions. Bulky osteophytes are noted off the anterior aspect of the cervical spine at all levels. This is between C3 and C5. Soft tissues and spinal canal: No prevertebral soft tissue swelling. No visible canal hematoma. Disc levels: Moderate to marked disc flattening at all levels of the cervical spine. Mild osseous central canal stenosis at C6-7. Uncovertebral joint osteoarthritis on the right at C2-3, bilaterally at C4-5, C5-6 and C6-7. These contribute to  moderate encroachment greatest at C5-6 on the right. Upper chest: Negative. Other: None IMPRESSION: 1. Chronic mild-to-moderate small vessel ischemic disease of periventricular and subcortical white matter. No acute intracranial abnormality. 2. Chronic paranasal sinusitis. 3. Cervical spondylosis without acute cervical spine fracture. Electronically Signed   By: Tollie Eth M.D.   On: 11/13/2018 22:50   Mr Brain Wo Contrast  Result Date: 11/14/2018 CLINICAL DATA:  Seizure. History of alcohol abuse. Hypoglycemia. Left-sided weakness. EXAM: MRI HEAD WITHOUT CONTRAST TECHNIQUE: Multiplanar, multiecho pulse sequences of the brain and surrounding structures were obtained without intravenous contrast. COMPARISON:  Head CT 11/13/2018 FINDINGS: BRAIN: There is no acute infarct, acute hemorrhage, hydrocephalus or extra-axial collection. The midline structures are normal. No midline shift or other mass effect. There are no old infarcts. Early confluent hyperintense T2-weighted signal of the periventricular and deep white matter, most commonly due to chronic ischemic microangiopathy. Generalized atrophy without lobar predilection. Susceptibility-sensitive sequences show no chronic microhemorrhage or superficial siderosis. VASCULAR: Major intracranial arterial and venous sinus flow voids are normal. SKULL AND UPPER CERVICAL SPINE: Calvarial bone marrow signal is normal. There is no skull base mass. Visualized upper cervical spine and soft tissues are normal. SINUSES/ORBITS: There is complete opacification of the left frontal sinus with near complete opacification of the left maxillary and ethmoid sinuses. No mastoid or middle ear effusion. The orbits are normal. IMPRESSION: 1. No acute intracranial abnormality. 2. Chronic microvascular ischemia and generalized atrophy. 3. Left ostiomeatal complex pattern chronic sinusitis. Electronically Signed   By: Deatra Robinson M.D.   On: 11/14/2018 06:09   Dg Chest Port 1  View  Result Date: 11/13/2018 CLINICAL DATA:  Altered mental status EXAM: PORTABLE CHEST 1 VIEW COMPARISON:  01/24/2018 FINDINGS: Shallow inspiration. Heart size and pulmonary vascularity are normal. Lungs appear clear and expanded. No blunting of costophrenic angles. No pneumothorax. Mediastinal contours appear intact. Degenerative changes in the thoracic spine and shoulders. Displaced fracture of the midshaft left clavicle, new since previous study. IMPRESSION: No evidence of active pulmonary disease. Displaced fracture of the midshaft left clavicle. Electronically Signed   By: Burman Nieves M.D.   On: 11/13/2018 22:38   Vas [place] Upper Extremity Venous Duplex  Result Date: 11/17/2018 UPPER VENOUS STUDY  Indications: Pain, and Swelling Limitations: Patient unable to move arm away from body or turn arm secondary to pain. Comparison Study: no prior study Performing Technologist: Sherren Kerns RVS  Examination Guidelines: A complete evaluation includes B-mode imaging, spectral Doppler, color Doppler, and power Doppler as needed of all accessible portions of each vessel. Bilateral testing is considered an integral part of a complete examination. Limited examinations for reoccurring indications may be performed as noted.  Right Findings: +----------+------------+----------+---------+-----------+----------+ RIGHT     CompressiblePropertiesPhasicitySpontaneous Summary   +----------+------------+----------+---------+-----------+----------+ IJV           Full                 Yes       Yes               +----------+------------+----------+---------+-----------+----------+  Subclavian                         Yes       Yes               +----------+------------+----------+---------+-----------+----------+ Axillary      Full                 Yes       Yes               +----------+------------+----------+---------+-----------+----------+ Brachial      Full                 Yes       Yes                +----------+------------+----------+---------+-----------+----------+ Radial        Full                                             +----------+------------+----------+---------+-----------+----------+ Ulnar                                               visualized +----------+------------+----------+---------+-----------+----------+ Cephalic      Full                                             +----------+------------+----------+---------+-----------+----------+ Basilic       Full                                             +----------+------------+----------+---------+-----------+----------+  Summary:  Right: No evidence of deep vein thrombosis in the upper extremity. However, unable to visualize the not all segments of the basilic vein. No evidence of superficial vein thrombosis in the upper extremity. However, unable to visualize the not all segments of the basilic vein. No evidence of thrombosis in the . However, unable to visualize the not all segments of the basilic vein. This was a limited study.  *See table(s) above for measurements and observations.  Diagnosing physician: Sherald Hess MD Electronically signed by Sherald Hess MD on 11/17/2018 at 10:43:12 AM.    Final     Microbiology: Recent Results (from the past 240 hour(s))  Blood culture (routine x 2)     Status: Abnormal   Collection Time: 11/13/18 11:00 PM  Result Value Ref Range Status   Specimen Description BLOOD LEFT UPPER ARM  Final   Special Requests   Final    BOTTLES DRAWN AEROBIC AND ANAEROBIC Blood Culture adequate volume   Culture  Setup Time   Final    GRAM POSITIVE COCCI AEROBIC BOTTLE ONLY CRITICAL RESULT CALLED TO, READ BACK BY AND VERIFIED WITHShela Commons [facility] PHARMD [id number] 11/15/18 A BROWNING    Culture (A)  Final    STAPHYLOCOCCUS SPECIES (COAGULASE NEGATIVE) THE SIGNIFICANCE OF ISOLATING THIS ORGANISM FROM A SINGLE SET OF BLOOD CULTURES WHEN MULTIPLE SETS ARE DRAWN IS  UNCERTAIN. PLEASE NOTIFY THE MICROBIOLOGY DEPARTMENT WITHIN ONE WEEK IF SPECIATION AND SENSITIVITIES ARE REQUIRED.  Performed at [facility], [address] [place], [place] [address]    Report Status 11/17/2018 FINAL  Final  Blood Culture ID Panel (Reflexed)     Status: Abnormal   Collection Time: 11/13/18 11:00 PM  Result Value Ref Range Status   Enterococcus species NOT DETECTED NOT DETECTED Final   Listeria monocytogenes NOT DETECTED NOT DETECTED Final   Staphylococcus species DETECTED (A) NOT DETECTED Final    Comment: Methicillin (oxacillin) susceptible coagulase negative staphylococcus. Possible blood culture contaminant (unless isolated from more than one blood culture draw or clinical case suggests pathogenicity). No antibiotic treatment is indicated for blood  culture contaminants. CRITICAL RESULT CALLED TO, READ BACK BY AND VERIFIED WITHMelven Sartorius: J LEDFORD PHARMD [id number] 11/15/18 A BROWNING    Staphylococcus aureus (BCID) NOT DETECTED NOT DETECTED Final   Methicillin resistance NOT DETECTED NOT DETECTED Final   Streptococcus species NOT DETECTED NOT DETECTED Final   Streptococcus agalactiae NOT DETECTED NOT DETECTED Final   Streptococcus pneumoniae NOT DETECTED NOT DETECTED Final   Streptococcus pyogenes NOT DETECTED NOT DETECTED Final   Acinetobacter baumannii NOT DETECTED NOT DETECTED Final   Enterobacteriaceae species NOT DETECTED NOT DETECTED Final   Enterobacter cloacae complex NOT DETECTED NOT DETECTED Final   Escherichia coli NOT DETECTED NOT DETECTED Final   Klebsiella oxytoca NOT DETECTED NOT DETECTED Final   Klebsiella pneumoniae NOT DETECTED NOT DETECTED Final   Proteus species NOT DETECTED NOT DETECTED Final   Serratia marcescens NOT DETECTED NOT DETECTED Final   Haemophilus influenzae NOT DETECTED NOT DETECTED Final   Neisseria meningitidis NOT DETECTED NOT DETECTED Final   Pseudomonas aeruginosa NOT DETECTED NOT DETECTED Final   Candida albicans NOT DETECTED NOT  DETECTED Final   Candida glabrata NOT DETECTED NOT DETECTED Final   Candida krusei NOT DETECTED NOT DETECTED Final   Candida parapsilosis NOT DETECTED NOT DETECTED Final   Candida tropicalis NOT DETECTED NOT DETECTED Final    Comment: Performed at [facility], [address] [place], [place] [address]  Blood culture (routine x 2)     Status: None   Collection Time: 11/13/18 11:03 PM  Result Value Ref Range Status   Specimen Description BLOOD RIGHT HAND  Final   Special Requests   Final    BOTTLES DRAWN AEROBIC AND ANAEROBIC Blood Culture results may not be optimal due to an excessive volume of blood received in culture bottles   Culture   Final    NO GROWTH 5 DAYS Performed at [facility], [address] [place], [place] [address]    Report Status 11/18/2018 FINAL  Final  Culture, blood (routine x 2)     Status: None (Preliminary result)   Collection Time: 11/16/18  4:34 PM  Result Value Ref Range Status   Specimen Description BLOOD RIGHT HAND  Final   Special Requests   Final    BOTTLES DRAWN AEROBIC ONLY Blood Culture results may not be optimal due to an inadequate volume of blood received in culture bottles   Culture   Final    NO GROWTH 3 DAYS Performed at [facility], [address] [place], [place] [address]    Report Status PENDING  Incomplete  Culture, blood (routine x 2)     Status: None (Preliminary result)   Collection Time: 11/16/18  4:34 PM  Result Value Ref Range Status   Specimen Description BLOOD SITE NOT SPECIFIED  Final   Special Requests   Final  BOTTLES DRAWN AEROBIC ONLY Blood Culture results may not be optimal due to an inadequate volume of blood received in culture bottles   Culture   Final    NO GROWTH 3 DAYS Performed at [facility], [address], [place], [place] [address]    Report Status PENDING  Incomplete     Labs: Basic Metabolic Panel: Recent Labs  Lab 11/13/18 2206 11/14/18 0442 11/17/18 0344  11/19/18 0439  NA 134* 135 134* 133*  K 4.2 4.4 3.6 3.2*  CL 101 103 105 99  CO2 20* 20* 21* 24  GLUCOSE 107* 76 99 87  BUN 17 18 10 9   CREATININE 1.76* 1.60* 0.90 0.77  CALCIUM 9.7 8.8* 8.9 8.7*  MG  --   --  1.7 1.9  PHOS  --   --  4.2  --    Liver Function Tests: Recent Labs  Lab 11/13/18 2206 11/14/18 0442 11/17/18 0344  AST 40 53* 40  ALT 22 22 21   ALKPHOS 81 68 60  BILITOT 0.4 0.6 0.5  PROT 8.0 7.0 6.6  ALBUMIN 3.5 3.1* 2.6*   No results for input(s): LIPASE, AMYLASE in the last 168 hours. Recent Labs  Lab 11/13/18 2206  AMMONIA 24   CBC: Recent Labs  Lab 11/13/18 2206 11/14/18 0442 11/17/18 0344 11/19/18 0439  WBC 8.1 5.2 5.9 4.9  NEUTROABS 6.8 3.2 3.4  --   HGB 10.5* 9.0* 9.1* 9.0*  HCT 34.7* 30.5* 29.7* 29.1*  MCV 88.5 89.2 90.5 88.7  PLT 281 266 228 252   Cardiac Enzymes: Recent Labs  Lab 11/14/18 0442 11/17/18 0344 11/19/18 0439  CKTOTAL 1,181* 868* 267  TROPONINI 0.03*  --   --    BNP: BNP (last 3 results) No results for input(s): BNP in the last 8760 hours.  ProBNP (last 3 results) No results for input(s): PROBNP in the last 8760 hours.  CBG: Recent Labs  Lab 11/18/18 1655 11/18/18 2054 11/19/18 0109 11/19/18 0540 11/19/18 0845  GLUCAP 87 81 82 69* 82       Signed:  Briant Cedar, MD [facility] 11/19/2018, 11:17 AM

## 2018-11-21 LAB — CULTURE, BLOOD (ROUTINE X 2)
Culture: NO GROWTH
Culture: NO GROWTH

## 2018-12-04 ENCOUNTER — Emergency Department (HOSPITAL_COMMUNITY): Payer: BC Managed Care – PPO

## 2018-12-04 ENCOUNTER — Other Ambulatory Visit: Payer: Self-pay

## 2018-12-04 ENCOUNTER — Inpatient Hospital Stay (HOSPITAL_COMMUNITY): Payer: BC Managed Care – PPO

## 2018-12-04 ENCOUNTER — Inpatient Hospital Stay (HOSPITAL_COMMUNITY)
Admission: EM | Admit: 2018-12-04 | Discharge: 2018-12-18 | DRG: 100 | Disposition: A | Discharge status: Skilled Nursing Facility | Payer: BC Managed Care – PPO | Attending: Internal Medicine | Admitting: Internal Medicine

## 2018-12-04 ENCOUNTER — Encounter (HOSPITAL_COMMUNITY): Payer: Self-pay | Admitting: Emergency Medicine

## 2018-12-04 DIAGNOSIS — R001 Bradycardia, unspecified: Secondary | ICD-10-CM | POA: Diagnosis present

## 2018-12-04 DIAGNOSIS — R1310 Dysphagia, unspecified: Secondary | ICD-10-CM | POA: Diagnosis present

## 2018-12-04 DIAGNOSIS — R52 Pain, unspecified: Secondary | ICD-10-CM | POA: Diagnosis not present

## 2018-12-04 DIAGNOSIS — E874 Mixed disorder of acid-base balance: Secondary | ICD-10-CM | POA: Diagnosis present

## 2018-12-04 DIAGNOSIS — R579 Shock, unspecified: Secondary | ICD-10-CM | POA: Diagnosis present

## 2018-12-04 DIAGNOSIS — N179 Acute kidney failure, unspecified: Secondary | ICD-10-CM | POA: Diagnosis present

## 2018-12-04 DIAGNOSIS — Z6836 Body mass index (BMI) 36.0-36.9, adult: Secondary | ICD-10-CM

## 2018-12-04 DIAGNOSIS — D649 Anemia, unspecified: Secondary | ICD-10-CM | POA: Diagnosis not present

## 2018-12-04 DIAGNOSIS — E162 Hypoglycemia, unspecified: Secondary | ICD-10-CM | POA: Diagnosis present

## 2018-12-04 DIAGNOSIS — R402112 Coma scale, eyes open, never, at arrival to emergency department: Secondary | ICD-10-CM | POA: Diagnosis present

## 2018-12-04 DIAGNOSIS — Z9114 Patient's other noncompliance with medication regimen: Secondary | ICD-10-CM | POA: Diagnosis not present

## 2018-12-04 DIAGNOSIS — E876 Hypokalemia: Secondary | ICD-10-CM | POA: Diagnosis not present

## 2018-12-04 DIAGNOSIS — R197 Diarrhea, unspecified: Secondary | ICD-10-CM | POA: Diagnosis present

## 2018-12-04 DIAGNOSIS — J9811 Atelectasis: Secondary | ICD-10-CM | POA: Diagnosis present

## 2018-12-04 DIAGNOSIS — M7989 Other specified soft tissue disorders: Secondary | ICD-10-CM | POA: Diagnosis present

## 2018-12-04 DIAGNOSIS — R402212 Coma scale, best verbal response, none, at arrival to emergency department: Secondary | ICD-10-CM | POA: Diagnosis present

## 2018-12-04 DIAGNOSIS — G40901 Epilepsy, unspecified, not intractable, with status epilepticus: Secondary | ICD-10-CM | POA: Diagnosis present

## 2018-12-04 DIAGNOSIS — J329 Chronic sinusitis, unspecified: Secondary | ICD-10-CM | POA: Diagnosis present

## 2018-12-04 DIAGNOSIS — R159 Full incontinence of feces: Secondary | ICD-10-CM | POA: Diagnosis present

## 2018-12-04 DIAGNOSIS — T68XXXA Hypothermia, initial encounter: Secondary | ICD-10-CM | POA: Diagnosis present

## 2018-12-04 DIAGNOSIS — J9600 Acute respiratory failure, unspecified whether with hypoxia or hypercapnia: Secondary | ICD-10-CM | POA: Diagnosis not present

## 2018-12-04 DIAGNOSIS — I10 Essential (primary) hypertension: Secondary | ICD-10-CM | POA: Diagnosis present

## 2018-12-04 DIAGNOSIS — G9341 Metabolic encephalopathy: Secondary | ICD-10-CM | POA: Diagnosis present

## 2018-12-04 DIAGNOSIS — F10239 Alcohol dependence with withdrawal, unspecified: Secondary | ICD-10-CM | POA: Diagnosis present

## 2018-12-04 DIAGNOSIS — R569 Unspecified convulsions: Secondary | ICD-10-CM | POA: Diagnosis not present

## 2018-12-04 DIAGNOSIS — L89152 Pressure ulcer of sacral region, stage 2: Secondary | ICD-10-CM | POA: Diagnosis present

## 2018-12-04 DIAGNOSIS — R9431 Abnormal electrocardiogram [ECG] [EKG]: Secondary | ICD-10-CM | POA: Diagnosis present

## 2018-12-04 DIAGNOSIS — R740 Nonspecific elevation of levels of transaminase and lactic acid dehydrogenase [LDH]: Secondary | ICD-10-CM | POA: Diagnosis present

## 2018-12-04 DIAGNOSIS — R9389 Abnormal findings on diagnostic imaging of other specified body structures: Secondary | ICD-10-CM | POA: Diagnosis present

## 2018-12-04 DIAGNOSIS — E871 Hypo-osmolality and hyponatremia: Secondary | ICD-10-CM | POA: Diagnosis present

## 2018-12-04 DIAGNOSIS — I959 Hypotension, unspecified: Secondary | ICD-10-CM | POA: Diagnosis present

## 2018-12-04 DIAGNOSIS — E669 Obesity, unspecified: Secondary | ICD-10-CM | POA: Diagnosis present

## 2018-12-04 DIAGNOSIS — M6282 Rhabdomyolysis: Secondary | ICD-10-CM | POA: Diagnosis present

## 2018-12-04 DIAGNOSIS — J9690 Respiratory failure, unspecified, unspecified whether with hypoxia or hypercapnia: Secondary | ICD-10-CM

## 2018-12-04 DIAGNOSIS — R402312 Coma scale, best motor response, none, at arrival to emergency department: Secondary | ICD-10-CM | POA: Diagnosis present

## 2018-12-04 DIAGNOSIS — J9602 Acute respiratory failure with hypercapnia: Secondary | ICD-10-CM | POA: Diagnosis present

## 2018-12-04 DIAGNOSIS — Z8249 Family history of ischemic heart disease and other diseases of the circulatory system: Secondary | ICD-10-CM

## 2018-12-04 DIAGNOSIS — R21 Rash and other nonspecific skin eruption: Secondary | ICD-10-CM | POA: Diagnosis present

## 2018-12-04 DIAGNOSIS — Z79899 Other long term (current) drug therapy: Secondary | ICD-10-CM

## 2018-12-04 DIAGNOSIS — E861 Hypovolemia: Secondary | ICD-10-CM | POA: Diagnosis present

## 2018-12-04 DIAGNOSIS — J690 Pneumonitis due to inhalation of food and vomit: Secondary | ICD-10-CM | POA: Diagnosis present

## 2018-12-04 DIAGNOSIS — Z978 Presence of other specified devices: Secondary | ICD-10-CM | POA: Diagnosis present

## 2018-12-04 DIAGNOSIS — R000 Tachycardia, unspecified: Secondary | ICD-10-CM | POA: Diagnosis present

## 2018-12-04 DIAGNOSIS — X58XXXA Exposure to other specified factors, initial encounter: Secondary | ICD-10-CM | POA: Diagnosis present

## 2018-12-04 DIAGNOSIS — R609 Edema, unspecified: Secondary | ICD-10-CM | POA: Diagnosis not present

## 2018-12-04 DIAGNOSIS — Y900 Blood alcohol level of less than 20 mg/100 ml: Secondary | ICD-10-CM | POA: Diagnosis present

## 2018-12-04 LAB — I-STAT TROPONIN, ED: Troponin i, poc: 0.01 ng/mL (ref 0.00–0.08)

## 2018-12-04 LAB — COMPREHENSIVE METABOLIC PANEL
ALT: 27 U/L (ref 0–44)
AST: 68 U/L — ABNORMAL HIGH (ref 15–41)
Albumin: 3.1 g/dL — ABNORMAL LOW (ref 3.5–5.0)
Alkaline Phosphatase: 79 U/L (ref 38–126)
Anion gap: 15 (ref 5–15)
BUN: 15 mg/dL (ref 8–23)
CO2: 16 mmol/L — AB (ref 22–32)
Calcium: 9.4 mg/dL (ref 8.9–10.3)
Chloride: 100 mmol/L (ref 98–111)
Creatinine, Ser: 0.77 mg/dL (ref 0.61–1.24)
GFR calc Af Amer: >60 mL/min (ref >60)
GFR calc non Af Amer: >60 mL/min (ref >60)
GLUCOSE: 88 mg/dL (ref 70–99)
Potassium: 3.1 mmol/L — ABNORMAL LOW (ref 3.5–5.1)
SODIUM: 131 mmol/L — AB (ref 135–145)
Total Bilirubin: 0.6 mg/dL (ref 0.3–1.2)
Total Protein: 7.6 g/dL (ref 6.5–8.1)

## 2018-12-04 LAB — TROPONIN I: Troponin I: <0.03 ng/mL (ref <0.03)

## 2018-12-04 LAB — RAPID URINE DRUG SCREEN, HOSP PERFORMED
Amphetamines: NOT DETECTED
Barbiturates: NOT DETECTED
Benzodiazepines: NOT DETECTED
Cocaine: NOT DETECTED
Opiates: NOT DETECTED
Tetrahydrocannabinol: NOT DETECTED

## 2018-12-04 LAB — CBC
HCT: 36.9 % — ABNORMAL LOW (ref 39.0–52.0)
Hemoglobin: 11.2 g/dL — ABNORMAL LOW (ref 13.0–17.0)
MCH: 27 pg (ref 26.0–34.0)
MCHC: 30.4 g/dL (ref 30.0–36.0)
MCV: 88.9 fL (ref 80.0–100.0)
Platelets: 290 10*3/uL (ref 150–400)
RBC: 4.15 MIL/uL — ABNORMAL LOW (ref 4.22–5.81)
RDW: 14.6 % (ref 11.5–15.5)
WBC: 5.5 10*3/uL (ref 4.0–10.5)
nRBC: 0 % (ref 0.0–0.2)

## 2018-12-04 LAB — I-STAT ARTERIAL BLOOD GAS, ED
Acid-base deficit: 7 mmol/L — ABNORMAL HIGH (ref 0.0–2.0)
Bicarbonate: 20.4 mmol/L (ref 20.0–28.0)
O2 Saturation: 100 %
PH ART: 7.219 — AB (ref 7.350–7.450)
Patient temperature: 98
TCO2: 22 mmol/L (ref 22–32)
pCO2 arterial: 49.8 mmHg — ABNORMAL HIGH (ref 32.0–48.0)
pO2, Arterial: 427 mmHg — ABNORMAL HIGH (ref 83.0–108.0)

## 2018-12-04 LAB — PHENYTOIN LEVEL, TOTAL: Phenytoin Lvl: <2.5 ug/mL — ABNORMAL LOW (ref 10.0–20.0)

## 2018-12-04 LAB — TRIGLYCERIDES: Triglycerides: 22 mg/dL (ref <150)

## 2018-12-04 LAB — ETHANOL: Alcohol, Ethyl (B): 14 mg/dL — ABNORMAL HIGH (ref <10)

## 2018-12-04 LAB — PHOSPHORUS: Phosphorus: 4.6 mg/dL (ref 2.5–4.6)

## 2018-12-04 LAB — CBG MONITORING, ED
Glucose-Capillary: 67 mg/dL — ABNORMAL LOW (ref 70–99)
Glucose-Capillary: 148 mg/dL — ABNORMAL HIGH (ref 70–99)

## 2018-12-04 LAB — LACTIC ACID, PLASMA: Lactic Acid, Venous: 5.7 mmol/L (ref 0.5–1.9)

## 2018-12-04 LAB — CK: CK TOTAL: 731 U/L — AB (ref 49–397)

## 2018-12-04 LAB — GLUCOSE, CAPILLARY: Glucose-Capillary: 83 mg/dL (ref 70–99)

## 2018-12-04 LAB — MAGNESIUM: Magnesium: 2 mg/dL (ref 1.7–2.4)

## 2018-12-04 LAB — TSH: TSH: 1.565 u[IU]/mL (ref 0.350–4.500)

## 2018-12-04 MED ORDER — POTASSIUM CHLORIDE 20 MEQ/15ML (10%) PO SOLN
40.0000 meq | Freq: Once | ORAL | Status: AC
  Administered 2018-12-04: 40 meq
  Filled 2018-12-04: qty 30

## 2018-12-04 MED ORDER — SODIUM CHLORIDE 0.9 % IV SOLN
INTRAVENOUS | Status: DC
  Administered 2018-12-04: 22:00:00 via INTRAVENOUS

## 2018-12-04 MED ORDER — LEVETIRACETAM IN NACL 1000 MG/100ML IV SOLN
1000.0000 mg | Freq: Once | INTRAVENOUS | Status: AC
  Administered 2018-12-04: 1000 mg via INTRAVENOUS
  Filled 2018-12-04: qty 100

## 2018-12-04 MED ORDER — ATROPINE SULFATE 1 MG/ML IJ SOLN
INTRAMUSCULAR | Status: AC | PRN
  Administered 2018-12-04: 1 mg via INTRAVENOUS

## 2018-12-04 MED ORDER — MIDAZOLAM HCL 2 MG/2ML IJ SOLN
2.0000 mg | INTRAMUSCULAR | Status: DC | PRN
  Filled 2018-12-04: qty 2

## 2018-12-04 MED ORDER — NOREPINEPHRINE BITARTRATE 1 MG/ML IV SOLN
2.0000 ug/min | INTRAVENOUS | Status: DC
  Administered 2018-12-04 – 2018-12-05 (×2): 5 ug/min via INTRAVENOUS
  Administered 2018-12-05: 10 ug/min via INTRAVENOUS
  Filled 2018-12-04 (×2): qty 4

## 2018-12-04 MED ORDER — PROPOFOL 1000 MG/100ML IV EMUL
0.0000 ug/kg/min | INTRAVENOUS | Status: DC
  Administered 2018-12-04: 10 ug/kg/min via INTRAVENOUS

## 2018-12-04 MED ORDER — LEVETIRACETAM IN NACL 500 MG/100ML IV SOLN
500.0000 mg | Freq: Two times a day (BID) | INTRAVENOUS | Status: DC
  Filled 2018-12-04: qty 100

## 2018-12-04 MED ORDER — ETOMIDATE 2 MG/ML IV SOLN
INTRAVENOUS | Status: AC | PRN
  Administered 2018-12-04: 20 mg via INTRAVENOUS

## 2018-12-04 MED ORDER — FENTANYL CITRATE (PF) 100 MCG/2ML IJ SOLN
100.0000 ug | INTRAMUSCULAR | Status: DC | PRN
  Administered 2018-12-04: 100 ug via INTRAVENOUS

## 2018-12-04 MED ORDER — LORAZEPAM 2 MG/ML IJ SOLN
1.0000 mg | Freq: Once | INTRAMUSCULAR | Status: AC
  Administered 2018-12-04: 1 mg via INTRAVENOUS

## 2018-12-04 MED ORDER — FENTANYL CITRATE (PF) 100 MCG/2ML IJ SOLN
100.0000 ug | INTRAMUSCULAR | Status: DC | PRN
  Filled 2018-12-04: qty 2

## 2018-12-04 MED ORDER — FENTANYL CITRATE (PF) 100 MCG/2ML IJ SOLN
50.0000 ug | Freq: Once | INTRAMUSCULAR | Status: AC
  Administered 2018-12-04: 50 ug via INTRAVENOUS

## 2018-12-04 MED ORDER — SODIUM CHLORIDE 0.9 % IV BOLUS
1000.0000 mL | Freq: Once | INTRAVENOUS | Status: AC
  Administered 2018-12-04: 1000 mL via INTRAVENOUS

## 2018-12-04 MED ORDER — ROCURONIUM BROMIDE 50 MG/5ML IV SOLN
INTRAVENOUS | Status: AC | PRN
  Administered 2018-12-04: 55 mg via INTRAVENOUS

## 2018-12-04 MED ORDER — SODIUM CHLORIDE 0.9 % IV SOLN
250.0000 mL | INTRAVENOUS | Status: DC
  Administered 2018-12-05 – 2018-12-09 (×3): 250 mL via INTRAVENOUS

## 2018-12-04 MED ORDER — SODIUM CHLORIDE 0.9 % IV SOLN
INTRAVENOUS | Status: AC | PRN
  Administered 2018-12-04: 1000 mL via INTRAVENOUS

## 2018-12-04 MED ORDER — PANTOPRAZOLE SODIUM 40 MG IV SOLR: 40.0000 mg | Freq: Every day | INTRAVENOUS | Status: DC

## 2018-12-04 MED ORDER — ETOMIDATE 2 MG/ML IV SOLN: 20.0000 mg | Freq: Once | INTRAVENOUS | Status: DC

## 2018-12-04 MED ORDER — HEPARIN SODIUM (PORCINE) 5000 UNIT/ML IJ SOLN
5000.0000 [IU] | Freq: Three times a day (TID) | INTRAMUSCULAR | Status: DC
  Administered 2018-12-04 – 2018-12-05 (×3): 5000 [IU] via SUBCUTANEOUS
  Filled 2018-12-04 (×3): qty 1

## 2018-12-04 MED ORDER — DEXTROSE 50 % IV SOLN
INTRAVENOUS | Status: AC
  Filled 2018-12-04: qty 50

## 2018-12-04 MED ORDER — DEXTROSE 50 % IV SOLN
1.0000 | Freq: Once | INTRAVENOUS | Status: AC
  Administered 2018-12-04: 50 mL via INTRAVENOUS

## 2018-12-04 MED ORDER — FENTANYL 2500MCG IN NS 250ML (10MCG/ML) PREMIX INFUSION
25.0000 ug/h | INTRAVENOUS | Status: DC
  Administered 2018-12-04: 75 ug/h via INTRAVENOUS
  Filled 2018-12-04: qty 250

## 2018-12-04 MED ORDER — PROPOFOL 1000 MG/100ML IV EMUL
INTRAVENOUS | Status: AC
  Filled 2018-12-04: qty 100

## 2018-12-04 MED ORDER — ROCURONIUM BROMIDE 10 MG/ML (PF) SYRINGE
0.6000 mg/kg | PREFILLED_SYRINGE | Freq: Once | INTRAVENOUS | Status: DC
  Filled 2018-12-04: qty 10

## 2018-12-04 MED ORDER — MIDAZOLAM HCL 2 MG/2ML IJ SOLN
2.0000 mg | INTRAMUSCULAR | Status: DC | PRN
  Administered 2018-12-05: 2 mg via INTRAVENOUS

## 2018-12-04 MED ORDER — FENTANYL BOLUS VIA INFUSION
50.0000 ug | INTRAVENOUS | Status: DC | PRN
  Filled 2018-12-04: qty 50

## 2018-12-04 MED ORDER — LEVETIRACETAM IN NACL 1000 MG/100ML IV SOLN
1000.0000 mg | Freq: Two times a day (BID) | INTRAVENOUS | Status: DC
  Administered 2018-12-05 – 2018-12-10 (×12): 1000 mg via INTRAVENOUS
  Filled 2018-12-04 (×12): qty 100

## 2018-12-04 NOTE — ED Notes (Signed)
Pt placed on warming blanket at this time

## 2018-12-04 NOTE — ED Provider Notes (Signed)
[facility] EMERGENCY DEPARTMENT Provider Note   CSN: [id number] Arrival date & time: 12/04/18  1940   Level 5 caveat: Altered mental status  History   Chief Complaint Chief Complaint  Patient presents with  . Seizures    HPI Dalano Latka is a 63 y.o. male.  HPI Patient presents to the emergency room for evaluation of seizures.  Patient has a history of seizure disorder.  He was last seen in the emergency room in December for recurrent seizures.  According to EMS report the patient was last seen by family members at about 3 PM.  He was found shortly before EMS arrival actively seizing.  Patient was noted to be altered by EMS.  He had episodes of bradycardia.  They were proceeding with transcutaneous pacing.  No seizure medications were given.  In the ED the patient was actively seizing.  Patient was unable to provide history Past Medical History:  Diagnosis Date  . Noncompliance with medication regimen   . Seizures [facility])     Patient Active Problem List   Diagnosis Date Noted  . ARF (acute renal failure) (HCC) 11/14/2018  . Clavicle fracture 11/14/2018  . Hypokalemia 06/24/2017  . Hypoglycemia 06/24/2017  . Alcohol use 06/24/2017  . Seizure (HCC) 06/24/2017  . Noncompliance with medication regimen   . Seizures (HCC)     History reviewed. No pertinent surgical history.      Home Medications    Prior to Admission medications   Medication Sig Start Date End Date Taking? Authorizing Provider  folic acid (FOLVITE) 1 MG tablet Take 1 tablet (1 mg total) by mouth daily. 11/20/18 12/20/18  Briant Cedar, MD  levETIRAcetam (KEPPRA) 500 MG tablet Take 1 tablet (500 mg total) by mouth 2 (two) times daily. 11/19/18 12/19/18  Briant Cedar, MD  Multiple Vitamin (MULTIVITAMIN WITH MINERALS) TABS tablet Take 1 tablet by mouth daily. 11/20/18   Briant Cedar, MD  thiamine 100 MG tablet Take 1 tablet (100 mg total) by mouth daily. 11/20/18 12/20/18   Briant Cedar, MD    Family History Family History  Problem Relation Age of Onset  . Heart disease Mother   . Cancer Neg Hx   . Diabetes Neg Hx   . Stroke Neg Hx     Social History Social History   Tobacco Use  . Smoking status: Never Smoker  . Smokeless tobacco: Never Used  Substance Use Topics  . Alcohol use: Yes    Alcohol/week: 7.0 standard drinks    Types: 7 Cans of beer per week    Comment: occassionally  . Drug use: No     Allergies   Patient has no known allergies.   Review of Systems Review of Systems  All other systems reviewed and are negative.    Physical Exam Updated Vital Signs BP 96/72   Pulse 61   Temp (!) 80.8 F (27.1 C)   Resp 14   Ht 1.829 m (6')   Wt 89.1 kg   SpO2 100%   BMI 26.64 kg/m   Physical Exam Vitals signs and nursing note reviewed.  Constitutional:      Appearance: He is well-developed. He is ill-appearing.  HENT:     Head: Normocephalic and atraumatic.     Right Ear: External ear normal.     Left Ear: External ear normal.  Eyes:     General: No scleral icterus.       Right eye: No discharge.  Left eye: No discharge.     Conjunctiva/sclera: Conjunctivae normal.  Neck:     Musculoskeletal: Neck supple.     Trachea: No tracheal deviation.  Cardiovascular:     Rate and Rhythm: Normal rate and regular rhythm.  Pulmonary:     Effort: Pulmonary effort is normal. No respiratory distress.     Breath sounds: Normal breath sounds. No stridor. No wheezing or rales.     Comments: Sonorous respirations Abdominal:     General: Bowel sounds are normal. There is no distension.     Palpations: Abdomen is soft.     Tenderness: There is no abdominal tenderness. There is no guarding or rebound.  Genitourinary:    Comments: Patient was incontinent of stool Musculoskeletal:        General: No tenderness.  Skin:    General: Skin is dry.     Findings: No rash.     Comments: Cold to the touch  Neurological:      GCS: GCS eye subscore is 1. GCS verbal subscore is 1. GCS motor subscore is 1.     Motor: Seizure activity present.     Comments: Patient did not respond to painful or verbal stimuli, no movements noted, active tonic-clonic activity on arrival      ED Treatments / Results  Labs (all labs ordered are listed, but only abnormal results are displayed) Labs Reviewed  COMPREHENSIVE METABOLIC PANEL - Abnormal; Notable for the following components:      Result Value   Sodium 131 (*)    Potassium 3.1 (*)    CO2 16 (*)    Albumin 3.1 (*)    AST 68 (*)    All other components within normal limits  CBC - Abnormal; Notable for the following components:   RBC 4.15 (*)    Hemoglobin 11.2 (*)    HCT 36.9 (*)    All other components within normal limits  ETHANOL - Abnormal; Notable for the following components:   Alcohol, Ethyl (B) 14 (*)    All other components within normal limits  CK - Abnormal; Notable for the following components:   Total CK 731 (*)    All other components within normal limits  PHENYTOIN LEVEL, TOTAL - Abnormal; Notable for the following components:   Phenytoin Lvl <2.5 (*)    All other components within normal limits  CBG MONITORING, ED - Abnormal; Notable for the following components:   Glucose-Capillary 67 (*)    All other components within normal limits  I-STAT ARTERIAL BLOOD GAS, ED - Abnormal; Notable for the following components:   pH, Arterial 7.219 (*)    pCO2 arterial 49.8 (*)    pO2, Arterial 427.0 (*)    Acid-base deficit 7.0 (*)    All other components within normal limits  CBG MONITORING, ED - Abnormal; Notable for the following components:   Glucose-Capillary 148 (*)    All other components within normal limits  MAGNESIUM  PHOSPHORUS  TRIGLYCERIDES  I-STAT TROPONIN, ED    EKG EKG Interpretation  Date/Time:  Wednesday December 04 2018 19:52:53 EST Ventricular Rate:  56 PR Interval:    QRS Duration: 102 QT Interval:  650 QTC  Calculation: 628 R Axis:   71 Text Interpretation:  Sinus rhythm Borderline ST elevation, anterior leads Prolonged QT interval No significant change since last tracing Confirmed by Linwood DibblesKnapp, Jiya Kissinger [phone]([id number]) on 12/04/2018 9:33:23 PM   Radiology Dg Chest Portable 1 View  Result Date: 12/04/2018 CLINICAL DATA:  Seizure. Bradycardia  upon arrival. History of seizures. Intubation. EXAM: PORTABLE CHEST 1 VIEW COMPARISON:  11/13/2018 FINDINGS: The endotracheal tube tip is 3.6 cm above the carina, satisfactorily positioned. Nasogastric tube enters the stomach. Defibrillator and leads are present. Questionable retrocardiac airspace opacity. The lungs appear otherwise clear. As shown on 11/13/2018, there is a considerably displaced and possibly overlap fracture of the left mid clavicle. IMPRESSION: 1. Endotracheal tube tip 3.6 cm above the carina, satisfactorily positioned. 2. Questionable retrocardiac airspace opacity-atelectasis or pneumonia not entirely excluded. 3. Mid shaft displaced in overlapped clavicular fracture on the left, as shown on 11/13/2018. Electronically Signed   By: Gaylyn RongWalter  Liebkemann M.D.   On: 12/04/2018 20:33    Procedures Procedure Name: Intubation Date/Time: 12/04/2018 8:05 PM Performed by: Linwood DibblesKnapp, Reyden Smith, MD Pre-anesthesia Checklist: Patient identified, Patient being monitored, Emergency Drugs available, Timeout performed and Suction available Oxygen Delivery Method: Non-rebreather mask Preoxygenation: Pre-oxygenation with 100% oxygen Induction Type: Rapid sequence Ventilation: Mask ventilation without difficulty Laryngoscope Size: Glidescope and 4 Grade View: Grade II Tube size: 7.0 mm Number of attempts: 2 Airway Equipment and Method: Stylet and Video-laryngoscopy Placement Confirmation: ETT inserted through vocal cords under direct vision,  CO2 detector and Breath sounds checked- equal and bilateral     .Critical Care Performed by: Linwood DibblesKnapp, Stelios Kirby, MD Authorized by: Linwood DibblesKnapp, Himmat Enberg, MD    Critical care provider statement:    Critical care time (minutes):  45   Critical care was time spent personally by me on the following activities:  Discussions with consultants, evaluation of patient's response to treatment, examination of patient, ordering and performing treatments and interventions, ordering and review of laboratory studies, ordering and review of radiographic studies, pulse oximetry, re-evaluation of patient's condition, obtaining history from patient or surrogate and review of old charts   (including critical care time)  Medications Ordered in ED Medications  rocuronium bromide 10 mg/mL (PF) syringe (53.46 mg Intravenous Not Given 12/04/18 2048)  etomidate (AMIDATE) injection 20 mg (20 mg Intravenous Not Given 12/04/18 2049)  propofol (DIPRIVAN) 1000 MG/100ML infusion (10 mcg/kg/min  89.1 kg Intravenous New Bag/Given 12/04/18 2012)  fentaNYL (SUBLIMAZE) injection 100 mcg (100 mcg Intravenous Given 12/04/18 2009)  fentaNYL (SUBLIMAZE) injection 100 mcg (has no administration in time range)  LORazepam (ATIVAN) injection 1 mg (1 mg Intravenous Given 12/04/18 1944)  atropine injection (1 mg Intravenous Given 12/04/18 1950)  etomidate (AMIDATE) injection (20 mg Intravenous Given 12/04/18 1951)  rocuronium (ZEMURON) injection (55 mg Intravenous Given 12/04/18 1951)  levETIRAcetam (KEPPRA) IVPB 1000 mg/100 mL premix (0 mg Intravenous Stopped 12/04/18 2024)  dextrose 50 % solution 50 mL (50 mLs Intravenous Given 12/04/18 2103)     Initial Impression / Assessment and Plan / ED Course  I have reviewed the triage vital signs and the nursing notes.  Pertinent labs & imaging results that were available during my care of the patient were reviewed by me and considered in my medical decision making (see chart for details).  Clinical Course as of Dec 04 2133  Wed Dec 04, 2018  2101 Temp is extremely low.  Will start warming blanket and warm fluids.     [JK]    Clinical Course User Index [JK]  Linwood DibblesKnapp, Marck Mcclenny, MD    Presented to the emergency room in status epilepticus.  Patient was actively seizing.  He had been incontinent of stool and was covered in stool he was obtunded and unresponsive.  Patient was intubated for airway protection.  Patient's temperature is extremely low.  Unclear what has precipitated  this.  There is no clear signs of infection.  Is possible could be associated with prolonged seizing although this certainly would be unusual.  I will add on a thyroid panel.  We have started him on warm fluids and warming blanket.  Plan admission to the ICU for further evaluation.  Neurology, Dr. Amada Jupiter was also consulted.  Final Clinical Impressions(s) / ED Diagnoses   Final diagnoses:  Status epilepticus (HCC)  Hypothermia, initial encounter      Linwood Dibbles, MD 12/04/18 2135

## 2018-12-04 NOTE — Progress Notes (Addendum)
eLink Physician-Brief Progress Note Patient Name: Mason Hart DOB: 04/10/1956 MRN: [id number]   Date of Service  12/04/2018  HPI/Events of Note  62/M with hx of seizures who was brought in after having a seizure at home.  He was post-ictal upon EMS arrival and pt was also bradycardic.  PT was intubated for airway protection.  Pt noted to be hypothermic. HR in the 50s but hemodynamically stable.  BP at the time of exam is 118/81.  PT is intubated and sedated.   eICU Interventions  Pt has been loaded with Keppra as per Neurology recommendations.  Pt intubated and sedated right now.  No obvious source of infection. Recheck lactate in the AM.  Continue bair hugger.   Decrease IVF rate. Atropine prn ordered for HR < 40 or when symptomatic.       Intervention Category Evaluation Type: New Patient Evaluation  Larinda Buttery 12/04/2018, 11:59 PM

## 2018-12-04 NOTE — Progress Notes (Addendum)
PCCM Interval Progress Note  Received message from ED RN that pt remains hypotensive despite propofol being stopped and NS boluses.  Exacerbated by vasodilation from rewarming.  Will start low dose levophed via PIV.  Since MAP's were 68 - 70s earlier, I anticipate that pt will be able to come off levophed soon or by AM so will hold off on CVL placement for now. May d/c warming blanket if hypotension worsens.   Rutherford Guys, PA [facility] Pager: [phone].  If no answer, [phone] - [phone] 12/04/2018, 11:15 PM

## 2018-12-04 NOTE — H&P (Signed)
NAME:  Mason Hart, MRN:  [id number], DOB:  01/24/1956, LOS: 0 ADMISSION DATE:  12/04/2018, CONSULTATION DATE:  12/04/18 REFERRING MD:  Lynelle Doctor  CHIEF COMPLAINT:  Seizures   Brief History   Mason Hart is a 63 y.o. male who was admitted 1/8 after seizures at home.  He required intubation in the ED for airway protection.  History of present illness   Mason Hart is a 63 y.o. male who has a PMH as outlined below including but not limited to seizures (see "past medical history").  He presented to [facility] ED 1/8 after he had a seizure at his home.  He was apparently post ictal upon EMS arrival with HR in the 30's.  EMS was starting TC pacing, but HR improved.  Upon arrival to ED, he had another seizure which was treated with ativan.  He was later intubated for airway protection.  In ED, he was noted to be hypothermic to 4F (Per EMS, pt was not outside prior to or during seizure and was inside the home).  He was placed on a warming blanket.  Labs were noteable for K of 3.1, CK 731.  EKG showed QTc of 628.    He was seen by neurology and loaded with Keppra (is on Keppra as an outpatient).  PCCM was later called for ICU admission.  Past Medical History  Seizures  Significant Hospital Events   1/8 > admit.  Consults:  Neuro.  Procedures:  ETT 1/8 >   Significant Diagnostic Tests:  CT head 1/8 >  EEG 1/8 >   Micro Data:  Blood 1/8 >  Sputum 1/8 >  Urine 1/8 >   Antimicrobials:  None.   Interim history/subjective:  Sedated on vent but starting to wake up.  Temp 26F.  Objective:  Blood pressure (!) 80/56, pulse (!) 57, temperature (!) 81.5 F (27.5 C), resp. rate 16, height 6' (1.829 m), weight 89.1 kg, SpO2 100 %.    Vent Mode: PRVC FiO2 (%):  [40 %-100 %] 40 % Set Rate:  [14 bmp] 14 bmp Vt Set:  [16 mL-500 mL] 16 mL PEEP:  [5 cmH20] 5 cmH20   Intake/Output Summary (Last 24 hours) at 12/04/2018 2205 Last data filed at 12/04/2018 2024 Gross per 24 hour  Intake 100 ml  Output -    Net 100 ml   Filed Weights   12/04/18 1945  Weight: 89.1 kg    Examination: General: Adult male, in NAD. Neuro: Sedated but starting to move extremities spontaneously.  Does not follow commands. HEENT: [place]/AT. Sclerae anicteric.  Left pupil slightly larger than right; however, both reactive to light. Cardiovascular: RRR, no M/R/G.  Lungs: Respirations even and unlabored.  CTA bilaterally, No W/R/R.  Abdomen: BS x 4, soft, NT/ND.  Musculoskeletal: No gross deformities, no edema.  Skin: Cold, no rashes.  Assessment & Plan:   Respiratory insufficiency with inability to protect the airway - s/p intubation in ED. - Continue full vent support. - SBT in AM if stable. - Bronchial hygiene. - Follow CXR.  Seizures. - Neuro following, appreciate the assistance. - Continue keppra. - F/u on head CT, EEG, UDS.  Hypothermia - unclear etiology. - Continue warming blanket. - F/u on TSH.  Prolonged QTc (628). - D/c propofol. - Avoid all QT prolonging drugs. - Ensure K > 4, Mg > 2.  Hypokalemia. - 40 mEq K per tube. - Follow BMP.  Best Practice:  Diet: NPO. Pain/Anxiety/Delirium protocol (if indicated): Fentanyl gtt / Midazolam PRN.  RASS goal 0 to -1. VAP protocol (if indicated): In place. DVT prophylaxis: SCD's / Heparin. GI prophylaxis: None given prolonged QTc. Glucose control: None. Mobility: Bedrest. Code Status: Full. Family Communication: None available. Disposition: ICU.  Labs   CBC: Recent Labs  Lab 12/04/18 2012  WBC 5.5  HGB 11.2*  HCT 36.9*  MCV 88.9  PLT 290   Basic Metabolic Panel: Recent Labs  Lab 12/04/18 2012  NA 131*  K 3.1*  CL 100  CO2 16*  GLUCOSE 88  BUN 15  CREATININE 0.77  CALCIUM 9.4  MG 2.0  PHOS 4.6   GFR: Estimated Creatinine Clearance: 102.4 mL/min (by C-G formula based on SCr of 0.77 mg/dL). Recent Labs  Lab 12/04/18 2012  WBC 5.5   Liver Function Tests: Recent Labs  Lab 12/04/18 2012  AST 68*  ALT 27  ALKPHOS  79  BILITOT 0.6  PROT 7.6  ALBUMIN 3.1*   No results for input(s): LIPASE, AMYLASE in the last 168 hours. No results for input(s): AMMONIA in the last 168 hours. ABG    Component Value Date/Time   PHART 7.219 (L) 12/04/2018 2133   PCO2ART 49.8 (H) 12/04/2018 2133   PO2ART 427.0 (H) 12/04/2018 2133   HCO3 20.4 12/04/2018 2133   TCO2 22 12/04/2018 2133   ACIDBASEDEF 7.0 (H) 12/04/2018 2133   O2SAT 100.0 12/04/2018 2133    Coagulation Profile: No results for input(s): INR, PROTIME in the last 168 hours. Cardiac Enzymes: Recent Labs  Lab 12/04/18 2012  CKTOTAL 731*   HbA1C: Hgb A1c MFr Bld  Date/Time Value Ref Range Status  12/09/2012 03:06 PM 4.5 (L) 4.6 - 6.5 % Final    Comment:    Glycemic Control Guidelines for People with Diabetes:Non Diabetic:  <6%Goal of Therapy: <7%Additional Action Suggested:  >8%    CBG: Recent Labs  Lab 12/04/18 2059 12/04/18 2131  GLUCAP 67* 148*    Review of Systems:   Unable to obtain as pt is encephalopathic.  Past medical history  He,  has a past medical history of Noncompliance with medication regimen and Seizures (HCC).   Surgical History   History reviewed. No pertinent surgical history.   Social History   reports that he has never smoked. He has never used smokeless tobacco. He reports current alcohol use of about 7.0 standard drinks of alcohol per week. He reports that he does not use drugs.   Family history   His family history includes Heart disease in his mother. There is no history of Cancer, Diabetes, or Stroke.   Allergies No Known Allergies   Home meds  Prior to Admission medications   Medication Sig Start Date End Date Taking? Authorizing Provider  folic acid (FOLVITE) 1 MG tablet Take 1 tablet (1 mg total) by mouth daily. 11/20/18 12/20/18  Briant Cedar, MD  levETIRAcetam (KEPPRA) 500 MG tablet Take 1 tablet (500 mg total) by mouth 2 (two) times daily. 11/19/18 12/19/18  Briant Cedar, MD  Multiple  Vitamin (MULTIVITAMIN WITH MINERALS) TABS tablet Take 1 tablet by mouth daily. 11/20/18   Briant Cedar, MD  thiamine 100 MG tablet Take 1 tablet (100 mg total) by mouth daily. 11/20/18 12/20/18  Briant Cedar, MD    Critical care time: 35 min.    Rutherford Guys, PA [facility] Pager: [phone].  If no answer, [phone] - [phone] 12/04/2018, 10:05 PM

## 2018-12-04 NOTE — Consult Note (Signed)
Neurology Consultation Reason for Consult: Status epilepticus Referring Physician: Roselyn Bering  CC: Status epilepticus  History is obtained from: Chart review  HPI: Mason Hart is a 63 y.o. male with a history of seizure disorder who was last seen well around 3 PM and and subsequently found in the evening and active status epilepticus.  He was brought to the emergency department where he was found to be hypothermic to 26 F, though he reportedly was in the house having a seizure.  On arrival, he continued to be in status epilepticus and was intubated in the ER, he broke with Ativan, though but then was completely unresponsive.  ROS:  Unable to obtain due to altered mental status.   Past Medical History:  Diagnosis Date  . Noncompliance with medication regimen   . Seizures (HCC)      Family History  Problem Relation Age of Onset  . Heart disease Mother   . Cancer Neg Hx   . Diabetes Neg Hx   . Stroke Neg Hx      Social History:  reports that he has never smoked. He has never used smokeless tobacco. He reports current alcohol use of about 7.0 standard drinks of alcohol per week. He reports that he does not use drugs.   Exam: Current vital signs: BP (!) 72/55   Pulse (!) 56   Temp (!) 82 F (27.8 C)   Resp 16   Ht 5\' 8"  (1.727 m)   Wt 89.1 kg   SpO2 99%   BMI 29.87 kg/m  Vital signs in last 24 hours: Temp:  [80.8 F (27.1 C)-82 F (27.8 C)] 82 F (27.8 C) (01/08 2215) Pulse Rate:  [54-66] 56 (01/08 2215) Resp:  [9-16] 16 (01/08 2215) BP: (64-133)/(51-89) 72/55 (01/08 2215) SpO2:  [99 %-100 %] 99 % (01/08 2215) FiO2 (%):  [40 %-100 %] 40 % (01/08 2140) Weight:  [89.1 kg] 89.1 kg (01/08 1945)   Physical Exam  Constitutional: Appears well-developed and well-nourished.  Psych: Affect appropriate to situation Eyes: No scleral injection HENT: No OP obstrucion Head: Normocephalic.  Cardiovascular: Normal rate and regular rhythm.  Respiratory: Effort normal,  non-labored breathing GI: Soft.  No distension. There is no tenderness.    Neuro: Mental Status: Patient is comatose, does not open eyes or follow commands Cranial Nerves: II: Does not blink to threat pupils are equal, round, and reactive to light.   III,IV, VI: Doll's eye intact V:VII: Corneals intact X: Cough intact Motor: No movement to noxious stimulation Sensory: As above Cerebellar: Does not perform   I have reviewed labs in epic and the results pertinent to this consultation are: Magnesium 2.0 CMP- mild hyponatremia at 131 Mild hypokalemia at 3.1 Mild acidosis with a bicarb of 16  I have reviewed the images obtained: CT head-no acute findings  Impression: 63 year old male with a history of seizures with breakthrough status epilepticus.  I am uncertain if he was compliant with his medications prior to his seizure, at this point, however he appears to have been broken.  Given that he is still obtunded, stat EEG is being obtained.  Recommendations: 1) stat EEG 2) increase Keppra to 1 g twice daily 3) neurology will continue to follow   Ritta Slot, MD [facility] [phone]  If 7pm- 7am, please page neurology on call as listed in AMION.

## 2018-12-04 NOTE — ED Triage Notes (Signed)
Pt arrived [facility] from home for reports of seizure activity, pt has a hx of seizures, EMS reports pt was postictal, unable to achieve IV access, Per EMS they were attempting to pace due to HR in the 30s, pt actively seizing upon arrival to ED, GCS 3 HR non paced in the 60s. EMS IV 18 RAC

## 2018-12-04 NOTE — Progress Notes (Signed)
Patient transorted on vent from ED to 89M-03 without complication.

## 2018-12-04 NOTE — Progress Notes (Signed)
Stat EEG completed, results pending  

## 2018-12-05 ENCOUNTER — Inpatient Hospital Stay (HOSPITAL_COMMUNITY): Payer: BC Managed Care – PPO

## 2018-12-05 DIAGNOSIS — R569 Unspecified convulsions: Secondary | ICD-10-CM

## 2018-12-05 DIAGNOSIS — T68XXXA Hypothermia, initial encounter: Secondary | ICD-10-CM

## 2018-12-05 DIAGNOSIS — D649 Anemia, unspecified: Secondary | ICD-10-CM | POA: Diagnosis present

## 2018-12-05 DIAGNOSIS — Z978 Presence of other specified devices: Secondary | ICD-10-CM | POA: Diagnosis present

## 2018-12-05 DIAGNOSIS — E871 Hypo-osmolality and hyponatremia: Secondary | ICD-10-CM | POA: Diagnosis present

## 2018-12-05 DIAGNOSIS — R9389 Abnormal findings on diagnostic imaging of other specified body structures: Secondary | ICD-10-CM | POA: Diagnosis present

## 2018-12-05 DIAGNOSIS — J9600 Acute respiratory failure, unspecified whether with hypoxia or hypercapnia: Secondary | ICD-10-CM

## 2018-12-05 DIAGNOSIS — J329 Chronic sinusitis, unspecified: Secondary | ICD-10-CM | POA: Diagnosis present

## 2018-12-05 DIAGNOSIS — I959 Hypotension, unspecified: Secondary | ICD-10-CM | POA: Diagnosis present

## 2018-12-05 DIAGNOSIS — R001 Bradycardia, unspecified: Secondary | ICD-10-CM | POA: Diagnosis present

## 2018-12-05 LAB — CBC
HCT: 26.7 % — ABNORMAL LOW (ref 39.0–52.0)
HEMATOCRIT: 29.2 % — AB (ref 39.0–52.0)
HEMOGLOBIN: 9.3 g/dL — AB (ref 13.0–17.0)
Hemoglobin: 8.5 g/dL — ABNORMAL LOW (ref 13.0–17.0)
MCH: 26.3 pg (ref 26.0–34.0)
MCH: 26.4 pg (ref 26.0–34.0)
MCHC: 31.8 g/dL (ref 30.0–36.0)
MCHC: 31.8 g/dL (ref 30.0–36.0)
MCV: 82.7 fL (ref 80.0–100.0)
MCV: 82.9 fL (ref 80.0–100.0)
Platelets: 232 10*3/uL (ref 150–400)
Platelets: 190 10*3/uL (ref 150–400)
RBC: 3.53 MIL/uL — ABNORMAL LOW (ref 4.22–5.81)
RBC: 3.22 MIL/uL — ABNORMAL LOW (ref 4.22–5.81)
RDW: 14.3 % (ref 11.5–15.5)
RDW: 14.8 % (ref 11.5–15.5)
WBC: 4 10*3/uL (ref 4.0–10.5)
WBC: 9.7 10*3/uL (ref 4.0–10.5)
nRBC: 0 % (ref 0.0–0.2)
nRBC: 0 % (ref 0.0–0.2)

## 2018-12-05 LAB — BASIC METABOLIC PANEL
Anion gap: 10 (ref 5–15)
BUN: 14 mg/dL (ref 8–23)
CALCIUM: 8.8 mg/dL — AB (ref 8.9–10.3)
CO2: 18 mmol/L — AB (ref 22–32)
Chloride: 103 mmol/L (ref 98–111)
Creatinine, Ser: 0.79 mg/dL (ref 0.61–1.24)
GFR calc non Af Amer: >60 mL/min (ref >60)
Glucose, Bld: 86 mg/dL (ref 70–99)
Potassium: 3.2 mmol/L — ABNORMAL LOW (ref 3.5–5.1)
Sodium: 131 mmol/L — ABNORMAL LOW (ref 135–145)

## 2018-12-05 LAB — GLUCOSE, CAPILLARY
GLUCOSE-CAPILLARY: 93 mg/dL (ref 70–99)
GLUCOSE-CAPILLARY: 75 mg/dL (ref 70–99)
Glucose-Capillary: 50 mg/dL — ABNORMAL LOW (ref 70–99)
Glucose-Capillary: 91 mg/dL (ref 70–99)
Glucose-Capillary: 56 mg/dL — ABNORMAL LOW (ref 70–99)
Glucose-Capillary: 85 mg/dL (ref 70–99)

## 2018-12-05 LAB — TROPONIN I
TROPONIN I: 0.04 ng/mL — AB (ref <0.03)
Troponin I: 0.03 ng/mL (ref <0.03)

## 2018-12-05 LAB — RENAL FUNCTION PANEL
Albumin: 2.3 g/dL — ABNORMAL LOW (ref 3.5–5.0)
Anion gap: 9 (ref 5–15)
BUN: 19 mg/dL (ref 8–23)
CO2: 18 mmol/L — ABNORMAL LOW (ref 22–32)
Calcium: 7.9 mg/dL — ABNORMAL LOW (ref 8.9–10.3)
Chloride: 103 mmol/L (ref 98–111)
Creatinine, Ser: 1.51 mg/dL — ABNORMAL HIGH (ref 0.61–1.24)
GFR calc Af Amer: 56 mL/min — ABNORMAL LOW (ref >60)
GFR calc non Af Amer: 48 mL/min — ABNORMAL LOW (ref >60)
GLUCOSE: 108 mg/dL — AB (ref 70–99)
Phosphorus: 4.2 mg/dL (ref 2.5–4.6)
Potassium: 3.7 mmol/L (ref 3.5–5.1)
Sodium: 130 mmol/L — ABNORMAL LOW (ref 135–145)

## 2018-12-05 LAB — BLOOD GAS, ARTERIAL
Acid-base deficit: 3.4 mmol/L — ABNORMAL HIGH (ref 0.0–2.0)
BICARBONATE: 20.5 mmol/L (ref 20.0–28.0)
Drawn by: 252031
FIO2: 40
LHR: 16 {breaths}/min
MECHVT: 550 mL
O2 Saturation: 93.5 %
PEEP: 5 cmH2O
Patient temperature: 95
pCO2 arterial: 30.1 mmHg — ABNORMAL LOW (ref 32.0–48.0)
pH, Arterial: 7.438 (ref 7.350–7.450)
pO2, Arterial: 65.2 mmHg — ABNORMAL LOW (ref 83.0–108.0)

## 2018-12-05 LAB — SODIUM, URINE, RANDOM: Sodium, Ur: 39 mmol/L

## 2018-12-05 LAB — LACTIC ACID, PLASMA: Lactic Acid, Venous: 2.9 mmol/L (ref 0.5–1.9)

## 2018-12-05 LAB — OSMOLALITY, URINE: Osmolality, Ur: 348 mOsm/kg (ref 300–900)

## 2018-12-05 LAB — MRSA PCR SCREENING: MRSA by PCR: NEGATIVE

## 2018-12-05 LAB — POTASSIUM: Potassium: 3.6 mmol/L (ref 3.5–5.1)

## 2018-12-05 LAB — MAGNESIUM: Magnesium: 1.8 mg/dL (ref 1.7–2.4)

## 2018-12-05 LAB — PHOSPHORUS: Phosphorus: 3.7 mg/dL (ref 2.5–4.6)

## 2018-12-05 LAB — PROCALCITONIN: Procalcitonin: <0.1 ng/mL

## 2018-12-05 LAB — OSMOLALITY: Osmolality: 276 mOsm/kg (ref 275–295)

## 2018-12-05 MED ORDER — DEXTROSE 50 % IV SOLN
1.0000 | Freq: Once | INTRAVENOUS | Status: AC
  Administered 2018-12-05: 50 mL via INTRAVENOUS

## 2018-12-05 MED ORDER — ACETAMINOPHEN 500 MG PO TABS
1000.0000 mg | ORAL_TABLET | Freq: Four times a day (QID) | ORAL | Status: DC | PRN
  Filled 2018-12-05: qty 2

## 2018-12-05 MED ORDER — CHLORHEXIDINE GLUCONATE 0.12% ORAL RINSE (MEDLINE KIT)
15.0000 mL | Freq: Two times a day (BID) | OROMUCOSAL | Status: DC
  Administered 2018-12-05 – 2018-12-06 (×3): 15 mL via OROMUCOSAL

## 2018-12-05 MED ORDER — ATROPINE SULFATE 1 MG/10ML IJ SOSY: 1.0000 mg | PREFILLED_SYRINGE | INTRAMUSCULAR | Status: DC | PRN

## 2018-12-05 MED ORDER — DEXTROSE 50 % IV SOLN
INTRAVENOUS | Status: AC
  Filled 2018-12-05: qty 50

## 2018-12-05 MED ORDER — DEXTROSE 5 % IV BOLUS
600.0000 mL | Freq: Once | INTRAVENOUS | Status: AC
  Administered 2018-12-05: 600 mL via INTRAVENOUS

## 2018-12-05 MED ORDER — FENTANYL CITRATE (PF) 100 MCG/2ML IJ SOLN
50.0000 ug | INTRAMUSCULAR | Status: DC | PRN
  Administered 2018-12-06: 50 ug via INTRAVENOUS
  Filled 2018-12-05: qty 2

## 2018-12-05 MED ORDER — DEXTROSE-NACL 5-0.9 % IV SOLN
INTRAVENOUS | Status: DC
  Administered 2018-12-05 – 2018-12-09 (×7): via INTRAVENOUS

## 2018-12-05 MED ORDER — THIAMINE HCL 100 MG/ML IJ SOLN
100.0000 mg | Freq: Every day | INTRAMUSCULAR | Status: DC
  Administered 2018-12-05 – 2018-12-08 (×4): 100 mg via INTRAVENOUS
  Filled 2018-12-05 (×4): qty 2

## 2018-12-05 MED ORDER — DEXTROSE 5 % IV SOLN
INTRAVENOUS | Status: DC
  Administered 2018-12-05: 14:00:00 via INTRAVENOUS

## 2018-12-05 MED ORDER — MAGNESIUM SULFATE 2 GM/50ML IV SOLN
2.0000 g | Freq: Once | INTRAVENOUS | Status: AC
  Administered 2018-12-05: 2 g via INTRAVENOUS
  Filled 2018-12-05: qty 50

## 2018-12-05 MED ORDER — VITAL AF 1.2 CAL PO LIQD
1000.0000 mL | ORAL | Status: DC
  Administered 2018-12-05 – 2018-12-06 (×2): 1000 mL

## 2018-12-05 MED ORDER — ORAL CARE MOUTH RINSE
15.0000 mL | OROMUCOSAL | Status: DC
  Administered 2018-12-05 – 2018-12-06 (×12): 15 mL via OROMUCOSAL

## 2018-12-05 MED ORDER — FOLIC ACID 5 MG/ML IJ SOLN
1.0000 mg | Freq: Every day | INTRAMUSCULAR | Status: DC
  Administered 2018-12-05 – 2018-12-08 (×4): 1 mg via INTRAVENOUS
  Filled 2018-12-05 (×4): qty 0.2

## 2018-12-05 MED ORDER — PIPERACILLIN-TAZOBACTAM 3.375 G IVPB
3.3750 g | Freq: Three times a day (TID) | INTRAVENOUS | Status: AC
  Administered 2018-12-05 – 2018-12-07 (×9): 3.375 g via INTRAVENOUS
  Filled 2018-12-05 (×9): qty 50

## 2018-12-05 MED ORDER — SODIUM CHLORIDE 0.9 % IV BOLUS
1000.0000 mL | Freq: Once | INTRAVENOUS | Status: AC
  Administered 2018-12-05: 1000 mL via INTRAVENOUS

## 2018-12-05 MED ORDER — PRO-STAT SUGAR FREE PO LIQD: 30.0000 mL | Freq: Two times a day (BID) | ORAL | Status: DC

## 2018-12-05 MED ORDER — SODIUM CHLORIDE 0.9 % IV BOLUS: 500.0000 mL | Freq: Once | INTRAVENOUS | Status: DC

## 2018-12-05 MED ORDER — PANTOPRAZOLE SODIUM 40 MG IV SOLR
40.0000 mg | Freq: Two times a day (BID) | INTRAVENOUS | Status: DC
  Administered 2018-12-05 – 2018-12-09 (×10): 40 mg via INTRAVENOUS
  Filled 2018-12-05 (×11): qty 40

## 2018-12-05 MED FILL — Medication: Qty: 1 | Status: AC

## 2018-12-05 NOTE — Progress Notes (Addendum)
NAME:  Mason Hart, MRN:  [id number], DOB:  07-28-1956, LOS: 1 ADMISSION DATE:  12/04/2018, CONSULTATION DATE:  12/04/18 REFERRING MD:  Lynelle Doctor  CHIEF COMPLAINT:  Seizures   Brief History   63 yo M with PMH seizure disorder presented to [facility] ED after having a seizure at home. He was last seen well around 3 PM on 1/8 and was subsequently found that evening with active status epilepticus. He was bradycardic with HR 30s EMS and required TC pacing. He was also found to be hypothermic (81*) though he was in his home when he had the seizure. He was not protecting his airway so he was intubated in the ED. His seizure broke with ativan but then he became completely unresponsive. He was loaded with keppra by neurology then his home dose was increased to 1 g BID. EEG showed diffuse generalized irregular slowing. He remained hypotensive despite propofol being stopped, he was started on levophed. ABG BMP with acute respiratory and metabolic acidosis with gap. ETOH level 14. K 3.1, mag 2.0. CT head showed no acute findings. He was initially started on propofol but then this was stopped by the bradycardia and QT prolongation.   Past Medical History  Alcohol use, seizures (hx of not compliant with antiepileptic meds)    Significant Hospital Events   1/8 admit   Consults:  Neurology   Procedures:  1/8 - intubated   Significant Diagnostic Tests:  1/8 - no acute intracranial changes, chronic microvascular ischemia   Micro Data:  Blood 1/8 > NGTD Resp 1/8 - GPC and gram variable rods, culture in process  MRSA neg   Antimicrobials:  Zosyn 1/9 >   Interim history/subjective:    Objective   Blood pressure (!) 78/45, pulse 94, temperature 99.1 F (37.3 C), resp. rate (!) 22, height 5\' 8"  (1.727 m), weight 90 kg, SpO2 100 %.    Vent Mode: PRVC FiO2 (%):  [40 %-100 %] 40 % Set Rate:  [14 bmp-16 bmp] 16 bmp Vt Set:  [500 mL-550 mL] 550 mL PEEP:  [5 cmH20] 5 cmH20 Plateau Pressure:  [15 cmH20-16 cmH20] 15  cmH20   Intake/Output Summary (Last 24 hours) at 12/05/2018 0820 Last data filed at 12/05/2018 0815 Gross per 24 hour  Intake 1660.82 ml  Output 125 ml  Net 1535.82 ml   Filed Weights   12/04/18 1945 12/05/18 0000 12/05/18 0500  Weight: 89.1 kg 89.1 kg 90 kg    Examination: General: ill appearing  HENT: vesicular rash over the right nasal bridge with swelling and erythema of the left eye Lungs: course rhonchi over the left lung field, regular rate and rhythm, no murmur  Cardiovascular: Regular rate and rhythm, no peripheral edema  Abdomen: sot, non tender, non distended  Extremities: no peripheral edema  Neuro: twitches but does not open eyes to voice, does not follow command  GU: external catheter in place   Resolved Hospital Problem list     Assessment & Plan:   Acute hypercarbic respiratory acidosis, inability to protect airway  At risk for apiration  AMS thought to be related to prolonged postictal state at this point, as his mentation improves anticipate that we should be able to wean him from the vent.  - daily WUA, SBT  - fentanyl infusion, PRN versed and fentanyl. Holding off on precedex for hypotension  -Continue treatment with zosyn for presumed aspiration. CXR from this morning does not yet show evidence of aspiration.  - follow up CXR tomorrow  Seizures, post ictal  - s/p keppra load, continue IV keppra 1000 mg BID this is an increase from his home keppra dose   Shock Persistent hypotension  Likely related to sepsis given hypotension at presentation that has persisted. Perfusing well and without peripheral edema which makes cardiogenic less likely but he is at risk of alcohol induced cardiomyopathy so we will obtain an Echo.  - s/p 2 L NS bolus, will provide an additional 1 L NS, then NS @ 125 cc/hr  - continue levophed, hopeful BP will improve with NS and we will be able to taper this off - no prior echo in our system, will check POC echo  Troponinemia     Initial trop 0.01 > < 0.03 > 0.04  - follow up Trop q6h until plateau  - EKG on admission with J point elevation in inferior leads - follow up repeat EKG   Bradycardia 2/2 hypothermia - resolved  Prolonged QTc Hypomagnesemia  Hypokalemia - resolved  - avoid QT prolonging meds  - monitor QT - replace hypomag, goal 2  - follow daily mag, K, phos   ? GI Bleed  NGT with bloody output, Hemoglobin was 11 at the time of admission yesterday and trended down to 9 this morning - check serial CBC q6h  - protonix IV 40 mg BID  - hold AC for DVT ppx   Vesicular rash  - monitor, low threshold to test for HSV if AMS persist  ETOH use  - CIWA monitoring  - start thiamine and folic acid  - follow daily mag, K, phos   Elevated transaminase  - AST elevated in a pattern consistent with ETOH use  - monitor daily LFTs  - no prior abdominal CT or ultrasound, low threshold to eval for cirrhosis if AMS persist   Chronic hyponatremia  ? Beer potomania and hypovolemia. Osmolalities and urine studies not yet obtained.  - continue to monitor, provide regular IV fluids avoid rapid overcorrection   Best practice:  Diet: NPO  Pain/Anxiety/Delirium protocol (if indicated): fentanyl infusion  VAP protocol (if indicated): yes  DVT prophylaxis: SCDs, holding AC for ? GIB  GI prophylaxis: IV protonix BID  Glucose control: CBG q 8 h  Mobility: bed rest Code Status: FULL  Family Communication: no family at bedside  Disposition:   Labs   CBC: Recent Labs  Lab 12/04/18 2012 12/05/18 0056  WBC 5.5 4.0  HGB 11.2* 9.3*  HCT 36.9* 29.2*  MCV 88.9 82.7  PLT 290 232    Basic Metabolic Panel: Recent Labs  Lab 12/04/18 2012 12/05/18 0056  NA 131* 131*  K 3.1* 3.2*  CL 100 103  CO2 16* 18*  GLUCOSE 88 86  BUN 15 14  CREATININE 0.77 0.79  CALCIUM 9.4 8.8*  MG 2.0 1.8  PHOS 4.6 3.7   GFR: Estimated Creatinine Clearance: 101.6 mL/min (by C-G formula based on SCr of 0.79 mg/dL). Recent  Labs  Lab 12/04/18 2012 12/04/18 2159 12/05/18 0056  PROCALCITON  --   --  <0.10  WBC 5.5  --  4.0  LATICACIDVEN  --  5.7* 2.9*    Liver Function Tests: Recent Labs  Lab 12/04/18 2012  AST 68*  ALT 27  ALKPHOS 79  BILITOT 0.6  PROT 7.6  ALBUMIN 3.1*   No results for input(s): LIPASE, AMYLASE in the last 168 hours. No results for input(s): AMMONIA in the last 168 hours.  ABG    Component Value Date/Time   PHART  7.438 12/05/2018 0349   PCO2ART 30.1 (L) 12/05/2018 0349   PO2ART 65.2 (L) 12/05/2018 0349   HCO3 20.5 12/05/2018 0349   TCO2 22 12/04/2018 2133   ACIDBASEDEF 3.4 (H) 12/05/2018 0349   O2SAT 93.5 12/05/2018 0349     Coagulation Profile: No results for input(s): INR, PROTIME in the last 168 hours.  Cardiac Enzymes: Recent Labs  Lab 12/04/18 2012 12/04/18 2159 12/05/18 0056  CKTOTAL 731*  --   --   TROPONINI  --  <0.03 <0.03    HbA1C: Hgb A1c MFr Bld  Date/Time Value Ref Range Status  12/09/2012 03:06 PM 4.5 (L) 4.6 - 6.5 % Final    Comment:    Glycemic Control Guidelines for People with Diabetes:Non Diabetic:  <6%Goal of Therapy: <7%Additional Action Suggested:  >8%     CBG: Recent Labs  Lab 12/04/18 2059 12/04/18 2131 12/04/18 2344  GLUCAP 67* 148* 83    Review of Systems:     Past Medical History  He,  has a past medical history of Noncompliance with medication regimen and Seizures (HCC).   Surgical History   History reviewed. No pertinent surgical history.   Social History   reports that he has never smoked. He has never used smokeless tobacco. He reports current alcohol use of about 7.0 standard drinks of alcohol per week. He reports that he does not use drugs.   Family History   His family history includes Heart disease in his mother. There is no history of Cancer, Diabetes, or Stroke.   Allergies No Known Allergies   Home Medications  Prior to Admission medications   Medication Sig Start Date End Date Taking? Authorizing  Provider  folic acid (FOLVITE) 1 MG tablet Take 1 tablet (1 mg total) by mouth daily. 11/20/18 12/20/18  Briant CedarEzenduka, Nkeiruka J, MD  levETIRAcetam (KEPPRA) 500 MG tablet Take 1 tablet (500 mg total) by mouth 2 (two) times daily. 11/19/18 12/19/18  Briant CedarEzenduka, Nkeiruka J, MD  Multiple Vitamin (MULTIVITAMIN WITH MINERALS) TABS tablet Take 1 tablet by mouth daily. 11/20/18   Briant CedarEzenduka, Nkeiruka J, MD  thiamine 100 MG tablet Take 1 tablet (100 mg total) by mouth daily. 11/20/18 12/20/18  Briant CedarEzenduka, Nkeiruka J, MD     Critical care time: **

## 2018-12-05 NOTE — Progress Notes (Signed)
Results for Mason Hart, Mason Hart (MRN [id number]) as of 12/05/2018 12:31  Ref. Range 12/05/2018 09:27  Troponin I Latest Ref Range: <0.03 ng/mL 0.04 (HH)  Reported to [place] at 9:30.  Results for Mason Hart, Mason Hart (MRN [id number]) as of 12/05/2018 12:45  Ref. Range 12/05/2018 11:45  Glucose-Capillary Latest Ref Range: 70 - 99 mg/dL 50 (L)  Order to follow

## 2018-12-05 NOTE — Progress Notes (Signed)
Results for JEMARI, HOARD (MRN [id number]) as of 12/05/2018 15:40  Ref. Range 12/05/2018 15:15  Glucose-Capillary Latest Ref Range: 70 - 99 mg/dL 56 (L)  Provider made aware

## 2018-12-05 NOTE — ED Notes (Signed)
Ativan 1mg  wasted with Carmie Kanner, RN.

## 2018-12-05 NOTE — Progress Notes (Signed)
Fentanyl 150 mls wasted with rachele p rn

## 2018-12-05 NOTE — Procedures (Signed)
History: 63 year old male intubated for status epilepticus, rule out ongoing nonconvulsive status  Sedation: Propofol 10 mcg/kg/min  Technique: This is a 21 channel routine scalp EEG performed at the bedside with bipolar and monopolar montages arranged in accordance to the international 10/20 system of electrode placement. One channel was dedicated to EKG recording.    Background: The background consists of diffuse irregular delta and theta activities.  There is no clear posterior dominant rhythm seen.  There is no epileptiform activity seen.  Photic stimulation: Physiologic driving is not performed  EEG Abnormalities: 1) diffuse generalized irregular slow activity 2) slow PDR  Clinical Interpretation: This EEG is consistent with a generalized nonspecific cerebral dysfunction (encephalopathy).  Please note that lack of epileptiform activity on EEG does not preclude the possibility of epilepsy.   Ritta Slot, MD [facility] [phone]  If 7pm- 7am, please page neurology on call as listed in AMION.

## 2018-12-05 NOTE — Progress Notes (Signed)
Reason for consult: Status epilepticus  Subjective: Intubated and on sedation.  He has not had any further clinical seizure activity.   ROS:  Unable to obtain due to poor mental status  Examination  Vital signs in last 24 hours: Temp:  [80.8 F (27.1 C)-100.2 F (37.9 C)] 98.4 F (36.9 C) (01/09 1600) Pulse Rate:  [53-102] 75 (01/09 1626) Resp:  [9-27] 21 (01/09 1626) BP: (54-133)/(41-89) 90/53 (01/09 1626) SpO2:  [94 %-100 %] 100 % (01/09 1626) FiO2 (%):  [40 %-100 %] 40 % (01/09 1626) Weight:  [89.1 kg-90 kg] 90 kg (01/09 0500)  General: lying in bed CVS: pulse-normal rate and rhythm RS: breathing comfortably Extremities: normal   Neuro: Intubated and sedated MS: eyes open, not following commands, tracks examiner CN: pupils equal and reactive, corneal reflex present. EOMI: tracks to both direction, no nystagmus, gaze deviation, face appears symmetric Motor: Withdraws in all 4 extremities Sensory: Unable to assess accurately due to mental status, withdraws to noxious stimuli Coordination and  gait: Unable to test  Basic Metabolic Panel: Recent Labs  Lab 12/04/18 2012 12/05/18 0056 12/05/18 0927 12/05/18 1551  NA 131* 131*  --  130*  K 3.1* 3.2* 3.6 3.7  CL 100 103  --  103  CO2 16* 18*  --  18*  GLUCOSE 88 86  --  108*  BUN 15 14  --  19  CREATININE 0.77 0.79  --  1.51*  CALCIUM 9.4 8.8*  --  7.9*  MG 2.0 1.8  --   --   PHOS 4.6 3.7  --  4.2    CBC: Recent Labs  Lab 12/04/18 2012 12/05/18 0056 12/05/18 1551  WBC 5.5 4.0 9.7  HGB 11.2* 9.3* 8.5*  HCT 36.9* 29.2* 26.7*  MCV 88.9 82.7 82.9  PLT 290 232 190     Coagulation Studies: No results for input(s): LABPROT, INR in the last 72 hours.  Imaging Reviewed:     ASSESSMENT AND PLAN 63 year old male with past medical history of seizures presented to the emergency department with status epilepticus.  On arrival, also found to be bradycardic requiring transfer cutaneous pacing, hypothermic and  hypotensive and intubated for airway protection. Patient was loaded with Keppra and started on 1 g twice daily for maintenance which was increased from his home dose.  Clear if patient was compliant with medications.  A stat EEG was obtained last night and patient was not in nonconvulsive status.  He has had no further clinical seizures since his admission in the ICU and they have been gradually weaning off sedation.    Status epilepticus-resolved Alcohol withdrawal Septic shock  Recommendations -Gradually wean sedation as possible, appreciate CCM assistance  -Continue Keppra 1 g twice daily -Continue seizure precautions -Continue to correct hyponatremia, treat because of her underlying sepsis   Berdina Cheever [facility] Pager Number [phone] For questions after 7pm please refer to AMION to reach the Neurologist on call

## 2018-12-05 NOTE — Progress Notes (Signed)
Initial Nutrition Assessment  DOCUMENTATION CODES:   Not applicable  INTERVENTION:    Vital AF 1.2 at 70 ml/h (1680 ml per day)  Provides 2016 kcal, 126 gm protein, 1362 ml free water daily  NUTRITION DIAGNOSIS:   Inadequate oral intake related to inability to eat as evidenced by NPO status.  GOAL:   Patient will meet greater than or equal to 90% of their needs  MONITOR:   Vent status, TF tolerance, Labs, Skin  REASON FOR ASSESSMENT:   Ventilator, Consult Enteral/tube feeding initiation and management  ASSESSMENT:   63 yo male with PMH of seizures, medication noncompliance, alcohol abuse who was admitted with seizure, hypothermia, severe bradycardia. Intubated on admission.  Discussed patient with MD and RN today. Received MD Consult for TF initiation and management.  Patient is currently intubated on ventilator support MV: 12.5 L/min Temp (24hrs), Avg:93.9 F (34.4 C), Min:80.8 F (27.1 C), Max:100.2 F (37.9 C)   Labs reviewed. Sodium 131 (L), potassium 3.2 (L) CBG's: 50-91-56 Medications reviewed and include folic acid, thiamine, Levophed.    NUTRITION - FOCUSED PHYSICAL EXAM:  unable to complete NFPE at this time  Diet Order:   Diet Order            Diet NPO time specified  Diet effective now              EDUCATION NEEDS:   No education needs have been identified at this time  Skin:  Skin Assessment: Skin Integrity Issues: Skin Integrity Issues:: Stage II Stage II: healing stage II to sacrum  Last BM:  1/8  Height:   Ht Readings from Last 1 Encounters:  12/05/18 5\' 8"  (1.727 m)    Weight:   Wt Readings from Last 1 Encounters:  12/05/18 90 kg   Admission weight 89.1 kg (BMI=29.9)  Ideal Body Weight:  70 kg  BMI:  Body mass index is 30.17 kg/m.  Estimated Nutritional Needs:   Kcal:  2100  Protein:  125-140 gm  Fluid:  2 L    Joaquin Courts, RD, LDN, CNSC Pager [phone] After Hours Pager [phone]

## 2018-12-05 NOTE — Progress Notes (Signed)
63 year old man with alcohol abuse and seizure disorder admitted 1/8 with seizure and hypothermia and severe bradycardia requiring transcutaneous pacing He was intubated for airway protection in the ED, rewarmed and developed severe hypotension requiring Levophed.  Propofol was discontinued but he remained hypotensive He was loaded with Keppra, EEG was negative  On exam-sedated on fentanyl drip , on 20 mics of Levophed, pupils equally reactive to light, no pallor, icterus, bloody aspirate in OG tube, clear breath sounds bilateral, S1-S2 normal, no edema.  Chest x-ray personally reviewed which shows mild bibasal atelectasis.  Labs show hyponatremia, mild hypokalemia, resolved lactate, negative procalcitonin, mild anemia. ABG shows double acidosis has resolved  Impression/plan  Seizures-could be related to alcohol withdrawal, EtOH level was 14, loaded with Keppra and would continue which is a home medication.  Acute encephalopathy-could be postictal at this point, discontinue propofol, okay to use low-dose fentanyl, if blood pressure permits can use Precedex  Hypotension-no obvious source of sepsis but given hypothermia will continue antibiotics, more likely related to rewarming, since this occurred with propofol, will obtain echo to rule out alcoholic cardiomyopathy, given another liter of normal saline and we were able to decrease Levophed to 5 mics so hopefully can taper off soon  Prolonged QT interval-improved from 0.6 2.5 and will monitor  Rule out GI bleed-more likely related to upper airway trauma but will obtain serial hemoglobin and watch OG aspirate  Acute respiratory failure-hopeful that we can extubate once mental status improves if he passes spontaneous breathing trial  Hyponatremia-appears chronic, related to EtOH  My independent critical care time x 21m  Cyril Mourning MD. FCCP. [facility] Pager [phone] If no response call [phone]    12/05/2018

## 2018-12-06 ENCOUNTER — Inpatient Hospital Stay (HOSPITAL_COMMUNITY): Payer: BC Managed Care – PPO

## 2018-12-06 DIAGNOSIS — R9431 Abnormal electrocardiogram [ECG] [EKG]: Secondary | ICD-10-CM

## 2018-12-06 LAB — CBC
HCT: 26.8 % — ABNORMAL LOW (ref 39.0–52.0)
HCT: 25.5 % — ABNORMAL LOW (ref 39.0–52.0)
HCT: 25.6 % — ABNORMAL LOW (ref 39.0–52.0)
Hemoglobin: 8.7 g/dL — ABNORMAL LOW (ref 13.0–17.0)
Hemoglobin: 8 g/dL — ABNORMAL LOW (ref 13.0–17.0)
Hemoglobin: 8.1 g/dL — ABNORMAL LOW (ref 13.0–17.0)
MCH: 26.9 pg (ref 26.0–34.0)
MCH: 26.2 pg (ref 26.0–34.0)
MCH: 26.9 pg (ref 26.0–34.0)
MCHC: 32.5 g/dL (ref 30.0–36.0)
MCHC: 31.4 g/dL (ref 30.0–36.0)
MCHC: 31.6 g/dL (ref 30.0–36.0)
MCV: 83 fL (ref 80.0–100.0)
MCV: 83.6 fL (ref 80.0–100.0)
MCV: 85 fL (ref 80.0–100.0)
NRBC: 0 % (ref 0.0–0.2)
Platelets: 214 10*3/uL (ref 150–400)
Platelets: 177 10*3/uL (ref 150–400)
Platelets: 176 10*3/uL (ref 150–400)
RBC: 3.23 MIL/uL — ABNORMAL LOW (ref 4.22–5.81)
RBC: 3.05 MIL/uL — ABNORMAL LOW (ref 4.22–5.81)
RBC: 3.01 MIL/uL — AB (ref 4.22–5.81)
RDW: 15.1 % (ref 11.5–15.5)
RDW: 15.1 % (ref 11.5–15.5)
RDW: 15.4 % (ref 11.5–15.5)
WBC: 10 10*3/uL (ref 4.0–10.5)
WBC: 7.2 10*3/uL (ref 4.0–10.5)
WBC: 6.8 10*3/uL (ref 4.0–10.5)
nRBC: 0 % (ref 0.0–0.2)
nRBC: 0.3 % — ABNORMAL HIGH (ref 0.0–0.2)

## 2018-12-06 LAB — CK: Total CK: 1447 U/L — ABNORMAL HIGH (ref 49–397)

## 2018-12-06 LAB — BASIC METABOLIC PANEL
Anion gap: 8 (ref 5–15)
BUN: 17 mg/dL (ref 8–23)
CHLORIDE: 105 mmol/L (ref 98–111)
CO2: 19 mmol/L — ABNORMAL LOW (ref 22–32)
Calcium: 8.1 mg/dL — ABNORMAL LOW (ref 8.9–10.3)
Creatinine, Ser: 1.36 mg/dL — ABNORMAL HIGH (ref 0.61–1.24)
GFR calc Af Amer: >60 mL/min (ref >60)
GFR calc non Af Amer: 55 mL/min — ABNORMAL LOW (ref >60)
Glucose, Bld: 96 mg/dL (ref 70–99)
POTASSIUM: 3.5 mmol/L (ref 3.5–5.1)
Sodium: 132 mmol/L — ABNORMAL LOW (ref 135–145)

## 2018-12-06 LAB — TROPONIN I: Troponin I: <0.03 ng/mL (ref <0.03)

## 2018-12-06 LAB — ECHOCARDIOGRAM COMPLETE
Height: 68 in
Weight: 3403.9 oz

## 2018-12-06 LAB — HEPATIC FUNCTION PANEL
ALT: 26 U/L (ref 0–44)
AST: 51 U/L — ABNORMAL HIGH (ref 15–41)
Albumin: 2.2 g/dL — ABNORMAL LOW (ref 3.5–5.0)
Alkaline Phosphatase: 47 U/L (ref 38–126)
BILIRUBIN TOTAL: 0.6 mg/dL (ref 0.3–1.2)
Bilirubin, Direct: <0.1 mg/dL (ref 0.0–0.2)
Total Protein: 5.9 g/dL — ABNORMAL LOW (ref 6.5–8.1)

## 2018-12-06 LAB — GLUCOSE, CAPILLARY
GLUCOSE-CAPILLARY: 100 mg/dL — AB (ref 70–99)
GLUCOSE-CAPILLARY: 94 mg/dL (ref 70–99)
Glucose-Capillary: 110 mg/dL — ABNORMAL HIGH (ref 70–99)
Glucose-Capillary: 86 mg/dL (ref 70–99)
Glucose-Capillary: 86 mg/dL (ref 70–99)
Glucose-Capillary: 81 mg/dL (ref 70–99)

## 2018-12-06 LAB — URINE CULTURE: Culture: NO GROWTH

## 2018-12-06 LAB — PROCALCITONIN: Procalcitonin: 1.05 ng/mL

## 2018-12-06 LAB — MAGNESIUM: Magnesium: 2 mg/dL (ref 1.7–2.4)

## 2018-12-06 LAB — PHOSPHORUS: Phosphorus: 4 mg/dL (ref 2.5–4.6)

## 2018-12-06 MED ORDER — ORAL CARE MOUTH RINSE
15.0000 mL | Freq: Two times a day (BID) | OROMUCOSAL | Status: DC
  Administered 2018-12-06 – 2018-12-18 (×20): 15 mL via OROMUCOSAL

## 2018-12-06 NOTE — Progress Notes (Addendum)
NAME:  Mason CarpenJohn Hart, MRN:  [id number], DOB:  [birth year], LOS: 2 ADMISSION DATE:  12/04/2018, CONSULTATION DATE:  12/04/18 REFERRING MD:  Lynelle DoctorKnapp  CHIEF COMPLAINT:  Seizures   Brief History   63 yo M with PMH seizure disorder presented to [facility] ED after having a seizure at home. He was last seen well around 3 PM on 1/8 and was subsequently found that evening with active status epilepticus. He was bradycardic with HR 30s EMS and required TC pacing. He was also found to be hypothermic (81*) though he was in his home when he had the seizure. He was not protecting his airway so he was intubated in the ED. His seizure broke with ativan but then he became completely unresponsive. He was loaded with keppra by neurology then his home dose was increased to 1 g BID. EEG showed diffuse generalized irregular slowing. He remained hypotensive despite propofol being stopped, he was started on levophed. ABG BMP with acute respiratory and metabolic acidosis with gap. ETOH level 14. K 3.1, mag 2.0, CK >700. CT head showed no acute findings. He was initially started on propofol but then this was stopped by the bradycardia and QT prolongation.   Past Medical History  Alcohol use, seizures (hx of not compliant with antiepileptic meds)    Significant Hospital Events   1/8 admit   Consults:  Neurology   Procedures:  1/8 - intubated   Significant Diagnostic Tests:  1/8 - no acute intracranial changes, chronic microvascular ischemia   Micro Data:  Blood 1/8 > NGTD Resp 1/8 - GPC and gram variable rods, culture in process  MRSA neg   Antimicrobials:  Zosyn 1/9 >   Interim history/subjective:   Persistent hypoglycemia overnight  Transitioned off of fentanyl gtt  Objective   Blood pressure 112/65, pulse 73, temperature 99 F (37.2 C), resp. rate 13, height 5\' 8"  (1.727 m), weight 96.5 kg, SpO2 100 %.    Vent Mode: CPAP;PSV FiO2 (%):  [40 %] 40 % Set Rate:  [16 bmp] 16 bmp Vt Set:  [550 mL] 550 mL PEEP:  [5  cmH20] 5 cmH20 Pressure Support:  [10 cmH20] 10 cmH20 Plateau Pressure:  [11 cmH20-18 cmH20] 14 cmH20   Intake/Output Summary (Last 24 hours) at 12/06/2018 [id number] Last data filed at 12/06/2018 0700 Gross per 24 hour  Intake 5258.76 ml  Output 2772 ml  Net 2486.76 ml   Filed Weights   12/05/18 0500 12/06/18 0045 12/06/18 0353  Weight: 90 kg 96.5 kg 96.5 kg    Examination: General: ill appearing  HENT: vesicular rash over the left nasal bridge with swelling and erythema of the left eye lid Lungs: lungs sound clear to auscultation, regular rate and rhythm, no murmur  Cardiovascular: Regular rate and rhythm, no peripheral edema  Abdomen: sot, non tender, non distended  Extremities: no peripheral edema  Neuro: opens eyes to voice and nods slightly, moves right fingers, does not move toes  GU: external catheter in place   Resolved Hospital Problem list     Assessment & Plan:   Acute hypercarbic respiratory acidosis, inability to protect airway  At risk for apiration  AMS thought to be related to prolonged postictal state, he was weaned off of fentanyl yesterday and has become more responsive with opening eyes but seems to have severe weakness in the extremities.  - daily WUA, SBT  - fentanyl infusion, PRN versed and fentanyl. Holding off on precedex for hypotension  -Continue treatment with zosyn for presumed aspiration.  CXR from this morning again doesn't show signs of aspiration  - follow up CXR tomorrow   Seizures, post ictal  - s/p keppra load, continue IV keppra 1000 mg BID this is an increase from his home keppra dose   Shock Persistent hypotension  Likely related to sepsis given hypotension at presentation that has persisted an dd he is in need of levophed.  - s/p 3.5 L  bolus, now on D5 NS @ 125 cc/hr, will bolus an additional 500 cc today  - levophed is at lowest rate, will attempt to transition off today  - echo in process   AKI  Rhabdomyolysis  Non gap metabolic  acidosis  - creatinine improving with IV fluids  - follow up repeat CK, continue to follow creatinine  - continue IV fluids   Troponinemia   Trop peaked at 0.04 then trended down  - EKG on admission with J point elevation in inferior leads, this resolved on follow up   Bradycardia 2/2 hypothermia - resolved  Prolonged QTc Hypomagnesemia Hypokalemia - resolved - avoid QT prolonging meds  - monitor QT - replace hypomag, goal 2  - follow daily mag, K, phos   ? GI Bleed  NGT with bloody output, Hemoglobin was 11 at the time of admission then trended down to 9, he was started on protonix IV 40 mg BID and todays hemoglobin is 8.7. No further bleeding noted from the NG or bowel.  - hold AC for DVT ppx  - continue to monitor CBC   Vesicular rash  - monitor, low threshold to test for HSV if AMS persist  ETOH use  - CIWA monitoring  - continue thiamine and folic acid  - follow daily mag, K, phos   Elevated transaminase  - AST elevated in a pattern consistent with ETOH use  - monitor daily LFTs  - no prior abdominal CT or ultrasound, low threshold to eval for cirrhosis if AMS persist   Chronic hyponatremia  ? Beer potomania and hypovolemia. Improving with IV fluids  - continue to correct slowly, avoid rapid overcorrection   Best practice:  Diet: NPO  Pain/Anxiety/Delirium protocol (if indicated): fentanyl infusion  VAP protocol (if indicated): yes  DVT prophylaxis: SCDs, holding AC for ? GIB  GI prophylaxis: IV protonix BID  Glucose control: CBG q 8 h  Mobility: bed rest Code Status: FULL  Family Communication: no family at bedside  Disposition:   Labs   CBC: Recent Labs  Lab 12/04/18 2012 12/05/18 0056 12/05/18 1551 12/06/18 0253  WBC 5.5 4.0 9.7 10.0  HGB 11.2* 9.3* 8.5* 8.7*  HCT 36.9* 29.2* 26.7* 26.8*  MCV 88.9 82.7 82.9 83.0  PLT 290 232 190 214    Basic Metabolic Panel: Recent Labs  Lab 12/04/18 2012 12/05/18 0056 12/05/18 0927 12/05/18 1551  12/06/18 0253  NA 131* 131*  --  130* 132*  K 3.1* 3.2* 3.6 3.7 3.5  CL 100 103  --  103 105  CO2 16* 18*  --  18* 19*  GLUCOSE 88 86  --  108* 96  BUN 15 14  --  19 17  CREATININE 0.77 0.79  --  1.51* 1.36*  CALCIUM 9.4 8.8*  --  7.9* 8.1*  MG 2.0 1.8  --   --  2.0  PHOS 4.6 3.7  --  4.2 4.0   GFR: Estimated Creatinine Clearance: 61.8 mL/min (A) (by C-G formula based on SCr of 1.36 mg/dL (H)). Recent Labs  Lab 12/04/18 2012  12/04/18 2159 12/05/18 0056 12/05/18 1551 12/06/18 0253  PROCALCITON  --   --  <0.10  --   --   WBC 5.5  --  4.0 9.7 10.0  LATICACIDVEN  --  5.7* 2.9*  --   --     Liver Function Tests: Recent Labs  Lab 12/04/18 2012 12/05/18 1551  AST 68*  --   ALT 27  --   ALKPHOS 79  --   BILITOT 0.6  --   PROT 7.6  --   ALBUMIN 3.1* 2.3*   No results for input(s): LIPASE, AMYLASE in the last 168 hours. No results for input(s): AMMONIA in the last 168 hours.  ABG    Component Value Date/Time   PHART 7.438 12/05/2018 0349   PCO2ART 30.1 (L) 12/05/2018 0349   PO2ART 65.2 (L) 12/05/2018 0349   HCO3 20.5 12/05/2018 0349   TCO2 22 12/04/2018 2133   ACIDBASEDEF 3.4 (H) 12/05/2018 0349   O2SAT 93.5 12/05/2018 0349     Coagulation Profile: No results for input(s): INR, PROTIME in the last 168 hours.  Cardiac Enzymes: Recent Labs  Lab 12/04/18 2012 12/04/18 2159 12/05/18 0056 12/05/18 0927 12/05/18 1551 12/06/18 0253  CKTOTAL 731*  --   --   --   --   --   TROPONINI  --  <0.03 <0.03 0.04* 0.03* <0.03    HbA1C: Hgb A1c MFr Bld  Date/Time Value Ref Range Status  12/09/2012 03:06 PM 4.5 (L) 4.6 - 6.5 % Final    Comment:    Glycemic Control Guidelines for People with Diabetes:Non Diabetic:  <6%Goal of Therapy: <7%Additional Action Suggested:  >8%     CBG: Recent Labs  Lab 12/05/18 1734 12/05/18 1915 12/05/18 2303 12/06/18 0344 12/06/18 0721  GLUCAP 93 75 85 100* 110*    Review of Systems:     Past Medical History  He,  has a past  medical history of Noncompliance with medication regimen and Seizures (HCC).   Surgical History   History reviewed. No pertinent surgical history.   Social History   reports that he has never smoked. He has never used smokeless tobacco. He reports current alcohol use of about 7.0 standard drinks of alcohol per week. He reports that he does not use drugs.   Family History   His family history includes Heart disease in his mother. There is no history of Cancer, Diabetes, or Stroke.   Allergies No Known Allergies   Home Medications  Prior to Admission medications   Medication Sig Start Date End Date Taking? Authorizing Provider  folic acid (FOLVITE) 1 MG tablet Take 1 tablet (1 mg total) by mouth daily. 11/20/18 12/20/18  Briant Cedar, MD  levETIRAcetam (KEPPRA) 500 MG tablet Take 1 tablet (500 mg total) by mouth 2 (two) times daily. 11/19/18 12/19/18  Briant Cedar, MD  Multiple Vitamin (MULTIVITAMIN WITH MINERALS) TABS tablet Take 1 tablet by mouth daily. 11/20/18   Briant Cedar, MD  thiamine 100 MG tablet Take 1 tablet (100 mg total) by mouth daily. 11/20/18 12/20/18  Briant Cedar, MD     Critical care time: **

## 2018-12-06 NOTE — Procedures (Signed)
Extubation Procedure Note  Patient Details:   Name: Mason Hart DOB: 1956/03/11 MRN: [id number]   Airway Documentation:    Vent end date: 12/06/18 Vent end time: 1113   Evaluation  O2 sats: stable throughout Complications: No apparent complications Patient did tolerate procedure well. Bilateral Breath Sounds: Diminished   Yes: patient was able to breathe around the ETT prior to extubation and speak post extubation.   Nell Range 12/06/2018, 11:14 AM

## 2018-12-06 NOTE — Progress Notes (Signed)
63 year old man with alcohol abuse and seizure disorder admitted 1/8 with seizure and hypothermia and severe bradycardia requiring transcutaneous pacing He was intubated for airway protection in the ED, rewarmed and developed severe hypotension requiring Levophed.  Propofol was discontinued but he remained hypotensive He was loaded with Keppra, EEG was negative  He was weaned off pressors, urine output improved. On exam-awake this morning, follows commands although cannot raise arms above shoulders, pupils BER TL, no more bloody aspirate and NG tube, clear breath sounds bilateral, no edema, S1-S2 normal.  Chest x-ray personally reviewed which shows mild bibasilar platelike atelectasis.  Labs show improved mild hyponatremia, negative troponins, no leukocytosis, CK 1400.  Impression/plan  Seizures-likely in the setting of alcohol withdrawal but has seizure disorder, can continue Keppra per neurology, EtOH level was 14 on admit.  Acute encephalopathy-resolved, Precedex can be tapered off.  Hypotension-no obvious source of sepsis, can discontinue Zosyn if cultures remain negative. Echo does not show cardiomyopathy. Levophed has been tapered to off. Prolonged QTC-decreased but remains prolonged  No evidence of further GI bleed although hemoglobin has dropped, will keep off subacute heparin and continue SCDs  Acute respiratory failure-he passed spontaneous breathing trial and was extubated and is tolerating well.  Hyponatremia-appears chronic and likely related to EtOH  If he remains well can transfer to stepdown and to [facility] 1/11  My independent critical care time x 228m  Saniya Tranchina V. Vassie LollAlva MD

## 2018-12-06 NOTE — Progress Notes (Signed)
  Echocardiogram 2D Echocardiogram has been performed.  Mason Hart M 12/06/2018, 8:28 AM

## 2018-12-06 NOTE — Progress Notes (Addendum)
Reason for consult: Status epilepticus  Subjective: Intubated, but following all commands. He has not had any further clinical seizure activity. No family at bedside   ROS:  Unable to obtain due to poor mental status  Examination  Vital signs in last 24 hours: Temp:  [97.2 F (36.2 C)-100.2 F (37.9 C)] 99 F (37.2 C) (01/10 0800) Pulse Rate:  [65-102] 74 (01/10 0800) Resp:  [0-27] 14 (01/10 0800) BP: (77-129)/(41-73) 123/64 (01/10 0800) SpO2:  [98 %-100 %] 100 % (01/10 0800) FiO2 (%):  [40 %] 40 % (01/10 0800) Weight:  [96.5 kg] 96.5 kg (01/10 0353)  General: lying in bed; no distress CVS: pulse-normal rate and rhythm RS: breathing comfortably Extremities: normal   Neuro: Intubated MS: Alert, eyes open, following commands, tracks examiner and attends. Seems to reliably shake head yes/no today.  CN: pupils equal and reactive, EOMI: tracks to both direction, no nystagmus, gaze deviation, face appears symmetric Motor: Moves all 4 extremities on command against gravity, gen and diffusely weak, but no focal defict  Sensory: shakes head "yes" when asked if he can feel LT in all ext.  Coordination and  gait: Unable to test  Basic Metabolic Panel: Recent Labs  Lab 12/04/18 2012 12/05/18 0056 12/05/18 0927 12/05/18 1551 12/06/18 0253  NA 131* 131*  --  130* 132*  K 3.1* 3.2* 3.6 3.7 3.5  CL 100 103  --  103 105  CO2 16* 18*  --  18* 19*  GLUCOSE 88 86  --  108* 96  BUN 15 14  --  19 17  CREATININE 0.77 0.79  --  1.51* 1.36*  CALCIUM 9.4 8.8*  --  7.9* 8.1*  MG 2.0 1.8  --   --  2.0  PHOS 4.6 3.7  --  4.2 4.0    CBC: Recent Labs  Lab 12/04/18 2012 12/05/18 0056 12/05/18 1551 12/06/18 0253  WBC 5.5 4.0 9.7 10.0  HGB 11.2* 9.3* 8.5* 8.7*  HCT 36.9* 29.2* 26.7* 26.8*  MCV 88.9 82.7 82.9 83.0  PLT 290 232 190 214     Coagulation Studies: No results for input(s): LABPROT, INR in the last 72 hours.  Imaging Reviewed:     ASSESSMENT AND PLAN 63 year old  male with past medical history of seizures presented to the emergency department with status epilepticus.  On arrival, also found to be bradycardic requiring transfer cutaneous pacing, hypothermic and hypotensive and intubated for airway protection. Patient was loaded with Keppra and started on 1 g twice daily for maintenance which was increased from his home dose. Its not clear if patient was compliant with medications.  A stat EEG was obtained and patient was not in nonconvulsive status.  He has had no further clinical seizures since his admission in the ICU and they have been gradually weaning off sedation.    Status epilepticus-resolved Alcohol withdrawal Septic shock  Recommendations -Now off sedation and following all commands; wean off vent as able -Continue Keppra 1 g twice daily -Continue seizure precautions; No driving -Continue to correct electrolytes, temp and underlying cause of sepsis -Continue Thiamine and CWIA protocol   Desiree Metzger-Cihelka ARNP-C, ANVP-BC [facility]  For questions after 7pm please refer to AMION to reach the Neurologist on call    NEUROHOSPITALIST ADDENDUM Performed a face to face diagnostic evaluation.   I have reviewed the contents of history and physical exam as documented by PA/ARNP/Resident and agree with above documentation.  I have discussed and formulated the above plan as documented.  Edits to the note have been made as needed.  Patient is awake and following commands, still intubated.  Has had no further seizures.  Many continue Keppra 1 g twice daily.  Continue thiamine and CIWA protocol.  No driving for 6 months.  Extubate when possible.    Georgiana Spinner Raya Mckinstry MD [facility] [phone]   If 7pm to 7am, please call on call as listed on AMION.

## 2018-12-07 ENCOUNTER — Inpatient Hospital Stay (HOSPITAL_COMMUNITY): Payer: BC Managed Care – PPO

## 2018-12-07 DIAGNOSIS — E876 Hypokalemia: Secondary | ICD-10-CM

## 2018-12-07 DIAGNOSIS — R001 Bradycardia, unspecified: Secondary | ICD-10-CM

## 2018-12-07 DIAGNOSIS — D649 Anemia, unspecified: Secondary | ICD-10-CM

## 2018-12-07 LAB — COMPREHENSIVE METABOLIC PANEL
ALT: 25 U/L (ref 0–44)
AST: 44 U/L — ABNORMAL HIGH (ref 15–41)
Albumin: 2.1 g/dL — ABNORMAL LOW (ref 3.5–5.0)
Alkaline Phosphatase: 44 U/L (ref 38–126)
Anion gap: 6 (ref 5–15)
BUN: 9 mg/dL (ref 8–23)
CO2: 22 mmol/L (ref 22–32)
Calcium: 8.5 mg/dL — ABNORMAL LOW (ref 8.9–10.3)
Chloride: 111 mmol/L (ref 98–111)
Creatinine, Ser: 0.91 mg/dL (ref 0.61–1.24)
GFR calc Af Amer: >60 mL/min (ref >60)
GFR calc non Af Amer: >60 mL/min (ref >60)
GLUCOSE: 124 mg/dL — AB (ref 70–99)
Potassium: 3.4 mmol/L — ABNORMAL LOW (ref 3.5–5.1)
Sodium: 139 mmol/L (ref 135–145)
TOTAL PROTEIN: 5.8 g/dL — AB (ref 6.5–8.1)
Total Bilirubin: 0.4 mg/dL (ref 0.3–1.2)

## 2018-12-07 LAB — GLUCOSE, CAPILLARY
Glucose-Capillary: 84 mg/dL (ref 70–99)
Glucose-Capillary: 95 mg/dL (ref 70–99)
Glucose-Capillary: 88 mg/dL (ref 70–99)
Glucose-Capillary: 77 mg/dL (ref 70–99)
Glucose-Capillary: 103 mg/dL — ABNORMAL HIGH (ref 70–99)

## 2018-12-07 LAB — CULTURE, RESPIRATORY W GRAM STAIN: Culture: NORMAL

## 2018-12-07 LAB — MAGNESIUM: Magnesium: 2.2 mg/dL (ref 1.7–2.4)

## 2018-12-07 LAB — PHOSPHORUS: Phosphorus: 3.2 mg/dL (ref 2.5–4.6)

## 2018-12-07 LAB — PROCALCITONIN: Procalcitonin: 0.11 ng/mL

## 2018-12-07 MED ORDER — POTASSIUM CHLORIDE 10 MEQ/100ML IV SOLN
10.0000 meq | INTRAVENOUS | Status: AC
  Administered 2018-12-07 (×2): 10 meq via INTRAVENOUS
  Filled 2018-12-07 (×2): qty 100

## 2018-12-07 NOTE — Evaluation (Signed)
Clinical/Bedside Swallow Evaluation Patient Details  Name: Mason Hart MRN: [id number] Date of Birth: 1956/03/05  Today's Date: 12/07/2018 Time: SLP Start Time (ACUTE ONLY): 0810 SLP Stop Time (ACUTE ONLY): 0830 SLP Time Calculation (min) (ACUTE ONLY): 20 min  Past Medical History:  Past Medical History:  Diagnosis Date  . Noncompliance with medication regimen   . Seizures (HCC)    Past Surgical History: History reviewed. No pertinent surgical history. HPI:  Mason Hart is a 63 y.o. male who has a PMH of seizures.   He presented to [facility] ED 1/8 after he had a seizure at his home.  He was apparently post ictal upon EMS arrival with HR in the 30's.  He also has a history of ETOH.  EMS was starting TC pacing, but HR improved.  Upon arrival to ED, he had another seizure which was treated with ativan.  He was later intubated for airway protection.  In ED, he was noted to be hypothermic to 13F (Per EMS, pt was not outside prior to or during seizure and was inside the home).  He was placed on a warming blanket.  Labs were noteable for K of 3.1, CK 731.  EKG showed QTc of 628.   He was seen by neurology and loaded with Keppra (is on Keppra as an outpatient).  PCCM was later called for ICU admission and intubated for airway protection.  Most recent chest xray is showing minimal left basilar subsegmental atelectasis.  Head CT was showing no acute findings but stable chronic microvascular ischemic changes and volume loss.     Assessment / Plan / Recommendation Clinical Impression  Clinical swallowing evaluation was completed using thin liquids via spoon sips only.  Cranial nerve exam was completed and generally unremarkable.  LIngual, labial, facial and jaw range of motion and strength were adequate.  Vocal volume was low and volitional as well as spontaneous cough were weak.  He presented with a possible pharyngeal dysphagia. He was noted to swallow 3 times per spoon sized bolus and have a wet, gurgly vocal  quality post swallow.  Given clinical presentation and reason for patient's admission recommend a MBS to determine current swallowing physiology and least restrictive diet.   SLP Visit Diagnosis: Dysphagia, unspecified (R13.10)    Aspiration Risk  Severe aspiration risk    Diet Recommendation   NPO pending results of MBS.  Medication Administration: Via alternative means    Other  Recommendations Oral Care Recommendations: Oral care QID   Follow up Recommendations Other (comment)(TBD)             Prognosis Prognosis for Safe Diet Advancement: Good      Swallow Study   General Date of Onset: 12/04/18 HPI: Mason Hart is a 63 y.o. male who has a PMH of seizures.   He presented to [facility] ED 1/8 after he had a seizure at his home.  He was apparently post ictal upon EMS arrival with HR in the 30's.  He also has a history of ETOH.  EMS was starting TC pacing, but HR improved.  Upon arrival to ED, he had another seizure which was treated with ativan.  He was later intubated for airway protection.  In ED, he was noted to be hypothermic to 13F (Per EMS, pt was not outside prior to or during seizure and was inside the home).  He was placed on a warming blanket.  Labs were noteable for K of 3.1, CK 731.  EKG showed QTc of 628.  He was seen by neurology and loaded with Keppra (is on Keppra as an outpatient).  PCCM was later called for ICU admission and intubated for airway protection.  Most recent chest xray is showing minimal left basilar subsegmental atelectasis.  Head CT was showing no acute findings but stable chronic microvascular ischemic changes and volume loss.   Type of Study: Bedside Swallow Evaluation Previous Swallow Assessment: None noted at [facility].   Diet Prior to this Study: NPO Temperature Spikes Noted: No History of Recent Intubation: Yes Length of Intubations (days): 2 days Date extubated: 12/06/18 Behavior/Cognition: Alert;Cooperative Oral Cavity Assessment: Dry Oral Care  Completed by SLP: No Patient Positioning: Upright in bed Baseline Vocal Quality: Aphonic;Low vocal intensity Volitional Cough: Weak Volitional Swallow: Able to elicit    Oral/Motor/Sensory Function Overall Oral Motor/Sensory Function: Within functional limits   Ice Chips Ice chips: Not tested   Thin Liquid Thin Liquid: Impaired Presentation: Spoon Pharyngeal  Phase Impairments: Multiple swallows;Wet Vocal Quality    Nectar Thick Nectar Thick Liquid: Not tested   Honey Thick Honey Thick Liquid: Not tested   Puree Puree: Not tested   Solid     Solid: Not tested     Mason Aguas, MA, CCC-SLP Acute Rehab SLP [phone]  Fleet Contras 12/07/2018,8:41 AM

## 2018-12-07 NOTE — Progress Notes (Signed)
PROGRESS NOTE    Mason Hart  ZOX:[id number] DOB: Oct 03, 1956 DOA: 12/04/2018 PCP: [name], [name]    Brief Narrative:  63 yo M with PMH seizure disorder presented to [facility] ED after having a seizure at home. He was last seen well around 3 PM on 1/8 and was subsequently found that evening with active status epilepticus. He was bradycardic with HR 30s EMS and required TC pacing. He was also found to be hypothermic (81*) though he was in his home when he had the seizure. He was not protecting his airway so he was intubated in the ED. His seizure broke with ativan but then he became completely unresponsive. He was loaded with keppra by neurology then his dose was increased to 1 g BID. EEG showed diffuse generalized irregular slowing. He remained hypotensive despite propofol being stopped, he was started on levophed. ABG BMP with acute respiratory and metabolic acidosis with gap. ETOH level 14. K 3.1, mag 2.0, CK >700. CT head showed no acute findings. He was initially started on propofol but then this was stopped by the bradycardia and QT prolongation.  Patient extubated, weaned off pressors transferred to hospitalist service.  Failed swallow evaluation.  Scheduled for barium swallow.   Assessment & Plan:   Principal Problem:   Seizures (HCC) Active Problems:   Hypokalemia   Hypoglycemia   Anemia, normocytic normochromic   Endotracheally intubated   Hypothermia   Hyponatremia   Postictal state (HCC)   Sinusitis   Abnormal CXR   Hypotension   Bradycardia   Acute hypercarbic respiratory acidosis, inability to protect airway  At risk for apiration  12/07/18: -Patient currently extubated.  Able to maintain his airway.  On oxygen by nasal cannula.  Altered mental status:  12/07/18: AMS thought to be related to prolonged postictal state, he was weaned off of fentanyl, currently oriented x2.  Severe weakness in the extremities.  -Continue treatment with zosyn for presumed aspiration.  CXR showing minimal left basilar subsegmental atelectasis. -If he remains stable and afebrile consider discontinuing the Zosyn tomorrow.   Seizures, post ictal  - s/p keppra load, continue IV keppra 1000 mg BID, as per neurology increase from his home keppra dose   Shock Persistent hypotension  Likely related to sepsis given hypotension at presentation that has persisted an dd he is in need of levophed.  12/07/18: - s/p 3.5 L  bolus, off levophed at this time. -Continue to monitor blood pressure closely.  AKI  Rhabdomyolysis  Non gap metabolic acidosis  12/07/2018 - creatinine improving with IV fluids  -CK level today 1447.   - continue IV fluids. Follow up repeat CK.  Troponinemia   Trop peaked at 0.04 then trended down  - EKG on admission with J point elevation in inferior leads, this resolved on follow up   Bradycardia 2/2 hypothermia - resolved  Prolonged QTc Hypomagnesemia Hypokalemia - resolved - avoid QT prolonging meds  - monitor QT - replace hypomag, goal 2  12/07/2018: Magnesium today 2.2.  Potassium low.  Replacement ordered.  ? GI Bleed  NGT previously with bloody output, Hemoglobin was 11 at the time of admission then trended down to 9, he was started on protonix IV 40 mg BID 12/07/2018: Hemoglobin 8.1.  No further evidence of bleeding. - continue to monitor CBC.  Stable we will start him on DVT prophylaxis with subcutaneous heparin.   ETOH use  - CIWA monitoring  - continue thiamine and folic acid  - follow electrolytes and replace  as needed  Elevated transaminase  - AST elevated in a pattern consistent with ETOH use  - monitor daily LFTs  - no prior abdominal CT or ultrasound, low threshold to eval for cirrhosis if AMS persist   Chronic hyponatremia  ? Beer potomania and hypovolemia. Improving with IV fluids    DVT prophylaxis: SCD Code Status: Full code Family Communication: No family at bedside Disposition Plan: Unknown at this  time   Consultants:   None  Procedures:   No  Antimicrobials:   Zosyn    Subjective: He is oriented x2 at the time of my examination.  Failed swallow evaluation.  For barium swallow today.  Having generalized weakness.  Objective: Vitals:   12/07/18 0600 12/07/18 0700 12/07/18 0704 12/07/18 0800  BP: 123/71 110/69  128/74  Pulse: 68 61  (!) 53  Resp: 11 11  (!) 0  Temp:   (!) 96.1 F (35.6 C)   TempSrc:   Axillary   SpO2: 100% 100%  100%  Weight:      Height:        Intake/Output Summary (Last 24 hours) at 12/07/2018 1007 Last data filed at 12/07/2018 0800 Gross per 24 hour  Intake 2848.12 ml  Output 1600 ml  Net 1248.12 ml   Filed Weights   12/06/18 0045 12/06/18 0353 12/07/18 0500  Weight: 96.5 kg 96.5 kg 95.6 kg    Examination:  General exam: Appears calm and comfortable  Respiratory system: Decreased breath sounds lower lobes, no rhonchi or wheezing heard at this time Cardiovascular system: S1 & S2 heard, RRR. No JVD, murmurs, rubs, gallops or clicks. No pedal edema. Gastrointestinal system: Abdomen is nondistended, soft and nontender. No organomegaly or masses felt. Normal bowel sounds heard. Central nervous system: Oriented x2, generalized weakness Extremities: No lower extremity edema, bilateral upper extremity swelling Skin: No rashes, lesions or ulcers Psychiatry: Judgement and insight appear normal. Mood & affect appropriate.     Data Reviewed: I have personally reviewed following labs and imaging studies  CBC: Recent Labs  Lab 12/05/18 0056 12/05/18 1551 12/06/18 0253 12/06/18 0946 12/06/18 1644  WBC 4.0 9.7 10.0 7.2 6.8  HGB 9.3* 8.5* 8.7* 8.0* 8.1*  HCT 29.2* 26.7* 26.8* 25.5* 25.6*  MCV 82.7 82.9 83.0 83.6 85.0  PLT 232 190 214 177 176   Basic Metabolic Panel: Recent Labs  Lab 12/04/18 2012 12/05/18 0056 12/05/18 0927 12/05/18 1551 12/06/18 0253 12/07/18 0749  NA 131* 131*  --  130* 132* 139  K 3.1* 3.2* 3.6 3.7 3.5 3.4*   CL 100 103  --  103 105 111  CO2 16* 18*  --  18* 19* 22  GLUCOSE 88 86  --  108* 96 124*  BUN 15 14  --  19 17 9   CREATININE 0.77 0.79  --  1.51* 1.36* 0.91  CALCIUM 9.4 8.8*  --  7.9* 8.1* 8.5*  MG 2.0 1.8  --   --  2.0 2.2  PHOS 4.6 3.7  --  4.2 4.0 3.2   GFR: Estimated Creatinine Clearance: 92 mL/min (by C-G formula based on SCr of 0.91 mg/dL). Liver Function Tests: Recent Labs  Lab 12/04/18 2012 12/05/18 1551 12/06/18 0946 12/07/18 0749  AST 68*  --  51* 44*  ALT 27  --  26 25  ALKPHOS 79  --  47 44  BILITOT 0.6  --  0.6 0.4  PROT 7.6  --  5.9* 5.8*  ALBUMIN 3.1* 2.3* 2.2* 2.1*   No  results for input(s): LIPASE, AMYLASE in the last 168 hours. No results for input(s): AMMONIA in the last 168 hours. Coagulation Profile: No results for input(s): INR, PROTIME in the last 168 hours. Cardiac Enzymes: Recent Labs  Lab 12/04/18 2012  12/05/18 0056 12/05/18 0927 12/05/18 1551 12/06/18 0253 12/06/18 0946  CKTOTAL 731*  --   --   --   --   --  1,447*  TROPONINI  --    < > <0.03 0.04* 0.03* <0.03 <0.03   < > = values in this interval not displayed.   BNP (last 3 results) No results for input(s): PROBNP in the last 8760 hours. HbA1C: No results for input(s): HGBA1C in the last 72 hours. CBG: Recent Labs  Lab 12/06/18 1515 12/06/18 1909 12/06/18 2306 12/07/18 0341 12/07/18 0702  GLUCAP 86 86 81 84 95   Lipid Profile: Recent Labs    12/04/18 2002  TRIG 22   Thyroid Function Tests: Recent Labs    12/04/18 2138  TSH 1.565   Anemia Panel: No results for input(s): VITAMINB12, FOLATE, FERRITIN, TIBC, IRON, RETICCTPCT in the last 72 hours. Sepsis Labs: Recent Labs  Lab 12/04/18 2159 12/05/18 0056 12/06/18 0253 12/07/18 0749  PROCALCITON  --  <0.10 1.05 0.11  LATICACIDVEN 5.7* 2.9*  --   --     Recent Results (from the past 240 hour(s))  Culture, blood (routine x 2)     Status: None (Preliminary result)   Collection Time: 12/04/18 10:09 PM  Result  Value Ref Range Status   Specimen Description BLOOD LEFT HAND  Final   Special Requests   Final    BOTTLES DRAWN AEROBIC ONLY Blood Culture results may not be optimal due to an inadequate volume of blood received in culture bottles   Culture   Final    NO GROWTH 3 DAYS Performed at [facility], [address] [place], [place] [address]    Report Status PENDING  Incomplete  Culture, blood (routine x 2)     Status: None (Preliminary result)   Collection Time: 12/04/18 10:09 PM  Result Value Ref Range Status   Specimen Description BLOOD RIGHT HAND  Final   Special Requests   Final    BOTTLES DRAWN AEROBIC AND ANAEROBIC Blood Culture adequate volume   Culture   Final    NO GROWTH 3 DAYS Performed at [facility], [address] [place], [place] [address]    Report Status PENDING  Incomplete  Urine culture     Status: None   Collection Time: 12/04/18 10:09 PM  Result Value Ref Range Status   Specimen Description URINE, RANDOM  Final   Special Requests NONE  Final   Culture   Final    NO GROWTH Performed at [facility], [address], [place], [place] [address]    Report Status 12/06/2018 FINAL  Final  Culture, respiratory (tracheal aspirate)     Status: None   Collection Time: 12/04/18 11:41 PM  Result Value Ref Range Status   Specimen Description TRACHEAL ASPIRATE  Final   Special Requests NONE  Final   Gram Stain   Final    ABUNDANT WBC PRESENT,BOTH PMN AND MONONUCLEAR ABUNDANT GRAM POSITIVE COCCI FEW GRAM VARIABLE ROD Performed at [facility], [address] [place], [place] [address]    Culture ABUNDANT Consistent with normal respiratory flora.  Final   Report Status 12/07/2018 FINAL  Final  MRSA PCR Screening  Status: None   Collection Time: 12/04/18 11:41 PM  Result Value Ref Range Status   MRSA by PCR NEGATIVE NEGATIVE Final    Comment:        The GeneXpert MRSA Assay (FDA approved for NASAL specimens only), is one component of  a comprehensive MRSA colonization surveillance program. It is not intended to diagnose MRSA infection nor to guide or monitor treatment for MRSA infections. Performed at [facility], [address] [place], [place] [address]          Radiology Studies: Dg Chest Port 1 View  Result Date: 12/07/2018 CLINICAL DATA:  Respiratory failure. EXAM: PORTABLE CHEST 1 VIEW COMPARISON:  Radiograph of December 06, 2018. FINDINGS: Stable cardiomediastinal silhouette. Endotracheal and nasogastric tubes have been removed. No pneumothorax or pleural effusion is noted. Right lung is clear. Minimal left basilar subsegmental atelectasis is noted. Bony thorax is unremarkable. IMPRESSION: Endotracheal and nasogastric tubes have been removed. Minimal left basilar subsegmental atelectasis. Electronically Signed   By: Lupita Raider, M.D.   On: 12/07/2018 08:10   Dg Chest Port 1 View  Result Date: 12/06/2018 CLINICAL DATA:  Respiratory failure. EXAM: PORTABLE CHEST 1 VIEW COMPARISON:  Radiographs of December 05, 2018. FINDINGS: Stable cardiomediastinal silhouette. Endotracheal and nasogastric tubes are unchanged in position. Right lung is clear. No pneumothorax or pleural effusion is noted. Mild left basilar atelectasis is noted. Bony thorax is unremarkable. IMPRESSION: Endotracheal and nasogastric tubes are unchanged in position. Mild left basilar subsegmental atelectasis. Electronically Signed   By: Lupita Raider, M.D.   On: 12/06/2018 07:24        Scheduled Meds: . folic acid  1 mg Intravenous Daily  . mouth rinse  15 mL Mouth Rinse BID  . pantoprazole (PROTONIX) IV  40 mg Intravenous Q12H  . thiamine injection  100 mg Intravenous Daily   Continuous Infusions: . sodium chloride 20 mL/hr at 12/07/18 0800  . dextrose 5 % and 0.9% NaCl 75 mL/hr at 12/07/18 0800  . feeding supplement (VITAL AF 1.2 CAL) Stopped (12/06/18 1057)  . levETIRAcetam Stopped (12/06/18 2326)  . norepinephrine (LEVOPHED)  Adult infusion Stopped (12/06/18 0926)  . piperacillin-tazobactam (ZOSYN)  IV 3.375 g (12/07/18 0821)  . sodium chloride Stopped (12/05/18 1327)     LOS: 3 days    Time spent: 35    Vonzella Nipple, MD [facility] Pager on amion  If 7PM-7AM, please contact night-coverage [email or web address] Password [email or web address] 12/07/2018, 10:07 AM

## 2018-12-07 NOTE — Evaluation (Signed)
Physical Therapy Evaluation Patient Details Name: Mason Hart MRN: [id number] DOB: 1956-08-12 Today's Date: 12/07/2018   History of Present Illness  63 yo male admitted admit 1/8 after seizure at home, intubated 1/8-1/10. PMH includes: bradycardia, AMS, anemia, hypoglycemia, hypokalemia, hypothermia, hyponatremia, ETOH abuse, R clav fracture     Clinical Impression  Pt admitted with above diagnosis. Pt currently with functional limitations due to the deficits listed below (see PT Problem List). Patient AOx4 but lethargic with slow processing unsure what true baseline is, reports he lives alone and walks with [facility] but is vague with details upon follow up questions. Today, requires max A to come to sitting, presents extremely deconditioned with multiple areas of skin break down suggesting he is not as mobile as he reports.  Unsafe to attempt standing tonight will need hoyer lift for chair transfer next visit. Pt will benefit from skilled PT to increase their independence and safety with mobility to allow discharge to the venue listed below.       Follow Up Recommendations SNF    Equipment Recommendations  Other (comment)    Recommendations for Other Services       Precautions / Restrictions Precautions Precautions: Fall Restrictions Weight Bearing Restrictions: No      Mobility  Bed Mobility Overal bed mobility: Needs Assistance       Supine to sit: Max assist;+2 for physical assistance Sit to supine: +2 for physical assistance;Max assist   General bed mobility comments: tolearting sitting on EOB for 10 minutes R lean noted. max A to bring to sitting. very weak   Transfers                    Ambulation/Gait                Stairs            Wheelchair Mobility    Modified Rankin (Stroke Patients Only)       Balance Overall balance assessment: Needs assistance Sitting-balance support: Feet supported;No upper extremity supported Sitting  balance-Leahy Scale: Poor       Standing balance-Leahy Scale: Zero                               Pertinent Vitals/Pain Pain Assessment: No/denies pain    Home Living Family/patient expects to be discharged to:: Skilled nursing facility Living Arrangements: Alone   Type of Home: House Home Access: Level entry     Home Layout: One level Home Equipment: Walker - 2 wheels      Prior Function           Comments: pt unrealiale historian today. reports he lives alone and walks with cane.      Hand Dominance   Dominant Hand: Left    Extremity/Trunk Assessment   Upper Extremity Assessment Upper Extremity Assessment: Generalized weakness(Limitations in ROM overhead. AROM to 60* at shoulder.)    Lower Extremity Assessment Lower Extremity Assessment: Generalized weakness       Communication   Communication: No difficulties  Cognition Arousal/Alertness: Awake/alert Behavior During Therapy: Flat affect Overall Cognitive Status: No family/caregiver present to determine baseline cognitive functioning Area of Impairment: Following commands;Memory;Attention;Awareness;Problem solving                     Memory: Decreased short-term memory   Safety/Judgement: Decreased awareness of deficits Awareness: Intellectual Problem Solving: Slow processing;Decreased initiation;Requires verbal cues;Requires tactile cues;Difficulty sequencing General Comments:  AOx4 but slow processing, confusion, unable to provide reliable history stating he doesnt know how he gets to grocery store etc.       General Comments      Exercises Other Exercises Other Exercises: PROM/AROM BUE all planes x10  Other Exercises: PROM/AAROM BLE, quad sets, ankle pumps, hip abduction. knee flexion. grossly week 2/5   Assessment/Plan    PT Assessment Patient needs continued PT services  PT Problem List Decreased strength;Decreased activity tolerance;Decreased range of motion;Decreased  balance;Decreased mobility;Decreased cognition;Decreased knowledge of use of DME;Decreased safety awareness;Pain       PT Treatment Interventions DME instruction;Gait training;Functional mobility training;Therapeutic activities;Therapeutic exercise;Balance training;Patient/family education    PT Goals (Current goals can be found in the Care Plan section)  Acute Rehab PT Goals PT Goal Formulation: With patient Time For Goal Achievement: 12/21/18 Potential to Achieve Goals: Fair    Frequency Min 2X/week   Barriers to discharge Decreased caregiver support      Co-evaluation               AM-PAC PT "6 Clicks" Mobility  Outcome Measure Help needed turning from your back to your side while in a flat bed without using bedrails?: A Lot Help needed moving from lying on your back to sitting on the side of a flat bed without using bedrails?: A Lot Help needed moving to and from a bed to a chair (including a wheelchair)?: Total Help needed standing up from a chair using your arms (e.g., wheelchair or bedside chair)?: Total Help needed to walk in hospital room?: Total Help needed climbing 3-5 steps with a railing? : Total 6 Click Score: 8    End of Session   Activity Tolerance: Patient limited by pain Patient left: in bed;with call bell/phone within reach;with bed alarm set Nurse Communication: Mobility status PT Visit Diagnosis: Other abnormalities of gait and mobility (R26.89);Muscle weakness (generalized) (M62.81);Pain    Time: 1730-1800 PT Time Calculation (min) (ACUTE ONLY): 30 min   Charges:   PT Evaluation $PT Eval Moderate Complexity: 1 Mod PT Treatments $Therapeutic Exercise: 8-22 mins       Etta Grandchild, PT, DPT Acute Rehabilitation Services Pager: [phone] Office: [phone]    Etta Grandchild 12/07/2018, 7:06 PM

## 2018-12-07 NOTE — Progress Notes (Signed)
  Modified Barium Swallow Progress Note  Patient Details  Name: Mason Hart MRN: [id number] Date of Birth: 1956-08-05  Today's Date: 12/07/2018  Modified Barium Swallow completed.  Full report located under Chart Review in the Imaging Section.  Brief recommendations include the following:  Clinical Impression  MBS was completed using thin liquids via spoon, cup and straw, nectar thick liquids via cup, pureed material and dual textured solids.  The patient presented with a moderate oropharyngeal dysphagia. The oral phase was marked by decreased lingual control and motion which lead to prematre loss of the bolus and delayed a/p transport of the bolus.  The patient was noted to have material fall into the buccal area but he was able to recollect most of this material so that there was not much oral residue seen.  Given the premature loss of the bolus material fell to the level of the pyriform sinuses and soemtimes into the laryngeal vestibule prior to the swallow trigger.  The pharyngeal phase was marked by intermittently decreased epiglottic movement and decreased base of tongue retraction.   These deficits lead to mild vallecula residue across textures.  Intermittent mild pyriform sinus residue was seen as well.   Transient penetration was seen given thin liquids via spoon, cup and straw as well as necter thick liquids via cup sip prior to the swallow.  This material was noted to clear the laryngeal vestibule.  Esophageal sweep did not reveal overt issues.  Patient with intermittent cough through exam without the presence of aspiration.  Recommend that the patient begin a dysphagia 1 diet with thin liquids.  ST will follow up for therapueutic diet tolerance and to initiate swallowing therapy as well as possible diet advancemenet.  He may benefit from ST therapy at the next level of care.     Swallow Evaluation Recommendations       SLP Diet Recommendations: Thin liquid;Dysphagia 1 (Puree) solids   Liquid Administration via: Cup;No straw   Medication Administration: Crushed with puree   Supervision: Staff to assist with self feeding   Compensations: Slow rate;Small sips/bites   Postural Changes: Seated upright at 90 degrees   Oral Care Recommendations: Oral care QID       Dimas Aguas, MA, CCC-SLP Acute Rehab SLP [phone]  Fleet Contras 12/07/2018,2:29 PM

## 2018-12-08 LAB — GLUCOSE, CAPILLARY
GLUCOSE-CAPILLARY: 108 mg/dL — AB (ref 70–99)
Glucose-Capillary: 66 mg/dL — ABNORMAL LOW (ref 70–99)
Glucose-Capillary: 67 mg/dL — ABNORMAL LOW (ref 70–99)
Glucose-Capillary: 78 mg/dL (ref 70–99)
Glucose-Capillary: 89 mg/dL (ref 70–99)
Glucose-Capillary: 91 mg/dL (ref 70–99)
Glucose-Capillary: 92 mg/dL (ref 70–99)
Glucose-Capillary: 94 mg/dL (ref 70–99)

## 2018-12-08 LAB — COMPREHENSIVE METABOLIC PANEL
ALBUMIN: 2.2 g/dL — AB (ref 3.5–5.0)
ALT: 30 U/L (ref 0–44)
AST: 42 U/L — ABNORMAL HIGH (ref 15–41)
Alkaline Phosphatase: 49 U/L (ref 38–126)
Anion gap: 6 (ref 5–15)
BUN: 7 mg/dL — AB (ref 8–23)
CO2: 20 mmol/L — ABNORMAL LOW (ref 22–32)
Calcium: 8.7 mg/dL — ABNORMAL LOW (ref 8.9–10.3)
Chloride: 112 mmol/L — ABNORMAL HIGH (ref 98–111)
Creatinine, Ser: 0.71 mg/dL (ref 0.61–1.24)
GFR calc Af Amer: >60 mL/min (ref >60)
GFR calc non Af Amer: >60 mL/min (ref >60)
GLUCOSE: 88 mg/dL (ref 70–99)
Potassium: 3.6 mmol/L (ref 3.5–5.1)
Sodium: 138 mmol/L (ref 135–145)
Total Bilirubin: 0.5 mg/dL (ref 0.3–1.2)
Total Protein: 6.3 g/dL — ABNORMAL LOW (ref 6.5–8.1)

## 2018-12-08 LAB — MAGNESIUM: Magnesium: 2 mg/dL (ref 1.7–2.4)

## 2018-12-08 LAB — CBC
HCT: 27 % — ABNORMAL LOW (ref 39.0–52.0)
Hemoglobin: 8.5 g/dL — ABNORMAL LOW (ref 13.0–17.0)
MCH: 27.3 pg (ref 26.0–34.0)
MCHC: 31.5 g/dL (ref 30.0–36.0)
MCV: 86.8 fL (ref 80.0–100.0)
Platelets: 162 10*3/uL (ref 150–400)
RBC: 3.11 MIL/uL — ABNORMAL LOW (ref 4.22–5.81)
RDW: 15.8 % — ABNORMAL HIGH (ref 11.5–15.5)
WBC: 4 10*3/uL (ref 4.0–10.5)
nRBC: 0 % (ref 0.0–0.2)

## 2018-12-08 LAB — PHOSPHORUS: Phosphorus: 3 mg/dL (ref 2.5–4.6)

## 2018-12-08 LAB — CK: Total CK: 459 U/L — ABNORMAL HIGH (ref 49–397)

## 2018-12-08 MED ORDER — HEPARIN SODIUM (PORCINE) 5000 UNIT/ML IJ SOLN
5000.0000 [IU] | Freq: Three times a day (TID) | INTRAMUSCULAR | Status: DC
  Administered 2018-12-08 – 2018-12-18 (×29): 5000 [IU] via SUBCUTANEOUS
  Filled 2018-12-08 (×30): qty 1

## 2018-12-08 MED ORDER — FOLIC ACID 1 MG PO TABS
1.0000 mg | ORAL_TABLET | Freq: Every day | ORAL | Status: DC
  Administered 2018-12-09 – 2018-12-18 (×10): 1 mg via ORAL
  Filled 2018-12-08 (×12): qty 1

## 2018-12-08 MED ORDER — POTASSIUM CHLORIDE CRYS ER 20 MEQ PO TBCR
20.0000 meq | EXTENDED_RELEASE_TABLET | Freq: Once | ORAL | Status: AC
  Administered 2018-12-08: 20 meq via ORAL
  Filled 2018-12-08: qty 1

## 2018-12-08 MED ORDER — VITAMIN B-1 100 MG PO TABS
100.0000 mg | ORAL_TABLET | Freq: Every day | ORAL | Status: DC
  Administered 2018-12-09 – 2018-12-18 (×10): 100 mg via ORAL
  Filled 2018-12-08 (×12): qty 1

## 2018-12-08 NOTE — Progress Notes (Signed)
Hypoglycemic Event  CBG: 66  Treatment: Pt ate meal.  Symptoms: None  Follow-up CBG: Time:2101 CBG Result:89  Possible Reasons for Event: Unknown  Comments/MD notified:   Theodosia Blender

## 2018-12-08 NOTE — Progress Notes (Signed)
PROGRESS NOTE    Mason Hart  ZOX:[id number] DOB: May 26, 1956 DOA: 12/04/2018 PCP: [name], [name]    Brief Narrative:  63 yo M with PMH seizure disorder presented to [facility] ED after having a seizure at home. He was last seen well around 3 PM on 1/8 and was subsequently found that evening with active status epilepticus. He was bradycardic with HR 30s EMS and required TC pacing. He was also found to be hypothermic (81*) though he was in his home when he had the seizure. He was not protecting his airway so he was intubated in the ED. His seizure broke with ativan but then he became completely unresponsive. He was loaded with keppra by neurology then his dose was increased to 1 g BID. EEG showed diffuse generalized irregular slowing. He remained hypotensive despite propofol being stopped, he was started on levophed. ABG BMP with acute respiratory and metabolic acidosis with gap. ETOH level 14. K 3.1, mag 2.0, CK >700. CT head showed no acute findings. He was initially started on propofol but then this was stopped by the bradycardia and QT prolongation.  Patient extubated, weaned off pressors transferred to hospitalist service.  Failed swallow evaluation.  Had barium swallow.  Per speech therapy the recommended pured, thin liquids.   Assessment & Plan:   Principal Problem:   Seizures (HCC) Active Problems:   Hypokalemia   Hypoglycemia   Anemia, normocytic normochromic   Endotracheally intubated   Hypothermia   Hyponatremia   Postictal state (HCC)   Sinusitis   Abnormal CXR   Hypotension   Bradycardia   Acute hypercarbic respiratory acidosis  At risk for apiration  12/07/18: -Patient currently extubated.  Able to maintain his airway.  On oxygen by nasal cannula. 12/08/2018: Patient remained stable on oxygen by nasal cannula.  Afebrile.  Altered mental status:  12/07/18: AMS thought to be related to prolonged postictal state, he was weaned off of fentanyl, currently oriented x2.   Severe weakness in the extremities.  -Continue treatment with zosyn for presumed aspiration. CXR showing minimal left basilar subsegmental atelectasis. -If he remains stable and afebrile consider discontinuing the Zosyn tomorrow. 12/08/2026: Zosyn discontinued.  Patient afebrile and stable at this time.  Currently oriented x3.  Seizures, post ictal  - s/p keppra load, continue IV keppra 1000 mg BID, as per neurology increase from his home keppra dose   Shock Persistent hypotension  Likely related to sepsis given hypotension at presentation that has persisted and was in need of levophed.  12/07/18: - s/p 3.5 L  bolus, off levophed at this time. 12/08/2018: Blood pressure stable at this time.  AKI  Rhabdomyolysis  Non gap metabolic acidosis  12/07/2018 - creatinine improving with IV fluids  -CK level today 1447.   - continue IV fluids. Follow up repeat CK. 12/08/2018: -CK improved to 459.  Troponinemia   Trop peaked at 0.04 then trended down  - EKG on admission with J point elevation in inferior leads, this resolved on follow up   Bradycardia 2/2 hypothermia - resolved  Prolonged QTc Hypomagnesemia Hypokalemia - resolved - avoid QT prolonging meds  - monitor QT - replace hypomag, goal 2  12/07/2018: Magnesium today 2.2.  Potassium low.  Replacement ordered. 12/08/2018: Potassium replacement ordered.  Magnesium level okay.  Mild bradycardia on occasion but overall stable.  ? GI Bleed  NGT previously with bloody output, Hemoglobin was 11 at the time of admission then trended down to 9, he was started on protonix IV 40  mg BID.  Now NG removed. 12/07/2018: Hemoglobin 8.1.  No further evidence of bleeding. - continue to monitor CBC.   12/08/2018: Hemoglobin today 8.5.  He is at high risk for DVT.  Will start him on DVT prophylaxis with subcutaneous heparin.   ETOH use  - CIWA monitoring  - continue thiamine and folic acid  - follow electrolytes and replace as  needed 12/08/2018: No signs of alcohol withdrawal.  Elevated transaminase  - AST elevated in a pattern consistent with ETOH use  12/08/2018: LFTs improving.  Chronic hyponatremia  ? Beer potomania and hypovolemia. Improved with IV fluids  12/08/2018: Sodium improved.  DVT prophylaxis: SCD Code Status: Full code Family Communication: No family at bedside Disposition Plan: SNF   Consultants:   None  Procedures:   No  Antimicrobials:   Zosyn discontinued    Subjective: He is oriented x3 at the time of my examination. Having generalized weakness.  Denies any other complaints at this time.  Objective: Vitals:   12/08/18 0500 12/08/18 0700 12/08/18 0702 12/08/18 0800  BP: 130/79 138/89  139/87  Pulse: 65 64  (!) 58  Resp: 17 10  13   Temp:   (!) 97.4 F (36.3 C)   TempSrc:   Oral   SpO2: 100% 100%  100%  Weight: 97.3 kg     Height:        Intake/Output Summary (Last 24 hours) at 12/08/2018 0955 Last data filed at 12/08/2018 0800 Gross per 24 hour  Intake 2074.34 ml  Output 750 ml  Net 1324.34 ml   Filed Weights   12/06/18 0353 12/07/18 0500 12/08/18 0500  Weight: 96.5 kg 95.6 kg 97.3 kg    Examination:  General exam: Appears calm and comfortable  Respiratory system: Decreased breath sounds lower lobes, occasional rhonchi, no wheezing heard at this time Cardiovascular system: S1 & S2 heard, RRR. No JVD, murmurs, rubs, gallops or clicks. mild pedal edema. Gastrointestinal system: Abdomen is nondistended, soft and nontender. No organomegaly or masses felt. Normal bowel sounds heard. Central nervous system: Oriented x3, generalized weakness Extremities: mild pedal edema, upper extremity swelling improving Skin: No rashes, lesions or ulcers Psychiatry: Judgement and insight appear normal. Mood & affect appropriate.     Data Reviewed: I have personally reviewed following labs and imaging studies  CBC: Recent Labs  Lab 12/05/18 1551 12/06/18 0253  12/06/18 0946 12/06/18 1644 12/08/18 0319  WBC 9.7 10.0 7.2 6.8 4.0  HGB 8.5* 8.7* 8.0* 8.1* 8.5*  HCT 26.7* 26.8* 25.5* 25.6* 27.0*  MCV 82.9 83.0 83.6 85.0 86.8  PLT 190 214 177 176 162   Basic Metabolic Panel: Recent Labs  Lab 12/04/18 2012 12/05/18 0056 12/05/18 0927 12/05/18 1551 12/06/18 0253 12/07/18 0749 12/08/18 0319  NA 131* 131*  --  130* 132* 139 138  K 3.1* 3.2* 3.6 3.7 3.5 3.4* 3.6  CL 100 103  --  103 105 111 112*  CO2 16* 18*  --  18* 19* 22 20*  GLUCOSE 88 86  --  108* 96 124* 88  BUN 15 14  --  19 17 9  7*  CREATININE 0.77 0.79  --  1.51* 1.36* 0.91 0.71  CALCIUM 9.4 8.8*  --  7.9* 8.1* 8.5* 8.7*  MG 2.0 1.8  --   --  2.0 2.2 2.0  PHOS 4.6 3.7  --  4.2 4.0 3.2 3.0   GFR: Estimated Creatinine Clearance: 105.6 mL/min (by C-G formula based on SCr of 0.71 mg/dL). Liver Function Tests:  Recent Labs  Lab 12/04/18 2012 12/05/18 1551 12/06/18 0946 12/07/18 0749 12/08/18 0319  AST 68*  --  51* 44* 42*  ALT 27  --  26 25 30   ALKPHOS 79  --  47 44 49  BILITOT 0.6  --  0.6 0.4 0.5  PROT 7.6  --  5.9* 5.8* 6.3*  ALBUMIN 3.1* 2.3* 2.2* 2.1* 2.2*   No results for input(s): LIPASE, AMYLASE in the last 168 hours. No results for input(s): AMMONIA in the last 168 hours. Coagulation Profile: No results for input(s): INR, PROTIME in the last 168 hours. Cardiac Enzymes: Recent Labs  Lab 12/04/18 2012  12/05/18 0056 12/05/18 0927 12/05/18 1551 12/06/18 0253 12/06/18 0946 12/08/18 0319  CKTOTAL 731*  --   --   --   --   --  1,447* 459*  TROPONINI  --    < > <0.03 0.04* 0.03* <0.03 <0.03  --    < > = values in this interval not displayed.   BNP (last 3 results) No results for input(s): PROBNP in the last 8760 hours. HbA1C: No results for input(s): HGBA1C in the last 72 hours. CBG: Recent Labs  Lab 12/07/18 2003 12/08/18 0030 12/08/18 0352 12/08/18 0701 12/08/18 0803  GLUCAP 103* 78 91 67* 108*   Lipid Profile: No results for input(s): CHOL, HDL,  LDLCALC, TRIG, CHOLHDL, LDLDIRECT in the last 72 hours. Thyroid Function Tests: No results for input(s): TSH, T4TOTAL, FREET4, T3FREE, THYROIDAB in the last 72 hours. Anemia Panel: No results for input(s): VITAMINB12, FOLATE, FERRITIN, TIBC, IRON, RETICCTPCT in the last 72 hours. Sepsis Labs: Recent Labs  Lab 12/04/18 2159 12/05/18 0056 12/06/18 0253 12/07/18 0749  PROCALCITON  --  <0.10 1.05 0.11  LATICACIDVEN 5.7* 2.9*  --   --     Recent Results (from the past 240 hour(s))  Culture, blood (routine x 2)     Status: None (Preliminary result)   Collection Time: 12/04/18 10:09 PM  Result Value Ref Range Status   Specimen Description BLOOD LEFT HAND  Final   Special Requests   Final    BOTTLES DRAWN AEROBIC ONLY Blood Culture results may not be optimal due to an inadequate volume of blood received in culture bottles   Culture   Final    NO GROWTH 4 DAYS Performed at [facility], [address], [place], [place] [address]    Report Status PENDING  Incomplete  Culture, blood (routine x 2)     Status: None (Preliminary result)   Collection Time: 12/04/18 10:09 PM  Result Value Ref Range Status   Specimen Description BLOOD RIGHT HAND  Final   Special Requests   Final    BOTTLES DRAWN AEROBIC AND ANAEROBIC Blood Culture adequate volume   Culture   Final    NO GROWTH 4 DAYS Performed at [facility], [address] [place], [place] [address]    Report Status PENDING  Incomplete  Urine culture     Status: None   Collection Time: 12/04/18 10:09 PM  Result Value Ref Range Status   Specimen Description URINE, RANDOM  Final   Special Requests NONE  Final   Culture   Final    NO GROWTH Performed at [facility], [address], [place], [place] [address]    Report Status 12/06/2018 FINAL  Final  Culture, respiratory (tracheal aspirate)     Status: None   Collection Time: 12/04/18 11:41 PM  Result Value Ref Range  Status   Specimen Description TRACHEAL ASPIRATE   Final   Special Requests NONE  Final   Gram Stain   Final    ABUNDANT WBC PRESENT,BOTH PMN AND MONONUCLEAR ABUNDANT GRAM POSITIVE COCCI FEW GRAM VARIABLE ROD Performed at [facility], [address] [place], [place] [address]    Culture ABUNDANT Consistent with normal respiratory flora.  Final   Report Status 12/07/2018 FINAL  Final  MRSA PCR Screening     Status: None   Collection Time: 12/04/18 11:41 PM  Result Value Ref Range Status   MRSA by PCR NEGATIVE NEGATIVE Final    Comment:        The GeneXpert MRSA Assay (FDA approved for NASAL specimens only), is one component of a comprehensive MRSA colonization surveillance program. It is not intended to diagnose MRSA infection nor to guide or monitor treatment for MRSA infections. Performed at [facility], [address], [place], [place] [address]          Radiology Studies: Dg Chest Port 1 View  Result Date: 12/07/2018 CLINICAL DATA:  Respiratory failure. EXAM: PORTABLE CHEST 1 VIEW COMPARISON:  Radiograph of December 06, 2018. FINDINGS: Stable cardiomediastinal silhouette. Endotracheal and nasogastric tubes have been removed. No pneumothorax or pleural effusion is noted. Right lung is clear. Minimal left basilar subsegmental atelectasis is noted. Bony thorax is unremarkable. IMPRESSION: Endotracheal and nasogastric tubes have been removed. Minimal left basilar subsegmental atelectasis. Electronically Signed   By: Lupita Raider, M.D.   On: 12/07/2018 08:10   Dg Swallowing Func-speech Pathology  Result Date: 12/07/2018 Objective Swallowing Evaluation: Type of Study: MBS-Modified Barium Swallow Study  Patient Details Name: Mason Hart MRN: [id number] Date of Birth: 10/04/1956 Today's Date: 12/07/2018 Time: SLP Start Time (ACUTE ONLY): 1345 -SLP Stop Time (ACUTE ONLY): 1415 SLP Time Calculation (min) (ACUTE ONLY): 30 min Past Medical History: Past Medical History: Diagnosis Date . Noncompliance with medication  regimen  . Seizures (HCC)  Past Surgical History: No past surgical history on file. HPI: Simeon Debes is a 63 y.o. male who has a PMH of seizures.   He presented to [facility] ED 1/8 after he had a seizure at his home.  He was apparently post ictal upon EMS arrival with HR in the 30's.  He also has a history of ETOH.  EMS was starting TC pacing, but HR improved.  Upon arrival to ED, he had another seizure which was treated with ativan.  He was later intubated for airway protection.  In ED, he was noted to be hypothermic to 67F (Per EMS, pt was not outside prior to or during seizure and was inside the home).  He was placed on a warming blanket.  Labs were noteable for K of 3.1, CK 731.  EKG showed QTc of 628.   He was seen by neurology and loaded with Keppra (is on Keppra as an outpatient).  PCCM was later called for ICU admission and intubated for airway protection.  Most recent chest xray is showing minimal left basilar subsegmental atelectasis.  Head CT was showing no acute findings but stable chronic microvascular ischemic changes and volume loss.   Subjective: The patient was seen in radiology for MBS. Assessment / Plan / Recommendation CHL IP CLINICAL IMPRESSIONS 12/07/2018 Clinical Impression MBS was completed using thin liquids via spoon, cup and straw, nectar thick liquids via cup, pureed material and dual textured solids.  The patient presented with a moderate oropharyngeal dysphagia. The oral phase was marked  by decreased lingual control and motion which lead to prematre loss of the bolus and delayed a/p transport of the bolus.  The patient was noted to have material fall into the buccal area but he was able to recollect most of this material so that there was not much oral residue seen.  Given the premature loss of the bolus material fell to the level of the pyriform sinuses and soemtimes into the laryngeal vestibule prior to the swallow trigger.  The pharyngeal phase was marked by intermittently decreased  epiglottic movement and decreased base of tongue retraction.   These deficits lead to mild vallecula residue across textures.  Intermittent mild pyriform sinus residue was seen as well.   Transient penetration was seen given thin liquids via spoon, cup and straw as well as necter thick liquids via cup sip prior to the swallow.  This material was noted to clear the laryngeal vestibule.  Esophageal sweep did not reveal overt issues.  Patient with intermittent cough through exam without the presence of aspiration.  Recommend that the patient begin a dysphagia 1 diet with thin liquids.  ST will follow up for therapueutic diet tolerance and to initiate swallowing therapy as well as possible diet advancemenet.  He may benefit from ST therapy at the next level of care.   SLP Visit Diagnosis Dysphagia, oropharyngeal phase (R13.12) Attention and concentration deficit following -- Frontal lobe and executive function deficit following -- Impact on safety and function Mild aspiration risk   CHL IP TREATMENT RECOMMENDATION 12/07/2018 Treatment Recommendations Therapy as outlined in treatment plan below   Prognosis 12/07/2018 Prognosis for Safe Diet Advancement Good Barriers to Reach Goals -- Barriers/Prognosis Comment -- CHL IP DIET RECOMMENDATION 12/07/2018 SLP Diet Recommendations Thin liquid;Dysphagia 1 (Puree) solids Liquid Administration via Cup;No straw Medication Administration Crushed with puree Compensations Slow rate;Small sips/bites Postural Changes Seated upright at 90 degrees   CHL IP OTHER RECOMMENDATIONS 12/07/2018 Recommended Consults -- Oral Care Recommendations Oral care QID Other Recommendations --   CHL IP FOLLOW UP RECOMMENDATIONS 12/07/2018 Follow up Recommendations Other (comment)   CHL IP FREQUENCY AND DURATION 12/07/2018 Speech Therapy Frequency (ACUTE ONLY) min 2x/week Treatment Duration 2 weeks      CHL IP ORAL PHASE 12/07/2018 Oral Phase Impaired Oral - Pudding Teaspoon -- Oral - Pudding Cup -- Oral - Honey  Teaspoon -- Oral - Honey Cup -- Oral - Nectar Teaspoon -- Oral - Nectar Cup Delayed oral transit;Decreased bolus cohesion;Premature spillage Oral - Nectar Straw -- Oral - Thin Teaspoon Decreased bolus cohesion;Premature spillage;Delayed oral transit Oral - Thin Cup Delayed oral transit;Decreased bolus cohesion;Premature spillage Oral - Thin Straw -- Oral - Puree Delayed oral transit;Decreased bolus cohesion;Premature spillage Oral - Mech Soft -- Oral - Regular -- Oral - Multi-Consistency Delayed oral transit;Decreased bolus cohesion;Premature spillage Oral - Pill -- Oral Phase - Comment --  CHL IP PHARYNGEAL PHASE 12/07/2018 Pharyngeal Phase Impaired Pharyngeal- Pudding Teaspoon -- Pharyngeal -- Pharyngeal- Pudding Cup -- Pharyngeal -- Pharyngeal- Honey Teaspoon -- Pharyngeal -- Pharyngeal- Honey Cup -- Pharyngeal -- Pharyngeal- Nectar Teaspoon -- Pharyngeal -- Pharyngeal- Nectar Cup Delayed swallow initiation-pyriform sinuses;Reduced tongue base retraction;Penetration/Aspiration before swallow;Pharyngeal residue - valleculae Pharyngeal Material enters airway, remains ABOVE vocal cords then ejected out Pharyngeal- Nectar Straw -- Pharyngeal -- Pharyngeal- Thin Teaspoon Delayed swallow initiation-pyriform sinuses;Reduced tongue base retraction;Reduced epiglottic inversion;Penetration/Aspiration before swallow;Pharyngeal residue - valleculae Pharyngeal Material enters airway, remains ABOVE vocal cords then ejected out Pharyngeal- Thin Cup Delayed swallow initiation-pyriform sinuses;Reduced tongue base retraction;Penetration/Aspiration before swallow;Pharyngeal residue -  valleculae Pharyngeal Material enters airway, remains ABOVE vocal cords then ejected out Pharyngeal- Thin Straw -- Pharyngeal -- Pharyngeal- Puree Delayed swallow initiation-pyriform sinuses;Reduced tongue base retraction;Pharyngeal residue - valleculae Pharyngeal -- Pharyngeal- Mechanical Soft -- Pharyngeal -- Pharyngeal- Regular -- Pharyngeal --  Pharyngeal- Multi-consistency Delayed swallow initiation-pyriform sinuses;Reduced tongue base retraction;Pharyngeal residue - valleculae Pharyngeal -- Pharyngeal- Pill -- Pharyngeal -- Pharyngeal Comment --  CHL IP CERVICAL ESOPHAGEAL PHASE 12/07/2018 Cervical Esophageal Phase WFL Pudding Teaspoon -- Pudding Cup -- Honey Teaspoon -- Honey Cup -- Nectar Teaspoon -- Nectar Cup -- Nectar Straw -- Thin Teaspoon -- Thin Cup -- Thin Straw -- Puree -- Mechanical Soft -- Regular -- Multi-consistency -- Pill -- Cervical Esophageal Comment -- Dimas Aguas, MA, CCC-SLP Acute Rehab SLP [phone] Fleet Contras 12/07/2018, 3:03 PM                   Scheduled Meds: . folic acid  1 mg Oral Daily  . mouth rinse  15 mL Mouth Rinse BID  . pantoprazole (PROTONIX) IV  40 mg Intravenous Q12H  . thiamine  100 mg Oral Daily   Continuous Infusions: . sodium chloride Stopped (12/08/18 0721)  . dextrose 5 % and 0.9% NaCl 75 mL/hr at 12/08/18 0800  . feeding supplement (VITAL AF 1.2 CAL) Stopped (12/06/18 1057)  . levETIRAcetam Stopped (12/07/18 2335)  . sodium chloride Stopped (12/05/18 1327)     LOS: 4 days    Time spent: 25    Vonzella Nipple, MD [facility] Pager on amion  If 7PM-7AM, please contact night-coverage [email or web address] Password [facility] 12/08/2018, 9:55 AM

## 2018-12-09 LAB — CULTURE, BLOOD (ROUTINE X 2)
CULTURE: NO GROWTH
Culture: NO GROWTH
Special Requests: ADEQUATE

## 2018-12-09 LAB — CBC
HEMATOCRIT: 32.6 % — AB (ref 39.0–52.0)
Hemoglobin: 9.9 g/dL — ABNORMAL LOW (ref 13.0–17.0)
MCH: 25.9 pg — ABNORMAL LOW (ref 26.0–34.0)
MCHC: 30.4 g/dL (ref 30.0–36.0)
MCV: 85.3 fL (ref 80.0–100.0)
NRBC: 0 % (ref 0.0–0.2)
Platelets: 200 10*3/uL (ref 150–400)
RBC: 3.82 MIL/uL — ABNORMAL LOW (ref 4.22–5.81)
RDW: 15.8 % — ABNORMAL HIGH (ref 11.5–15.5)
WBC: 4.4 10*3/uL (ref 4.0–10.5)

## 2018-12-09 LAB — COMPREHENSIVE METABOLIC PANEL
ALT: 28 U/L (ref 0–44)
AST: 31 U/L (ref 15–41)
Albumin: 2.2 g/dL — ABNORMAL LOW (ref 3.5–5.0)
Alkaline Phosphatase: 51 U/L (ref 38–126)
Anion gap: 6 (ref 5–15)
BUN: 8 mg/dL (ref 8–23)
CHLORIDE: 112 mmol/L — AB (ref 98–111)
CO2: 21 mmol/L — ABNORMAL LOW (ref 22–32)
CREATININE: 0.73 mg/dL (ref 0.61–1.24)
Calcium: 8.7 mg/dL — ABNORMAL LOW (ref 8.9–10.3)
GFR calc Af Amer: >60 mL/min (ref >60)
GFR calc non Af Amer: >60 mL/min (ref >60)
Glucose, Bld: 105 mg/dL — ABNORMAL HIGH (ref 70–99)
Potassium: 3.2 mmol/L — ABNORMAL LOW (ref 3.5–5.1)
Sodium: 139 mmol/L (ref 135–145)
Total Bilirubin: 0.7 mg/dL (ref 0.3–1.2)
Total Protein: 6.4 g/dL — ABNORMAL LOW (ref 6.5–8.1)

## 2018-12-09 LAB — GLUCOSE, CAPILLARY
Glucose-Capillary: 100 mg/dL — ABNORMAL HIGH (ref 70–99)
Glucose-Capillary: 103 mg/dL — ABNORMAL HIGH (ref 70–99)
Glucose-Capillary: 85 mg/dL (ref 70–99)
Glucose-Capillary: 88 mg/dL (ref 70–99)
Glucose-Capillary: 98 mg/dL (ref 70–99)

## 2018-12-09 LAB — CK: Total CK: 224 U/L (ref 49–397)

## 2018-12-09 LAB — PHOSPHORUS: Phosphorus: 3.2 mg/dL (ref 2.5–4.6)

## 2018-12-09 LAB — MAGNESIUM: Magnesium: 1.8 mg/dL (ref 1.7–2.4)

## 2018-12-09 MED ORDER — FENTANYL CITRATE (PF) 100 MCG/2ML IJ SOLN: 25.0000 ug | INTRAMUSCULAR | Status: DC | PRN

## 2018-12-09 MED ORDER — ACETAMINOPHEN 325 MG PO TABS
650.0000 mg | ORAL_TABLET | Freq: Four times a day (QID) | ORAL | Status: DC | PRN
  Administered 2018-12-15 – 2018-12-18 (×3): 650 mg via ORAL
  Filled 2018-12-09 (×3): qty 2

## 2018-12-09 MED ORDER — POTASSIUM CHLORIDE CRYS ER 20 MEQ PO TBCR
20.0000 meq | EXTENDED_RELEASE_TABLET | Freq: Two times a day (BID) | ORAL | Status: AC
  Administered 2018-12-09 – 2018-12-10 (×3): 20 meq via ORAL
  Filled 2018-12-09 (×3): qty 1

## 2018-12-09 MED ORDER — FUROSEMIDE 10 MG/ML IJ SOLN
40.0000 mg | Freq: Once | INTRAMUSCULAR | Status: AC
  Administered 2018-12-09: 40 mg via INTRAVENOUS
  Filled 2018-12-09: qty 4

## 2018-12-09 MED ORDER — CLONIDINE HCL 0.1 MG PO TABS
0.1000 mg | ORAL_TABLET | Freq: Three times a day (TID) | ORAL | Status: DC
  Administered 2018-12-09 – 2018-12-12 (×12): 0.1 mg via ORAL
  Filled 2018-12-09 (×14): qty 1

## 2018-12-09 NOTE — NC FL2 (Signed)
[place] MEDICAID FL2 LEVEL OF CARE SCREENING TOOL     IDENTIFICATION  Patient Name: Mason Hart Birthdate: 1956/05/07 Sex: male Admission Date (Current Location): 12/04/2018  [facility] and [place] Number:  Producer, television/film/video and Address:  [facility], [address], [place], [place] [address]      Provider Number: [id number]  Attending Physician Name and Address:  Lonia Blood, MD  Relative Name and Phone Number:  [name], Wife, [phone] and [name], [name], [phone]    Current Level of Care: Hospital Recommended Level of Care: Skilled Nursing Facility Prior Approval Number: [id number]  Date Approved/Denied: 11/18/18 PASRR Number:    Discharge Plan: SNF    Current Diagnoses: Patient Active Problem List   Diagnosis Date Noted  . Anemia, normocytic normochromic 12/05/2018  . Endotracheally intubated 12/05/2018  . Hypothermia 12/05/2018  . Hyponatremia 12/05/2018  . Postictal state (HCC) 12/05/2018  . Sinusitis 12/05/2018  . Abnormal CXR 12/05/2018  . Hypotension 12/05/2018  . Bradycardia 12/05/2018  . ARF (acute renal failure) (HCC) 11/14/2018  . Clavicle fracture 11/14/2018  . Hypokalemia 06/24/2017  . Hypoglycemia 06/24/2017  . Alcohol use 06/24/2017  . Seizure (HCC) 06/24/2017  . Noncompliance with medication regimen   . Seizures (HCC)     Orientation RESPIRATION BLADDER Height & Weight     Self, Time, Situation, Place  Normal Continent Weight: 219 lb 2.2 oz (99.4 kg) Height:  5\' 8"  (172.7 cm)  BEHAVIORAL SYMPTOMS/MOOD NEUROLOGICAL BOWEL NUTRITION STATUS    Convulsions/Seizures Incontinent(Has rectal tube) Diet(Diet DYS 1, thin liquids)  AMBULATORY STATUS COMMUNICATION OF NEEDS Skin   Limited Assist Verbally Normal, Skin abrasions, Other (Comment)(Has a dry skin, skin tear on buttocks, knee, and foot. Also has a pressure injury ulcer, open and has dressing in place)                       Personal  Care Assistance Level of Assistance  Bathing, Feeding, Dressing Bathing Assistance: Limited assistance Feeding assistance: Limited assistance Dressing Assistance: Limited assistance Total Care Assistance: Limited assistance   Functional Limitations Info  Sight, Hearing, Speech Sight Info: Impaired(Has edema in left eye) Hearing Info: Adequate Speech Info: Adequate    SPECIAL CARE FACTORS FREQUENCY  PT (By licensed PT), OT (By licensed OT), Speech therapy     PT Frequency: 5x/wk OT Frequency: 5x/wk     Speech Therapy Frequency: 5x/wk      Contractures Contractures Info: Not present    Additional Factors Info  Code Status, Allergies Code Status Info: Full Code Allergies Info: No Known Allergies           Current Medications (12/09/2018):  This is the current hospital active medication list Current Facility-Administered Medications  Medication Dose Route Frequency Provider Last Rate Last Dose  . 0.9 %  sodium chloride infusion  250 mL Intravenous Continuous Desai, Rahul P, PA-C   Stopped at 12/08/18 5188  . acetaminophen (TYLENOL) tablet 650 mg  650 mg Oral Q6H PRN Jetty Duhamel T, MD      . cloNIDine (CATAPRES) tablet 0.1 mg  0.1 mg Oral TID Lonia Blood, MD   0.1 mg at 12/09/18 1016  . fentaNYL (SUBLIMAZE) injection 25-50 mcg  25-50 mcg Intravenous Q2H PRN Lonia Blood, MD      . folic acid (FOLVITE) tablet 1 mg  1 mg Oral Daily Rumbarger, Faye Ramsay, RPH   1 mg at 12/09/18 0909  . heparin injection 5,000  Units  5,000 Units Subcutaneous Q8H Matcha, Anupama, MD   5,000 Units at 12/09/18 0556  . levETIRAcetam (KEPPRA) IVPB 1000 mg/100 mL premix  1,000 mg Intravenous Q12H Rejeana Brock, MD   Stopped at 12/09/18 0040  . MEDLINE mouth rinse  15 mL Mouth Rinse BID Oretha Milch, MD   15 mL at 12/08/18 0946  . pantoprazole (PROTONIX) injection 40 mg  40 mg Intravenous Q12H Oretha Milch, MD   40 mg at 12/09/18 0909  . potassium chloride SA (K-DUR,KLOR-CON)  CR tablet 20 mEq  20 mEq Oral BID Lonia Blood, MD   20 mEq at 12/09/18 1016  . thiamine (VITAMIN B-1) tablet 100 mg  100 mg Oral Daily Rumbarger, Faye Ramsay, RPH   100 mg at 12/09/18 6962     Discharge Medications: Please see discharge summary for a list of discharge medications.  Relevant Imaging Results:  Relevant Lab Results:   Additional Information SSN: [id number]  Nada Boozer [name], LCSWA

## 2018-12-09 NOTE — Progress Notes (Signed)
[place] TEAM 1 - Stepdown/ICU TEAM  Mason Hart  OIZ:[id number] DOB: July 09, 1956 DOA: 12/04/2018 PCP: [name], [name]    Brief Narrative:  63 yo M w/ a hx of seizure disorder who presented to [facility] ED after having a seizure at home. He was last seen well around 3 PM on 1/8 and was subsequently found that evening with active status epilepticus. He was bradycardic with HR 30s and required TC pacing. He was also found to be hypothermic (81*) though he was in his home when he had the seizure. He was not protecting his airway so he was intubated in the ED. His seizure broke with ativan but then he became completely unresponsive. He was loaded with keppra by Neurology then his dose was increased to 1 g BID. EEG showed diffuse generalized irregular slowing. He remained hypotensive despite propofol being stopped, he was started on levophed. ABG BMP with acute respiratory and metabolic acidosis with gap. ETOH level 14. K 3.1, mag 2.0, CK >700. CT head showed no acute findings. He was initially started on propofol but then this was stopped by the bradycardia and QT prolongation.   Patient extubated, weaned off pressors transferred to hospitalist service.  Failed swallow evaluation.  Had barium swallow.  Per speech therapy the recommended pured, thin liquids.  Subjective: The patient is awake and interactive though somewhat slow to respond.  He denies chest pain shortness of breath fever chills nausea or vomiting.  He recalls that he had a seizure but does not remember specific events regarding the time of his admission.  Assessment & Plan:  Seizures, post ictal  s/p keppra load - continue keppra at increased dose of 1000 mg BID as per Neurology - etiology felt to be noncompliance w/ home meds   Acute hypercarbic respiratory acidosis - presumed aspiration  Extubated and able to maintain his airway - has completed a course of Zosyn - no sign of active infection presently   Altered mental  status related to prolonged postictal state - appears to be slowly recovering   Shock - Persistent hypotension  Required levophed - now off pressors - resolved w/ pt now hypertensive   AKI Resolved w/ volume expansion  Rhabdomyolysis  CK has improved to normal range w/ volume expansion   Bradycardia 2/2 hypothermia resolved   Prolonged QTc Monitoring on tele   Hypokalemia  Supplement to goal of 4.0  ?GI Bleed  NGT previously with bloody output - Hgb 11 at admit- no further evidence of bleeding  ETOH use  no signs of alcohol withdrawal  Elevated transaminase  elevated in pattern consistent with ETOH use - LFTs now normal   Chronic hyponatremia  ?beer potomania and hypovolemia - Na has normalized with IV fluids  DVT prophylaxis: SQ heparin  Code Status: FULL CODE Family Communication: no family present at time of exam  Disposition Plan: stable for SDU bed  Consultants:  Neurology  PCCM  Antimicrobials:  None presently   Objective: Blood pressure (!) 163/87, pulse 74, temperature 97.9 F (36.6 C), temperature source Oral, resp. rate 15, height 5\' 8"  (1.727 m), weight 99.4 kg, SpO2 100 %.  Intake/Output Summary (Last 24 hours) at 12/09/2018 0900 Last data filed at 12/09/2018 0700 Gross per 24 hour  Intake 1962.19 ml  Output -  Net 1962.19 ml   Filed Weights   12/07/18 0500 12/08/18 0500 12/09/18 0432  Weight: 95.6 kg 97.3 kg 99.4 kg    Examination: General: No acute respiratory distress Lungs: Clear  to auscultation bilaterally without wheezes or crackles Cardiovascular: Regular rate and rhythm without murmur gallop or rub normal S1 and S2 Abdomen: Nontender, nondistended, soft, bowel sounds positive, no rebound, no ascites, no appreciable mass Extremities: No significant cyanosis, clubbing, or edema bilateral lower extremities  CBC: Recent Labs  Lab 12/06/18 1644 12/08/18 0319 12/09/18 0240  WBC 6.8 4.0 4.4  HGB 8.1* 8.5* 9.9*  HCT 25.6*  27.0* 32.6*  MCV 85.0 86.8 85.3  PLT 176 162 200   Basic Metabolic Panel: Recent Labs  Lab 12/07/18 0749 12/08/18 0319 12/09/18 0240  NA 139 138 139  K 3.4* 3.6 3.2*  CL 111 112* 112*  CO2 22 20* 21*  GLUCOSE 124* 88 105*  BUN 9 7* 8  CREATININE 0.91 0.71 0.73  CALCIUM 8.5* 8.7* 8.7*  MG 2.2 2.0 1.8  PHOS 3.2 3.0 3.2   GFR: Estimated Creatinine Clearance: 106.6 mL/min (by C-G formula based on SCr of 0.73 mg/dL).  Liver Function Tests: Recent Labs  Lab 12/06/18 0946 12/07/18 0749 12/08/18 0319 12/09/18 0240  AST 51* 44* 42* 31  ALT 26 25 30 28   ALKPHOS 47 44 49 51  BILITOT 0.6 0.4 0.5 0.7  PROT 5.9* 5.8* 6.3* 6.4*  ALBUMIN 2.2* 2.1* 2.2* 2.2*    Cardiac Enzymes: Recent Labs  Lab 12/04/18 2012  12/05/18 0056 12/05/18 0927 12/05/18 1551 12/06/18 0253 12/06/18 0946 12/08/18 0319 12/09/18 0240  CKTOTAL 731*  --   --   --   --   --  1,447* 459* 224  TROPONINI  --    < > <0.03 0.04* 0.03* <0.03 <0.03  --   --    < > = values in this interval not displayed.     Recent Results (from the past 240 hour(s))  Culture, blood (routine x 2)     Status: None   Collection Time: 12/04/18 10:09 PM  Result Value Ref Range Status   Specimen Description BLOOD LEFT HAND  Final   Special Requests   Final    BOTTLES DRAWN AEROBIC ONLY Blood Culture results may not be optimal due to an inadequate volume of blood received in culture bottles   Culture   Final    NO GROWTH 5 DAYS Performed at [facility], [address], [place], [place] [address]    Report Status 12/09/2018 FINAL  Final  Culture, blood (routine x 2)     Status: None   Collection Time: 12/04/18 10:09 PM  Result Value Ref Range Status   Specimen Description BLOOD RIGHT HAND  Final   Special Requests   Final    BOTTLES DRAWN AEROBIC AND ANAEROBIC Blood Culture adequate volume   Culture   Final    NO GROWTH 5 DAYS Performed at [facility], [address] [place], [place] [address]    Report  Status 12/09/2018 FINAL  Final  Urine culture     Status: None   Collection Time: 12/04/18 10:09 PM  Result Value Ref Range Status   Specimen Description URINE, RANDOM  Final   Special Requests NONE  Final   Culture   Final    NO GROWTH Performed at [facility], [address] [place], [place] [address]    Report Status 12/06/2018 FINAL  Final  Culture, respiratory (tracheal aspirate)     Status: None   Collection Time: 12/04/18 11:41 PM  Result Value Ref Range Status   Specimen Description TRACHEAL ASPIRATE  Final  Special Requests NONE  Final   Gram Stain   Final    ABUNDANT WBC PRESENT,BOTH PMN AND MONONUCLEAR ABUNDANT GRAM POSITIVE COCCI FEW GRAM VARIABLE ROD Performed at [facility], [address] [place], [place] [address]    Culture ABUNDANT Consistent with normal respiratory flora.  Final   Report Status 12/07/2018 FINAL  Final  MRSA PCR Screening     Status: None   Collection Time: 12/04/18 11:41 PM  Result Value Ref Range Status   MRSA by PCR NEGATIVE NEGATIVE Final    Comment:        The GeneXpert MRSA Assay (FDA approved for NASAL specimens only), is one component of a comprehensive MRSA colonization surveillance program. It is not intended to diagnose MRSA infection nor to guide or monitor treatment for MRSA infections. Performed at [facility], [address] [place], [place] [address]      Scheduled Meds: . folic acid  1 mg Oral Daily  . heparin injection (subcutaneous)  5,000 Units Subcutaneous Q8H  . mouth rinse  15 mL Mouth Rinse BID  . pantoprazole (PROTONIX) IV  40 mg Intravenous Q12H  . thiamine  100 mg Oral Daily   Continuous Infusions: . sodium chloride Stopped (12/08/18 0721)  . dextrose 5 % and 0.9% NaCl 75 mL/hr at 12/09/18 0700  . feeding supplement (VITAL AF 1.2 CAL) Stopped (12/06/18 1057)  . levETIRAcetam Stopped (12/09/18 0040)  . sodium chloride Stopped (12/05/18 1327)     LOS: 5 days   Lonia BloodJeffrey T.  McClung, MD [facility] Office  [phone] Pager - Text Page per Amion  If 7PM-7AM, please contact night-coverage per Amion 12/09/2018, 9:00 AM

## 2018-12-09 NOTE — Care Management Note (Signed)
Case Management Note  Patient Details  Name: Mason Hart MRN: [id number] Date of Birth: 28-Apr-1956  Subjective/Objective:   Pt is a readmit with seizures  Action/Plan:  PTA from home with [facility] provided by [facility]; HHRN, OT, PT and aide.   Pt recommended for SNF - CSW following  Expected Discharge Date:                  Expected Discharge Plan:  Skilled Nursing Facility  In-House Referral:     Discharge planning Services  CM Consult  Post Acute Care Choice:    Choice offered to:    DME Arranged:  DME Agency:      HH Arranged:    HH Agency:     Status of Service:  In process, will continue to follow  If discussed at Long Length of Stay Meetings, dates discussed:    Additional Comments:  Cherylann Parr, RN 12/09/2018, 4:19 PM

## 2018-12-09 NOTE — Progress Notes (Signed)
CSW met with patient. Patient was asleep prior to me coming into the room. Agreeable to SNF asked to come back tomorrow to discuss more and will need bed offers.   CSW will continue to follow.   Domenic Schwab, MSW, [place]

## 2018-12-10 LAB — BASIC METABOLIC PANEL
Anion gap: 6 (ref 5–15)
BUN: 8 mg/dL (ref 8–23)
CO2: 23 mmol/L (ref 22–32)
Calcium: 8.6 mg/dL — ABNORMAL LOW (ref 8.9–10.3)
Chloride: 110 mmol/L (ref 98–111)
Creatinine, Ser: 0.84 mg/dL (ref 0.61–1.24)
GFR calc Af Amer: >60 mL/min (ref >60)
GFR calc non Af Amer: >60 mL/min (ref >60)
Glucose, Bld: 84 mg/dL (ref 70–99)
Potassium: 3.4 mmol/L — ABNORMAL LOW (ref 3.5–5.1)
Sodium: 139 mmol/L (ref 135–145)

## 2018-12-10 LAB — GLUCOSE, CAPILLARY
GLUCOSE-CAPILLARY: 67 mg/dL — AB (ref 70–99)
Glucose-Capillary: 77 mg/dL (ref 70–99)
Glucose-Capillary: 76 mg/dL (ref 70–99)
Glucose-Capillary: 96 mg/dL (ref 70–99)

## 2018-12-10 LAB — CBC
HCT: 28.7 % — ABNORMAL LOW (ref 39.0–52.0)
Hemoglobin: 8.7 g/dL — ABNORMAL LOW (ref 13.0–17.0)
MCH: 26 pg (ref 26.0–34.0)
MCHC: 30.3 g/dL (ref 30.0–36.0)
MCV: 85.9 fL (ref 80.0–100.0)
Platelets: 162 10*3/uL (ref 150–400)
RBC: 3.34 MIL/uL — ABNORMAL LOW (ref 4.22–5.81)
RDW: 15.7 % — ABNORMAL HIGH (ref 11.5–15.5)
WBC: 4.9 10*3/uL (ref 4.0–10.5)
nRBC: 0 % (ref 0.0–0.2)

## 2018-12-10 LAB — MAGNESIUM: Magnesium: 1.6 mg/dL — ABNORMAL LOW (ref 1.7–2.4)

## 2018-12-10 MED ORDER — LEVETIRACETAM 500 MG PO TABS
1000.0000 mg | ORAL_TABLET | Freq: Two times a day (BID) | ORAL | Status: DC
  Administered 2018-12-10 – 2018-12-18 (×16): 1000 mg via ORAL
  Filled 2018-12-10 (×17): qty 2

## 2018-12-10 MED ORDER — TAMSULOSIN HCL 0.4 MG PO CAPS
0.4000 mg | ORAL_CAPSULE | Freq: Every day | ORAL | Status: DC
  Administered 2018-12-10 – 2018-12-18 (×9): 0.4 mg via ORAL
  Filled 2018-12-10 (×9): qty 1

## 2018-12-10 MED ORDER — KCL IN DEXTROSE-NACL 20-5-0.45 MEQ/L-%-% IV SOLN
INTRAVENOUS | Status: DC
  Administered 2018-12-10: 18:00:00 via INTRAVENOUS
  Filled 2018-12-10 (×2): qty 1000

## 2018-12-10 MED ORDER — POTASSIUM CHLORIDE CRYS ER 20 MEQ PO TBCR
40.0000 meq | EXTENDED_RELEASE_TABLET | Freq: Once | ORAL | Status: AC
  Administered 2018-12-10: 40 meq via ORAL
  Filled 2018-12-10: qty 2

## 2018-12-10 MED ORDER — PANTOPRAZOLE SODIUM 40 MG PO TBEC
40.0000 mg | DELAYED_RELEASE_TABLET | Freq: Two times a day (BID) | ORAL | Status: DC
  Administered 2018-12-10 – 2018-12-14 (×9): 40 mg via ORAL
  Filled 2018-12-10 (×8): qty 1

## 2018-12-10 MED ORDER — MAGNESIUM SULFATE 2 GM/50ML IV SOLN
2.0000 g | Freq: Once | INTRAVENOUS | Status: AC
  Administered 2018-12-10: 2 g via INTRAVENOUS
  Filled 2018-12-10: qty 50

## 2018-12-10 MED ORDER — ENSURE ENLIVE PO LIQD: 237.0000 mL | Freq: Three times a day (TID) | ORAL | Status: DC | PRN

## 2018-12-10 NOTE — Progress Notes (Signed)
  Speech Language Pathology Treatment: Dysphagia  Patient Details Name: Mason Hart MRN: [id number] DOB: May 04, 1956 Today's Date: 12/10/2018 Time: 2706-2376 SLP Time Calculation (min) (ACUTE ONLY): 12 min  Assessment / Plan / Recommendation Clinical Impression  Pt was seen for skilled ST targeting dysphagia goals.  Pt was repositioned to sitting upright in bed upon therapist's arrival to maximize safety with PO intake.  BUE remain very swollen, precluding self feeding of anything other than trials of thin liquids via straw.  Pt consumed straw sips without overt s/s of aspiration despite large consecutive boluses.  Given the presence of transient penetration across liquids administration methods on MBS, would recommend advancing pt to straw sips to facilitate improved ability for self feeding.  SLP also facilitated the session with trials of dys 2 and dys 3 textures to continue working towards diet progression.  Nursing reports adequate intake of purees without difficulty despite not liking pureed textures.  During trials, pt demonstrated appropriate mastication and clearance of solids from the oral cavity with mod I.  No overt s/s of aspiration with advanced solids or with mixed solid and liquid textures.  Therefore, recommend advancing pt's solids to dys 3 textures with full staff support for self feeding until edema subsides.  Pt left in bed with all needs within reach.  Continue per current plan of care.   HPI HPI: Coden Mason Hart is a 63 y.o. male who has a PMH of seizures.   He presented to [facility] ED 1/8 after he had a seizure at his home.  He was apparently post ictal upon EMS arrival with HR in the 30's.  He also has a history of ETOH.  EMS was starting TC pacing, but HR improved.  Upon arrival to ED, he had another seizure which was treated with ativan.  He was later intubated for airway protection.  In ED, he was noted to be hypothermic to 57F (Per EMS, pt was not outside prior to or during seizure and  was inside the home).  He was placed on a warming blanket.  Labs were noteable for K of 3.1, CK 731.  EKG showed QTc of 628.   He was seen by neurology and loaded with Keppra (is on Keppra as an outpatient).  PCCM was later called for ICU admission and intubated for airway protection.  Most recent chest xray is showing minimal left basilar subsegmental atelectasis.  Head CT was showing no acute findings but stable chronic microvascular ischemic changes and volume loss.        SLP Plan  Continue with current plan of care       Recommendations  Diet recommendations: Dysphagia 3 (mechanical soft);Thin liquid Liquids provided via: Cup;Straw Medication Administration: Crushed with puree Supervision: Staff to assist with self feeding Compensations: Minimize environmental distractions;Slow rate;Small sips/bites Postural Changes and/or Swallow Maneuvers: Seated upright 90 degrees;Upright 30-60 min after meal                Oral Care Recommendations: Oral care BID Follow up Recommendations: Skilled Nursing facility SLP Visit Diagnosis: Dysphagia, oropharyngeal phase (R13.12) Plan: Continue with current plan of care       GO                PageMelanee Spry 12/10/2018, 3:14 PM

## 2018-12-10 NOTE — Evaluation (Signed)
Occupational Therapy Evaluation Patient Details Name: Mason Hart MRN: [id number] DOB: June 20, 1956 Today's Date: 12/10/2018    History of Present Illness 63 yo male admitted admit 1/8 after seizure at home, intubated 1/8-1/10. PMH includes: bradycardia, AMS, anemia, hypoglycemia, hypokalemia, hypothermia, hyponatremia, ETOH abuse, R clav fracture   Clinical Impression   PT admitted with seizure and requiring intubation. Pt currently with functional limitiations due to the deficits listed below (see OT problem list). Pt currently with edema in all extremities and decr function grasp for bil UE. Pt requires max (A) from [facility] and total (A) for peri care due to UE deficits.  Pt will benefit from skilled OT to increase their independence and safety with adls and balance to allow discharge SNF.     Follow Up Recommendations  SNF    Equipment Recommendations  3 in 1 bedside commode;Other (comment)(RW)    Recommendations for Other Services       Precautions / Restrictions Precautions Precautions: Fall Restrictions Weight Bearing Restrictions: No      Mobility Bed Mobility Overal bed mobility: Needs Assistance Bed Mobility: Sit to Supine       Sit to supine: +2 for physical assistance;Mod assist   General bed mobility comments: pt require (A) to descend with trunk and pt elevate bil le onto the bed surface pt with deficits liftingthe LLE > R LE  Transfers Overall transfer level: Needs assistance Equipment used: Rolling walker (2 wheeled) Transfers: Sit to/from Stand Sit to Stand: +2 physical assistance;Mod assist         General transfer comment: pt was able to power up from the [facility] max (A). pt required total +2 (A) after prolonged sitting in the chair at elevated surface with L LE fully extended    Balance Overall balance assessment: Needs assistance Sitting-balance support: Feet supported Sitting balance-Leahy Scale: Fair       Standing balance-Leahy Scale:  Fair Standing balance comment: reliant on RW for (A)                           ADL either performed or assessed with clinical judgement   ADL Overall ADL's : Needs assistance/impaired Eating/Feeding: Minimal assistance;Bed level Eating/Feeding Details (indicate cue type and reason): hand over hand to support the cup due to UE edema Grooming: Moderate assistance   Upper Body Bathing: Maximal assistance   Lower Body Bathing: Total assistance   Upper Body Dressing : Maximal assistance   Lower Body Dressing: Total assistance   Toilet Transfer: Maximal assistance Toilet Transfer Details (indicate cue type and reason): requies mod cues for hand placement and to powerup . pt initiates but with posterior lean Toileting- Clothing Manipulation and Hygiene: Total assistance       Functional mobility during ADLs: Moderate assistance;Rolling walker General ADL Comments: pt requires (A) to power up from surface      Vision Baseline Vision/History: Wears glasses Wears Glasses: (does not wear them due to lens came out of the frame)       Perception     Praxis      Pertinent Vitals/Pain Pain Assessment: No/denies pain Faces Pain Scale: Hurts whole lot Pain Location: buttock Pain Descriptors / Indicators: Grimacing Pain Intervention(s): Monitored during session;Repositioned     Hand Dominance Right   Extremity/Trunk Assessment Upper Extremity Assessment Upper Extremity Assessment: RUE deficits/detail;LUE deficits/detail RUE Deficits / Details: Edema noted throughout the entire UE. pt with shoulder flexion 20 degrees, decr grasp due to edema LUE  Deficits / Details: edema noted throughout the entire UE, hand to move AROM, unabel to completed shoulder flexion.  LUE Coordination: decreased fine motor;decreased gross motor   Lower Extremity Assessment Lower Extremity Assessment: Defer to PT evaluation       Communication Communication Communication: No difficulties    Cognition Arousal/Alertness: Awake/alert Behavior During Therapy: Flat affect Overall Cognitive Status: No family/caregiver present to determine baseline cognitive functioning                       Memory: Decreased short-term memory   Safety/Judgement: Decreased awareness of safety Awareness: Intellectual Problem Solving: Slow processing;Difficulty sequencing General Comments: oriented x4, pt slow to process information   General Comments  sacral wound noted    Exercises     Shoulder Instructions      Home Living Family/patient expects to be discharged to:: Skilled nursing facility     Type of Home: House Home Access: Stairs to enter     Home Layout: One level     Bathroom Shower/Tub: Other (comment)(sponge bath )         Home Equipment: Walker - 2 wheels   Additional Comments: was living by himself until wife's friend came back and been staying at the house for 4- 5weeks. not reliable help. Does not drive. rides the bus for transportation      Prior Functioning/Environment Level of Independence: Independent with assistive device(s)        Comments: reports walks with cane and brought it with him         OT Problem List: Decreased strength;Decreased activity tolerance;Impaired balance (sitting and/or standing);Decreased safety awareness;Decreased knowledge of precautions;Decreased knowledge of use of DME or AE;Pain;Increased edema;Impaired UE functional use;Decreased cognition;Decreased coordination;Decreased range of motion      OT Treatment/Interventions: Self-care/ADL training;Therapeutic exercise;Neuromuscular education;DME and/or AE instruction;Energy conservation;Modalities;Therapeutic activities;Patient/family education;Balance training;Cognitive remediation/compensation    OT Goals(Current goals can be found in the care plan section) Acute Rehab OT Goals Patient Stated Goal: none stated OT Goal Formulation: With patient Time For Goal  Achievement: 12/24/18 Potential to Achieve Goals: Good  OT Frequency: Min 2X/week   Barriers to D/C: Decreased caregiver support  reports lives alone and takes the bus       Co-evaluation PT/OT/SLP Co-Evaluation/Treatment: Yes Reason for Co-Treatment: Complexity of the patient's impairments (multi-system involvement);Necessary to address cognition/behavior during functional activity;For patient/therapist safety;To address functional/ADL transfers   OT goals addressed during session: ADL's and self-care;Proper use of Adaptive equipment and DME      AM-PAC OT "6 Clicks" Daily Activity     Outcome Measure Help from another person eating meals?: A Lot Help from another person taking care of personal grooming?: A Lot Help from another person toileting, which includes using toliet, bedpan, or urinal?: Total Help from another person bathing (including washing, rinsing, drying)?: Total Help from another person to put on and taking off regular upper body clothing?: A Lot Help from another person to put on and taking off regular lower body clothing?: Total 6 Click Score: 9   End of Session Equipment Utilized During Treatment: Rolling walker;Gait belt Nurse Communication: Mobility status;Precautions  Activity Tolerance: Patient tolerated treatment well Patient left: in bed;with call bell/phone within reach;with bed alarm set;with SCD's reapplied  OT Visit Diagnosis: Muscle weakness (generalized) (M62.81)                Time: 4098-1191 OT Time Calculation (min): 49 min Charges:  OT General Charges $OT Visit: 1 Visit OT  Evaluation $OT Eval Moderate Complexity: 1 Mod   Mateo Flow, OTR/L  Acute Rehabilitation Services Pager: [phone] Office: [phone] .   Mateo Flow 12/10/2018, 3:15 PM

## 2018-12-10 NOTE — Progress Notes (Addendum)
Nutrition Follow-up  DOCUMENTATION CODES:   Not applicable  INTERVENTION:    Continue current diet order, advancement as able per SLP/MD  Ensure Enlive po TID prn intake of meal < 50%, each supplement provides 350 kcal and 20 grams of protein  NUTRITION DIAGNOSIS:   Inadequate oral intake related to inability to eat as evidenced by NPO status.  Resolved  GOAL:   Patient will meet greater than or equal to 90% of their needs  Met   MONITOR:   PO intake, Supplement acceptance  ASSESSMENT:   63 yo male with PMH of seizures, medication noncompliance, alcohol abuse who was admitted with seizure, hypothermia, severe bradycardia. Intubated on admission.  Extubated 1/10. Diet advanced to dysphagia 1 with thin liquids s/p MBS on 1/11.  Intake of meals is excellent. Patient is consuming 100% of most meals.   Labs reviewed: potassium 3.4 (L), magnesium 1.6 (L) CBG's: 29-77-76 Medications reviewed and include folic acid, thiamine.  Weight trending up with positive fluid status; I/O + 9.4 L since admission. Hands are very swollen.   NUTRITION - FOCUSED PHYSICAL EXAM:    Most Recent Value  Orbital Region  No depletion  Upper Arm Region  No depletion  Thoracic and Lumbar Region  No depletion  Buccal Region  No depletion  Temple Region  No depletion  Clavicle Bone Region  Mild depletion  Clavicle and Acromion Bone Region  No depletion  Scapular Bone Region  No depletion  Dorsal Hand  No depletion  Patellar Region  No depletion  Anterior Thigh Region  No depletion  Posterior Calf Region  No depletion  Edema (RD Assessment)  Moderate  Hair  Reviewed  Eyes  Reviewed  Mouth  Reviewed  Skin  Reviewed  Nails  Reviewed       Diet Order:   Diet Order            DIET - DYS 1 Room service appropriate? Yes; Fluid consistency: Thin  Diet effective now              EDUCATION NEEDS:   No education needs have been identified at this time  Skin:  Skin Assessment: Skin  Integrity Issues: Skin Integrity Issues:: Stage II Stage II: healing stage II to sacrum  Last BM:  1/13 (type 7)  Height:   Ht Readings from Last 1 Encounters:  12/05/18 5' 8" (1.727 m)    Weight:   Wt Readings from Last 1 Encounters:  12/10/18 100.1 kg   Admission weight 89.1 kg (BMI=29.9)  Ideal Body Weight:  70 kg  BMI:  Body mass index is 33.55 kg/m.  Estimated Nutritional Needs:   Kcal:  2000-2200  Protein:  105-125 gm  Fluid:  2.1 L    Molli Barrows, RD, LDN, CNSC Pager [phone] After Hours Pager [phone]

## 2018-12-10 NOTE — Progress Notes (Signed)
Physical Therapy Treatment Patient Details Name: Mason Hart MRN: [id number] DOB: Nov 24, 1956 Today's Date: 12/10/2018    History of Present Illness 63 yo male admitted admit 1/8 after seizure at home, intubated 1/8-1/10. PMH includes: bradycardia, AMS, anemia, hypoglycemia, hypokalemia, hypothermia, hyponatremia, ETOH abuse, R clav fracture    PT Comments    Pt was able to progress to hallway gait today, although remains significantly unsteady and needs assist for all aspects of his mobility.  He has, reportedly, unreliable help at home.  He would benefit from post acute rehab before returning home alone.  PT will continue to follow acutely for safe mobility progression.    Follow Up Recommendations  SNF     Equipment Recommendations  3in1 (PT)    Recommendations for Other Services   NA     Precautions / Restrictions Precautions Precautions: Fall    Mobility  Bed Mobility Overal bed mobility: Needs Assistance Bed Mobility: Sit to Supine       Sit to supine: +2 for physical assistance;Mod assist   General bed mobility comments: Two person mod assist to return to supine in the bed.  Pt needed help at trunk and to lift both legs.    Transfers Overall transfer level: Needs assistance Equipment used: Rolling walker (2 wheeled) Transfers: Sit to/from Stand Sit to Stand: +2 physical assistance;Mod assist         General transfer comment: Pt required mod to max assit to come to standing depending on the height of the sitting surface.    Ambulation/Gait Ambulation/Gait assistance: Mod assist;+2 safety/equipment;Min assist Gait Distance (Feet): 75 Feet Assistive device: Rolling walker (2 wheeled) Gait Pattern/deviations: Step-through pattern;Staggering left;Staggering right;Trunk flexed Gait velocity: decreased   General Gait Details: Pt with flexed trunk over RW, min to mod assist to ambulate depending on if he was going straight or navigating ovstacles, turns, and  tight spaces.  He reports he usually walks with a cane, but has a RW at home. Chair followed for increased safety and to encourage increased gait distance.           Balance Overall balance assessment: Needs assistance Sitting-balance support: Feet supported;Bilateral upper extremity supported Sitting balance-Leahy Scale: Fair     Standing balance support: Bilateral upper extremity supported Standing balance-Leahy Scale: Poor Standing balance comment: needs support from therapist and RW.                             Cognition Arousal/Alertness: Awake/alert Behavior During Therapy: WFL for tasks assessed/performed Overall Cognitive Status: Impaired/Different from baseline Area of Impairment: Following commands;Safety/judgement;Problem solving                     Memory: Decreased short-term memory Following Commands: Follows one step commands consistently Safety/Judgement: Decreased awareness of safety Awareness: Intellectual Problem Solving: Slow processing;Difficulty sequencing General Comments: oriented x4, pt slow to process information         General Comments General comments (skin integrity, edema, etc.): sacral wound noted      Pertinent Vitals/Pain Pain Assessment: No/denies pain Faces Pain Scale: Hurts whole lot Pain Location: buttock Pain Descriptors / Indicators: Grimacing Pain Intervention(s): Monitored during session;Repositioned    Home Living Family/patient expects to be discharged to:: Skilled nursing facility     Type of Home: House Home Access: Stairs to enter   Home Layout: One level Home Equipment: Environmental consultant - 2 wheels Additional Comments: was living by himself until wife's friend came  back and been staying at the house for 4- 5weeks. not reliable help. Does not drive. rides the bus for transportation    Prior Function Level of Independence: Independent with assistive device(s)      Comments: reports walks with cane and  brought it with him    PT Goals (current goals can now be found in the care plan section) Acute Rehab PT Goals Patient Stated Goal: to get his strength and independence back so he can return home.  Progress towards PT goals: Progressing toward goals    Frequency    Min 2X/week      PT Plan Current plan remains appropriate    Co-evaluation PT/OT/SLP Co-Evaluation/Treatment: Yes Reason for Co-Treatment: Complexity of the patient's impairments (multi-system involvement);Necessary to address cognition/behavior during functional activity;For patient/therapist safety;To address functional/ADL transfers PT goals addressed during session: Mobility/safety with mobility;Balance;Proper use of DME;Strengthening/ROM OT goals addressed during session: ADL's and self-care;Proper use of Adaptive equipment and DME      AM-PAC PT "6 Clicks" Mobility   Outcome Measure  Help needed turning from your back to your side while in a flat bed without using bedrails?: A Lot Help needed moving from lying on your back to sitting on the side of a flat bed without using bedrails?: A Lot Help needed moving to and from a bed to a chair (including a wheelchair)?: A Lot Help needed standing up from a chair using your arms (e.g., wheelchair or bedside chair)?: A Lot Help needed to walk in hospital room?: A Lot Help needed climbing 3-5 steps with a railing? : Total 6 Click Score: 11    End of Session Equipment Utilized During Treatment: Gait belt Activity Tolerance: Patient limited by fatigue;Patient limited by pain Patient left: in bed;with call bell/phone within reach;with bed alarm set Nurse Communication: Mobility status PT Visit Diagnosis: Other abnormalities of gait and mobility (R26.89);Muscle weakness (generalized) (M62.81);Pain Pain - Right/Left: Left Pain - part of body: Knee     Time: 1610-9604 PT Time Calculation (min) (ACUTE ONLY): 53 min  Charges:  $Gait Training: 23-37 mins             Alfredia Desanctis B. Candita Borenstein, PT, DPT  Acute Rehabilitation [phone] pager #[phone] office            12/10/2018, 4:45 PM

## 2018-12-10 NOTE — Plan of Care (Signed)
Patient remains extremely swollen, encouraged patient to move BUE/BLE and prop upon pillows. Patient out of bed to chair with assistance.

## 2018-12-10 NOTE — Progress Notes (Addendum)
[place] TEAM 1 - Stepdown/ICU TEAM  Mason Hart  NFA:[id number] DOB: 01-21-1956 DOA: 12/04/2018 PCP: [name], [name]    Brief Narrative:  63 yo M w/ a hx of seizure disorder who presented to [facility] ED after having a seizure at home. He was last seen well around 3 PM on 1/8 and was subsequently found that evening with active status epilepticus. He was bradycardic with HR 30s and required TC pacing. He was also found to be hypothermic (81*) though he was in his home when he had the seizure. He was not protecting his airway so he was intubated in the ED. His seizure broke with ativan but then he became completely unresponsive. He was loaded with keppra by Neurology then his dose was increased to 1 g BID. EEG showed diffuse generalized irregular slowing. He remained hypotensive despite propofol being stopped, he was started on levophed. ABG BMP with acute respiratory and metabolic acidosis with gap. ETOH level 14. K 3.1, mag 2.0, CK >700. CT head showed no acute findings. He was initially started on propofol but then this was stopped by the bradycardia and QT prolongation.   Patient extubated, weaned off pressors transferred to hospitalist service.  Failed swallow evaluation.  Had barium swallow.  Per speech therapy the recommended pured, thin liquids.  Subjective: Complains of swelling and pain in both arms.  No nausea no vomiting no fever no chills.  Remains hypoglycemic throughout the day.  Mentation improving.  Oral intake improving as well.  Assessment & Plan:  Seizures, post ictal  s/p keppra load - continue keppra at increased dose of 1000 mg BID as per Neurology - etiology felt to be noncompliance w/ home meds Switch IV to oral. Continue in the progressive unit due to hypoglycemia.  Acute hypercarbic respiratory acidosis - presumed aspiration  Extubated and able to maintain his airway - has completed a course of Zosyn - no sign of active infection presently   Altered mental  status related to prolonged postictal state - appears to be slowly recovering   Shock - Persistent hypotension  Required levophed - now off pressors - resolved w/ pt now hypertensive   AKI Resolved w/ volume expansion  Rhabdomyolysis  CK has improved to normal range w/ volume expansion   Bradycardia 2/2 hypothermia resolved   Prolonged QTc Monitoring on tele   Hypokalemia  Supplement to goal of 4.0  ?GI Bleed  NGT previously with bloody output - Hgb 11 at admit- no further evidence of bleeding  ETOH use  no signs of alcohol withdrawal  Elevated transaminase  elevated in pattern consistent with ETOH use - LFTs now normal   Chronic hyponatremia  ?beer potomania and hypovolemia - Na has normalized with IV fluids  Bilateral upper extremity swelling. We will perform ultrasound Doppler to rule out DVT.  Persistent hypoglycemia. Likely secondary to poor p.o. intake. Patient is now on less restricted diet therefore anticipating improvement in oral intake. For now overnight will gently provide D5 half-normal saline with potassium.  DVT prophylaxis: SQ heparin  Code Status: FULL CODE Family Communication: no family present at time of exam  Disposition Plan: stable for SDU bed  Consultants:  Neurology  PCCM  Antimicrobials:  None presently   Objective: Blood pressure 139/71, pulse 60, temperature 98 F (36.7 C), temperature source Oral, resp. rate 16, height 5\' 8"  (1.727 m), weight 100.1 kg, SpO2 100 %.  Intake/Output Summary (Last 24 hours) at 12/10/2018 1722 Last data filed at 12/10/2018 1300 Gross  per 24 hour  Intake 1813.46 ml  Output 170 ml  Net 1643.46 ml   Filed Weights   12/08/18 0500 12/09/18 0432 12/10/18 0500  Weight: 97.3 kg 99.4 kg 100.1 kg    Examination: General: No acute respiratory distress Lungs: Clear to auscultation bilaterally without wheezes or crackles Cardiovascular: Regular rate and rhythm without murmur gallop or rub normal  S1 and S2 Abdomen: Nontender, nondistended, soft, bowel sounds positive, no rebound, no ascites, no appreciable mass Extremities: No significant cyanosis, clubbing, or edema bilateral lower extremities  CBC: Recent Labs  Lab 12/08/18 0319 12/09/18 0240 12/10/18 0352  WBC 4.0 4.4 4.9  HGB 8.5* 9.9* 8.7*  HCT 27.0* 32.6* 28.7*  MCV 86.8 85.3 85.9  PLT 162 200 162   Basic Metabolic Panel: Recent Labs  Lab 12/07/18 0749 12/08/18 0319 12/09/18 0240 12/10/18 0352  NA 139 138 139 139  K 3.4* 3.6 3.2* 3.4*  CL 111 112* 112* 110  CO2 22 20* 21* 23  GLUCOSE 124* 88 105* 84  BUN 9 7* 8 8  CREATININE 0.91 0.71 0.73 0.84  CALCIUM 8.5* 8.7* 8.7* 8.6*  MG 2.2 2.0 1.8 1.6*  PHOS 3.2 3.0 3.2  --    GFR: Estimated Creatinine Clearance: 101.9 mL/min (by C-G formula based on SCr of 0.84 mg/dL).  Liver Function Tests: Recent Labs  Lab 12/06/18 0946 12/07/18 0749 12/08/18 0319 12/09/18 0240  AST 51* 44* 42* 31  ALT 26 25 30 28   ALKPHOS 47 44 49 51  BILITOT 0.6 0.4 0.5 0.7  PROT 5.9* 5.8* 6.3* 6.4*  ALBUMIN 2.2* 2.1* 2.2* 2.2*    Cardiac Enzymes: Recent Labs  Lab 12/04/18 2012  12/05/18 0056 12/05/18 0927 12/05/18 1551 12/06/18 0253 12/06/18 0946 12/08/18 0319 12/09/18 0240  CKTOTAL 731*  --   --   --   --   --  1,447* 459* 224  TROPONINI  --    < > <0.03 0.04* 0.03* <0.03 <0.03  --   --    < > = values in this interval not displayed.     Recent Results (from the past 240 hour(s))  Culture, blood (routine x 2)     Status: None   Collection Time: 12/04/18 10:09 PM  Result Value Ref Range Status   Specimen Description BLOOD LEFT HAND  Final   Special Requests   Final    BOTTLES DRAWN AEROBIC ONLY Blood Culture results may not be optimal due to an inadequate volume of blood received in culture bottles   Culture   Final    NO GROWTH 5 DAYS Performed at [facility], [address], [place], [place] [address]    Report Status 12/09/2018 FINAL  Final    Culture, blood (routine x 2)     Status: None   Collection Time: 12/04/18 10:09 PM  Result Value Ref Range Status   Specimen Description BLOOD RIGHT HAND  Final   Special Requests   Final    BOTTLES DRAWN AEROBIC AND ANAEROBIC Blood Culture adequate volume   Culture   Final    NO GROWTH 5 DAYS Performed at [facility], [address], [place], [place] [address]    Report Status 12/09/2018 FINAL  Final  Urine culture     Status: None   Collection Time: 12/04/18 10:09 PM  Result Value Ref Range Status   Specimen Description URINE, RANDOM  Final   Special Requests NONE  Final   Culture   Final  NO GROWTH Performed at [facility], [address] [place], [place] [address]    Report Status 12/06/2018 FINAL  Final  Culture, respiratory (tracheal aspirate)     Status: None   Collection Time: 12/04/18 11:41 PM  Result Value Ref Range Status   Specimen Description TRACHEAL ASPIRATE  Final   Special Requests NONE  Final   Gram Stain   Final    ABUNDANT WBC PRESENT,BOTH PMN AND MONONUCLEAR ABUNDANT GRAM POSITIVE COCCI FEW GRAM VARIABLE ROD Performed at [facility], [address], [place], [place] [address]    Culture ABUNDANT Consistent with normal respiratory flora.  Final   Report Status 12/07/2018 FINAL  Final  MRSA PCR Screening     Status: None   Collection Time: 12/04/18 11:41 PM  Result Value Ref Range Status   MRSA by PCR NEGATIVE NEGATIVE Final    Comment:        The GeneXpert MRSA Assay (FDA approved for NASAL specimens only), is one component of a comprehensive MRSA colonization surveillance program. It is not intended to diagnose MRSA infection nor to guide or monitor treatment for MRSA infections. Performed at [facility], [address] [place], [place] [address]      Scheduled Meds: . cloNIDine  0.1 mg Oral TID  . folic acid  1 mg Oral Daily  . heparin injection (subcutaneous)  5,000 Units Subcutaneous Q8H  . levETIRAcetam   1,000 mg Oral BID  . mouth rinse  15 mL Mouth Rinse BID  . pantoprazole  40 mg Oral BID  . thiamine  100 mg Oral Daily   Continuous Infusions: . sodium chloride Stopped (12/10/18 0125)  . dextrose 5 % and 0.45 % NaCl with KCl 20 mEq/L    . magnesium sulfate 1 - 4 g bolus IVPB 2 g (12/10/18 1637)     LOS: 6 days   Author:  Lynden Oxford, MD [facility] 12/10/2018  If 7PM-7AM, please contact night-coverage To reach On-call, see [email or web address]   If 7PM-7AM, please contact night-coverage per Amion 12/10/2018, 5:22 PM

## 2018-12-11 ENCOUNTER — Ambulatory Visit (HOSPITAL_COMMUNITY): Payer: BC Managed Care – PPO

## 2018-12-11 DIAGNOSIS — R609 Edema, unspecified: Secondary | ICD-10-CM

## 2018-12-11 DIAGNOSIS — R52 Pain, unspecified: Secondary | ICD-10-CM

## 2018-12-11 LAB — COMPREHENSIVE METABOLIC PANEL
ALT: 31 U/L (ref 0–44)
AST: 37 U/L (ref 15–41)
Albumin: 2.1 g/dL — ABNORMAL LOW (ref 3.5–5.0)
Alkaline Phosphatase: 48 U/L (ref 38–126)
Anion gap: 9 (ref 5–15)
BUN: 9 mg/dL (ref 8–23)
CO2: 21 mmol/L — ABNORMAL LOW (ref 22–32)
Calcium: 8.5 mg/dL — ABNORMAL LOW (ref 8.9–10.3)
Chloride: 106 mmol/L (ref 98–111)
Creatinine, Ser: 0.68 mg/dL (ref 0.61–1.24)
GFR calc Af Amer: >60 mL/min (ref >60)
Glucose, Bld: 77 mg/dL (ref 70–99)
Potassium: 4.9 mmol/L (ref 3.5–5.1)
Sodium: 136 mmol/L (ref 135–145)
Total Bilirubin: 0.8 mg/dL (ref 0.3–1.2)
Total Protein: 6.1 g/dL — ABNORMAL LOW (ref 6.5–8.1)

## 2018-12-11 LAB — MAGNESIUM: Magnesium: 1.8 mg/dL (ref 1.7–2.4)

## 2018-12-11 LAB — CBC WITH DIFFERENTIAL/PLATELET
Abs Immature Granulocytes: 0.03 10*3/uL (ref 0.00–0.07)
Basophils Absolute: 0 10*3/uL (ref 0.0–0.1)
Basophils Relative: 0 %
Eosinophils Absolute: 0.1 10*3/uL (ref 0.0–0.5)
Eosinophils Relative: 2 %
HCT: 28 % — ABNORMAL LOW (ref 39.0–52.0)
Hemoglobin: 8.7 g/dL — ABNORMAL LOW (ref 13.0–17.0)
Immature Granulocytes: 1 %
Lymphocytes Relative: 39 %
Lymphs Abs: 2 10*3/uL (ref 0.7–4.0)
MCH: 26.4 pg (ref 26.0–34.0)
MCHC: 31.1 g/dL (ref 30.0–36.0)
MCV: 85.1 fL (ref 80.0–100.0)
Monocytes Absolute: 0.8 10*3/uL (ref 0.1–1.0)
Monocytes Relative: 15 %
Neutro Abs: 2.2 10*3/uL (ref 1.7–7.7)
Neutrophils Relative %: 43 %
Platelets: 153 10*3/uL (ref 150–400)
RBC: 3.29 MIL/uL — ABNORMAL LOW (ref 4.22–5.81)
RDW: 15.8 % — ABNORMAL HIGH (ref 11.5–15.5)
WBC: 5.1 10*3/uL (ref 4.0–10.5)
nRBC: 0 % (ref 0.0–0.2)

## 2018-12-11 LAB — GLUCOSE, CAPILLARY
Glucose-Capillary: 29 mg/dL — CL (ref 70–99)
Glucose-Capillary: 96 mg/dL (ref 70–99)
Glucose-Capillary: 65 mg/dL — ABNORMAL LOW (ref 70–99)
Glucose-Capillary: 118 mg/dL — ABNORMAL HIGH (ref 70–99)
Glucose-Capillary: 92 mg/dL (ref 70–99)
Glucose-Capillary: 88 mg/dL (ref 70–99)
Glucose-Capillary: 95 mg/dL (ref 70–99)
Glucose-Capillary: 133 mg/dL — ABNORMAL HIGH (ref 70–99)

## 2018-12-11 MED ORDER — DEXTROSE 10 % IV SOLN
INTRAVENOUS | Status: DC
  Administered 2018-12-11: 15:00:00 via INTRAVENOUS

## 2018-12-11 NOTE — Progress Notes (Signed)
[place] TEAM 1 - Stepdown/ICU TEAM  Mason Hart  ZOX:[id number] DOB: 1956/03/08 DOA: 12/04/2018 PCP: [name], [name]    Brief Narrative:  62 yo M w/ a hx of seizure disorder who presented to [facility] ED after having a seizure at home. He was last seen well around 3 PM on 1/8 and was subsequently found that evening with active status epilepticus. He was bradycardic with HR 30s and required TC pacing. He was also found to be hypothermic (81*) though he was in his home when he had the seizure. He was not protecting his airway so he was intubated in the ED. His seizure broke with ativan but then he became completely unresponsive. He was loaded with keppra by Neurology then his dose was increased to 1 g BID. EEG showed diffuse generalized irregular slowing. He remained hypotensive despite propofol being stopped, he was started on levophed. ABG BMP with acute respiratory and metabolic acidosis with gap. ETOH level 14. K 3.1, mag 2.0, CK >700. CT head showed no acute findings. He was initially started on propofol but then this was stopped by the bradycardia and QT prolongation.   Patient extubated, weaned off pressors transferred to hospitalist service.  Failed swallow evaluation.  Had barium swallow.  Per speech therapy the recommended pured, thin liquids.  Subjective: No acute complaint no nausea no vomiting.  Feeling better.  Assessment & Plan:  Seizures, post ictal  s/p keppra load - continue keppra at increased dose of 1000 mg BID as per Neurology - etiology felt to be noncompliance w/ home meds Switch IV to oral.  Acute hypercarbic respiratory acidosis - presumed aspiration  Extubated and able to maintain his airway - has completed a course of Zosyn - no sign of active infection presently   Altered mental status related to prolonged postictal state - appears to be slowly recovering   Shock - Persistent hypotension  Required levophed - now off pressors - resolved w/ pt now  hypertensive   AKI Resolved w/ volume expansion  Rhabdomyolysis  CK has improved to normal range w/ volume expansion   Bradycardia 2/2 hypothermia resolved   Prolonged QTc Monitoring on tele   Hypokalemia  Supplement to goal of 4.0  ?GI Bleed  NGT previously with bloody output - Hgb 11 at admit- no further evidence of bleeding  ETOH use  no signs of alcohol withdrawal  Elevated transaminase  elevated in pattern consistent with ETOH use - LFTs now normal   Chronic hyponatremia  ?beer potomania and hypovolemia - Na has normalized with IV fluids  Bilateral upper extremity swelling. Negative Doppler examination for any DVT.  Persistent hypoglycemia. Likely secondary to poor p.o. intake. Patient is now on less restricted diet therefore anticipating improvement in oral intake. She was on D5 half-normal saline with potassium.  Will switch to D10 due to persistent hypoglycemia.  Monitor.  DVT prophylaxis: SQ heparin  Code Status: FULL CODE Family Communication: no family present at time of exam  Disposition Plan: Transfer to telemetry.  Discharge to SNF when medically stable.  Hopefully in couple of days.  Consultants:  Neurology  PCCM  Antimicrobials:  None presently   Objective: Blood pressure 113/63, pulse 67, temperature 98.3 F (36.8 C), temperature source Oral, resp. rate 18, height 5\' 8"  (1.727 m), weight 102.3 kg, SpO2 100 %.  Intake/Output Summary (Last 24 hours) at 12/11/2018 1903 Last data filed at 12/11/2018 1400 Gross per 24 hour  Intake 869.36 ml  Output 1450 ml  Net -  580.64 ml   Filed Weights   12/09/18 0432 12/10/18 0500 12/11/18 0500  Weight: 99.4 kg 100.1 kg 102.3 kg    Examination: General: No acute respiratory distress Lungs: Clear to auscultation bilaterally without wheezes or crackles Cardiovascular: Regular rate and rhythm without murmur gallop or rub normal S1 and S2 Abdomen: Nontender, nondistended, soft, bowel sounds  positive, no rebound, no ascites, no appreciable mass Extremities: No significant cyanosis, clubbing, or edema bilateral lower extremities  CBC: Recent Labs  Lab 12/09/18 0240 12/10/18 0352 12/11/18 0316  WBC 4.4 4.9 5.1  NEUTROABS  --   --  2.2  HGB 9.9* 8.7* 8.7*  HCT 32.6* 28.7* 28.0*  MCV 85.3 85.9 85.1  PLT 200 162 153   Basic Metabolic Panel: Recent Labs  Lab 12/07/18 0749 12/08/18 0319 12/09/18 0240 12/10/18 0352 12/11/18 0316  NA 139 138 139 139 136  K 3.4* 3.6 3.2* 3.4* 4.9  CL 111 112* 112* 110 106  CO2 22 20* 21* 23 21*  GLUCOSE 124* 88 105* 84 77  BUN 9 7* 8 8 9   CREATININE 0.91 0.71 0.73 0.84 0.68  CALCIUM 8.5* 8.7* 8.7* 8.6* 8.5*  MG 2.2 2.0 1.8 1.6* 1.8  PHOS 3.2 3.0 3.2  --   --    GFR: Estimated Creatinine Clearance: 108.2 mL/min (by C-G formula based on SCr of 0.68 mg/dL).  Liver Function Tests: Recent Labs  Lab 12/07/18 0749 12/08/18 0319 12/09/18 0240 12/11/18 0316  AST 44* 42* 31 37  ALT 25 30 28 31   ALKPHOS 44 49 51 48  BILITOT 0.4 0.5 0.7 0.8  PROT 5.8* 6.3* 6.4* 6.1*  ALBUMIN 2.1* 2.2* 2.2* 2.1*    Cardiac Enzymes: Recent Labs  Lab 12/04/18 2012  12/05/18 0056 12/05/18 0927 12/05/18 1551 12/06/18 0253 12/06/18 0946 12/08/18 0319 12/09/18 0240  CKTOTAL 731*  --   --   --   --   --  1,447* 459* 224  TROPONINI  --    < > <0.03 0.04* 0.03* <0.03 <0.03  --   --    < > = values in this interval not displayed.     Recent Results (from the past 240 hour(s))  Culture, blood (routine x 2)     Status: None   Collection Time: 12/04/18 10:09 PM  Result Value Ref Range Status   Specimen Description BLOOD LEFT HAND  Final   Special Requests   Final    BOTTLES DRAWN AEROBIC ONLY Blood Culture results may not be optimal due to an inadequate volume of blood received in culture bottles   Culture   Final    NO GROWTH 5 DAYS Performed at [facility], [address], [place], [place] [address]    Report Status 12/09/2018 FINAL   Final  Culture, blood (routine x 2)     Status: None   Collection Time: 12/04/18 10:09 PM  Result Value Ref Range Status   Specimen Description BLOOD RIGHT HAND  Final   Special Requests   Final    BOTTLES DRAWN AEROBIC AND ANAEROBIC Blood Culture adequate volume   Culture   Final    NO GROWTH 5 DAYS Performed at [facility], [address], [place], [place] [address]    Report Status 12/09/2018 FINAL  Final  Urine culture     Status: None   Collection Time: 12/04/18 10:09 PM  Result Value Ref Range Status   Specimen Description URINE, RANDOM  Final   Special Requests  NONE  Final   Culture   Final    NO GROWTH Performed at [facility], [address] [place], [place] [address]    Report Status 12/06/2018 FINAL  Final  Culture, respiratory (tracheal aspirate)     Status: None   Collection Time: 12/04/18 11:41 PM  Result Value Ref Range Status   Specimen Description TRACHEAL ASPIRATE  Final   Special Requests NONE  Final   Gram Stain   Final    ABUNDANT WBC PRESENT,BOTH PMN AND MONONUCLEAR ABUNDANT GRAM POSITIVE COCCI FEW GRAM VARIABLE ROD Performed at [facility], [address] [place], [place] [address]    Culture ABUNDANT Consistent with normal respiratory flora.  Final   Report Status 12/07/2018 FINAL  Final  MRSA PCR Screening     Status: None   Collection Time: 12/04/18 11:41 PM  Result Value Ref Range Status   MRSA by PCR NEGATIVE NEGATIVE Final    Comment:        The GeneXpert MRSA Assay (FDA approved for NASAL specimens only), is one component of a comprehensive MRSA colonization surveillance program. It is not intended to diagnose MRSA infection nor to guide or monitor treatment for MRSA infections. Performed at [facility], [address], [place], [place] [address]      Scheduled Meds: . cloNIDine  0.1 mg Oral TID  . folic acid  1 mg Oral Daily  . heparin injection (subcutaneous)  5,000 Units Subcutaneous Q8H  .  levETIRAcetam  1,000 mg Oral BID  . mouth rinse  15 mL Mouth Rinse BID  . pantoprazole  40 mg Oral BID  . tamsulosin  0.4 mg Oral QPC supper  . thiamine  100 mg Oral Daily   Continuous Infusions: . sodium chloride Stopped (12/10/18 0125)  . dextrose 40 mL/hr at 12/11/18 1800     LOS: 7 days   Author:  Lynden Oxford, MD [facility] 12/11/2018  If 7PM-7AM, please contact night-coverage To reach On-call, see [email or web address]   If 7PM-7AM, please contact night-coverage per Amion 12/11/2018, 7:03 PM

## 2018-12-11 NOTE — Clinical Social Work Note (Signed)
Clinical Social Work Assessment  Patient Details  Name: Mason Hart MRN: [id number] Date of Birth: 23-Oct-1956  Date of referral:  12/11/18               Reason for consult:  Facility Placement                Permission sought to share information with:  Other Permission granted to share information::  No  Name::     none given  Agency::  none given   Relationship::  none given  Contact Information:  none given  Housing/Transportation Living arrangements for the past 2 months:  Single Family Home(alone. ) Source of Information:  Patient Patient Interpreter Needed:  None Criminal Activity/Legal Involvement Pertinent to Current Situation/Hospitalization:  No - Comment as needed Significant Relationships:  None Lives with:    Do you feel safe going back to the place where you live?  Yes Need for family participation in patient care:  Yes (Comment)  Care giving concerns:  CSW consulted for discharge planning needs. CSW spoke with py at bedside to discuss further  needs at this time.    Social Worker assessment / plan:  CSW spoke with pt at bedside. CSW was informed that pt is from home where pt lives alone. Pt reports that pt has no family support or support at all. CSW spoke with pt regarding placement. Pt expressed that he does need further assistance and rehab to get pt back to his home.   CSW left bed offers at bedside and asked that pt look through list to determine of three circled-which one pt would be most interested in.  Employment status:  Other (Comment)(unknown ) Insurance information:  Other (Comment Required) PT Recommendations:  Skilled Nursing Facility Information / Referral to community resources:  Skilled Nursing Facility  Patient/Family's Response to care:  Cpt appeared to be understanding and agreeable to plan of care at this time.   Patient/Family's Understanding of and Emotional Response to Diagnosis, Current Treatment, and Prognosis:  No further questions or  concerns have been presented to CSW at this time.   Emotional Assessment Appearance:  Appears stated age Attitude/Demeanor/Rapport:  Engaged Affect (typically observed):  Appropriate Orientation:  Oriented to Self, Oriented to Place, Oriented to  Time, Oriented to Situation Alcohol / Substance use:  Not Applicable Psych involvement (Current and /or in the community):  No (Comment)  Discharge Needs  Concerns to be addressed:  Denies Needs/Concerns at this time Readmission within the last 30 days:  No Current discharge risk:  Dependent with Mobility, Lives alone Barriers to Discharge:  Continued Medical Work up   [employer], LCSWA 12/11/2018, 11:18 AM

## 2018-12-11 NOTE — Progress Notes (Signed)
Bilateral upper extremities venous duplex exam completed. Please see preliminary notes on CV PROC under chart review.  Latorya Bautch H Kentravious Lipford(RDMS RVT) 12/11/18 4:48 PM

## 2018-12-12 LAB — BASIC METABOLIC PANEL
ANION GAP: 6 (ref 5–15)
BUN: 7 mg/dL — ABNORMAL LOW (ref 8–23)
CO2: 26 mmol/L (ref 22–32)
Calcium: 8.8 mg/dL — ABNORMAL LOW (ref 8.9–10.3)
Chloride: 105 mmol/L (ref 98–111)
Creatinine, Ser: 0.94 mg/dL (ref 0.61–1.24)
GFR calc Af Amer: >60 mL/min (ref >60)
GFR calc non Af Amer: >60 mL/min (ref >60)
GLUCOSE: 87 mg/dL (ref 70–99)
Potassium: 3.8 mmol/L (ref 3.5–5.1)
Sodium: 137 mmol/L (ref 135–145)

## 2018-12-12 LAB — CBC
HCT: 30.2 % — ABNORMAL LOW (ref 39.0–52.0)
Hemoglobin: 9.1 g/dL — ABNORMAL LOW (ref 13.0–17.0)
MCH: 26 pg (ref 26.0–34.0)
MCHC: 30.1 g/dL (ref 30.0–36.0)
MCV: 86.3 fL (ref 80.0–100.0)
PLATELETS: 174 10*3/uL (ref 150–400)
RBC: 3.5 MIL/uL — ABNORMAL LOW (ref 4.22–5.81)
RDW: 16.1 % — ABNORMAL HIGH (ref 11.5–15.5)
WBC: 6.9 10*3/uL (ref 4.0–10.5)
nRBC: 0 % (ref 0.0–0.2)

## 2018-12-12 LAB — GLUCOSE, CAPILLARY
GLUCOSE-CAPILLARY: 90 mg/dL (ref 70–99)
Glucose-Capillary: 94 mg/dL (ref 70–99)
Glucose-Capillary: 93 mg/dL (ref 70–99)

## 2018-12-12 LAB — MAGNESIUM: Magnesium: 1.7 mg/dL (ref 1.7–2.4)

## 2018-12-12 NOTE — Progress Notes (Signed)
Speech Language Pathology Treatment: Dysphagia  Patient Details Name: Mason Hart MRN: [id number] DOB: 05-18-1956 Today's Date: 12/12/2018 Time: 4098-1191 SLP Time Calculation (min) (ACUTE ONLY): 12 min  Assessment / Plan / Recommendation Clinical Impression  Pt demonstrates normal swallowing function, able to masticate regular solids. No need for diet modification or further SLP interventions. Will uprgrade diet and sign off.   HPI HPI: Mason Hart is a 63 y.o. male who has a PMH of seizures.   He presented to [facility] ED 1/8 after he had a seizure at his home.  He was apparently post ictal upon EMS arrival with HR in the 30's.  He also has a history of ETOH.  EMS was starting TC pacing, but HR improved.  Upon arrival to ED, he had another seizure which was treated with ativan.  He was later intubated for airway protection.  In ED, he was noted to be hypothermic to 78F (Per EMS, pt was not outside prior to or during seizure and was inside the home).  He was placed on a warming blanket.  Labs were noteable for K of 3.1, CK 731.  EKG showed QTc of 628.   He was seen by neurology and loaded with Keppra (is on Keppra as an outpatient).  PCCM was later called for ICU admission and intubated for airway protection.  Most recent chest xray is showing minimal left basilar subsegmental atelectasis.  Head CT was showing no acute findings but stable chronic microvascular ischemic changes and volume loss.        SLP Plan  All goals met       Recommendations                   Plan: All goals met       GO                Joene Gelder, Riley Nearing 12/12/2018, 10:37 AM

## 2018-12-12 NOTE — Progress Notes (Signed)
Report called to 3W RN.  

## 2018-12-12 NOTE — Progress Notes (Signed)
Mason Hart  ZOX:[id number] DOB: 1956-04-25 DOA: 12/04/2018 PCP: [name], [name], [name]    Brief Narrative:  63 yo M w/ a hx of seizure disorder who presented to [facility] ED after having a seizure at home. He was last seen well around 3 PM on 1/8 and was subsequently found that evening with active status epilepticus. He was bradycardic with HR 30s and required TC pacing. He was also found to be hypothermic (81*) though he was in his home when he had the seizure. He was not protecting his airway so he was intubated in the ED. His seizure broke with ativan but then he became completely unresponsive. He was loaded with keppra by Neurology then his dose was increased to 1 g BID. EEG showed diffuse generalized irregular slowing. He remained hypotensive despite propofol being stopped, he was started on levophed. ABG BMP with acute respiratory and metabolic acidosis with gap. ETOH level 14. K 3.1, mag 2.0, CK >700. CT head showed no acute findings. He was initially started on propofol but then this was stopped by the bradycardia and QT prolongation.   Patient extubated, weaned off pressors transferred to hospitalist service. Failed swallow evaluation.  Had barium swallow.    Subjective: Oral intake improving.  No nausea no vomiting no fever no chills.  No diarrhea.  Assessment & Plan:  Seizures, post ictal  s/p keppra load - continue keppra at increased dose of 1000 mg BID as per Neurology - etiology felt to be noncompliance w/ home meds Switch IV to oral.  Acute hypercarbic respiratory acidosis - presumed aspiration  Extubated and able to maintain his airway - has completed a course of Zosyn - no sign of active infection presently   Altered mental status related to prolonged postictal state - appears to be slowly recovering   Shock - Persistent hypotension  Required levophed - now off pressors - resolved w/ pt now hypertensive   AKI Resolved w/ volume expansion  Rhabdomyolysis  CK has  improved to normal range w/ volume expansion   Bradycardia 2/2 hypothermia resolved   Prolonged QTc Monitoring on tele   Hypokalemia  Supplement to goal of 4.0  ?GI Bleed  NGT previously with bloody output - Hgb 11 at admit- no further evidence of bleeding  ETOH use  no signs of alcohol withdrawal  Elevated transaminase  elevated in pattern consistent with ETOH use - LFTs now normal   Chronic hyponatremia  ?beer potomania and hypovolemia - Na has normalized with IV fluids  Bilateral upper extremity swelling. Negative Doppler examination for any DVT.  Persistent hypoglycemia. Likely secondary to poor p.o. intake. Patient is now on less restricted diet therefore anticipating improvement in oral intake. She was on D5 half-normal saline with potassium.  Now D10 due to persistent hypoglycemia.  Now that the oral intake is improving, will reduce it to 10 cc/h. Monitor.  Dysphagia. After extubation patient did have some dysphagia.  Was on restricted diet, currently on regular diet per speech therapy.  DVT prophylaxis: SQ heparin  Code Status: FULL CODE Family Communication: no family present at time of exam  Disposition Plan: Transfer to telemetry.  Discharge to SNF when medically stable.  Hopefully in couple of days.  Consultants:  Neurology  PCCM  Antimicrobials:  None presently   Objective: Blood pressure 109/70, pulse 75, temperature 98 F (36.7 C), temperature source Oral, resp. rate 16, height 5\' 8"  (1.727 m), weight 101.6 kg, SpO2 100 %.  Intake/Output Summary (Last 24 hours) at  12/12/2018 1349 Last data filed at 12/12/2018 0800 Gross per 24 hour  Intake 721.24 ml  Output 750 ml  Net -28.76 ml   Filed Weights   12/10/18 0500 12/11/18 0500 12/12/18 0500  Weight: 100.1 kg 102.3 kg 101.6 kg    Examination: General: No acute respiratory distress Lungs: Clear to auscultation bilaterally without wheezes or crackles Cardiovascular: Regular rate and  rhythm without murmur gallop or rub normal S1 and S2 Abdomen: Nontender, nondistended, soft, bowel sounds positive, no rebound, no ascites, no appreciable mass Extremities: No significant cyanosis, clubbing, or edema bilateral lower extremities  CBC: Recent Labs  Lab 12/10/18 0352 12/11/18 0316 12/12/18 0500  WBC 4.9 5.1 6.9  NEUTROABS  --  2.2  --   HGB 8.7* 8.7* 9.1*  HCT 28.7* 28.0* 30.2*  MCV 85.9 85.1 86.3  PLT 162 153 174   Basic Metabolic Panel: Recent Labs  Lab 12/07/18 0749 12/08/18 0319 12/09/18 0240 12/10/18 0352 12/11/18 0316 12/12/18 0500  NA 139 138 139 139 136 137  K 3.4* 3.6 3.2* 3.4* 4.9 3.8  CL 111 112* 112* 110 106 105  CO2 22 20* 21* 23 21* 26  GLUCOSE 124* 88 105* 84 77 87  BUN 9 7* 8 8 9  7*  CREATININE 0.91 0.71 0.73 0.84 0.68 0.94  CALCIUM 8.5* 8.7* 8.7* 8.6* 8.5* 8.8*  MG 2.2 2.0 1.8 1.6* 1.8 1.7  PHOS 3.2 3.0 3.2  --   --   --    GFR: Estimated Creatinine Clearance: 91.7 mL/min (by C-G formula based on SCr of 0.94 mg/dL).  Liver Function Tests: Recent Labs  Lab 12/07/18 0749 12/08/18 0319 12/09/18 0240 12/11/18 0316  AST 44* 42* 31 37  ALT 25 30 28 31   ALKPHOS 44 49 51 48  BILITOT 0.4 0.5 0.7 0.8  PROT 5.8* 6.3* 6.4* 6.1*  ALBUMIN 2.1* 2.2* 2.2* 2.1*    Cardiac Enzymes: Recent Labs  Lab 12/05/18 1551 12/06/18 0253 12/06/18 0946 12/08/18 0319 12/09/18 0240  CKTOTAL  --   --  1,447* 459* 224  TROPONINI 0.03* <0.03 <0.03  --   --      Recent Results (from the past 240 hour(s))  Culture, blood (routine x 2)     Status: None   Collection Time: 12/04/18 10:09 PM  Result Value Ref Range Status   Specimen Description BLOOD LEFT HAND  Final   Special Requests   Final    BOTTLES DRAWN AEROBIC ONLY Blood Culture results may not be optimal due to an inadequate volume of blood received in culture bottles   Culture   Final    NO GROWTH 5 DAYS Performed at [facility], [address], [place], [place] [address]    Report  Status 12/09/2018 FINAL  Final  Culture, blood (routine x 2)     Status: None   Collection Time: 12/04/18 10:09 PM  Result Value Ref Range Status   Specimen Description BLOOD RIGHT HAND  Final   Special Requests   Final    BOTTLES DRAWN AEROBIC AND ANAEROBIC Blood Culture adequate volume   Culture   Final    NO GROWTH 5 DAYS Performed at [facility], [address] [place], [place] [address]    Report Status 12/09/2018 FINAL  Final  Urine culture     Status: None   Collection Time: 12/04/18 10:09 PM  Result Value Ref Range Status   Specimen Description URINE, RANDOM  Final   Special Requests NONE  Final   Culture   Final    NO GROWTH Performed at [facility], [address] [place], [place] [address]    Report Status 12/06/2018 FINAL  Final  Culture, respiratory (tracheal aspirate)     Status: None   Collection Time: 12/04/18 11:41 PM  Result Value Ref Range Status   Specimen Description TRACHEAL ASPIRATE  Final   Special Requests NONE  Final   Gram Stain   Final    ABUNDANT WBC PRESENT,BOTH PMN AND MONONUCLEAR ABUNDANT GRAM POSITIVE COCCI FEW GRAM VARIABLE ROD Performed at [facility], [address] [place], [place] [address]    Culture ABUNDANT Consistent with normal respiratory flora.  Final   Report Status 12/07/2018 FINAL  Final  MRSA PCR Screening     Status: None   Collection Time: 12/04/18 11:41 PM  Result Value Ref Range Status   MRSA by PCR NEGATIVE NEGATIVE Final    Comment:        The GeneXpert MRSA Assay (FDA approved for NASAL specimens only), is one component of a comprehensive MRSA colonization surveillance program. It is not intended to diagnose MRSA infection nor to guide or monitor treatment for MRSA infections. Performed at [facility], [address] [place], [place] [address]      Scheduled Meds: . cloNIDine  0.1 mg Oral TID  . folic acid  1 mg Oral Daily  . heparin injection (subcutaneous)  5,000 Units  Subcutaneous Q8H  . levETIRAcetam  1,000 mg Oral BID  . mouth rinse  15 mL Mouth Rinse BID  . pantoprazole  40 mg Oral BID  . tamsulosin  0.4 mg Oral QPC supper  . thiamine  100 mg Oral Daily   Continuous Infusions: . sodium chloride Stopped (12/10/18 0125)  . dextrose 40 mL/hr at 12/12/18 0800     LOS: 8 days   Author:  Lynden Oxford, MD [facility] 12/12/2018  If 7PM-7AM, please contact night-coverage To reach On-call, see [email or web address]   If 7PM-7AM, please contact night-coverage per Amion 12/12/2018, 1:49 PM

## 2018-12-12 NOTE — Progress Notes (Signed)
PIV consult: Pt up in chair, no superficial veins noted. Spoke with Rodney Booze, RN: she will place consult when pt is back in bed.

## 2018-12-12 NOTE — Progress Notes (Addendum)
Patient arrived to the unit from (40M) Alert and Oriented X 4. Denies pain Bilateral upper extremity  Swelling Skin is dry. Right knee abrasion. Sacrum wound (large) Placed on tele box 18 All questions and concerns addressed. Bed in the lowest position with bed alarm set. Call bell in reach

## 2018-12-12 NOTE — Progress Notes (Signed)
Pt continues to demonstrate significant weakness, requiring assistance for all mobility and min to moderate assistance for ADL. Pt agrees he needs a brief rehab stay prior to returning home alone.   12/12/18 1300  OT Visit Information  Last OT Received On 12/12/18  Assistance Needed +2  PT/OT/SLP Co-Evaluation/Treatment Yes  Reason for Co-Treatment For patient/therapist safety  OT goals addressed during session ADL's and self-care  History of Present Illness 63 yo male admitted admit 1/8 after seizure at home, intubated 1/8-1/10. PMH includes: bradycardia, AMS, anemia, hypoglycemia, hypokalemia, hypothermia, hyponatremia, ETOH abuse, R clav fracture  Precautions  Precautions Fall  Pain Assessment  Pain Assessment Faces  Faces Pain Scale 4  Pain Location L knee  Pain Descriptors / Indicators Grimacing  Pain Intervention(s) Monitored during session;Repositioned  Cognition  Arousal/Alertness Awake/alert  Behavior During Therapy WFL for tasks assessed/performed  Overall Cognitive Status Impaired/Different from baseline  Area of Impairment Problem solving;Safety/judgement;Memory  Memory Decreased short-term memory  Safety/Judgement Decreased awareness of safety;Decreased awareness of deficits  Problem Solving Slow processing;Difficulty sequencing  General Comments pt agreeing he needs rehab prior to returning home  Upper Extremity Assessment  RUE Deficits / Details mild edema in hand  LUE Deficits / Details significant edema throughout UE, educated to elevate and perform AROM for edema management  LUE Coordination decreased fine motor  ADL  Overall ADL's  Needs assistance/impaired  Eating/Feeding Set up;Sitting  Eating/Feeding Details (indicate cue type and reason) assist to open containers  Grooming Wash/dry hands;Standing;Min guard  Upper Body Dressing  Minimal assistance;Sitting  Lower Body Dressing Moderate assistance;Sit to/from stand  Lower Body Dressing Details (indicate cue  type and reason) can pull up sock   Functional mobility during ADLs Min guard;Rolling walker  General ADL Comments pt able to release walker in static standing during ADL  Bed Mobility  Overal bed mobility Needs Assistance  Bed Mobility Supine to Sit  Supine to sit Min assist  General bed mobility comments min assist to position hips at EOB using bed pad  Balance  Overall balance assessment Needs assistance  Sitting balance-Leahy Scale Fair  Standing balance-Leahy Scale Fair  Standing balance comment can release walker in static standing, dynamic requires RW  Transfers  Overall transfer level Needs assistance  Equipment used Rolling walker (2 wheeled)  Transfers Sit to/from Stand  Sit to Stand +2 physical assistance;Mod assist;Min guard  General transfer comment 2 person mod assist from bed, min assist from recliner, increased time  OT - End of Session  Equipment Utilized During Treatment Rolling walker;Gait belt  Activity Tolerance Patient tolerated treatment well  Patient left in chair;with call bell/phone within reach;with chair alarm set;with nursing/sitter in room  Nurse Communication Mobility status  OT Assessment/Plan  OT Plan Discharge plan remains appropriate  OT Visit Diagnosis Muscle weakness (generalized) (M62.81)  OT Frequency (ACUTE ONLY) Min 2X/week  Follow Up Recommendations SNF  OT Equipment 3 in 1 bedside commode  AM-PAC OT "6 Clicks" Daily Activity Outcome Measure (Version 2)  Help from another person eating meals? 3  Help from another person taking care of personal grooming? 3  Help from another person toileting, which includes using toliet, bedpan, or urinal? 2  Help from another person bathing (including washing, rinsing, drying)? 2  Help from another person to put on and taking off regular upper body clothing? 3  Help from another person to put on and taking off regular lower body clothing? 2  6 Click Score 15  OT Goal Progression  Progress  towards OT  goals Progressing toward goals  Acute Rehab OT Goals  Patient Stated Goal to get his strength and independence back so he can return home.   OT Goal Formulation With patient  Time For Goal Achievement 12/24/18  Potential to Achieve Goals Good  OT Time Calculation  OT Start Time (ACUTE ONLY) 1110  OT Stop Time (ACUTE ONLY) 1144  OT Time Calculation (min) 34 min  OT General Charges  $OT Visit 1 Visit  OT Treatments  $Self Care/Home Management  8-22 mins  Martie RoundJulie Juanjose Mojica, OTR/L Acute Rehabilitation Services Pager: [phone] Office: [phone]

## 2018-12-12 NOTE — Progress Notes (Signed)
Physical Therapy Treatment Patient Details Name: Mason Hart MRN: [id number] DOB: [birth year] Today's Date: 12/12/2018    History of Present Illness 63 yo male admitted admit 1/8 after seizure at home, intubated 1/8-1/10. PMH includes: bradycardia, AMS, anemia, hypoglycemia, hypokalemia, hypothermia, hyponatremia, ETOH abuse, R clav fracture    PT Comments    Patient progressing slowly with therapy, requiring assistance to stand and ambulating further distances this visit with close chair follow. Pt with poor insight into his poor mobility and believes he will be okay at home. Cont to rec SNF at this time. VSS on RA.     Follow Up Recommendations  SNF     Equipment Recommendations  3in1 (PT)    Recommendations for Other Services       Precautions / Restrictions Precautions Precautions: Fall Restrictions Weight Bearing Restrictions: No    Mobility  Bed Mobility Overal bed mobility: Needs Assistance Bed Mobility: Sit to Supine     Supine to sit: Max assist;+2 for physical assistance Sit to supine: +2 for physical assistance;Mod assist      Transfers Overall transfer level: Needs assistance Equipment used: Rolling walker (2 wheeled) Transfers: Sit to/from Stand Sit to Stand: +2 physical assistance;Mod assist         General transfer comment: requring min to mod A to stand from low surfaces, contact guard from elevated with arm rests  Ambulation/Gait Ambulation/Gait assistance: Min guard Gait Distance (Feet): 60 Feet Assistive device: Rolling walker (2 wheeled) Gait Pattern/deviations: Step-through pattern;Staggering left;Staggering right;Trunk flexed Gait velocity: decreased   General Gait Details: pt flexed trunk over RW, adjusted height with no correction in posture, slow gait with chair follow for safety, no overt LOB.    Stairs             Wheelchair Mobility    Modified Rankin (Stroke Patients Only)       Balance Overall balance  assessment: Needs assistance Sitting-balance support: Feet supported;Bilateral upper extremity supported Sitting balance-Leahy Scale: Fair     Standing balance support: Bilateral upper extremity supported Standing balance-Leahy Scale: Poor                              Cognition Arousal/Alertness: Awake/alert Behavior During Therapy: WFL for tasks assessed/performed Overall Cognitive Status: Impaired/Different from baseline Area of Impairment: Following commands;Safety/judgement;Problem solving                     Memory: Decreased short-term memory Following Commands: Follows one step commands consistently Safety/Judgement: Decreased awareness of safety;Decreased awareness of deficits Awareness: Intellectual Problem Solving: Slow processing;Difficulty sequencing General Comments: improving cogntiion still slow processing and poor insight into deficits. unsure true level of baseline.       Exercises      General Comments        Pertinent Vitals/Pain Pain Assessment: Faces Faces Pain Scale: Hurts little more Pain Location: L knee Pain Descriptors / Indicators: Grimacing Pain Intervention(s): Limited activity within patient's tolerance;Monitored during session    Home Living                      Prior Function            PT Goals (current goals can now be found in the care plan section) Acute Rehab PT Goals Patient Stated Goal: to get his strength and independence back so he can return home.  PT Goal Formulation: With patient Time For Goal Achievement:  12/21/18 Potential to Achieve Goals: Fair Progress towards PT goals: Progressing toward goals    Frequency    Min 2X/week      PT Plan Current plan remains appropriate    Co-evaluation PT/OT/SLP Co-Evaluation/Treatment: Yes            AM-PAC PT "6 Clicks" Mobility   Outcome Measure  Help needed turning from your back to your side while in a flat bed without using  bedrails?: A Lot Help needed moving from lying on your back to sitting on the side of a flat bed without using bedrails?: A Lot Help needed moving to and from a bed to a chair (including a wheelchair)?: A Lot Help needed standing up from a chair using your arms (e.g., wheelchair or bedside chair)?: A Lot Help needed to walk in hospital room?: A Lot Help needed climbing 3-5 steps with a railing? : Total 6 Click Score: 11    End of Session Equipment Utilized During Treatment: Gait belt Activity Tolerance: Patient limited by fatigue;Patient limited by pain Patient left: in bed;with call bell/phone within reach;with bed alarm set Nurse Communication: Mobility status PT Visit Diagnosis: Other abnormalities of gait and mobility (R26.89);Muscle weakness (generalized) (M62.81);Pain Pain - Right/Left: Left Pain - part of body: Knee     Time: 1120-1200 PT Time Calculation (min) (ACUTE ONLY): 40 min  Charges:  $Gait Training: 8-22 mins                    Etta Grandchild, PT, DPT Acute Rehabilitation Services Pager: [phone] Office: [phone]     Etta Grandchild 12/12/2018, 12:29 PM

## 2018-12-13 LAB — BASIC METABOLIC PANEL
Anion gap: 7 (ref 5–15)
BUN: 10 mg/dL (ref 8–23)
CO2: 24 mmol/L (ref 22–32)
Calcium: 8.7 mg/dL — ABNORMAL LOW (ref 8.9–10.3)
Chloride: 105 mmol/L (ref 98–111)
Creatinine, Ser: 0.83 mg/dL (ref 0.61–1.24)
GFR calc Af Amer: >60 mL/min (ref >60)
GFR calc non Af Amer: >60 mL/min (ref >60)
GLUCOSE: 85 mg/dL (ref 70–99)
Potassium: 4 mmol/L (ref 3.5–5.1)
Sodium: 136 mmol/L (ref 135–145)

## 2018-12-13 LAB — CBC
HCT: 28.2 % — ABNORMAL LOW (ref 39.0–52.0)
Hemoglobin: 8.6 g/dL — ABNORMAL LOW (ref 13.0–17.0)
MCH: 26.3 pg (ref 26.0–34.0)
MCHC: 30.5 g/dL (ref 30.0–36.0)
MCV: 86.2 fL (ref 80.0–100.0)
Platelets: 155 10*3/uL (ref 150–400)
RBC: 3.27 MIL/uL — ABNORMAL LOW (ref 4.22–5.81)
RDW: 16.2 % — ABNORMAL HIGH (ref 11.5–15.5)
WBC: 5.6 10*3/uL (ref 4.0–10.5)
nRBC: 0.4 % — ABNORMAL HIGH (ref 0.0–0.2)

## 2018-12-13 LAB — GLUCOSE, CAPILLARY
GLUCOSE-CAPILLARY: 64 mg/dL — AB (ref 70–99)
Glucose-Capillary: 82 mg/dL (ref 70–99)
Glucose-Capillary: 79 mg/dL (ref 70–99)
Glucose-Capillary: 81 mg/dL (ref 70–99)

## 2018-12-13 MED ORDER — AMLODIPINE BESYLATE 5 MG PO TABS
5.0000 mg | ORAL_TABLET | Freq: Every day | ORAL | Status: DC
  Administered 2018-12-13 – 2018-12-14 (×2): 5 mg via ORAL
  Filled 2018-12-13 (×2): qty 1

## 2018-12-13 MED ORDER — HYDROCHLOROTHIAZIDE 25 MG PO TABS
25.0000 mg | ORAL_TABLET | Freq: Every day | ORAL | Status: DC
  Administered 2018-12-13 – 2018-12-14 (×2): 25 mg via ORAL
  Filled 2018-12-13 (×2): qty 1

## 2018-12-13 MED ORDER — ENSURE ENLIVE PO LIQD
237.0000 mL | Freq: Two times a day (BID) | ORAL | Status: DC
  Administered 2018-12-13 – 2018-12-18 (×10): 237 mL via ORAL

## 2018-12-13 NOTE — Consult Note (Signed)
WOC Nurse wound consult note Patient receiving care in [facility] [phone].  No family present. Reason for Consult: sacral wound Wound type: MASD from moisture and fecal incontinence Measurement: Bilateral buttocks, the gluteal fold, coccyx and a small area of the sacrum have been impacted by liquid fecal incontinence and moisture.  All the the area is pink, except for a 3 cm x 2 cm area at the coccyx that appears as thin yellow, slick.  Surrounding areas are normal in color and texture. Dressing procedure/placement/frequency: Cleanse the open wound areas on the buttocks, gluteal fold, upper posterior thigh with warm soapy water.  Pat dry. Sprinkle Stoma powder [place] # 6) over the raw areas.  Spread the powder to cover all raw sites.  Using as many Skin Prep pads [place] [phone]) as needed, DAB (don't rub) the skin prep over the powdered areas.  Wait a few seconds for this to dry. Place a foam dressing over the areas.  The foam dressings can be left in place up to 3 days.  To perform intervention with an existing dressing, just peel it down and then put it back in place.  I will add turning and linen instructions, and an air mattress to the patient's interventions. Monitor the wound area(s) for worsening of condition such as: Signs/symptoms of infection,  Increase in size,  Development of or worsening of odor, Development of pain, or increased pain at the affected locations.  Notify the medical team if any of these develop.  Thank you for the consult.  Discussed plan of care with the patient and bedside nurse.  WOC nurse will not follow at this time.  Please re-consult the WOC team if needed.  Helmut Muster, RN, MSN, CWOCN, CNS-BC, pager [phone]

## 2018-12-13 NOTE — Progress Notes (Signed)
Physical Therapy Treatment Patient Details Name: Mason Hart MRN: [id number] DOB: August 11, 1956 Today's Date: 12/13/2018    History of Present Illness Pt is a 63 y/o male admitted admit 1/8 after seizure at home, intubated 1/8-1/10. PMH includes: bradycardia, AMS, anemia, hypoglycemia, hypokalemia, hypothermia, hyponatremia, ETOH abuse, R clav fracture    PT Comments    Pt making fair progress with mobility. He remains limited secondary to fatigue and weakness. He continues to require physical assistance with bed mobility and transfers. Pt would continue to benefit from skilled physical therapy services at this time while admitted and after d/c to address the below listed limitations in order to improve overall safety and independence with functional mobility.    Follow Up Recommendations  SNF     Equipment Recommendations  None recommended by PT    Recommendations for Other Services       Precautions / Restrictions Precautions Precautions: Fall Restrictions Weight Bearing Restrictions: No    Mobility  Bed Mobility Overal bed mobility: Needs Assistance Bed Mobility: Supine to Sit     Supine to sit: Min assist     General bed mobility comments: increased time and effort, pt able to get bilateral LEs to EOB, assist needed for trunk elevation  Transfers Overall transfer level: Needs assistance Equipment used: Rolling walker (2 wheeled) Transfers: Sit to/from Stand Sit to Stand: Mod assist;+2 physical assistance         General transfer comment: increased time and effort, pt pulling up on RW with bilateral UEs despite cueing for safe hand placement, assist to power into standing and for stability. Pt also unable to position L LE up underneath of him appropriately prior to standing  Ambulation/Gait Ambulation/Gait assistance: Min guard Gait Distance (Feet): 75 Feet Assistive device: Rolling walker (2 wheeled) Gait Pattern/deviations: Trunk flexed;Decreased step length  - left;Decreased step length - right;Step-through pattern;Decreased stride length;Shuffle Gait velocity: decreased   General Gait Details: pt maintaining a flexed posture as well as bilateral knees in flexion throughout; min guard and close chair follow needed   Stairs             Wheelchair Mobility    Modified Rankin (Stroke Patients Only)       Balance Overall balance assessment: Needs assistance Sitting-balance support: Feet supported Sitting balance-Leahy Scale: Fair     Standing balance support: Bilateral upper extremity supported Standing balance-Leahy Scale: Poor                              Cognition Arousal/Alertness: Awake/alert Behavior During Therapy: WFL for tasks assessed/performed Overall Cognitive Status: Impaired/Different from baseline Area of Impairment: Following commands;Safety/judgement;Problem solving                       Following Commands: Follows one step commands consistently Safety/Judgement: Decreased awareness of safety;Decreased awareness of deficits   Problem Solving: Slow processing;Difficulty sequencing;Requires verbal cues        Exercises      General Comments        Pertinent Vitals/Pain Pain Assessment: No/denies pain Faces Pain Scale: Hurts even more Pain Location: R UE Pain Descriptors / Indicators: Grimacing;Guarding Pain Intervention(s): Repositioned;Monitored during session    Home Living                      Prior Function            PT Goals (current goals can now  be found in the care plan section) Acute Rehab PT Goals PT Goal Formulation: With patient Time For Goal Achievement: 12/21/18 Potential to Achieve Goals: Good Progress towards PT goals: Progressing toward goals    Frequency    Min 2X/week      PT Plan Current plan remains appropriate    Co-evaluation              AM-PAC PT "6 Clicks" Mobility   Outcome Measure  Help needed turning from  your back to your side while in a flat bed without using bedrails?: A Little Help needed moving from lying on your back to sitting on the side of a flat bed without using bedrails?: A Little Help needed moving to and from a bed to a chair (including a wheelchair)?: A Little Help needed standing up from a chair using your arms (e.g., wheelchair or bedside chair)?: A Lot Help needed to walk in hospital room?: A Little Help needed climbing 3-5 steps with a railing? : Total 6 Click Score: 15    End of Session Equipment Utilized During Treatment: Gait belt Activity Tolerance: Patient limited by fatigue Patient left: in chair;with call bell/phone within reach;with chair alarm set Nurse Communication: Mobility status PT Visit Diagnosis: Other abnormalities of gait and mobility (R26.89);Muscle weakness (generalized) (M62.81);Pain Pain - Right/Left: Right Pain - part of body: Arm     Time: 1451-1515 PT Time Calculation (min) (ACUTE ONLY): 24 min  Charges:  $Gait Training: 23-37 mins                     Deborah Chalk, [place], DPT  Acute Rehabilitation Services Pager [phone] Office [phone]     Alessandra Bevels Channon Brougher 12/13/2018, 4:19 PM

## 2018-12-13 NOTE — Progress Notes (Signed)
Mason Hart  WUJ:[id number] DOB: Sep 17, 1956 DOA: 12/04/2018 PCP: [name], [name], [name]    Brief Narrative:  63 yo M w/ a hx of seizure disorder who [facility] after having a seizure at home. He was last seen well around 3 PM on 1/8 and was subsequently found that evening with active status epilepticus. He was bradycardic with HR 30s and required TC pacing. He was also found to be hypothermic (81*) though he was in his home when he had the seizure. He was not protecting his airway so he was intubated in the ED. His seizure broke with ativan but then he became completely unresponsive. He was loaded with keppra by Neurology then his dose was increased to 1 g BID. EEG showed diffuse generalized irregular slowing. He remained hypotensive despite propofol being stopped, he was started on levophed. ABG BMP with acute respiratory and metabolic acidosis with gap. ETOH level 14. K 3.1, mag 2.0, CK >700. CT head showed no acute findings. He was initially started on propofol but then this was stopped by the bradycardia and QT prolongation.   Patient extubated, weaned off pressors transferred to hospitalist service. Failed swallow evaluation.  Had barium swallow.    Subjective: Denies any acute complaint no nausea no vomiting no fever no chills.  Assessment & Plan:  Seizures, post ictal  s/p keppra load - continue keppra at increased dose of 1000 mg BID as per Neurology - etiology felt to be noncompliance w/ home meds No further seizures here in the hospital.  Acute hypercarbic respiratory acidosis -  aspiration pneumonia Extubated and able to maintain his airway - has completed a course of Zosyn - no sign of active infection presently   Acute metabolic encephalopathy related to prolonged postictal state  appears to be slowly recovering   Shock - Persistent hypotension  Required levophed - now off pressors - resolved w/ pt now hypertensive   AKI Resolved w/ volume  expansion  Rhabdomyolysis  CK has improved to normal range w/ volume expansion   Bradycardia 2/2 hypothermia resolved   Prolonged QTc Monitoring on tele   Hypokalemia  Supplement to goal of 4.0  ?GI Bleed  NGT previously with bloody output - Hgb 11 at admit- no further evidence of bleeding  ETOH use  no signs of alcohol withdrawal  Elevated transaminase  elevated in pattern consistent with ETOH use - LFTs now normal   Chronic hyponatremia  ?beer potomania and hypovolemia - Na has normalized with IV fluids  Bilateral upper extremity swelling. Negative Doppler examination for any DVT.  Persistent hypoglycemia. Likely secondary to poor p.o. intake. Patient is now on less restricted diet therefore anticipating improvement in oral intake. was on D5 half-normal saline with potassium.  Now D10 due to persistent hypoglycemia.  Now that the oral intake is improving, will reduce it to 10 cc/h. Monitor.  Dysphagia. After extubation patient did have some dysphagia.  Was on restricted diet, currently on regular diet per speech therapy.  Essential hypertension. The patient was started on clonidine in the stepdown unit. Currently blood pressure is well controlled with clonidine can be associated with hypoglycemia. We will switch to combination of Norvasc and HCTZ and monitor overnight.  Body mass index is 35.57 kg/m.   Malnutrition Type: Nutrition Problem: Inadequate oral intake Etiology: inability to eat Nutrition Interventions: Interventions: Ensure Enlive (each supplement provides 350kcal and 20 grams of protein)  Stage II sacral pressure ulcer. Pressure Injury 11/15/18 Stage II -  Partial thickness  loss of dermis presenting as a shallow open ulcer with a red, pink wound bed without slough. red (Active)  11/15/18 2030  Location: Sacrum  Location Orientation: Mid  Staging: Stage II -  Partial thickness loss of dermis presenting as a shallow open ulcer with a red,  pink wound bed without slough.  Wound Description (Comments): red  Present on Admission:      DVT prophylaxis: SQ heparin  Code Status: FULL CODE Family Communication: no family present at time of exam  Disposition Plan: Transfer to telemetry.  Discharge to SNF when medically stable.  Hopefully in couple of days.  Consultants:  Neurology  PCCM  Antimicrobials:  None presently   Objective: Blood pressure 127/62, pulse 82, temperature 98.5 F (36.9 C), temperature source Oral, resp. rate 20, height 5\' 8"  (1.727 m), weight 106.1 kg, SpO2 100 %.  Intake/Output Summary (Last 24 hours) at 12/13/2018 1658 Last data filed at 12/13/2018 1230 Gross per 24 hour  Intake 640 ml  Output 2075 ml  Net -1435 ml   Filed Weights   12/11/18 0500 12/12/18 0500 12/13/18 0500  Weight: 102.3 kg 101.6 kg 106.1 kg    Examination: General: No acute respiratory distress Lungs: Clear to auscultation bilaterally without wheezes or crackles Cardiovascular: Regular rate and rhythm without murmur gallop or rub normal S1 and S2 Abdomen: Nontender, nondistended, soft, bowel sounds positive, no rebound, no ascites, no appreciable mass Extremities: No significant cyanosis, clubbing, or edema bilateral lower extremities  CBC: Recent Labs  Lab 12/11/18 0316 12/12/18 0500 12/13/18 0605  WBC 5.1 6.9 5.6  NEUTROABS 2.2  --   --   HGB 8.7* 9.1* 8.6*  HCT 28.0* 30.2* 28.2*  MCV 85.1 86.3 86.2  PLT 153 174 155   Basic Metabolic Panel: Recent Labs  Lab 12/07/18 0749 12/08/18 0319 12/09/18 0240 12/10/18 0352 12/11/18 0316 12/12/18 0500 12/13/18 0605  NA 139 138 139 139 136 137 136  K 3.4* 3.6 3.2* 3.4* 4.9 3.8 4.0  CL 111 112* 112* 110 106 105 105  CO2 22 20* 21* 23 21* 26 24  GLUCOSE 124* 88 105* 84 77 87 85  BUN 9 7* 8 8 9  7* 10  CREATININE 0.91 0.71 0.73 0.84 0.68 0.94 0.83  CALCIUM 8.5* 8.7* 8.7* 8.6* 8.5* 8.8* 8.7*  MG 2.2 2.0 1.8 1.6* 1.8 1.7  --   PHOS 3.2 3.0 3.2  --   --   --   --     GFR: Estimated Creatinine Clearance: 106.2 mL/min (by C-G formula based on SCr of 0.83 mg/dL).  Liver Function Tests: Recent Labs  Lab 12/07/18 0749 12/08/18 0319 12/09/18 0240 12/11/18 0316  AST 44* 42* 31 37  ALT 25 30 28 31   ALKPHOS 44 49 51 48  BILITOT 0.4 0.5 0.7 0.8  PROT 5.8* 6.3* 6.4* 6.1*  ALBUMIN 2.1* 2.2* 2.2* 2.1*    Cardiac Enzymes: Recent Labs  Lab 12/08/18 0319 12/09/18 0240  CKTOTAL 459* 224     Recent Results (from the past 240 hour(s))  Culture, blood (routine x 2)     Status: None   Collection Time: 12/04/18 10:09 PM  Result Value Ref Range Status   Specimen Description BLOOD LEFT HAND  Final   Special Requests   Final    BOTTLES DRAWN AEROBIC ONLY Blood Culture results may not be optimal due to an inadequate volume of blood received in culture bottles   Culture   Final    NO GROWTH 5 DAYS  Performed at [facility], [address], [place], [place] [address]    Report Status 12/09/2018 FINAL  Final  Culture, blood (routine x 2)     Status: None   Collection Time: 12/04/18 10:09 PM  Result Value Ref Range Status   Specimen Description BLOOD RIGHT HAND  Final   Special Requests   Final    BOTTLES DRAWN AEROBIC AND ANAEROBIC Blood Culture adequate volume   Culture   Final    NO GROWTH 5 DAYS Performed at [facility], [address], [place], [place] [address]    Report Status 12/09/2018 FINAL  Final  Urine culture     Status: None   Collection Time: 12/04/18 10:09 PM  Result Value Ref Range Status   Specimen Description URINE, RANDOM  Final   Special Requests NONE  Final   Culture   Final    NO GROWTH Performed at [facility], [address] [place], [place] [address]    Report Status 12/06/2018 FINAL  Final  Culture, respiratory (tracheal aspirate)     Status: None   Collection Time: 12/04/18 11:41 PM  Result Value Ref Range Status   Specimen Description TRACHEAL ASPIRATE  Final   Special Requests NONE  Final    Gram Stain   Final    ABUNDANT WBC PRESENT,BOTH PMN AND MONONUCLEAR ABUNDANT GRAM POSITIVE COCCI FEW GRAM VARIABLE ROD Performed at [facility], [address], [place], [place] [address]    Culture ABUNDANT Consistent with normal respiratory flora.  Final   Report Status 12/07/2018 FINAL  Final  MRSA PCR Screening     Status: None   Collection Time: 12/04/18 11:41 PM  Result Value Ref Range Status   MRSA by PCR NEGATIVE NEGATIVE Final    Comment:        The GeneXpert MRSA Assay (FDA approved for NASAL specimens only), is one component of a comprehensive MRSA colonization surveillance program. It is not intended to diagnose MRSA infection nor to guide or monitor treatment for MRSA infections. Performed at [facility], [address], [place], [place] [address]      Scheduled Meds: . amLODipine  5 mg Oral Daily  . feeding supplement (ENSURE ENLIVE)  237 mL Oral BID BM  . folic acid  1 mg Oral Daily  . heparin injection (subcutaneous)  5,000 Units Subcutaneous Q8H  . hydrochlorothiazide  25 mg Oral Daily  . levETIRAcetam  1,000 mg Oral BID  . mouth rinse  15 mL Mouth Rinse BID  . pantoprazole  40 mg Oral BID  . tamsulosin  0.4 mg Oral QPC supper  . thiamine  100 mg Oral Daily   Continuous Infusions: . sodium chloride Stopped (12/10/18 0125)     LOS: 9 days   Author:  Lynden Oxford, MD [facility] 12/13/2018  If 7PM-7AM, please contact night-coverage To reach On-call, see [email or web address]   If 7PM-7AM, please contact night-coverage per Amion 12/13/2018, 4:58 PM

## 2018-12-13 NOTE — Progress Notes (Signed)
CSW met with patient to discuss bed offers. Patient said that he lost the paper that previous CSW gave him with facility options. CSW printed out bed offers for patient, and he said he would review them. CSW asked about any family that could help patient make decision, and patient said he would look them over and make the decision himself.  CSW to follow.  Laveda Abbe, [place] Clinical Social Worker [phone]

## 2018-12-14 LAB — CBC
HCT: 28.4 % — ABNORMAL LOW (ref 39.0–52.0)
Hemoglobin: 9 g/dL — ABNORMAL LOW (ref 13.0–17.0)
MCH: 27.1 pg (ref 26.0–34.0)
MCHC: 31.7 g/dL (ref 30.0–36.0)
MCV: 85.5 fL (ref 80.0–100.0)
Platelets: 188 10*3/uL (ref 150–400)
RBC: 3.32 MIL/uL — ABNORMAL LOW (ref 4.22–5.81)
RDW: 16.3 % — ABNORMAL HIGH (ref 11.5–15.5)
WBC: 5.7 10*3/uL (ref 4.0–10.5)
nRBC: 0 % (ref 0.0–0.2)

## 2018-12-14 LAB — GLUCOSE, CAPILLARY
GLUCOSE-CAPILLARY: 84 mg/dL (ref 70–99)
Glucose-Capillary: 69 mg/dL — ABNORMAL LOW (ref 70–99)
Glucose-Capillary: 75 mg/dL (ref 70–99)
Glucose-Capillary: 83 mg/dL (ref 70–99)
Glucose-Capillary: 87 mg/dL (ref 70–99)
Glucose-Capillary: 88 mg/dL (ref 70–99)
Glucose-Capillary: 96 mg/dL (ref 70–99)
Glucose-Capillary: 97 mg/dL (ref 70–99)

## 2018-12-14 LAB — BASIC METABOLIC PANEL
Anion gap: 9 (ref 5–15)
BUN: 8 mg/dL (ref 8–23)
CO2: 26 mmol/L (ref 22–32)
Calcium: 8.8 mg/dL — ABNORMAL LOW (ref 8.9–10.3)
Chloride: 101 mmol/L (ref 98–111)
Creatinine, Ser: 0.73 mg/dL (ref 0.61–1.24)
GFR calc Af Amer: >60 mL/min (ref >60)
GFR calc non Af Amer: >60 mL/min (ref >60)
Glucose, Bld: 89 mg/dL (ref 70–99)
Potassium: 3.6 mmol/L (ref 3.5–5.1)
Sodium: 136 mmol/L (ref 135–145)

## 2018-12-14 MED ORDER — LOPERAMIDE HCL 2 MG PO CAPS
2.0000 mg | ORAL_CAPSULE | ORAL | Status: DC | PRN
  Administered 2018-12-14 – 2018-12-17 (×5): 2 mg via ORAL
  Filled 2018-12-14 (×5): qty 1

## 2018-12-14 MED ORDER — METOPROLOL TARTRATE 25 MG PO TABS
25.0000 mg | ORAL_TABLET | Freq: Two times a day (BID) | ORAL | Status: DC
  Administered 2018-12-14 – 2018-12-18 (×8): 25 mg via ORAL
  Filled 2018-12-14 (×9): qty 1

## 2018-12-14 NOTE — Progress Notes (Signed)
Mason Hart  GBT:[id number] DOB: [birth year] DOA: 12/04/2018 PCP: [name], [name]    Brief Narrative:  63 yo M w/ a hx of seizure disorder who presented to [facility] ED after having a seizure at home. He was last seen well around 3 PM on 1/8 and was subsequently found that evening with active status epilepticus. He was bradycardic with HR 30s and required TC pacing. He was also found to be hypothermic (81*) though he was in his home when he had the seizure. He was not protecting his airway so he was intubated in the ED. His seizure broke with ativan but then he became completely unresponsive. He was loaded with keppra by Neurology then his dose was increased to 1 g BID. EEG showed diffuse generalized irregular slowing. He remained hypotensive despite propofol being stopped, he was started on levophed. ABG BMP with acute respiratory and metabolic acidosis with gap. ETOH level 14. K 3.1, mag 2.0, CK >700. CT head showed no acute findings. He was initially started on propofol but then this was stopped by the bradycardia and QT prolongation.   Patient extubated, weaned off pressors transferred to hospitalist service. Failed swallow evaluation.  Had barium swallow.    Subjective: Reports to have diarrhea 4 episodes since yesterday.  No blood in the stool.  No abdominal pain.  No nausea no vomiting.  No fever no chills.  Assessment & Plan:  Seizures, post ictal  s/p keppra load - continue keppra at increased dose of 1000 mg BID as per Neurology - etiology felt to be noncompliance w/ home meds No further seizures here in the hospital.  Acute hypercarbic respiratory acidosis -  aspiration pneumonia Extubated and able to maintain his airway - has completed a course of Zosyn - no sign of active infection presently   Acute metabolic encephalopathy related to prolonged postictal state  appears to be slowly recovering   Shock - Persistent hypotension  Required levophed - now off pressors - resolved  w/ pt now hypertensive   AKI Resolved w/ volume expansion  Rhabdomyolysis  CK has improved to normal range w/ volume expansion   Bradycardia 2/2 hypothermia resolved   Prolonged QTc Monitoring on tele   Hypokalemia  Supplement to goal of 4.0  ?GI Bleed  NGT previously with bloody output - Hgb 11 at admit- no further evidence of bleeding  ETOH use  no signs of alcohol withdrawal  Elevated transaminase  elevated in pattern consistent with ETOH use - LFTs now normal   Chronic hyponatremia  ?beer potomania and hypovolemia - Na has normalized with IV fluids  Bilateral upper extremity swelling. Negative Doppler examination for any DVT.  Persistent hypoglycemia. Likely secondary to poor p.o. intake. Patient is now on less restricted diet therefore anticipating improvement in oral intake. was on D5 half-normal saline with potassium.  Now D10 due to persistent hypoglycemia.  Now that the oral intake is improving, will reduce it to 10 cc/h. Monitor.  Dysphagia. After extubation patient did have some dysphagia.  Was on restricted diet, currently on regular diet per speech therapy.  Essential hypertension. The patient was started on clonidine in the stepdown unit. Currently blood pressure is well controlled with clonidine can be associated with hypoglycemia. We will switch to combination of Norvasc and Lopressor  Diarrhea. Discontinue PPI. Start on Imodium as needed.  No leukocytosis no evidence of C. difficile.  No bleeding.  Body mass index is 36.19 kg/m.   Malnutrition Type: Nutrition Problem: Inadequate oral intake  Etiology: inability to eat Nutrition Interventions: Interventions: Ensure Enlive (each supplement provides 350kcal and 20 grams of protein)  Stage II sacral pressure ulcer. Pressure Injury 11/15/18 Stage II -  Partial thickness loss of dermis presenting as a shallow open ulcer with a red, pink wound bed without slough. red (Active)  11/15/18 2030   Location: Sacrum  Location Orientation: Mid  Staging: Stage II -  Partial thickness loss of dermis presenting as a shallow open ulcer with a red, pink wound bed without slough.  Wound Description (Comments): red  Present on Admission:      DVT prophylaxis: SQ heparin  Code Status: FULL CODE Family Communication: no family present at time of exam  Disposition Plan: Transfer to telemetry.  Discharge to SNF when medically stable.  Hopefully in couple of days.  Consultants:  Neurology  PCCM  Antimicrobials:  None presently   Objective: Blood pressure 108/60, pulse 96, temperature 98.4 F (36.9 C), temperature source Oral, resp. rate 17, height 5\' 8"  (1.727 m), weight 108 kg, SpO2 99 %.  Intake/Output Summary (Last 24 hours) at 12/14/2018 1305 Last data filed at 12/14/2018 0900 Gross per 24 hour  Intake 720 ml  Output 3500 ml  Net -2780 ml   Filed Weights   12/12/18 0500 12/13/18 0500 12/14/18 0500  Weight: 101.6 kg 106.1 kg 108 kg    Examination: General: No acute respiratory distress Lungs: Clear to auscultation bilaterally without wheezes or crackles Cardiovascular: Regular rate and rhythm without murmur gallop or rub normal S1 and S2 Abdomen: Nontender, nondistended, soft, bowel sounds positive, no rebound, no ascites, no appreciable mass Extremities: No significant cyanosis, clubbing, or edema bilateral lower extremities  CBC: Recent Labs  Lab 12/11/18 0316 12/12/18 0500 12/13/18 0605 12/14/18 0535  WBC 5.1 6.9 5.6 5.7  NEUTROABS 2.2  --   --   --   HGB 8.7* 9.1* 8.6* 9.0*  HCT 28.0* 30.2* 28.2* 28.4*  MCV 85.1 86.3 86.2 85.5  PLT 153 174 155 188   Basic Metabolic Panel: Recent Labs  Lab 12/08/18 0319 12/09/18 0240 12/10/18 0352 12/11/18 0316 12/12/18 0500 12/13/18 0605 12/14/18 0535  NA 138 139 139 136 137 136 136  K 3.6 3.2* 3.4* 4.9 3.8 4.0 3.6  CL 112* 112* 110 106 105 105 101  CO2 20* 21* 23 21* 26 24 26   GLUCOSE 88 105* 84 77 87 85 89  BUN  7* 8 8 9  7* 10 8  CREATININE 0.71 0.73 0.84 0.68 0.94 0.83 0.73  CALCIUM 8.7* 8.7* 8.6* 8.5* 8.8* 8.7* 8.8*  MG 2.0 1.8 1.6* 1.8 1.7  --   --   PHOS 3.0 3.2  --   --   --   --   --    GFR: Estimated Creatinine Clearance: 111.1 mL/min (by C-G formula based on SCr of 0.73 mg/dL).  Liver Function Tests: Recent Labs  Lab 12/08/18 0319 12/09/18 0240 12/11/18 0316  AST 42* 31 37  ALT 30 28 31   ALKPHOS 49 51 48  BILITOT 0.5 0.7 0.8  PROT 6.3* 6.4* 6.1*  ALBUMIN 2.2* 2.2* 2.1*    Cardiac Enzymes: Recent Labs  Lab 12/08/18 0319 12/09/18 0240  CKTOTAL 459* 224     Recent Results (from the past 240 hour(s))  Culture, blood (routine x 2)     Status: None   Collection Time: 12/04/18 10:09 PM  Result Value Ref Range Status   Specimen Description BLOOD LEFT HAND  Final   Special Requests  Final    BOTTLES DRAWN AEROBIC ONLY Blood Culture results may not be optimal due to an inadequate volume of blood received in culture bottles   Culture   Final    NO GROWTH 5 DAYS Performed at [facility], [address] [place], [place] [address]    Report Status 12/09/2018 FINAL  Final  Culture, blood (routine x 2)     Status: None   Collection Time: 12/04/18 10:09 PM  Result Value Ref Range Status   Specimen Description BLOOD RIGHT HAND  Final   Special Requests   Final    BOTTLES DRAWN AEROBIC AND ANAEROBIC Blood Culture adequate volume   Culture   Final    NO GROWTH 5 DAYS Performed at [facility], [address] [place], [place] [address]    Report Status 12/09/2018 FINAL  Final  Urine culture     Status: None   Collection Time: 12/04/18 10:09 PM  Result Value Ref Range Status   Specimen Description URINE, RANDOM  Final   Special Requests NONE  Final   Culture   Final    NO GROWTH Performed at [facility], [address] [place], [place] [address]    Report Status 12/06/2018 FINAL  Final  Culture, respiratory (tracheal aspirate)     Status: None    Collection Time: 12/04/18 11:41 PM  Result Value Ref Range Status   Specimen Description TRACHEAL ASPIRATE  Final   Special Requests NONE  Final   Gram Stain   Final    ABUNDANT WBC PRESENT,BOTH PMN AND MONONUCLEAR ABUNDANT GRAM POSITIVE COCCI FEW GRAM VARIABLE ROD Performed at [facility], [address] [place], [place] [address]    Culture ABUNDANT Consistent with normal respiratory flora.  Final   Report Status 12/07/2018 FINAL  Final  MRSA PCR Screening     Status: None   Collection Time: 12/04/18 11:41 PM  Result Value Ref Range Status   MRSA by PCR NEGATIVE NEGATIVE Final    Comment:        The GeneXpert MRSA Assay (FDA approved for NASAL specimens only), is one component of a comprehensive MRSA colonization surveillance program. It is not intended to diagnose MRSA infection nor to guide or monitor treatment for MRSA infections. Performed at [facility], [address] [place], [place] [address]      Scheduled Meds: . amLODipine  5 mg Oral Daily  . feeding supplement (ENSURE ENLIVE)  237 mL Oral BID BM  . folic acid  1 mg Oral Daily  . heparin injection (subcutaneous)  5,000 Units Subcutaneous Q8H  . levETIRAcetam  1,000 mg Oral BID  . mouth rinse  15 mL Mouth Rinse BID  . metoprolol tartrate  25 mg Oral BID  . tamsulosin  0.4 mg Oral QPC supper  . thiamine  100 mg Oral Daily   Continuous Infusions: . sodium chloride Stopped (12/10/18 0125)     LOS: 10 days   Author:  Lynden OxfordPranav Patel, MD [facility] 12/14/2018  If 7PM-7AM, please contact night-coverage To reach On-call, see [email or web address]   If 7PM-7AM, please contact night-coverage per Amion 12/14/2018, 1:05 PM

## 2018-12-15 LAB — BASIC METABOLIC PANEL
Anion gap: 7 (ref 5–15)
BUN: 7 mg/dL — ABNORMAL LOW (ref 8–23)
CO2: 29 mmol/L (ref 22–32)
Calcium: 8.7 mg/dL — ABNORMAL LOW (ref 8.9–10.3)
Chloride: 101 mmol/L (ref 98–111)
Creatinine, Ser: 0.67 mg/dL (ref 0.61–1.24)
GFR calc Af Amer: >60 mL/min (ref >60)
GFR calc non Af Amer: >60 mL/min (ref >60)
Glucose, Bld: 83 mg/dL (ref 70–99)
Potassium: 3.8 mmol/L (ref 3.5–5.1)
Sodium: 137 mmol/L (ref 135–145)

## 2018-12-15 LAB — GLUCOSE, CAPILLARY
GLUCOSE-CAPILLARY: 76 mg/dL (ref 70–99)
Glucose-Capillary: 99 mg/dL (ref 70–99)
Glucose-Capillary: 90 mg/dL (ref 70–99)
Glucose-Capillary: 95 mg/dL (ref 70–99)
Glucose-Capillary: 72 mg/dL (ref 70–99)

## 2018-12-15 LAB — CBC
HCT: 27.2 % — ABNORMAL LOW (ref 39.0–52.0)
Hemoglobin: 8.6 g/dL — ABNORMAL LOW (ref 13.0–17.0)
MCH: 27.3 pg (ref 26.0–34.0)
MCHC: 31.6 g/dL (ref 30.0–36.0)
MCV: 86.3 fL (ref 80.0–100.0)
Platelets: 212 10*3/uL (ref 150–400)
RBC: 3.15 MIL/uL — ABNORMAL LOW (ref 4.22–5.81)
RDW: 16.7 % — ABNORMAL HIGH (ref 11.5–15.5)
WBC: 6.3 10*3/uL (ref 4.0–10.5)
nRBC: 0 % (ref 0.0–0.2)

## 2018-12-15 MED ORDER — AMLODIPINE BESYLATE 2.5 MG PO TABS
2.5000 mg | ORAL_TABLET | Freq: Every day | ORAL | Status: DC
  Administered 2018-12-15: 2.5 mg via ORAL
  Filled 2018-12-15: qty 1

## 2018-12-15 NOTE — Progress Notes (Signed)
Jl Mandler  GNF:[id number] DOB: March 17, 1956 DOA: 12/04/2018 PCP: [name], [name]    Brief Narrative:  63 yo M w/ a hx of seizure disorder who presented to [facility] ED after having a seizure at home. He was last seen well around 3 PM on 1/8 and was subsequently found that evening with active status epilepticus. He was bradycardic with HR 30s and required TC pacing. He was also found to be hypothermic (81*) though he was in his home when he had the seizure. He was not protecting his airway so he was intubated in the ED. His seizure broke with ativan but then he became completely unresponsive. He was loaded with keppra by Neurology then his dose was increased to 1 g BID. EEG showed diffuse generalized irregular slowing. He remained hypotensive despite propofol being stopped, he was started on levophed. ABG BMP with acute respiratory and metabolic acidosis with gap. ETOH level 14. K 3.1, mag 2.0, CK >700. CT head showed no acute findings. He was initially started on propofol but then this was stopped by the bradycardia and QT prolongation.   Patient extubated, weaned off pressors transferred to hospitalist service. Failed swallow evaluation.  Had barium swallow.    Subjective: No acute complaints.  No nausea no vomiting no fever no chills.  Diarrhea improved.  No abdominal pain.  Assessment & Plan:  Seizures, post ictal  s/p keppra load - continue keppra at increased dose of 1000 mg BID as per Neurology - etiology felt to be noncompliance w/ home meds No further seizures here in the hospital.  Acute hypercarbic respiratory failure with respiratory acidosis -  aspiration pneumonia Extubated and able to maintain his airway - has completed a course of Zosyn - no sign of active infection presently   Acute metabolic encephalopathy related to prolonged postictal state  appears to be slowly recovering   Shock - Persistent hypotension  Required levophed - now off pressors - resolved w/ pt now  hypertensive   AKI Resolved w/ volume expansion  Rhabdomyolysis  CK has improved to normal range w/ volume expansion   Bradycardia 2/2 hypothermia resolved   Prolonged QTc Monitoring on tele   Hypokalemia  Supplement to goal of 4.0  ?GI Bleed  NGT previously with bloody output - Hgb 11 at admit- no further evidence of bleeding  ETOH use  no signs of alcohol withdrawal  Elevated transaminase  elevated in pattern consistent with ETOH use - LFTs now normal   Chronic hyponatremia  ?beer potomania and hypovolemia - Na has normalized with IV fluids  Bilateral upper extremity swelling. Negative Doppler examination for any DVT.  Persistent hypoglycemia. Likely secondary to poor p.o. intake. Patient is now on less restricted diet therefore anticipating improvement in oral intake. was on D5 half-normal saline with potassium.  Followed by D10 due to persistent hypoglycemia. Now off of all the drips and sugars are well controlled.  Dysphagia. After extubation patient did have some dysphagia.  Was on restricted diet, currently on regular diet per speech therapy.  Essential hypertension. Hypotension. The patient was started on clonidine in the stepdown unit. Currently blood pressure is well controlled with clonidine can be associated with hypoglycemia. The blood pressure is soft again. We will hold Norvasc.  Continue with low-dose Lopressor.  Diarrhea.  Resolved Discontinue PPI. Start on Imodium as needed.  No leukocytosis no evidence of C. difficile.  No bleeding.  Body mass index is 36.2 kg/m.   Nutrition Problem: Inadequate oral intake Etiology: inability  to eat Nutrition Interventions: Interventions: Ensure Enlive (each supplement provides 350kcal and 20 grams of protein)  Stage II mid sacral pressure ulcer.  Undermine POA status. Pressure Injury 11/15/18 Stage II -  Partial thickness loss of dermis presenting as a shallow open ulcer with a red, pink wound  bed without slough. red (Active)  11/15/18 2030  Location: Sacrum  Location Orientation: Mid  Staging: Stage II -  Partial thickness loss of dermis presenting as a shallow open ulcer with a red, pink wound bed without slough.  Wound Description (Comments): red  Present on Admission:      DVT prophylaxis: SQ heparin  Code Status: FULL CODE Family Communication: no family present at time of exam  Disposition Plan: Transfer to telemetry.  Discharge to SNF when medically stable.  Hopefully in couple of days.  Consultants:  Neurology  PCCM  Antimicrobials:  None presently   Objective: Blood pressure (!) 93/47, pulse 83, temperature 98.1 F (36.7 C), temperature source Oral, resp. rate 16, height 5\' 8"  (1.727 m), weight 108 kg, SpO2 100 %.  Intake/Output Summary (Last 24 hours) at 12/15/2018 1401 Last data filed at 12/15/2018 1200 Gross per 24 hour  Intake 840 ml  Output -  Net 840 ml   Filed Weights   12/13/18 0500 12/14/18 0500 12/15/18 0500  Weight: 106.1 kg 108 kg 108 kg    Examination: General: No acute respiratory distress Lungs: Clear to auscultation bilaterally without wheezes or crackles Cardiovascular: Regular rate and rhythm without murmur gallop or rub normal S1 and S2 Abdomen: Nontender, nondistended, soft, bowel sounds positive, no rebound, no ascites, no appreciable mass Extremities: No significant cyanosis, clubbing, or edema bilateral lower extremities  CBC: Recent Labs  Lab 12/11/18 0316  12/13/18 0605 12/14/18 0535 12/15/18 0618  WBC 5.1   < > 5.6 5.7 6.3  NEUTROABS 2.2  --   --   --   --   HGB 8.7*   < > 8.6* 9.0* 8.6*  HCT 28.0*   < > 28.2* 28.4* 27.2*  MCV 85.1   < > 86.2 85.5 86.3  PLT 153   < > 155 188 212   < > = values in this interval not displayed.   Basic Metabolic Panel: Recent Labs  Lab 12/09/18 0240 12/10/18 0352 12/11/18 0316 12/12/18 0500 12/13/18 0605 12/14/18 0535 12/15/18 0618  NA 139 139 136 137 136 136 137  K 3.2*  3.4* 4.9 3.8 4.0 3.6 3.8  CL 112* 110 106 105 105 101 101  CO2 21* 23 21* 26 24 26 29   GLUCOSE 105* 84 77 87 85 89 83  BUN 8 8 9  7* 10 8 7*  CREATININE 0.73 0.84 0.68 0.94 0.83 0.73 0.67  CALCIUM 8.7* 8.6* 8.5* 8.8* 8.7* 8.8* 8.7*  MG 1.8 1.6* 1.8 1.7  --   --   --   PHOS 3.2  --   --   --   --   --   --    GFR: Estimated Creatinine Clearance: 111.1 mL/min (by C-G formula based on SCr of 0.67 mg/dL).  Liver Function Tests: Recent Labs  Lab 12/09/18 0240 12/11/18 0316  AST 31 37  ALT 28 31  ALKPHOS 51 48  BILITOT 0.7 0.8  PROT 6.4* 6.1*  ALBUMIN 2.2* 2.1*    Cardiac Enzymes: Recent Labs  Lab 12/09/18 0240  CKTOTAL 224     No results found for this or any previous visit (from the past 240 hour(s)).  Scheduled Meds: . feeding supplement (ENSURE ENLIVE)  237 mL Oral BID BM  . folic acid  1 mg Oral Daily  . heparin injection (subcutaneous)  5,000 Units Subcutaneous Q8H  . levETIRAcetam  1,000 mg Oral BID  . mouth rinse  15 mL Mouth Rinse BID  . metoprolol tartrate  25 mg Oral BID  . tamsulosin  0.4 mg Oral QPC supper  . thiamine  100 mg Oral Daily   Continuous Infusions: . sodium chloride Stopped (12/10/18 0125)     LOS: 11 days   Author:  Lynden Oxford, MD [facility] 12/15/2018  If 7PM-7AM, please contact night-coverage To reach On-call, see [email or web address]   If 7PM-7AM, please contact night-coverage per Amion 12/15/2018, 2:01 PM

## 2018-12-16 LAB — GLUCOSE, CAPILLARY
GLUCOSE-CAPILLARY: 92 mg/dL (ref 70–99)
Glucose-Capillary: 86 mg/dL (ref 70–99)
Glucose-Capillary: 105 mg/dL — ABNORMAL HIGH (ref 70–99)
Glucose-Capillary: 107 mg/dL — ABNORMAL HIGH (ref 70–99)
Glucose-Capillary: 106 mg/dL — ABNORMAL HIGH (ref 70–99)

## 2018-12-16 LAB — CBC
HCT: 26.2 % — ABNORMAL LOW (ref 39.0–52.0)
HEMOGLOBIN: 8.1 g/dL — AB (ref 13.0–17.0)
MCH: 27 pg (ref 26.0–34.0)
MCHC: 30.9 g/dL (ref 30.0–36.0)
MCV: 87.3 fL (ref 80.0–100.0)
Platelets: 224 10*3/uL (ref 150–400)
RBC: 3 MIL/uL — AB (ref 4.22–5.81)
RDW: 16.9 % — ABNORMAL HIGH (ref 11.5–15.5)
WBC: 7 10*3/uL (ref 4.0–10.5)
nRBC: 0 % (ref 0.0–0.2)

## 2018-12-16 LAB — BASIC METABOLIC PANEL
Anion gap: 8 (ref 5–15)
BUN: 11 mg/dL (ref 8–23)
CALCIUM: 8.7 mg/dL — AB (ref 8.9–10.3)
CO2: 28 mmol/L (ref 22–32)
Chloride: 100 mmol/L (ref 98–111)
Creatinine, Ser: 0.65 mg/dL (ref 0.61–1.24)
GFR calc Af Amer: >60 mL/min (ref >60)
GFR calc non Af Amer: >60 mL/min (ref >60)
Glucose, Bld: 87 mg/dL (ref 70–99)
Potassium: 3.9 mmol/L (ref 3.5–5.1)
Sodium: 136 mmol/L (ref 135–145)

## 2018-12-16 NOTE — Progress Notes (Signed)
CSW contacted [facility], as patient has chosen them for SNF, to ask them to initiate insurance authorization. Insurance likely closed today for holiday, but [facility] will alert CSW when authorization is received.  Blenda Nicely, [place] Clinical Social Worker [phone]

## 2018-12-16 NOTE — Progress Notes (Signed)
Nutrition Follow-up  DOCUMENTATION CODES:   Not applicable  INTERVENTION:  Continue Ensure Enlive po BID, each supplement provides 350 kcal and 20 grams of protein  Encourage adequate PO intake.   NUTRITION DIAGNOSIS:   Inadequate oral intake related to inability to eat as evidenced by NPO status; diet advanced; improved  GOAL:   Patient will meet greater than or equal to 90% of their needs; met  MONITOR:   PO intake, Supplement acceptance  REASON FOR ASSESSMENT:   Ventilator, Consult Enteral/tube feeding initiation and management  ASSESSMENT:   63 yo male with PMH of seizures, medication noncompliance, alcohol abuse who was admitted with seizure, hypothermia, severe bradycardia. Intubated on admission. Extubated 1/10.   Pt is currently on a regular diet with thin liquids. Meal completion has been 100%. Pt currently has Ensure ordered and has been consuming them. RD to continue with current orders to aid in caloric and protein needs as well as in wound healing.   Labs and medications reviewed.   Diet Order:   Diet Order            Diet regular Room service appropriate? Yes; Fluid consistency: Thin  Diet effective now              EDUCATION NEEDS:   No education needs have been identified at this time  Skin:  Skin Assessment: Skin Integrity Issues: Skin Integrity Issues:: Stage II Stage II: sacrum  Last BM:  1/18  Height:   Ht Readings from Last 1 Encounters:  12/05/18 '5\' 8"'  (1.727 m)    Weight:   Wt Readings from Last 1 Encounters:  12/16/18 110 kg    Ideal Body Weight:  70 kg  BMI:  Body mass index is 36.87 kg/m.  Estimated Nutritional Needs:   Kcal:  2000-2200  Protein:  105-125 gm  Fluid:  2.1 L    Corrin Parker, MS, RD, LDN Pager # [phone] After hours/ weekend pager # [phone]

## 2018-12-16 NOTE — Progress Notes (Signed)
Mason Hart  UJW:[id number] DOB: Apr 19, 1956 DOA: 12/04/2018 PCP: [name], [name]    Brief Narrative:  63 yo M w/ a hx of seizure disorder who presented to [facility] ED after having a seizure at home. He was last seen well around 3 PM on 1/8 and was subsequently found that evening with active status epilepticus. He was bradycardic with HR 30s and required TC pacing. He was also found to be hypothermic (81*) though he was in his home when he had the seizure. He was not protecting his airway so he was intubated in the ED. His seizure broke with ativan but then he became completely unresponsive. He was loaded with keppra by Neurology then his dose was increased to 1 g BID. EEG showed diffuse generalized irregular slowing. He remained hypotensive despite propofol being stopped, he was started on levophed. ABG BMP with acute respiratory and metabolic acidosis with gap. ETOH level 14. K 3.1, mag 2.0, CK >700. CT head showed no acute findings. He was initially started on propofol but then this was stopped by the bradycardia and QT prolongation.   Patient extubated, weaned off pressors transferred to hospitalist service. Failed swallow evaluation.  Had barium swallow.    Subjective: No acute complaints.   Assessment & Plan:  Seizures, post ictal  s/p keppra load - continue keppra at increased dose of 1000 mg BID as per Neurology - etiology felt to be noncompliance w/ home meds No further seizures here in the hospital.  Acute hypercarbic respiratory failure with respiratory acidosis -  aspiration pneumonia Extubated and able to maintain his airway - has completed a course of Zosyn - no sign of active infection presently   Acute metabolic encephalopathy related to prolonged postictal state  appears to be slowly recovering   Shock - Persistent hypotension  Required levophed - now off pressors - resolved w/ pt now hypertensive   AKI Resolved w/ volume expansion  Rhabdomyolysis  CK has  improved to normal range w/ volume expansion   Bradycardia 2/2 hypothermia resolved   Prolonged QTc Monitoring on tele   Hypokalemia  Supplement to goal of 4.0  ?GI Bleed  NGT previously with bloody output - Hgb 11 at admit- no further evidence of bleeding  ETOH use  no signs of alcohol withdrawal  Elevated transaminase  elevated in pattern consistent with ETOH use - LFTs now normal   Chronic hyponatremia  ?beer potomania and hypovolemia - Na has normalized with IV fluids  Bilateral upper extremity swelling. Negative Doppler examination for any DVT.  Persistent hypoglycemia. Likely secondary to poor p.o. intake. Patient is now on less restricted diet therefore anticipating improvement in oral intake. was on D5 half-normal saline with potassium.  Followed by D10 due to persistent hypoglycemia. Now off of all the drips and sugars are well controlled.  Dysphagia. After extubation patient did have some dysphagia.  Was on restricted diet, currently on regular diet per speech therapy.  Essential hypertension. Hypotension. The patient was started on clonidine in the stepdown unit. Currently blood pressure is well controlled with clonidine can be associated with hypoglycemia. The blood pressure is soft again. We will hold Norvasc.  Continue with low-dose Lopressor.  Diarrhea.  Resolved Discontinue PPI. Start on Imodium as needed.  No leukocytosis no evidence of C. difficile.  No bleeding.  Body mass index is 36.87 kg/m.   Nutrition Problem: Inadequate oral intake Etiology: inability to eat Nutrition Interventions: Interventions: Ensure Enlive (each supplement provides 350kcal and 20 grams of  protein)  Stage II mid sacral pressure ulcer.  Undermine POA status. Pressure Injury 11/15/18 Stage II -  Partial thickness loss of dermis presenting as a shallow open ulcer with a red, pink wound bed without slough. red (Active)  11/15/18 2030  Location: Sacrum  Location  Orientation: Mid  Staging: Stage II -  Partial thickness loss of dermis presenting as a shallow open ulcer with a red, pink wound bed without slough.  Wound Description (Comments): red  Present on Admission:      DVT prophylaxis: SQ heparin  Code Status: FULL CODE Family Communication: no family present at time of exam  Disposition Plan: awaiting placement.   Consultants:  Neurology  PCCM  Antimicrobials:  None presently   Objective: Blood pressure 114/63, pulse 74, temperature 99.8 F (37.7 C), temperature source Oral, resp. rate 18, height 5\' 8"  (1.727 m), weight 110 kg, SpO2 100 %.  Intake/Output Summary (Last 24 hours) at 12/16/2018 1815 Last data filed at 12/16/2018 0600 Gross per 24 hour  Intake 480 ml  Output -  Net 480 ml   Filed Weights   12/15/18 0500 12/15/18 2000 12/16/18 0407  Weight: 108 kg 110 kg 110 kg    Examination: General: No acute respiratory distress Lungs: Clear to auscultation bilaterally without wheezes or crackles Cardiovascular: Regular rate and rhythm without murmur gallop or rub normal S1 and S2 Abdomen: Nontender, nondistended, soft, bowel sounds positive, no rebound, no ascites, no appreciable mass Extremities: No significant cyanosis, clubbing, or edema bilateral lower extremities  CBC: Recent Labs  Lab 12/11/18 0316  12/14/18 0535 12/15/18 0618 12/16/18 0624  WBC 5.1   < > 5.7 6.3 7.0  NEUTROABS 2.2  --   --   --   --   HGB 8.7*   < > 9.0* 8.6* 8.1*  HCT 28.0*   < > 28.4* 27.2* 26.2*  MCV 85.1   < > 85.5 86.3 87.3  PLT 153   < > 188 212 224   < > = values in this interval not displayed.   Basic Metabolic Panel: Recent Labs  Lab 12/10/18 0352 12/11/18 0316 12/12/18 0500  12/14/18 0535 12/15/18 0618 12/16/18 0624  NA 139 136 137   < > 136 137 136  K 3.4* 4.9 3.8   < > 3.6 3.8 3.9  CL 110 106 105   < > 101 101 100  CO2 23 21* 26   < > 26 29 28   GLUCOSE 84 77 87   < > 89 83 87  BUN 8 9 7*   < > 8 7* 11  CREATININE 0.84  0.68 0.94   < > 0.73 0.67 0.65  CALCIUM 8.6* 8.5* 8.8*   < > 8.8* 8.7* 8.7*  MG 1.6* 1.8 1.7  --   --   --   --    < > = values in this interval not displayed.   GFR: Estimated Creatinine Clearance: 112.2 mL/min (by C-G formula based on SCr of 0.65 mg/dL).  Liver Function Tests: Recent Labs  Lab 12/11/18 0316  AST 37  ALT 31  ALKPHOS 48  BILITOT 0.8  PROT 6.1*  ALBUMIN 2.1*    Cardiac Enzymes: No results for input(s): CKTOTAL, CKMB, CKMBINDEX, TROPONINI in the last 168 hours.   No results found for this or any previous visit (from the past 240 hour(s)).   Scheduled Meds: . feeding supplement (ENSURE ENLIVE)  237 mL Oral BID BM  . folic acid  1 mg  Oral Daily  . heparin injection (subcutaneous)  5,000 Units Subcutaneous Q8H  . levETIRAcetam  1,000 mg Oral BID  . mouth rinse  15 mL Mouth Rinse BID  . metoprolol tartrate  25 mg Oral BID  . tamsulosin  0.4 mg Oral QPC supper  . thiamine  100 mg Oral Daily   Continuous Infusions: . sodium chloride Stopped (12/10/18 0125)     LOS: 12 days   Author:  Lynden Oxford, MD [facility] 12/16/2018  If 7PM-7AM, please contact night-coverage To reach On-call, see [email or web address]

## 2018-12-17 LAB — GLUCOSE, CAPILLARY
GLUCOSE-CAPILLARY: 84 mg/dL (ref 70–99)
Glucose-Capillary: 78 mg/dL (ref 70–99)

## 2018-12-17 MED ORDER — METOPROLOL TARTRATE 25 MG PO TABS: 25.0000 mg | ORAL_TABLET | Freq: Two times a day (BID) | ORAL | 0 refills | Status: DC

## 2018-12-17 MED ORDER — ENSURE ENLIVE PO LIQD: 237.0000 mL | Freq: Three times a day (TID) | ORAL | 0 refills | Status: DC

## 2018-12-17 MED ORDER — THIAMINE HCL 100 MG PO TABS: 100.0000 mg | ORAL_TABLET | Freq: Every day | ORAL | 0 refills | Status: AC

## 2018-12-17 MED ORDER — TAMSULOSIN HCL 0.4 MG PO CAPS: 0.4000 mg | ORAL_CAPSULE | Freq: Every day | ORAL | 0 refills | Status: AC

## 2018-12-17 MED ORDER — LEVETIRACETAM 1000 MG PO TABS: 1000.0000 mg | ORAL_TABLET | Freq: Two times a day (BID) | ORAL | 0 refills | Status: DC

## 2018-12-17 NOTE — Discharge Summary (Signed)
[facility] Discharge Summary   Patient: Mason Hart GEX:[id number]   PCP: [name], [name] DOB: 1956/08/13   Date of admission: 12/04/2018   Date of discharge:  12/17/2018    Discharge Diagnoses:  Principal diagnosis Status epilepticus Principal Problem:   Seizures (HCC) Active Problems:   Hypokalemia   Hypoglycemia   Anemia, normocytic normochromic   Endotracheally intubated   Hypothermia   Hyponatremia   Postictal state (HCC)   Sinusitis   Abnormal CXR   Hypotension   Bradycardia   Admitted From: home Disposition:  SNF  Recommendations for Outpatient Follow-up:  1. Please follow-up with PCP in 1 week. 2. Refer to neurology outpatient. 3. Do not drive for 6 months until cleared by PCP or neurology.   Contact information for follow-up providers    [name], [name]. Schedule an appointment as soon as possible for a visit in 1 week(s).   Specialty:  Family Medicine Contact information: [address] [place] [address] [phone]            Contact information for after-discharge care    Destination    [facility] Preferred SNF .   Service:  Skilled Nursing Contact information: [address] [place] [address] [phone]                 Diet recommendation: regular diet  Activity: The patient is advised to gradually reintroduce usual activities.  Discharge Condition: good  Code Status: full code  History of present illness: As per the H and P dictated on admission, "Mason Hart is a 63 y.o. male who has a PMH as outlined below including but not limited to seizures (see "past medical history").  He presented to [facility] ED 1/8 after he had a seizure at his home.  He was apparently post ictal upon EMS arrival with HR in the 30's.  EMS was starting TC pacing, but HR improved.  Upon arrival to ED, he had another seizure which was treated with ativan.  He was later intubated for airway  protection.  In ED, he was noted to be hypothermic to 68F (Per EMS, pt was not outside prior to or during seizure and was inside the home).  He was placed on a warming blanket.  Labs were noteable for K of 3.1, CK 731. EKG showed QTc of 628.    He was seen by neurology and loaded with Keppra (is on Keppra as an outpatient).  PCCM was later called for ICU admission."  Hospital Course:   Summary of his active problems in the hospital is as following.  Status epilepticus Acute encephalopathy secondary to postictal state Presented on 12/04/2018 with status epilepticus. Intubated and admitted to the ICU.  On 10/17/2019 Neurology was consulted. s/p keppra load, at his baseline the patient was on 500 mg Keppra as well as Dilantin. Decision was made to transition to Keppra only at 1000 mg twice daily. etiology felt to be noncompliance w/ home meds No further seizures here in the hospital. Outpatient neurology referral.  Acute hypercarbic respiratory failure with respiratory acidosis -  aspiration pneumonia Extubated and able to maintain his airway has completed a course of Zosyn no sign of active infection presently   Acute metabolic encephalopathy related to prolonged postictal state  appears to be slowly recovering   Shock - Persistent hypotension  Required levophed  now off pressors  resolved w/ pt now hypertensive   AKI Resolved w/ volume expansion  Rhabdomyolysis  CK  has improved to normal range w/ volume expansion   Bradycardia 2/2 hypothermia Initially requiring Transcutaneous pacing.  Improved and later on actually tachycardic at his baseline.  Prolonged QTc 552 on 12/05/2018. No PVCs no acute abnormalities on telemetry. Avoid QT prolonging medications.  Hypokalemia  Replaced.  GI Bleed  Plan the patient had NGT, it had previously some bloody output  Hgb 11 at admit, remained stable between 8 and 9 no evidence of bleeding  ETOH use  Monitored on CIWA  protocol No signs of alcohol withdrawal Continue thiamine outpatient  Elevated transaminase  elevated in pattern consistent with ETOH use LFTs now normal   Chronic hyponatremia-resolved beer potomania and hypovolemia  Na has normalizedwith IV fluids  Bilateral upper extremity swelling. Negative Doppler examination for any DVT. Patient is mobilizing fluid on his own with improvement in edema.  Persistent hypoglycemia. Likely secondary to poor p.o. intake.  Resolved Continue regular diet although would recommend checking CBG every morning for next 1 week. Was on D5 half-normal saline with potassium.   Followed by D10 due to persistent hypoglycemia. Now off of all the drips and sugars are well controlled.  Dysphagia. After extubation patient did have some dysphagia.  Was on restricted diet, currently on regular diet per speech therapy.  Essential hypertension. After initial hypotension patient's blood pressure was rather elevated. The patient was started on clonidine in the stepdown unit. Currently blood pressure is well controlled with clonidine can be associated with hypoglycemia. Continue with low-dose Lopressor.  Diarrhea.  Resolved Discontinue PPI. Start on Imodium as needed.  No leukocytosis no evidence of C. difficile. No bleeding.  Obesity. Dietary consultation outpatient.  Body mass index is 36.87 kg/m.   Nutrition Problem: Inadequate oral intake Etiology: inability to eat Nutrition Interventions: Interventions: Ensure Enlive (each supplement provides 350kcal and 20 grams of protein)  Pressure ulcer, mid sacrum. Unspecified POA status. Pressure Injury 11/15/18 Stage II -  Partial thickness loss of dermis presenting as a shallow open ulcer with a red, pink wound bed without slough. red (Active)  11/15/18 2030  Location: Sacrum  Location Orientation: Mid  Staging: Stage II -  Partial thickness loss of dermis presenting as a shallow open ulcer with a  red, pink wound bed without slough.  Wound Description (Comments): red  Present on Admission:    Patient was seen by physical therapy, who recommended SNF, which was arranged by Child psychotherapist. On the day of the discharge the patient's vitals were stable , and no other acute medical condition were reported by patient. the patient was felt safe to be discharge at SNF with therapy.  Consultants: Primary admission with PCCM Neurology Procedures: EEG Intubation and extubation Echocardiogram  DISCHARGE MEDICATION: Allergies as of 12/17/2018   No Known Allergies     Medication List    STOP taking these medications   oxycodone 5 MG capsule Commonly known as:  OXY-IR   phenytoin 100 MG ER capsule Commonly known as:  DILANTIN     TAKE these medications   feeding supplement (ENSURE ENLIVE) Liqd Take 237 mLs by mouth 3 (three) times daily between meals.   folic acid 1 MG tablet Commonly known as:  FOLVITE Take 1 tablet (1 mg total) by mouth daily.   levETIRAcetam 1000 MG tablet Commonly known as:  KEPPRA Take 1 tablet (1,000 mg total) by mouth 2 (two) times daily. What changed:    medication strength  how much to take   metoprolol tartrate 25 MG tablet Commonly known as:  LOPRESSOR Take 1 tablet (25 mg total) by mouth 2 (two) times daily.   multivitamin with minerals Tabs tablet Take 1 tablet by mouth daily.   tamsulosin 0.4 MG Caps capsule Commonly known as:  FLOMAX Take 1 capsule (0.4 mg total) by mouth daily after supper.   thiamine 100 MG tablet Take 1 tablet (100 mg total) by mouth daily for 30 days.      No Known Allergies Discharge Instructions    Ambulatory referral to Neurology   Complete by:  As directed    An appointment is requested in approximately: 8 weeks, admitted for status epilepticus.   Diet - low sodium heart healthy   Complete by:  As directed    Discharge instructions   Complete by:  As directed    It is important that you read the given  instructions as well as go over your medication list with RN to help you understand your care after this hospitalization.  Discharge Instructions: Please follow-up with PCP in 1-2 weeks  Please request your primary care physician to go over all Hospital Tests and Procedure/Radiological results at the follow up. Please get all Hospital records sent to your PCP by signing hospital release before you go home.   Do not drive, operating heavy machinery, perform activities at heights, swimming or participation in water activities or provide baby sitting services because you were admitted for seizures; until you have been seen by Primary Care Physician or a Neurologist and advised to do so again. Do not take more than prescribed Pain, Sleep and Anxiety Medications. You were cared for by a hospitalist during your hospital stay. If you have any questions about your discharge medications or the care you received while you were in the hospital after you are discharged, you can call the unit @UNIT @ you were admitted to and ask to speak with the hospitalist on call if the hospitalist that took care of you is not available.  Once you are discharged, your primary care physician will handle any further medical issues. Please note that NO REFILLS for any discharge medications will be authorized once you are discharged, as it is imperative that you return to your primary care physician (or establish a relationship with a primary care physician if you do not have one) for your aftercare needs so that they can reassess your need for medications and monitor your lab values. You Must read complete instructions/literature along with all the possible adverse reactions/side effects for all the Medicines you take and that have been prescribed to you. Take any new Medicines after you have completely understood and accept all the possible adverse reactions/side effects. Wear Seat belts while driving. If you have smoked or chewed  Tobacco in the last 2 yrs please stop smoking and/or stop any Recreational drug use.  If you drink alcohol, please moderate the use and do not drive, operating heavy machinery, perform activities at heights, swimming or participation in water activities or provide baby sitting services under influence.   Driving Restrictions   Complete by:  As directed    Do not drive.   Increase activity slowly   Complete by:  As directed    Other Restrictions   Complete by:  As directed    Per [facility] statutes, patients with seizures are not allowed to drive until  they have been seizure-free for six months.  Use caution when using heavy equipment or power tools.  Avoid working on ladders or at heights.  Take  showers instead of baths. Ensure the water temperature is not too high on the home water heater. Do not go swimming alone.  When caring for infants or small children, sit down when holding, feeding, or changing them to minimize risk of injury to the child in the event you have a seizure.  Maintain good sleep hygiene. Avoid alcohol.     Discharge Exam: Filed Weights   12/15/18 0500 12/15/18 2000 12/16/18 0407  Weight: 108 kg 110 kg 110 kg   Vitals:   12/17/18 0750 12/17/18 1136  BP: (!) 141/76 (!) 144/72  Pulse: 69 64  Resp: 16 14  Temp: 100.2 F (37.9 C) 98.5 F (36.9 C)  SpO2: 98% 100%   General: Appear in no distress, no Rash; Oral Mucosa moist. Cardiovascular: S1 and S2 Present, no Murmur, no JVD Respiratory: Bilateral Air entry present and Clear to Auscultation, no Crackles, no wheezes Abdomen: Bowel Sound present, Soft and no tenderness Extremities: no Pedal edema, no calf tenderness Neurology: Grossly no focal neuro deficit.  The results of significant diagnostics from this hospitalization (including imaging, microbiology, ancillary and laboratory) are listed below for reference.    Significant Diagnostic Studies: Ct Head Wo Contrast  Result Date: 12/04/2018 CLINICAL  DATA:  63 y/o M; patient presents with seizure activity. History of seizures. EXAM: CT HEAD WITHOUT CONTRAST TECHNIQUE: Contiguous axial images were obtained from the base of the skull through the vertex without intravenous contrast. COMPARISON:  11/14/2018 MRI head.  11/13/2018 CT head. FINDINGS: Brain: No evidence of acute infarction, hemorrhage, hydrocephalus, extra-axial collection or mass lesion/mass effect. Stable calcific atherosclerosis and volume loss of the brain. Vascular: No hyperdense vessel or unexpected calcification. Skull: Normal. Negative for fracture or focal lesion. Sinuses/Orbits: Nasoenteric tube. Debris within the nasopharynx. Left frontal, ethmoid, and maxillary sinus opacification with chronic inflammatory changes of the sinus walls. Moderate diffuse mucosal thickening of the additional visible paranasal sinuses. Normal aeration of mastoid air cells. Orbits are unremarkable. Other: None. IMPRESSION: 1. No acute intracranial abnormality identified. 2. Stable chronic microvascular ischemic changes and volume loss of the brain. 3. Chronic left frontal, ethmoid, and maxillary sinus disease, a left middle meatus obstructive pattern. Electronically Signed   By: Mitzi Hansen M.D.   On: 12/04/2018 22:42   Dg Chest Port 1 View  Result Date: 12/07/2018 CLINICAL DATA:  Respiratory failure. EXAM: PORTABLE CHEST 1 VIEW COMPARISON:  Radiograph of December 06, 2018. FINDINGS: Stable cardiomediastinal silhouette. Endotracheal and nasogastric tubes have been removed. No pneumothorax or pleural effusion is noted. Right lung is clear. Minimal left basilar subsegmental atelectasis is noted. Bony thorax is unremarkable. IMPRESSION: Endotracheal and nasogastric tubes have been removed. Minimal left basilar subsegmental atelectasis. Electronically Signed   By: Lupita Raider, M.D.   On: 12/07/2018 08:10   Dg Chest Port 1 View  Result Date: 12/06/2018 CLINICAL DATA:  Respiratory failure. EXAM:  PORTABLE CHEST 1 VIEW COMPARISON:  Radiographs of December 05, 2018. FINDINGS: Stable cardiomediastinal silhouette. Endotracheal and nasogastric tubes are unchanged in position. Right lung is clear. No pneumothorax or pleural effusion is noted. Mild left basilar atelectasis is noted. Bony thorax is unremarkable. IMPRESSION: Endotracheal and nasogastric tubes are unchanged in position. Mild left basilar subsegmental atelectasis. Electronically Signed   By: Lupita Raider, M.D.   On: 12/06/2018 07:24   Dg Chest Port 1 View  Result Date: 12/05/2018 CLINICAL DATA:  Respiratory failure. EXAM: PORTABLE CHEST 1 VIEW COMPARISON:  12/04/2018. FINDINGS: Endotracheal tube, NG tube in stable position. Heart  size normal. Mild bibasilar atelectasis/infiltrate noted today's exam. Small left pleural effusion can not be excluded. No pneumothorax. Left clavicular fracture again noted. IMPRESSION: 1.  Lines and tubes stable position. 2. Mild bibasilar atelectasis/infiltrates noted on today's exam. Small left pleural effusion. 3.  Left clavicular fracture again noted. Electronically Signed   By: Maisie Fus  Register   On: 12/05/2018 05:56   Dg Chest Portable 1 View  Result Date: 12/04/2018 CLINICAL DATA:  Seizure. Bradycardia upon arrival. History of seizures. Intubation. EXAM: PORTABLE CHEST 1 VIEW COMPARISON:  11/13/2018 FINDINGS: The endotracheal tube tip is 3.6 cm above the carina, satisfactorily positioned. Nasogastric tube enters the stomach. Defibrillator and leads are present. Questionable retrocardiac airspace opacity. The lungs appear otherwise clear. As shown on 11/13/2018, there is a considerably displaced and possibly overlap fracture of the left mid clavicle. IMPRESSION: 1. Endotracheal tube tip 3.6 cm above the carina, satisfactorily positioned. 2. Questionable retrocardiac airspace opacity-atelectasis or pneumonia not entirely excluded. 3. Mid shaft displaced in overlapped clavicular fracture on the left, as shown on  11/13/2018. Electronically Signed   By: Gaylyn Rong M.D.   On: 12/04/2018 20:33   Dg Swallowing Func-speech Pathology  Result Date: 12/07/2018 Objective Swallowing Evaluation: Type of Study: MBS-Modified Barium Swallow Study  Patient Details Name: Duran Coello MRN: [id number] Date of Birth: [birth year] Today's Date: 12/07/2018 Time: SLP Start Time (ACUTE ONLY): 1345 -SLP Stop Time (ACUTE ONLY): 1415 SLP Time Calculation (min) (ACUTE ONLY): 30 min Past Medical History: Past Medical History: Diagnosis Date . Noncompliance with medication regimen  . Seizures (HCC)  Past Surgical History: No past surgical history on file. HPI: Larri Hamel is a 63 y.o. male who has a PMH of seizures.   He presented to [facility] ED 1/8 after he had a seizure at his home.  He was apparently post ictal upon EMS arrival with HR in the 30's.  He also has a history of ETOH.  EMS was starting TC pacing, but HR improved.  Upon arrival to ED, he had another seizure which was treated with ativan.  He was later intubated for airway protection.  In ED, he was noted to be hypothermic to 11F (Per EMS, pt was not outside prior to or during seizure and was inside the home).  He was placed on a warming blanket.  Labs were noteable for K of 3.1, CK 731.  EKG showed QTc of 628.   He was seen by neurology and loaded with Keppra (is on Keppra as an outpatient).  PCCM was later called for ICU admission and intubated for airway protection.  Most recent chest xray is showing minimal left basilar subsegmental atelectasis.  Head CT was showing no acute findings but stable chronic microvascular ischemic changes and volume loss.   Subjective: The patient was seen in radiology for MBS. Assessment / Plan / Recommendation CHL IP CLINICAL IMPRESSIONS 12/07/2018 Clinical Impression MBS was completed using thin liquids via spoon, cup and straw, nectar thick liquids via cup, pureed material and dual textured solids.  The patient presented with a moderate oropharyngeal  dysphagia. The oral phase was marked by decreased lingual control and motion which lead to prematre loss of the bolus and delayed a/p transport of the bolus.  The patient was noted to have material fall into the buccal area but he was able to recollect most of this material so that there was not much oral residue seen.  Given the premature loss of the bolus material fell to the level of the pyriform  sinuses and soemtimes into the laryngeal vestibule prior to the swallow trigger.  The pharyngeal phase was marked by intermittently decreased epiglottic movement and decreased base of tongue retraction.   These deficits lead to mild vallecula residue across textures.  Intermittent mild pyriform sinus residue was seen as well.   Transient penetration was seen given thin liquids via spoon, cup and straw as well as necter thick liquids via cup sip prior to the swallow.  This material was noted to clear the laryngeal vestibule.  Esophageal sweep did not reveal overt issues.  Patient with intermittent cough through exam without the presence of aspiration.  Recommend that the patient begin a dysphagia 1 diet with thin liquids.  ST will follow up for therapueutic diet tolerance and to initiate swallowing therapy as well as possible diet advancemenet.  He may benefit from ST therapy at the next level of care.   SLP Visit Diagnosis Dysphagia, oropharyngeal phase (R13.12) Attention and concentration deficit following -- Frontal lobe and executive function deficit following -- Impact on safety and function Mild aspiration risk   CHL IP TREATMENT RECOMMENDATION 12/07/2018 Treatment Recommendations Therapy as outlined in treatment plan below   Prognosis 12/07/2018 Prognosis for Safe Diet Advancement Good Barriers to Reach Goals -- Barriers/Prognosis Comment -- CHL IP DIET RECOMMENDATION 12/07/2018 SLP Diet Recommendations Thin liquid;Dysphagia 1 (Puree) solids Liquid Administration via Cup;No straw Medication Administration Crushed with  puree Compensations Slow rate;Small sips/bites Postural Changes Seated upright at 90 degrees   CHL IP OTHER RECOMMENDATIONS 12/07/2018 Recommended Consults -- Oral Care Recommendations Oral care QID Other Recommendations --   CHL IP FOLLOW UP RECOMMENDATIONS 12/07/2018 Follow up Recommendations Other (comment)   CHL IP FREQUENCY AND DURATION 12/07/2018 Speech Therapy Frequency (ACUTE ONLY) min 2x/week Treatment Duration 2 weeks      CHL IP ORAL PHASE 12/07/2018 Oral Phase Impaired Oral - Pudding Teaspoon -- Oral - Pudding Cup -- Oral - Honey Teaspoon -- Oral - Honey Cup -- Oral - Nectar Teaspoon -- Oral - Nectar Cup Delayed oral transit;Decreased bolus cohesion;Premature spillage Oral - Nectar Straw -- Oral - Thin Teaspoon Decreased bolus cohesion;Premature spillage;Delayed oral transit Oral - Thin Cup Delayed oral transit;Decreased bolus cohesion;Premature spillage Oral - Thin Straw -- Oral - Puree Delayed oral transit;Decreased bolus cohesion;Premature spillage Oral - Mech Soft -- Oral - Regular -- Oral - Multi-Consistency Delayed oral transit;Decreased bolus cohesion;Premature spillage Oral - Pill -- Oral Phase - Comment --  CHL IP PHARYNGEAL PHASE 12/07/2018 Pharyngeal Phase Impaired Pharyngeal- Pudding Teaspoon -- Pharyngeal -- Pharyngeal- Pudding Cup -- Pharyngeal -- Pharyngeal- Honey Teaspoon -- Pharyngeal -- Pharyngeal- Honey Cup -- Pharyngeal -- Pharyngeal- Nectar Teaspoon -- Pharyngeal -- Pharyngeal- Nectar Cup Delayed swallow initiation-pyriform sinuses;Reduced tongue base retraction;Penetration/Aspiration before swallow;Pharyngeal residue - valleculae Pharyngeal Material enters airway, remains ABOVE vocal cords then ejected out Pharyngeal- Nectar Straw -- Pharyngeal -- Pharyngeal- Thin Teaspoon Delayed swallow initiation-pyriform sinuses;Reduced tongue base retraction;Reduced epiglottic inversion;Penetration/Aspiration before swallow;Pharyngeal residue - valleculae Pharyngeal Material enters airway, remains  ABOVE vocal cords then ejected out Pharyngeal- Thin Cup Delayed swallow initiation-pyriform sinuses;Reduced tongue base retraction;Penetration/Aspiration before swallow;Pharyngeal residue - valleculae Pharyngeal Material enters airway, remains ABOVE vocal cords then ejected out Pharyngeal- Thin Straw -- Pharyngeal -- Pharyngeal- Puree Delayed swallow initiation-pyriform sinuses;Reduced tongue base retraction;Pharyngeal residue - valleculae Pharyngeal -- Pharyngeal- Mechanical Soft -- Pharyngeal -- Pharyngeal- Regular -- Pharyngeal -- Pharyngeal- Multi-consistency Delayed swallow initiation-pyriform sinuses;Reduced tongue base retraction;Pharyngeal residue - valleculae Pharyngeal -- Pharyngeal- Pill -- Pharyngeal -- Pharyngeal Comment --  CHL IP  CERVICAL ESOPHAGEAL PHASE 12/07/2018 Cervical Esophageal Phase WFL Pudding Teaspoon -- Pudding Cup -- Honey Teaspoon -- Honey Cup -- Nectar Teaspoon -- Nectar Cup -- Nectar Straw -- Thin Teaspoon -- Thin Cup -- Thin Straw -- Puree -- Mechanical Soft -- Regular -- Multi-consistency -- Pill -- Cervical Esophageal Comment -- Dimas Aguas, MA, CCC-SLP Acute Rehab SLP [phone] Fleet Contras 12/07/2018, 3:03 PM              Vas [place] Upper Extremity Venous Duplex  Result Date: 12/11/2018 UPPER VENOUS STUDY  Indications: Pain, and Edema Limitations: Body habitus and line. Comparison Study: No prior study available Performing Technologist: Melodie Bouillon  Examination Guidelines: A complete evaluation includes B-mode imaging, spectral Doppler, color Doppler, and power Doppler as needed of all accessible portions of each vessel. Bilateral testing is considered an integral part of a complete examination. Limited examinations for reoccurring indications may be performed as noted.  Right Findings: +----------+------------+----------+---------+-----------+-------+ RIGHT     CompressiblePropertiesPhasicitySpontaneousSummary  +----------+------------+----------+---------+-----------+-------+ IJV           Full                 Yes       Yes            +----------+------------+----------+---------+-----------+-------+ Subclavian    Full                 Yes       Yes            +----------+------------+----------+---------+-----------+-------+ Axillary      Full                           Yes            +----------+------------+----------+---------+-----------+-------+ Brachial      Full                           Yes            +----------+------------+----------+---------+-----------+-------+ Radial        Full                                          +----------+------------+----------+---------+-----------+-------+ Ulnar         Full                                          +----------+------------+----------+---------+-----------+-------+ Cephalic      Full                                          +----------+------------+----------+---------+-----------+-------+ Basilic       Full                                          +----------+------------+----------+---------+-----------+-------+  Left Findings: +----------+------------+----------+---------+-----------+---------+ LEFT      CompressiblePropertiesPhasicitySpontaneous Summary  +----------+------------+----------+---------+-----------+---------+ IJV           Full                 Yes  Yes              +----------+------------+----------+---------+-----------+---------+ Subclavian    Full                 Yes       Yes              +----------+------------+----------+---------+-----------+---------+ Axillary      Full                           Yes    poor flow +----------+------------+----------+---------+-----------+---------+ Brachial      Full                           Yes              +----------+------------+----------+---------+-----------+---------+ Radial        Full                                             +----------+------------+----------+---------+-----------+---------+ Ulnar         Full                                            +----------+------------+----------+---------+-----------+---------+ Cephalic      Full                                            +----------+------------+----------+---------+-----------+---------+ Basilic       Full                                            +----------+------------+----------+---------+-----------+---------+  Summary: No evidence of deep vein or superficial vein thrombosis involving the right and left upper extremities.  *See table(s) above for measurements and observations.  Diagnosing physician: Gretta Began MD Electronically signed by Gretta Began MD on 12/11/2018 at 8:05:10 PM.    Final     Microbiology: No results found for this or any previous visit (from the past 240 hour(s)).   Labs: CBC: Recent Labs  Lab 12/11/18 0316 12/12/18 0500 12/13/18 0605 12/14/18 0535 12/15/18 0618 12/16/18 0624  WBC 5.1 6.9 5.6 5.7 6.3 7.0  NEUTROABS 2.2  --   --   --   --   --   HGB 8.7* 9.1* 8.6* 9.0* 8.6* 8.1*  HCT 28.0* 30.2* 28.2* 28.4* 27.2* 26.2*  MCV 85.1 86.3 86.2 85.5 86.3 87.3  PLT 153 174 155 188 212 224   Basic Metabolic Panel: Recent Labs  Lab 12/11/18 0316 12/12/18 0500 12/13/18 0605 12/14/18 0535 12/15/18 0618 12/16/18 0624  NA 136 137 136 136 137 136  K 4.9 3.8 4.0 3.6 3.8 3.9  CL 106 105 105 101 101 100  CO2 21* 26 24 26 29 28   GLUCOSE 77 87 85 89 83 87  BUN 9 7* 10 8 7* 11  CREATININE 0.68 0.94 0.83 0.73 0.67 0.65  CALCIUM 8.5* 8.8* 8.7* 8.8* 8.7* 8.7*  MG 1.8 1.7  --   --   --   --  Liver Function Tests: Recent Labs  Lab 12/11/18 0316  AST 37  ALT 31  ALKPHOS 48  BILITOT 0.8  PROT 6.1*  ALBUMIN 2.1*   No results for input(s): LIPASE, AMYLASE in the last 168 hours. No results for input(s): AMMONIA in the last 168 hours. Cardiac Enzymes: No results for  input(s): CKTOTAL, CKMB, CKMBINDEX, TROPONINI in the last 168 hours. BNP (last 3 results) No results for input(s): BNP in the last 8760 hours. CBG: Recent Labs  Lab 12/16/18 0811 12/16/18 1146 12/16/18 2030 12/17/18 0028 12/17/18 0748  GLUCAP 105* 107* 106* 84 78   Time spent: 35 minutes  Signed:  Lynden Oxford  [facility]  12/17/2018

## 2018-12-17 NOTE — Progress Notes (Signed)
Physical Therapy Treatment Patient Details Name: Mason Hart MRN: [id number] DOB: 11-12-1956 Today's Date: 12/17/2018    History of Present Illness Pt is a 63 y/o male admitted admit 1/8 after seizure at home, intubated 1/8-1/10. PMH includes: bradycardia, AMS, anemia, hypoglycemia, hypokalemia, hypothermia, hyponatremia, ETOH abuse, R clav fracture    PT Comments    Pt performed functional mobility and gait training to edge of bed.  Pt continues to require mod +2 to achieve standing and to achieve sitting at edge of bed.  Pt once in standing required min guard assistance to ambulate.  He function is largely limited due to his arthritic knees.  Plan next session for continued strengthening and progression of functional mobility.  Plan remains appropriate for rehab in a post acute setting.     Follow Up Recommendations  SNF     Equipment Recommendations  None recommended by PT    Recommendations for Other Services       Precautions / Restrictions Precautions Precautions: Fall Restrictions Weight Bearing Restrictions: No    Mobility  Bed Mobility Overal bed mobility: Needs Assistance Bed Mobility: Supine to Sit       Sit to supine: +2 for physical assistance;Mod assist   General bed mobility comments: increased time and effort, pt able to get bilateral LEs to EOB, assist needed for trunk elevation  Transfers Overall transfer level: Needs assistance Equipment used: Rolling walker (2 wheeled) Transfers: Sit to/from Stand Sit to Stand: Mod assist;+2 physical assistance         General transfer comment: Pt with decreased L knee flexion due to arthritic knee.  He requires use of R side and assistance to boost into standing until he can reposition his LLE underneath him  Ambulation/Gait Ambulation/Gait assistance: Min guard Gait Distance (Feet): 50 Feet Assistive device: Rolling walker (2 wheeled) Gait Pattern/deviations: Trunk flexed;Decreased step length -  left;Decreased step length - right;Step-through pattern;Decreased stride length;Shuffle Gait velocity: decreased   General Gait Details: pt maintaining a flexed posture as well as bilateral knees in flexion throughout; min guard and close chair follow needed.  Pt required constant cueing for posture and forward gaze.     Stairs             Wheelchair Mobility    Modified Rankin (Stroke Patients Only)       Balance Overall balance assessment: Needs assistance Sitting-balance support: Feet supported Sitting balance-Leahy Scale: Fair     Standing balance support: Bilateral upper extremity supported Standing balance-Leahy Scale: Poor Standing balance comment: Heavily reliant on UE support                            Cognition Arousal/Alertness: Awake/alert Behavior During Therapy: WFL for tasks assessed/performed Overall Cognitive Status: Impaired/Different from baseline Area of Impairment: Following commands;Safety/judgement;Problem solving;Orientation                 Orientation Level: Time;Disoriented to   Memory: Decreased short-term memory Following Commands: Follows one step commands consistently Safety/Judgement: Decreased awareness of safety;Decreased awareness of deficits Awareness: Intellectual Problem Solving: Slow processing;Difficulty sequencing;Requires verbal cues General Comments: pt agreeing he needs rehab prior to returning home      Exercises      General Comments        Pertinent Vitals/Pain Pain Assessment: 0-10 Pain Score: 6  Pain Location: buttocks Pain Descriptors / Indicators: Grimacing;Guarding Pain Intervention(s): Monitored during session;Repositioned(nurse in to reapply dressing change.  )  Home Living                      Prior Function            PT Goals (current goals can now be found in the care plan section) Acute Rehab PT Goals Patient Stated Goal: to get his strength and independence  back so he can return home.  Potential to Achieve Goals: Good Progress towards PT goals: Progressing toward goals    Frequency    Min 2X/week      PT Plan Current plan remains appropriate    Co-evaluation              AM-PAC PT "6 Clicks" Mobility   Outcome Measure  Help needed turning from your back to your side while in a flat bed without using bedrails?: A Little Help needed moving from lying on your back to sitting on the side of a flat bed without using bedrails?: A Little Help needed moving to and from a bed to a chair (including a wheelchair)?: A Little Help needed standing up from a chair using your arms (e.g., wheelchair or bedside chair)?: A Lot     6 Click Score: 11    End of Session Equipment Utilized During Treatment: Gait belt Activity Tolerance: Patient limited by fatigue Patient left: in chair;with call bell/phone within reach;with chair alarm set Nurse Communication: Mobility status PT Visit Diagnosis: Other abnormalities of gait and mobility (R26.89);Muscle weakness (generalized) (M62.81);Pain Pain - Right/Left: Right Pain - part of body: Arm     Time: 4010-2725 PT Time Calculation (min) (ACUTE ONLY): 26 min  Charges:  $Gait Training: 8-22 mins $Therapeutic Activity: 8-22 mins                     Joycelyn Rua, PTA Acute Rehabilitation Services Pager [phone] Office [phone]     Mason Hart 12/17/2018, 3:53 PM

## 2018-12-17 NOTE — Progress Notes (Signed)
Holding discharge due to diarrhea.  No abdominal pain, no fever or chills.  Monitor overnight.  Known loose BM, no concern for C diff.  Continue imodium which has not been given so far.  Author:  Lynden Oxford, MD [facility] 12/17/2018  If 7PM-7AM, please contact night-coverage To reach On-call, see [email or web address]

## 2018-12-18 LAB — BASIC METABOLIC PANEL
Anion gap: 8 (ref 5–15)
BUN: 15 mg/dL (ref 8–23)
CO2: 26 mmol/L (ref 22–32)
CREATININE: 0.69 mg/dL (ref 0.61–1.24)
Calcium: 8.7 mg/dL — ABNORMAL LOW (ref 8.9–10.3)
Chloride: 102 mmol/L (ref 98–111)
GFR calc Af Amer: >60 mL/min (ref >60)
GFR calc non Af Amer: >60 mL/min (ref >60)
Glucose, Bld: 97 mg/dL (ref 70–99)
Potassium: 3.5 mmol/L (ref 3.5–5.1)
SODIUM: 136 mmol/L (ref 135–145)

## 2018-12-18 LAB — MAGNESIUM: Magnesium: 1.9 mg/dL (ref 1.7–2.4)

## 2018-12-18 NOTE — Progress Notes (Addendum)
Progress Note  Dr. Lynden Oxford completed patient's discharge paperwork for DC to SNF on 1/21 but discharge had to be postponed due to some diarrhea.  I reviewed DC summary and briefly his chart.  I interviewed and examined patient with RN this morning.  Patient reports no complaints.  Specifically denies nausea, vomiting or abdominal pain.  Diarrhea has improved.  As per RN, had 1 BM overnight, received Imodium and has not had any since.  Physical exam:  Vitals:   12/18/18 0300 12/18/18 0303 12/18/18 0804 12/18/18 1256  BP:  (!) 145/60 127/61 (!) 89/41  Pulse:  76 75 80  Resp:  20 19 15   Temp:  97.7 F (36.5 C) (!) 100.5 F (38.1 C) 99.6 F (37.6 C)  TempSrc:  Oral Oral Oral  SpO2:  95% 97% 98%  Weight: 106 kg     Height:       General exam: Pleasant middle-aged male, moderately built and nourished lying comfortably propped up in bed.  Oral mucosa moist. Respiratory system: Clear to auscultation.  No increased work of breathing. Cardiovascular system: S1 and S2 heard, RRR.  No JVD, murmurs or pedal edema.  Telemetry personally reviewed: SR with BBB morphology. Gastrointestinal system: Abdomen is nondistended, soft and nontender.  No organomegaly or masses appreciated.  Normal bowel sounds heard. CNS: Alert and oriented to person, place and partly to time.  No focal neurological deficits.  Follows instructions appropriately. Extremities: Symmetric 5 x 5 power.  Labs BMP from today appreciated, unremarkable.  BUN 15, creatinine 0.69 and magnesium 1.9.  Assessment and plan:  1. Status epilepticus: Suspected due to medication noncompliance.  Neurology consulted this admission.  Dilantin was stopped.  Keppra dose was adjusted to 1 g twice daily.  Outpatient follow-up with Neurology, SNF to coordinate. 2. Acute encephalopathy: Suspected due to prolonged postictal state.  Mental status improved but baseline not clearly known.  He appears mostly oriented.  Outpatient follow-up. 3. Acute  respiratory failure/aspiration pneumonia: Treated and resolved. 4. Diarrhea: Unclear etiology.  Low index of suspicion for C. difficile in the absence of abdominal pain, high fevers or leukocytosis.  Improved.  PRN Imodium.  If persists or worsens, may consider further evaluation as outpatient. 5. Essential hypertension: Not on clonidine at this time.  Continue metoprolol.  Controlled.  Discussed with RN, clinical social work and plan to DC to SNF today.  Marcellus Scott, MD, FACP, [facility]. [facility] Pager [phone]  If 7PM-7AM, please contact night-coverage [email or web address] Password [email or web address] 12/18/2018, 1:14 PM

## 2018-12-19 NOTE — Clinical Social Work Placement (Signed)
Nurse to call report to [phone]      CLINICAL SOCIAL WORK PLACEMENT  NOTE  Date:  12/19/2018  Patient Details  Name: Mason Hart MRN: [id number] Date of Birth: [birth year]  Clinical Social Work is seeking post-discharge placement for this patient at the Skilled  Nursing Facility level of care (*CSW will initial, date and re-position this form in  chart as items are completed):  Yes   Patient/family provided with [facility] Clinical Social Work Department's list of facilities offering this level of care within the geographic area requested by the patient (or if unable, by the patient's family).  Yes   Patient/family informed of their freedom to choose among providers that offer the needed level of care, that participate in Medicare, Medicaid or managed care program needed by the patient, have an available bed and are willing to accept the patient.  Yes   Patient/family informed of [facility]'s ownership interest in [facility] and [facility], as well as of the fact that they are under no obligation to receive care at these facilities.  PASRR submitted to EDS on       PASRR number received on       Existing PASRR number confirmed on       FL2 transmitted to all facilities in geographic area requested by pt/family on       FL2 transmitted to all facilities within larger geographic area on       Patient informed that his/her managed care company has contracts with or will negotiate with certain facilities, including the following:        Yes   Patient/family informed of bed offers received.  Patient chooses bed at [facility]     Physician recommends and patient chooses bed at      Patient to be transferred to [facility] on 12/18/18.  Patient to be transferred to facility by [facility]     Patient family notified on 12/18/18 of transfer.  Name of family member notified:  Self     PHYSICIAN       Additional Comment:     _______________________________________________ Baldemar Lenis, LCSW 12/19/2018, 9:55 AM

## 2019-03-18 ENCOUNTER — Encounter (HOSPITAL_COMMUNITY): Payer: Self-pay

## 2019-03-18 ENCOUNTER — Other Ambulatory Visit: Payer: Self-pay

## 2019-03-18 ENCOUNTER — Emergency Department (HOSPITAL_COMMUNITY): Payer: BC Managed Care – PPO

## 2019-03-18 ENCOUNTER — Inpatient Hospital Stay (HOSPITAL_COMMUNITY)
Admission: EM | Admit: 2019-03-18 | Discharge: 2019-03-25 | DRG: 640 | Disposition: A | Discharge status: Skilled Nursing Facility | Payer: BC Managed Care – PPO | Attending: Internal Medicine | Admitting: Internal Medicine

## 2019-03-18 DIAGNOSIS — D649 Anemia, unspecified: Secondary | ICD-10-CM

## 2019-03-18 DIAGNOSIS — E876 Hypokalemia: Secondary | ICD-10-CM | POA: Diagnosis present

## 2019-03-18 DIAGNOSIS — R400 Somnolence: Secondary | ICD-10-CM | POA: Diagnosis not present

## 2019-03-18 DIAGNOSIS — E46 Unspecified protein-calorie malnutrition: Secondary | ICD-10-CM | POA: Diagnosis present

## 2019-03-18 DIAGNOSIS — R600 Localized edema: Secondary | ICD-10-CM

## 2019-03-18 DIAGNOSIS — Z7983 Long term (current) use of bisphosphonates: Secondary | ICD-10-CM

## 2019-03-18 DIAGNOSIS — T68XXXA Hypothermia, initial encounter: Secondary | ICD-10-CM | POA: Diagnosis present

## 2019-03-18 DIAGNOSIS — E162 Hypoglycemia, unspecified: Secondary | ICD-10-CM | POA: Diagnosis not present

## 2019-03-18 DIAGNOSIS — Z79890 Hormone replacement therapy (postmenopausal): Secondary | ICD-10-CM

## 2019-03-18 DIAGNOSIS — Z79899 Other long term (current) drug therapy: Secondary | ICD-10-CM

## 2019-03-18 DIAGNOSIS — F1010 Alcohol abuse, uncomplicated: Secondary | ICD-10-CM | POA: Diagnosis present

## 2019-03-18 DIAGNOSIS — G8324 Monoplegia of upper limb affecting left nondominant side: Secondary | ICD-10-CM | POA: Diagnosis present

## 2019-03-18 DIAGNOSIS — R569 Unspecified convulsions: Secondary | ICD-10-CM

## 2019-03-18 DIAGNOSIS — G40909 Epilepsy, unspecified, not intractable, without status epilepticus: Secondary | ICD-10-CM | POA: Diagnosis present

## 2019-03-18 DIAGNOSIS — J9811 Atelectasis: Secondary | ICD-10-CM | POA: Diagnosis present

## 2019-03-18 DIAGNOSIS — R680 Hypothermia, not associated with low environmental temperature: Secondary | ICD-10-CM | POA: Diagnosis present

## 2019-03-18 DIAGNOSIS — L8990 Pressure ulcer of unspecified site, unspecified stage: Secondary | ICD-10-CM

## 2019-03-18 DIAGNOSIS — I4510 Unspecified right bundle-branch block: Secondary | ICD-10-CM | POA: Diagnosis present

## 2019-03-18 DIAGNOSIS — Z7289 Other problems related to lifestyle: Secondary | ICD-10-CM

## 2019-03-18 DIAGNOSIS — E039 Hypothyroidism, unspecified: Secondary | ICD-10-CM | POA: Diagnosis present

## 2019-03-18 DIAGNOSIS — I10 Essential (primary) hypertension: Secondary | ICD-10-CM | POA: Diagnosis present

## 2019-03-18 DIAGNOSIS — K7030 Alcoholic cirrhosis of liver without ascites: Secondary | ICD-10-CM | POA: Diagnosis present

## 2019-03-18 DIAGNOSIS — D638 Anemia in other chronic diseases classified elsewhere: Secondary | ICD-10-CM | POA: Diagnosis present

## 2019-03-18 DIAGNOSIS — Z6827 Body mass index (BMI) 27.0-27.9, adult: Secondary | ICD-10-CM

## 2019-03-18 DIAGNOSIS — Z9114 Patient's other noncompliance with medication regimen: Secondary | ICD-10-CM

## 2019-03-18 DIAGNOSIS — Z20828 Contact with and (suspected) exposure to other viral communicable diseases: Secondary | ICD-10-CM | POA: Diagnosis present

## 2019-03-18 DIAGNOSIS — G40919 Epilepsy, unspecified, intractable, without status epilepticus: Secondary | ICD-10-CM | POA: Diagnosis present

## 2019-03-18 DIAGNOSIS — Z789 Other specified health status: Secondary | ICD-10-CM

## 2019-03-18 DIAGNOSIS — G9341 Metabolic encephalopathy: Secondary | ICD-10-CM | POA: Diagnosis present

## 2019-03-18 DIAGNOSIS — F1090 Alcohol use, unspecified, uncomplicated: Secondary | ICD-10-CM

## 2019-03-18 LAB — URINALYSIS, ROUTINE W REFLEX MICROSCOPIC
Bilirubin Urine: NEGATIVE
Glucose, UA: NEGATIVE mg/dL
Hgb urine dipstick: NEGATIVE
Ketones, ur: NEGATIVE mg/dL
Leukocytes,Ua: NEGATIVE
Nitrite: NEGATIVE
Protein, ur: NEGATIVE mg/dL
Specific Gravity, Urine: 1.008 (ref 1.005–1.030)
pH: 5 (ref 5.0–8.0)

## 2019-03-18 LAB — CBC WITH DIFFERENTIAL/PLATELET
Abs Immature Granulocytes: 0.01 10*3/uL (ref 0.00–0.07)
Basophils Absolute: 0 10*3/uL (ref 0.0–0.1)
Basophils Relative: 1 %
Eosinophils Absolute: 0.1 10*3/uL (ref 0.0–0.5)
Eosinophils Relative: 3 %
HCT: 29.8 % — ABNORMAL LOW (ref 39.0–52.0)
Hemoglobin: 9.3 g/dL — ABNORMAL LOW (ref 13.0–17.0)
Immature Granulocytes: 0 %
Lymphocytes Relative: 26 %
Lymphs Abs: 1 10*3/uL (ref 0.7–4.0)
MCH: 27.5 pg (ref 26.0–34.0)
MCHC: 31.2 g/dL (ref 30.0–36.0)
MCV: 88.2 fL (ref 80.0–100.0)
Monocytes Absolute: 0.3 10*3/uL (ref 0.1–1.0)
Monocytes Relative: 7 %
Neutro Abs: 2.5 10*3/uL (ref 1.7–7.7)
Neutrophils Relative %: 63 %
Platelets: 146 10*3/uL — ABNORMAL LOW (ref 150–400)
RBC: 3.38 MIL/uL — ABNORMAL LOW (ref 4.22–5.81)
RDW: 18.3 % — ABNORMAL HIGH (ref 11.5–15.5)
WBC: 3.9 10*3/uL — ABNORMAL LOW (ref 4.0–10.5)
nRBC: 0 % (ref 0.0–0.2)

## 2019-03-18 LAB — RAPID URINE DRUG SCREEN, HOSP PERFORMED
Amphetamines: NOT DETECTED
Barbiturates: NOT DETECTED
Benzodiazepines: NOT DETECTED
Cocaine: NOT DETECTED
Opiates: NOT DETECTED
Tetrahydrocannabinol: NOT DETECTED

## 2019-03-18 LAB — ETHANOL: Alcohol, Ethyl (B): <10 mg/dL (ref <10)

## 2019-03-18 LAB — LACTIC ACID, PLASMA: Lactic Acid, Venous: 0.7 mmol/L (ref 0.5–1.9)

## 2019-03-18 LAB — COMPREHENSIVE METABOLIC PANEL WITH GFR
ALT: 28 U/L (ref 0–44)
AST: 36 U/L (ref 15–41)
Albumin: 3 g/dL — ABNORMAL LOW (ref 3.5–5.0)
Alkaline Phosphatase: 80 U/L (ref 38–126)
Anion gap: 8 (ref 5–15)
BUN: 11 mg/dL (ref 8–23)
CO2: 24 mmol/L (ref 22–32)
Calcium: 8.7 mg/dL — ABNORMAL LOW (ref 8.9–10.3)
Chloride: 107 mmol/L (ref 98–111)
Creatinine, Ser: 0.75 mg/dL (ref 0.61–1.24)
GFR calc Af Amer: >60 mL/min
GFR calc non Af Amer: >60 mL/min
Glucose, Bld: 77 mg/dL (ref 70–99)
Potassium: 3.7 mmol/L (ref 3.5–5.1)
Sodium: 139 mmol/L (ref 135–145)
Total Bilirubin: 0.2 mg/dL — ABNORMAL LOW (ref 0.3–1.2)
Total Protein: 6.9 g/dL (ref 6.5–8.1)

## 2019-03-18 LAB — SARS CORONAVIRUS 2 BY RT PCR (HOSPITAL ORDER, PERFORMED IN ~~LOC~~ HOSPITAL LAB): SARS Coronavirus 2: NEGATIVE

## 2019-03-18 LAB — TROPONIN I: Troponin I: <0.03 ng/mL (ref <0.03)

## 2019-03-18 MED ORDER — LEVETIRACETAM 500 MG PO TABS: 1500.0000 mg | ORAL_TABLET | Freq: Two times a day (BID) | ORAL | Status: DC

## 2019-03-18 MED ORDER — ENOXAPARIN SODIUM 40 MG/0.4ML ~~LOC~~ SOLN
40.0000 mg | Freq: Every day | SUBCUTANEOUS | Status: DC
  Administered 2019-03-18 – 2019-03-24 (×7): 40 mg via SUBCUTANEOUS
  Filled 2019-03-18 (×7): qty 0.4

## 2019-03-18 MED ORDER — FOLIC ACID 5 MG/ML IJ SOLN
1.0000 mg | Freq: Every day | INTRAMUSCULAR | Status: DC
  Filled 2019-03-18: qty 0.2

## 2019-03-18 MED ORDER — THIAMINE HCL 100 MG/ML IJ SOLN
100.0000 mg | Freq: Once | INTRAMUSCULAR | Status: AC
  Administered 2019-03-18: 100 mg via INTRAVENOUS
  Filled 2019-03-18: qty 2

## 2019-03-18 MED ORDER — LORAZEPAM 2 MG/ML IJ SOLN: 1.0000 mg | INTRAMUSCULAR | Status: DC | PRN

## 2019-03-18 MED ORDER — ADULT MULTIVITAMIN W/MINERALS CH
1.0000 | ORAL_TABLET | Freq: Every day | ORAL | Status: DC
  Administered 2019-03-20 – 2019-03-25 (×6): 1 via ORAL
  Filled 2019-03-18 (×6): qty 1

## 2019-03-18 MED ORDER — THIAMINE HCL 100 MG/ML IJ SOLN: 100.0000 mg | Freq: Every day | INTRAMUSCULAR | Status: DC

## 2019-03-18 MED ORDER — SODIUM CHLORIDE 0.9 % IV SOLN: INTRAVENOUS | Status: DC

## 2019-03-18 MED ORDER — LEVETIRACETAM 500 MG PO TABS
1500.0000 mg | ORAL_TABLET | Freq: Two times a day (BID) | ORAL | Status: DC
  Administered 2019-03-19: 1500 mg via ORAL
  Filled 2019-03-18: qty 3

## 2019-03-18 MED ORDER — VANCOMYCIN HCL IN DEXTROSE 1-5 GM/200ML-% IV SOLN
1000.0000 mg | Freq: Once | INTRAVENOUS | Status: AC
  Administered 2019-03-19: 1000 mg via INTRAVENOUS
  Filled 2019-03-18: qty 200

## 2019-03-18 MED ORDER — LEVETIRACETAM IN NACL 1500 MG/100ML IV SOLN
1500.0000 mg | Freq: Once | INTRAVENOUS | Status: AC
  Administered 2019-03-18: 1500 mg via INTRAVENOUS
  Filled 2019-03-18: qty 100

## 2019-03-18 MED ORDER — POTASSIUM CHLORIDE 10 MEQ/100ML IV SOLN
10.0000 meq | INTRAVENOUS | Status: AC
  Administered 2019-03-18 – 2019-03-19 (×3): 10 meq via INTRAVENOUS
  Filled 2019-03-18 (×3): qty 100

## 2019-03-18 MED ORDER — PIPERACILLIN-TAZOBACTAM 3.375 G IVPB 30 MIN
3.3750 g | Freq: Once | INTRAVENOUS | Status: AC
  Administered 2019-03-19: 01:00:00 3.375 g via INTRAVENOUS
  Filled 2019-03-18: qty 50

## 2019-03-18 MED ORDER — SODIUM CHLORIDE 0.9 % IV BOLUS
500.0000 mL | Freq: Once | INTRAVENOUS | Status: AC
  Administered 2019-03-18: 20:00:00 500 mL via INTRAVENOUS

## 2019-03-18 MED ORDER — SODIUM CHLORIDE 0.9 % IV SOLN: 75.0000 mL/h | INTRAVENOUS | Status: DC

## 2019-03-18 MED ORDER — DEXTROSE-NACL 5-0.9 % IV SOLN
INTRAVENOUS | Status: DC
  Administered 2019-03-18 – 2019-03-19 (×2): via INTRAVENOUS

## 2019-03-18 NOTE — ED Notes (Signed)
Mason [phone]

## 2019-03-18 NOTE — ED Notes (Signed)
Pt transported to CT ?

## 2019-03-18 NOTE — H&P (Addendum)
History and Physical    Mason Hart:[id number] DOB: [birth year] DOA: 03/18/2019  PCP: [name], [name] Patient coming from: Home  Chief Complaint: Altered mental status, possible seizure  HPI: Mason Hart is a 63 y.o. male with medical history significant of seizures and medication noncompliance, previous admission in January 2020 for status epilepticus which required intubation and ICU admission presenting to the hospital via EMS for evaluation of altered mental status and possible seizure.  EMS spoke to patient's niece who stated that she found him on the floor when she arrived to give him his Keppra.  At first the patient was very somnolent but his mental status improved in route with EMS.  Blood glucose greater than 80.  Patient was postictal on arrival. ED provider spoke to the patient's niece by phone: "She states that she stopped by the patient's house twice per day to make sure he is taking his Keppra and can confirm that he has been taking it as prescribed. Saw him this AM and was looking well. She did see empty beer bottles around the house as if the patient and his roommate had been drinking. She found him "foaming at the mouth" when she arrived and he felt rigid. No complaints of cough, fever, chills, or other infection symptoms."  Patient is currently very somnolent and postictal.  History provided by him very limited.  He is not sure why he is here.  Oriented to self only.  He has no complaints.  Denies any pain, fevers, chills, cough, shortness of breath, nausea, vomiting, abdominal pain, diarrhea, dysuria, urinary frequency, or urgency.  States he drinks alcohol but not every day.  He is not able to give me any additional history.  Review of Systems: As per HPI otherwise 10 point review of systems negative.  Past Medical History:  Diagnosis Date   Noncompliance with medication regimen    Seizures (HCC)     History reviewed. No pertinent surgical history.   reports that he has never smoked. He has never used smokeless tobacco. He reports current alcohol use of about 7.0 standard drinks of alcohol per week. He reports that he does not use drugs.  No Known Allergies  Family History  Problem Relation Age of Onset   Heart disease Mother    Cancer Neg Hx    Diabetes Neg Hx    Stroke Neg Hx     Prior to Admission medications   Medication Sig Start Date End Date Taking? Authorizing Provider  acetaminophen (TYLENOL) 325 MG tablet Take 325 mg by mouth 2 (two) times daily as needed for mild pain.   Yes [provider]  feeding supplement, ENSURE ENLIVE, (ENSURE ENLIVE) LIQD Take 237 mLs by mouth 3 (three) times daily between meals. 12/17/18  Yes Rolly Salter, MD  folic acid (FOLVITE) 1 MG tablet Take 1 mg by mouth daily. 01/22/19  Yes [provider]  levETIRAcetam (KEPPRA) 1000 MG tablet Take 1 tablet (1,000 mg total) by mouth 2 (two) times daily. 12/17/18  Yes Rolly Salter, MD  metoprolol tartrate (LOPRESSOR) 25 MG tablet Take 1 tablet (25 mg total) by mouth 2 (two) times daily. 12/17/18  Yes Rolly Salter, MD  SANTYL ointment Apply 1 application topically at bedtime. Apply to sacrum topically every evening shift for wound. Apply santyl and calcium alginate to sacrum and cover with dry dressing. 01/29/19  Yes [provider]  tamsulosin (FLOMAX) 0.4 MG CAPS capsule Take 0.4 mg by mouth daily. 03/16/19  Yes [provider]  Thiamine HCl (VITAMIN B1) 100 MG TABS Take 100 mg by mouth daily. 01/22/19  Yes [provider]    Physical Exam: Vitals:   03/18/19 2030 03/18/19 2200 03/18/19 2221 03/18/19 2230  BP: 135/83 (!) 126/98  (!) 146/88  Pulse: (!) 59 (!) 55  (!) 58  Resp: 10 12  12   Temp:   (!) 91 F (32.8 C)   TempSrc:   Rectal   SpO2: 98% 98%  99%    Physical Exam  Constitutional: He is oriented to person, place, and time. He appears well-developed and well-nourished. No distress.  HENT:    Head: Normocephalic.  Mouth/Throat: Oropharynx is clear and moist.  Eyes: Right eye exhibits no discharge. Left eye exhibits no discharge.  Neck: Neck supple.  Cardiovascular: Normal rate, regular rhythm and intact distal pulses.  Pulmonary/Chest: Effort normal. He has no wheezes. He has no rales.  Anterior lung fields clear to auscultation  Abdominal: Soft. Bowel sounds are normal. He exhibits no distension. There is no abdominal tenderness. There is no rebound and no guarding.  Musculoskeletal:        General: Edema present.     Comments: +2 pedal edema bilaterally Left lower extremity appears erythematous and larger in size compared to the right lower extremity.  No erythema or increased warmth.  Nontender to palpation.  Neurological: He is alert and oriented to person, place, and time.  Skin: Skin is warm and dry. He is not diaphoretic.     Labs on Admission: I have personally reviewed following labs and imaging studies  CBC: Recent Labs  Lab 03/18/19 1932  WBC 3.9*  NEUTROABS 2.5  HGB 9.3*  HCT 29.8*  MCV 88.2  PLT 146*   Basic Metabolic Panel: Recent Labs  Lab 03/18/19 1932  NA 139  K 3.7  CL 107  CO2 24  GLUCOSE 77  BUN 11  CREATININE 0.75  CALCIUM 8.7*   GFR: CrCl cannot be calculated (Unknown ideal weight.). Liver Function Tests: Recent Labs  Lab 03/18/19 1932  AST 36  ALT 28  ALKPHOS 80  BILITOT 0.2*  PROT 6.9  ALBUMIN 3.0*   No results for input(s): LIPASE, AMYLASE in the last 168 hours. No results for input(s): AMMONIA in the last 168 hours. Coagulation Profile: No results for input(s): INR, PROTIME in the last 168 hours. Cardiac Enzymes: Recent Labs  Lab 03/18/19 1932  TROPONINI <0.03   BNP (last 3 results) No results for input(s): PROBNP in the last 8760 hours. HbA1C: No results for input(s): HGBA1C in the last 72 hours. CBG: No results for input(s): GLUCAP in the last 168 hours. Lipid Profile: No results for input(s): CHOL,  HDL, LDLCALC, TRIG, CHOLHDL, LDLDIRECT in the last 72 hours. Thyroid Function Tests: No results for input(s): TSH, T4TOTAL, FREET4, T3FREE, THYROIDAB in the last 72 hours. Anemia Panel: No results for input(s): VITAMINB12, FOLATE, FERRITIN, TIBC, IRON, RETICCTPCT in the last 72 hours. Urine analysis:    Component Value Date/Time   COLORURINE AMBER (A) 11/13/2018 2311   APPEARANCEUR TURBID (A) 11/13/2018 2311   LABSPEC 1.024 11/13/2018 2311   PHURINE 5.0 11/13/2018 2311   GLUCOSEU NEGATIVE 11/13/2018 2311   HGBUR MODERATE (A) 11/13/2018 2311   BILIRUBINUR SMALL (A) 11/13/2018 2311   KETONESUR 5 (A) 11/13/2018 2311   PROTEINUR 100 (A) 11/13/2018 2311   UROBILINOGEN 1.0 04/04/2012 1449   NITRITE NEGATIVE 11/13/2018 2311   LEUKOCYTESUR NEGATIVE 11/13/2018 2311    Radiological Exams  on Admission: Ct Head Wo Contrast  Result Date: 03/18/2019 CLINICAL DATA:  Found on floor EXAM: CT HEAD WITHOUT CONTRAST CT CERVICAL SPINE WITHOUT CONTRAST TECHNIQUE: Multidetector CT imaging of the head and cervical spine was performed following the standard protocol without intravenous contrast. Multiplanar CT image reconstructions of the cervical spine were also generated. COMPARISON:  CT head 12/04/2018 FINDINGS: CT HEAD FINDINGS Brain: Mild atrophy and mild chronic microvascular ischemic type changes in the white matter. Negative for acute infarct. Negative for hemorrhage or mass. Vascular: Negative for hyperdense vessel Skull: Negative for fracture Sinuses/Orbits: Chronic sinusitis with diffuse mucoperiosteal thickening, left greater than right. Negative orbit Other: None CT CERVICAL SPINE FINDINGS Alignment: Normal Skull base and vertebrae: Negative for fracture Soft tissues and spinal canal: Negative Disc levels: Multilevel disc and facet degeneration. Prominent anterior osteophytes in the cervical spine Upper chest: Negative Other: None IMPRESSION: 1. No acute intracranial abnormality. Mild chronic  microvascular ischemic change in the white matter 2. Negative for cervical spine fracture. Electronically Signed   By: Marlan Palauharles  Clark M.D.   On: 03/18/2019 19:37   Ct Cervical Spine Wo Contrast  Result Date: 03/18/2019 CLINICAL DATA:  Found on floor EXAM: CT HEAD WITHOUT CONTRAST CT CERVICAL SPINE WITHOUT CONTRAST TECHNIQUE: Multidetector CT imaging of the head and cervical spine was performed following the standard protocol without intravenous contrast. Multiplanar CT image reconstructions of the cervical spine were also generated. COMPARISON:  CT head 12/04/2018 FINDINGS: CT HEAD FINDINGS Brain: Mild atrophy and mild chronic microvascular ischemic type changes in the white matter. Negative for acute infarct. Negative for hemorrhage or mass. Vascular: Negative for hyperdense vessel Skull: Negative for fracture Sinuses/Orbits: Chronic sinusitis with diffuse mucoperiosteal thickening, left greater than right. Negative orbit Other: None CT CERVICAL SPINE FINDINGS Alignment: Normal Skull base and vertebrae: Negative for fracture Soft tissues and spinal canal: Negative Disc levels: Multilevel disc and facet degeneration. Prominent anterior osteophytes in the cervical spine Upper chest: Negative Other: None IMPRESSION: 1. No acute intracranial abnormality. Mild chronic microvascular ischemic change in the white matter 2. Negative for cervical spine fracture. Electronically Signed   By: Marlan Palauharles  Clark M.D.   On: 03/18/2019 19:37   Dg Chest Portable 1 View  Result Date: 03/18/2019 CLINICAL DATA:  Chest rhonchi EXAM: PORTABLE CHEST 1 VIEW COMPARISON:  12/07/2018 FINDINGS: Heart size mildly enlarged without heart failure. Mild bibasilar airspace disease. No effusion. IMPRESSION: Mild bibasilar atelectasis/infiltrate. Electronically Signed   By: Marlan Palauharles  Clark M.D.   On: 03/18/2019 19:30    EKG: Independently reviewed.  Sinus rhythm, PVCs, QTC 534.  Assessment/Plan Principal Problem:   Breakthrough seizure  (HCC) Active Problems:   Alcohol use   Hypothermia   Edema of left lower extremity   Chronic anemia   Breakthrough seizure  -Seizure activity based on history provided by patient's niece who also confirmed that he has been compliant with Keppra.  Currently on Keppra 1000 mg twice daily. Postictal when seen by EMS.  Patient hypothermic but no infectious etiology identified so far. Head CT negative for acute finding. Seizure activity could also possibly be due to alcohol withdrawal as patient and his niece reported alcohol use. -ED provider discussed the case with Dr. Otelia LimesLindzen from neurology who recommended giving maximum dose Keppra prior to starting a second AED. -IV Keppra 1500 mg given in the ED.  Continue IV Keppra 1500 mg twice daily starting in the morning. -Stepdown CIWA monitoring -Ativan PRN -IV fluid hydration -Check CK level -EEG -CBG monitoring every 2 hours -  Keep n.p.o. -Seizure precautions -Aspiration precautions -Frequent neurochecks -Pending labs: Blood ethanol level, UA, UDS  Hypothermia, unclear etiology -Temperature 91 F (patient was not outside when EMS found him).  No tachycardia, tachypnea, or hypotension to suggest sepsis.  No leukocytosis.  Chest x-ray showing mild basilar atelectasis/infiltrate.  No respiratory complaints or other infectious symptoms per history provided by patient and his niece.  SARS-CoV-2 test negative. Abdominal exam benign.  Blood glucose 77 on arrival. -Broad-spectrum antibiotics including vancomycin and Zosyn at this time to cover for possible infection -Bear hugger -UA pending -Check TSH, free T4 levels -Check cortisol level -UDS pending -Blood ethanol level pending -Check CK level -Check lactic acid level -Check procalcitonin level -Monitor CBG every 2 hours -IV fluid hydration with D5 normal saline infusion -Continue to monitor temperature and hemodynamics closely  Addendum: Labs consistent with hypothyroidism.  Give IV  Synthroid 25 mcg at this time. Transition to oral Synthroid 50 mcg daily when able to tolerate p.o. meds.  Will need repeat thyroid function tests in 6 weeks.  Left lower extremity edema Left lower extremity appears edematous and larger in size compared to the contralateral side. -Doppler to rule out DVT  Chronic normocytic anemia -Stable.  Hemoglobin 9.3, above recent baseline.  Alcohol use -Stepdown CIWA monitoring; Ativan PRN -Thiamine, folate, multivitamin  Prolonged QTC (534) -Avoid QT prolonging drugs -Keep K >4 and Mag >2  DVT prophylaxis: Lovenox Code Status: Full code Family Communication: No family available. Disposition Plan: Anticipate discharge after clinical improvement. Consults called: Neurology (Dr. Otelia Limes) Admission status: Observation, stepdown unit  This chart was dictated using voice recognition software.  Despite best efforts to proofread, errors can occur which can change the documentation meaning.  Terrel Giovanni MD [facility] Pager [phone]  If 7PM-7AM, please contact night-coverage [email or web address] Password [email or web address]  03/18/2019, 10:50 PM

## 2019-03-18 NOTE — ED Notes (Signed)
Pt has condom cath. Will collect urine sample once pt voids.

## 2019-03-18 NOTE — ED Notes (Signed)
Bairhugger initiated.

## 2019-03-18 NOTE — ED Notes (Signed)
Pt brief and linen changed and placed on condom catheter. Pt was able to roll and follow directions.

## 2019-03-18 NOTE — ED Triage Notes (Signed)
EMS reports from home, called for seizure, Hx of. Niece found Pt on floor, possible fall, no visible injuries. Denies pain. Pt takes Keppra.  BP 140//76 HR 64 RR 18 Sp02 94 RA CBG 89  18ga R forearm

## 2019-03-18 NOTE — ED Provider Notes (Signed)
Emergency Department Provider Note   I have reviewed the triage vital signs and the nursing notes.   HISTORY  Chief Complaint Seizures   HPI Mason Hart is a 63 y.o. male with PMH of seizure disorder presents to the emergency department by EMS with Assubel seizure activity at home.  According to EMS the patient appears to live alone.  I spoke with his niece, Mason Hart, states that she found him on the floor when she arrived to give him his Keppra.  At first the patient was very somnolent but his mental status has improved in route with EMS.  They obtained a blood sugar greater than 80 and have maintained on heart monitor in route.  Patient appears postictal which limits the history.  Level 5 caveat applies.    [name], Mason Hart by phone [phone] but did not get an answer.    Past Medical History:  Diagnosis Date   Noncompliance with medication regimen    Seizures [facility])     Patient Active Problem List   Diagnosis Date Noted   Breakthrough seizure (HCC) 03/18/2019   Edema of left lower extremity 03/18/2019   Chronic anemia 03/18/2019   Anemia, normocytic normochromic 12/05/2018   Endotracheally intubated 12/05/2018   Hypothermia 12/05/2018   Hyponatremia 12/05/2018   Postictal state (HCC) 12/05/2018   Sinusitis 12/05/2018   Abnormal CXR 12/05/2018   Hypotension 12/05/2018   Bradycardia 12/05/2018   ARF (acute renal failure) (HCC) 11/14/2018   Clavicle fracture 11/14/2018   Hypokalemia 06/24/2017   Hypoglycemia 06/24/2017   Alcohol use 06/24/2017   Seizure (HCC) 06/24/2017   Noncompliance with medication regimen    Seizures (HCC)     History reviewed. No pertinent surgical history.  Allergies Patient has no known allergies.  Family History  Problem Relation Age of Onset   Heart disease Mother    Cancer Neg Hx    Diabetes Neg Hx    Stroke Neg Hx     Social History Social History   Tobacco Use    Smoking status: Never Smoker   Smokeless tobacco: Never Used  Substance Use Topics   Alcohol use: Yes    Alcohol/week: 7.0 standard drinks    Types: 7 Cans of beer per week    Comment: occassionally   Drug use: No    Review of Systems  Level 5 caveat: Post-ictal  ____________________________________________   PHYSICAL EXAM:  VITAL SIGNS: Vitals:   03/19/19 0600 03/19/19 0834  BP:    Pulse: 88 (!) 102  Resp: 17 20  Temp: (!) 96.3 F (35.7 C)   SpO2: 96% 98%    Constitutional: Drowsy but eyes open and following commands. Answering some simple questions.  Eyes: Conjunctivae are normal. PERRL.  Head: Atraumatic. Nose: No congestion/rhinnorhea. Mouth/Throat: Mucous membranes are moist.  Oropharynx non-erythematous. Neck: No stridor. No cervical spine tenderness to palpation. Cardiovascular: Normal rate, regular rhythm. Good peripheral circulation. Grossly normal heart sounds.   Respiratory: Normal respiratory effort.  No retractions. Lungs with faint rhonchi on exam bilaterally.  Gastrointestinal: Soft and nontender. No distention.  Musculoskeletal: No lower extremity tenderness nor edema. No gross deformities of extremities. Neurologic: Somnolent but following commands. Equal strength bilaterally in the upper and lower extremities. No facial droop. Unable to assess sensory deficit with post-ictal state.  Skin:  Skin is warm, dry and intact. No rash noted.  ____________________________________________   LABS (all labs ordered are listed, but only abnormal results are displayed)  Labs Reviewed  COMPREHENSIVE METABOLIC PANEL - Abnormal; Notable for the following components:      Result Value   Calcium 8.7 (*)    Albumin 3.0 (*)    Total Bilirubin 0.2 (*)    All other components within normal limits  CBC WITH DIFFERENTIAL/PLATELET - Abnormal; Notable for the following components:   WBC 3.9 (*)    RBC 3.38 (*)    Hemoglobin 9.3 (*)    HCT 29.8 (*)    RDW 18.3 (*)     Platelets 146 (*)    All other components within normal limits  TSH - Abnormal; Notable for the following components:   TSH 5.010 (*)    All other components within normal limits  T4, FREE - Abnormal; Notable for the following components:   Free T4 0.77 (*)    All other components within normal limits  GLUCOSE, CAPILLARY - Abnormal; Notable for the following components:   Glucose-Capillary 108 (*)    All other components within normal limits  GLUCOSE, CAPILLARY - Abnormal; Notable for the following components:   Glucose-Capillary 63 (*)    All other components within normal limits  GLUCOSE, CAPILLARY - Abnormal; Notable for the following components:   Glucose-Capillary 48 (*)    All other components within normal limits  SARS CORONAVIRUS 2 (HOSPITAL ORDER, PERFORMED IN [facility])  MRSA PCR SCREENING  TROPONIN I  ETHANOL  URINALYSIS, ROUTINE W REFLEX MICROSCOPIC  RAPID URINE DRUG SCREEN, HOSP PERFORMED  CK  CORTISOL  LACTIC ACID, PLASMA  PROCALCITONIN  MAGNESIUM  GLUCOSE, CAPILLARY  GLUCOSE, CAPILLARY  GLUCOSE, CAPILLARY   ____________________________________________  EKG   EKG Interpretation  Date/Time:  Tuesday March 18 2019 19:02:50 EDT Ventricular Rate:  62 PR Interval:    QRS Duration: 175 QT Interval:  525 QTC Calculation: 534 R Axis:   146 Text Interpretation:  Sinus rhythm Ventricular premature complex Short PR interval Nonspecific intraventricular conduction delay No STEMI. Similar to prior tracing.  Confirmed by Alona Bene [phone]) on 03/18/2019 7:06:21 PM Also confirmed by Alona Bene [phone]), editor Barbette Hair [phone])  on 03/19/2019 6:56:14 AM       ____________________________________________  RADIOLOGY  Ct Head Wo Contrast  Result Date: 03/18/2019 CLINICAL DATA:  Found on floor EXAM: CT HEAD WITHOUT CONTRAST CT CERVICAL SPINE WITHOUT CONTRAST TECHNIQUE: Multidetector CT imaging of the head and cervical spine was performed  following the standard protocol without intravenous contrast. Multiplanar CT image reconstructions of the cervical spine were also generated. COMPARISON:  CT head 12/04/2018 FINDINGS: CT HEAD FINDINGS Brain: Mild atrophy and mild chronic microvascular ischemic type changes in the white matter. Negative for acute infarct. Negative for hemorrhage or mass. Vascular: Negative for hyperdense vessel Skull: Negative for fracture Sinuses/Orbits: Chronic sinusitis with diffuse mucoperiosteal thickening, left greater than right. Negative orbit Other: None CT CERVICAL SPINE FINDINGS Alignment: Normal Skull base and vertebrae: Negative for fracture Soft tissues and spinal canal: Negative Disc levels: Multilevel disc and facet degeneration. Prominent anterior osteophytes in the cervical spine Upper chest: Negative Other: None IMPRESSION: 1. No acute intracranial abnormality. Mild chronic microvascular ischemic change in the white matter 2. Negative for cervical spine fracture. Electronically Signed   By: Marlan Palau M.D.   On: 03/18/2019 19:37   Ct Cervical Spine Wo Contrast  Result Date: 03/18/2019 CLINICAL DATA:  Found on floor EXAM: CT HEAD WITHOUT CONTRAST CT CERVICAL SPINE WITHOUT CONTRAST TECHNIQUE: Multidetector CT imaging of the head and cervical spine was performed following the standard protocol  without intravenous contrast. Multiplanar CT image reconstructions of the cervical spine were also generated. COMPARISON:  CT head 12/04/2018 FINDINGS: CT HEAD FINDINGS Brain: Mild atrophy and mild chronic microvascular ischemic type changes in the white matter. Negative for acute infarct. Negative for hemorrhage or mass. Vascular: Negative for hyperdense vessel Skull: Negative for fracture Sinuses/Orbits: Chronic sinusitis with diffuse mucoperiosteal thickening, left greater than right. Negative orbit Other: None CT CERVICAL SPINE FINDINGS Alignment: Normal Skull base and vertebrae: Negative for fracture Soft tissues  and spinal canal: Negative Disc levels: Multilevel disc and facet degeneration. Prominent anterior osteophytes in the cervical spine Upper chest: Negative Other: None IMPRESSION: 1. No acute intracranial abnormality. Mild chronic microvascular ischemic change in the white matter 2. Negative for cervical spine fracture. Electronically Signed   By: Marlan Palau M.D.   On: 03/18/2019 19:37   Dg Chest Portable 1 View  Result Date: 03/18/2019 CLINICAL DATA:  Chest rhonchi EXAM: PORTABLE CHEST 1 VIEW COMPARISON:  12/07/2018 FINDINGS: Heart size mildly enlarged without heart failure. Mild bibasilar airspace disease. No effusion. IMPRESSION: Mild bibasilar atelectasis/infiltrate. Electronically Signed   By: Marlan Palau M.D.   On: 03/18/2019 19:30   Ct Head Code Stroke Wo Contrast  Result Date: 03/19/2019 CLINICAL DATA:  Code stroke.  Left-sided weakness and facial droop. EXAM: CT HEAD WITHOUT CONTRAST TECHNIQUE: Contiguous axial images were obtained from the base of the skull through the vertex without intravenous contrast. COMPARISON:  03/18/2019 FINDINGS: Brain: There is no evidence of acute infarct, intracranial hemorrhage, mass, midline shift, or extra-axial fluid collection. There is mild cerebral atrophy. Patchy cerebral white matter hypodensities are unchanged and nonspecific but compatible with mild chronic small vessel ischemic disease. Vascular: No hyperdense vessel. Skull: No acute fracture or focal osseous lesion. Sinuses/Orbits: Chronic sinusitis with persistent complete opacification of the left frontal and left maxillary sinuses and near complete left ethmoid air cell opacification. Milder mucosal thickening in the right frontal, right maxillary, and bilateral sphenoid sinuses. Clear mastoid air cells. Unremarkable orbits. Other: None. ASPECTS [facility] Stroke Program Early CT Score) - Ganglionic level infarction (caudate, lentiform nuclei, internal capsule, insula, M1-M3 cortex): 7 -  Supraganglionic infarction (M4-M6 cortex): 3 Total score (0-10 with 10 being normal): 10 IMPRESSION: 1. No evidence of acute intracranial abnormality. 2. ASPECTS is 10. 3. Mild chronic small vessel ischemic disease. These results were called by telephone at the time of interpretation on 03/19/2019 at 9:05 am to Dr. Erin Hearing , who verbally acknowledged these results. Electronically Signed   By: Sebastian Ache M.D.   On: 03/19/2019 09:07    ____________________________________________   PROCEDURES  Procedure(s) performed:   Procedures  None  ____________________________________________   INITIAL IMPRESSION / ASSESSMENT AND PLAN / ED COURSE  Pertinent labs & imaging results that were available during my care of the patient were reviewed by me and considered in my medical decision making (see chart for details).   Patient presents to the emergency department for evaluation of possible seizure activity with postictal state.  Mental status improving according to EMS from the time that they arrived on scene until the time the patient arrived in the emergency department.  He is able to give of occasional one-word answers.  He is following commands.  He is maintaining his airway.  During his last admission he was found to be in status and required intubation.  Patient does not require airway support at this time.  [name] who found him but was unsuccessful.  We will continue to try to reach family by phone for additional history.  Patient has history of medication noncompliance and at time of discharge in January was on 1000 mg of Keppra twice daily. No evidence of CVA. No fever to suspect infectious etiology. Plan for more extensive w/u at this time including CT head, c-spine, CXR, and labs since the report is that the patient was found confused on the floor at home and seizure was only presumed as the cause by family.   08:28 PM  Patient's mental status continues to  improve but does have some mild confusion.  I was able to reach his niece by phone.  She states that she stopped by the patient's house twice per day to make sure he is taking his Keppra and can confirm that he has been taking it as prescribed. Saw him this AM and was looking well. She did see empty beer bottles around the house as if the patient and his roommate had been drinking. She found him "foaming at the mouth" when she arrived and he felt rigid. No complaints of cough, fever, chills, or other infection symptoms. CT imaging and CXR reviewed. Bibasilar atelectasis. COVID testing sent but low suspicion for this diagnosis clinically.   09:15 PM  COVID testing negative. Mental status better still but remains somewhat somnolent. He can tell me that he is in the hospital but cannot tell me the year.  Spoke with Dr. Otelia LimesLindzen with Neurology. Plan to max out Keppra prior to starting a second AED. Missed evening dose today so will give 1500 mg Keppra IV here and ordered 1500 mg Keppra PO BID starting tomorrow. Will observe overnight. Updated Mason Hart by phone regarding plan for obs and med dose change.  ____________________________________________  FINAL CLINICAL IMPRESSION(S) / ED DIAGNOSES  Final diagnoses:  Seizure (HCC)  Somnolence     MEDICATIONS GIVEN DURING THIS VISIT:  Medications  levETIRAcetam (KEPPRA) tablet 1,500 mg (1,500 mg Oral Given 03/19/19 0616)  enoxaparin (LOVENOX) injection 40 mg (40 mg Subcutaneous Given 03/18/19 2339)  LORazepam (ATIVAN) injection 1-2 mg (has no administration in time range)  folic acid injection 1 mg (has no administration in time range)  multivitamin with minerals tablet 1 tablet (has no administration in time range)  dextrose 5 %-0.9 % sodium chloride infusion ( Intravenous New Bag/Given 03/19/19 0636)  chlorhexidine (PERIDEX) 0.12 % solution 15 mL (15 mLs Mouth Rinse Given 03/19/19 0833)  MEDLINE mouth rinse (has no administration in time range)    Chlorhexidine Gluconate Cloth 2 % PADS 6 each (has no administration in time range)  aspirin suppository 300 mg (has no administration in time range)  levothyroxine (SYNTHROID) tablet 25 mcg (has no administration in time range)  thiamine (VITAMIN B-1) tablet 100 mg (has no administration in time range)  gadobutrol (GADAVIST) 1 MMOL/ML injection 10 mL (has no administration in time range)  sodium chloride 0.9 % bolus 500 mL (0 mLs Intravenous Stopped 03/18/19 2128)  thiamine (B-1) injection 100 mg (100 mg Intravenous Given 03/18/19 2110)  levETIRAcetam (KEPPRA) IVPB 1500 mg/ 100 mL premix (1,500 mg Intravenous New Bag/Given 03/18/19 2138)  potassium chloride 10 mEq in 100 mL IVPB (0 mEq Intravenous Stopped 03/19/19 0256)  vancomycin (VANCOCIN) IVPB 1000 mg/200 mL premix (0 mg Intravenous Stopped 03/19/19 0146)  piperacillin-tazobactam (ZOSYN) IVPB 3.375 g (0 g Intravenous Stopped 03/19/19 0113)  dextrose 50 % solution 12.5 g (12.5 g Intravenous Given 03/19/19 0507)  levothyroxine (SYNTHROID, LEVOTHROID) injection 25 mcg (25 mcg Intravenous  Given 03/19/19 0614)  dextrose 50 % solution (  Duplicate 03/19/19 0820)  dextrose 50 % solution 50 mL (50 mLs Intravenous Given 03/19/19 0818)     Note:  This document was prepared using Dragon voice recognition software and may include unintentional dictation errors.  Alona Bene, MD Emergency Medicine   Erminie Foulks, Arlyss Repress, MD 03/19/19 [phone]

## 2019-03-18 NOTE — ED Notes (Signed)
Bed: RX54 Expected date:  Expected time:  Means of arrival:  Comments: EMS 62yo seizure, post ictal

## 2019-03-19 ENCOUNTER — Observation Stay (HOSPITAL_COMMUNITY): Payer: BC Managed Care – PPO

## 2019-03-19 ENCOUNTER — Inpatient Hospital Stay (HOSPITAL_COMMUNITY)
Admit: 2019-03-19 | Discharge: 2019-03-19 | Disposition: A | Discharge status: Home / Self Care | Payer: BC Managed Care – PPO | Attending: Internal Medicine | Admitting: Internal Medicine

## 2019-03-19 DIAGNOSIS — E876 Hypokalemia: Secondary | ICD-10-CM | POA: Diagnosis present

## 2019-03-19 DIAGNOSIS — J9811 Atelectasis: Secondary | ICD-10-CM | POA: Diagnosis present

## 2019-03-19 DIAGNOSIS — G9341 Metabolic encephalopathy: Secondary | ICD-10-CM | POA: Diagnosis present

## 2019-03-19 DIAGNOSIS — E039 Hypothyroidism, unspecified: Secondary | ICD-10-CM | POA: Diagnosis present

## 2019-03-19 DIAGNOSIS — E46 Unspecified protein-calorie malnutrition: Secondary | ICD-10-CM | POA: Diagnosis present

## 2019-03-19 DIAGNOSIS — L8990 Pressure ulcer of unspecified site, unspecified stage: Secondary | ICD-10-CM

## 2019-03-19 DIAGNOSIS — D649 Anemia, unspecified: Secondary | ICD-10-CM | POA: Diagnosis not present

## 2019-03-19 DIAGNOSIS — T68XXXA Hypothermia, initial encounter: Secondary | ICD-10-CM | POA: Diagnosis not present

## 2019-03-19 DIAGNOSIS — Z7289 Other problems related to lifestyle: Secondary | ICD-10-CM | POA: Diagnosis not present

## 2019-03-19 DIAGNOSIS — G40909 Epilepsy, unspecified, not intractable, without status epilepticus: Secondary | ICD-10-CM | POA: Diagnosis present

## 2019-03-19 DIAGNOSIS — I10 Essential (primary) hypertension: Secondary | ICD-10-CM | POA: Diagnosis present

## 2019-03-19 DIAGNOSIS — R400 Somnolence: Secondary | ICD-10-CM | POA: Diagnosis present

## 2019-03-19 DIAGNOSIS — G40919 Epilepsy, unspecified, intractable, without status epilepticus: Secondary | ICD-10-CM

## 2019-03-19 DIAGNOSIS — M7989 Other specified soft tissue disorders: Secondary | ICD-10-CM | POA: Diagnosis not present

## 2019-03-19 DIAGNOSIS — I4510 Unspecified right bundle-branch block: Secondary | ICD-10-CM | POA: Diagnosis present

## 2019-03-19 DIAGNOSIS — R600 Localized edema: Secondary | ICD-10-CM

## 2019-03-19 DIAGNOSIS — Z9114 Patient's other noncompliance with medication regimen: Secondary | ICD-10-CM | POA: Diagnosis not present

## 2019-03-19 DIAGNOSIS — E162 Hypoglycemia, unspecified: Principal | ICD-10-CM

## 2019-03-19 DIAGNOSIS — Z79899 Other long term (current) drug therapy: Secondary | ICD-10-CM | POA: Diagnosis not present

## 2019-03-19 DIAGNOSIS — Z20828 Contact with and (suspected) exposure to other viral communicable diseases: Secondary | ICD-10-CM | POA: Diagnosis present

## 2019-03-19 DIAGNOSIS — G8324 Monoplegia of upper limb affecting left nondominant side: Secondary | ICD-10-CM | POA: Diagnosis present

## 2019-03-19 DIAGNOSIS — Z79890 Hormone replacement therapy (postmenopausal): Secondary | ICD-10-CM | POA: Diagnosis not present

## 2019-03-19 DIAGNOSIS — Z6827 Body mass index (BMI) 27.0-27.9, adult: Secondary | ICD-10-CM | POA: Diagnosis not present

## 2019-03-19 DIAGNOSIS — K7030 Alcoholic cirrhosis of liver without ascites: Secondary | ICD-10-CM | POA: Diagnosis present

## 2019-03-19 DIAGNOSIS — R680 Hypothermia, not associated with low environmental temperature: Secondary | ICD-10-CM | POA: Diagnosis present

## 2019-03-19 DIAGNOSIS — F1010 Alcohol abuse, uncomplicated: Secondary | ICD-10-CM | POA: Diagnosis present

## 2019-03-19 DIAGNOSIS — Z7983 Long term (current) use of bisphosphonates: Secondary | ICD-10-CM | POA: Diagnosis not present

## 2019-03-19 DIAGNOSIS — D638 Anemia in other chronic diseases classified elsewhere: Secondary | ICD-10-CM | POA: Diagnosis present

## 2019-03-19 LAB — GLUCOSE, CAPILLARY
Glucose-Capillary: 75 mg/dL (ref 70–99)
Glucose-Capillary: 108 mg/dL — ABNORMAL HIGH (ref 70–99)
Glucose-Capillary: 63 mg/dL — ABNORMAL LOW (ref 70–99)
Glucose-Capillary: 90 mg/dL (ref 70–99)
Glucose-Capillary: 48 mg/dL — ABNORMAL LOW (ref 70–99)
Glucose-Capillary: 89 mg/dL (ref 70–99)
Glucose-Capillary: 49 mg/dL — ABNORMAL LOW (ref 70–99)
Glucose-Capillary: 89 mg/dL (ref 70–99)
Glucose-Capillary: 53 mg/dL — ABNORMAL LOW (ref 70–99)
Glucose-Capillary: 77 mg/dL (ref 70–99)
Glucose-Capillary: 47 mg/dL — ABNORMAL LOW (ref 70–99)
Glucose-Capillary: 107 mg/dL — ABNORMAL HIGH (ref 70–99)
Glucose-Capillary: 56 mg/dL — ABNORMAL LOW (ref 70–99)
Glucose-Capillary: 106 mg/dL — ABNORMAL HIGH (ref 70–99)
Glucose-Capillary: 117 mg/dL — ABNORMAL HIGH (ref 70–99)
Glucose-Capillary: 68 mg/dL — ABNORMAL LOW (ref 70–99)
Glucose-Capillary: 75 mg/dL (ref 70–99)
Glucose-Capillary: 83 mg/dL (ref 70–99)
Glucose-Capillary: 79 mg/dL (ref 70–99)

## 2019-03-19 LAB — PROCALCITONIN: Procalcitonin: <0.1 ng/mL

## 2019-03-19 LAB — CORTISOL: Cortisol, Plasma: 6.9 ug/dL

## 2019-03-19 LAB — T4, FREE: Free T4: 0.77 ng/dL — ABNORMAL LOW (ref 0.82–1.77)

## 2019-03-19 LAB — MRSA PCR SCREENING: MRSA by PCR: NEGATIVE

## 2019-03-19 LAB — MAGNESIUM: Magnesium: 1.9 mg/dL (ref 1.7–2.4)

## 2019-03-19 LAB — TSH: TSH: 5.01 u[IU]/mL — ABNORMAL HIGH (ref 0.350–4.500)

## 2019-03-19 LAB — CK: Total CK: 154 U/L (ref 49–397)

## 2019-03-19 MED ORDER — LEVOTHYROXINE SODIUM 25 MCG PO TABS
25.0000 ug | ORAL_TABLET | Freq: Every day | ORAL | Status: DC
  Administered 2019-03-21 – 2019-03-25 (×5): 25 ug via ORAL
  Filled 2019-03-19 (×5): qty 1

## 2019-03-19 MED ORDER — GADOBUTROL 1 MMOL/ML IV SOLN
10.0000 mL | Freq: Once | INTRAVENOUS | Status: AC | PRN
  Administered 2019-03-19: 10:00:00 10 mL via INTRAVENOUS

## 2019-03-19 MED ORDER — ASPIRIN 300 MG RE SUPP
300.0000 mg | Freq: Once | RECTAL | Status: AC
  Administered 2019-03-19: 11:00:00 300 mg via RECTAL
  Filled 2019-03-19: qty 1

## 2019-03-19 MED ORDER — DEXTROSE 50 % IV SOLN
INTRAVENOUS | Status: AC
  Administered 2019-03-19: 18:00:00 50 mL via INTRAVENOUS
  Filled 2019-03-19: qty 50

## 2019-03-19 MED ORDER — DEXTROSE 50 % IV SOLN
1.0000 | Freq: Once | INTRAVENOUS | Status: AC
  Administered 2019-03-19: 18:00:00 50 mL via INTRAVENOUS

## 2019-03-19 MED ORDER — DEXTROSE 50 % IV SOLN
INTRAVENOUS | Status: AC
  Administered 2019-03-19: 16:00:00 via INTRAVENOUS
  Filled 2019-03-19: qty 50

## 2019-03-19 MED ORDER — CHLORHEXIDINE GLUCONATE CLOTH 2 % EX PADS
6.0000 | MEDICATED_PAD | Freq: Every day | CUTANEOUS | Status: DC
  Administered 2019-03-20: 18:00:00 6 via TOPICAL

## 2019-03-19 MED ORDER — DEXTROSE 50 % IV SOLN
INTRAVENOUS | Status: AC
  Filled 2019-03-19: qty 50

## 2019-03-19 MED ORDER — VANCOMYCIN HCL 10 G IV SOLR
1500.0000 mg | Freq: Two times a day (BID) | INTRAVENOUS | Status: DC
  Filled 2019-03-19: qty 1500

## 2019-03-19 MED ORDER — DEXTROSE 50 % IV SOLN
INTRAVENOUS | Status: AC
  Administered 2019-03-19: 12:00:00 via INTRAVENOUS
  Filled 2019-03-19: qty 50

## 2019-03-19 MED ORDER — ASPIRIN EC 81 MG PO TBEC
81.0000 mg | DELAYED_RELEASE_TABLET | Freq: Every day | ORAL | Status: DC
  Administered 2019-03-20 – 2019-03-25 (×6): 81 mg via ORAL
  Filled 2019-03-19 (×6): qty 1

## 2019-03-19 MED ORDER — FOLIC ACID 1 MG PO TABS
1.0000 mg | ORAL_TABLET | Freq: Every day | ORAL | Status: DC
  Administered 2019-03-20 – 2019-03-25 (×6): 1 mg via ORAL
  Filled 2019-03-19 (×6): qty 1

## 2019-03-19 MED ORDER — DEXTROSE 50 % IV SOLN
1.0000 | Freq: Once | INTRAVENOUS | Status: AC
  Administered 2019-03-19: 11:00:00 50 mL via INTRAVENOUS

## 2019-03-19 MED ORDER — DEXTROSE 50 % IV SOLN
INTRAVENOUS | Status: AC
  Administered 2019-03-19: 11:00:00 50 mL via INTRAVENOUS
  Filled 2019-03-19: qty 50

## 2019-03-19 MED ORDER — DEXTROSE 50 % IV SOLN
50.0000 mL | Freq: Once | INTRAVENOUS | Status: AC
  Administered 2019-03-19: 08:00:00 50 mL via INTRAVENOUS

## 2019-03-19 MED ORDER — DEXTROSE 10 % IV SOLN
INTRAVENOUS | Status: DC
  Administered 2019-03-19 (×2): via INTRAVENOUS
  Filled 2019-03-19 (×2): qty 1000

## 2019-03-19 MED ORDER — LEVETIRACETAM IN NACL 1500 MG/100ML IV SOLN
1500.0000 mg | Freq: Two times a day (BID) | INTRAVENOUS | Status: DC
  Administered 2019-03-19 – 2019-03-21 (×4): 1500 mg via INTRAVENOUS
  Filled 2019-03-19 (×6): qty 100

## 2019-03-19 MED ORDER — DEXTROSE 50 % IV SOLN
12.5000 g | INTRAVENOUS | Status: AC
  Administered 2019-03-19: 05:00:00 12.5 g via INTRAVENOUS
  Filled 2019-03-19: qty 50

## 2019-03-19 MED ORDER — DEXTROSE 50 % IV SOLN
1.0000 | Freq: Once | INTRAVENOUS | Status: AC
  Administered 2019-03-19: 12:00:00 via INTRAVENOUS
  Filled 2019-03-19: qty 50

## 2019-03-19 MED ORDER — TAMSULOSIN HCL 0.4 MG PO CAPS
0.4000 mg | ORAL_CAPSULE | Freq: Every day | ORAL | Status: DC
  Administered 2019-03-20 – 2019-03-25 (×6): 0.4 mg via ORAL
  Filled 2019-03-19 (×6): qty 1

## 2019-03-19 MED ORDER — DEXTROSE 50 % IV SOLN
1.0000 | Freq: Once | INTRAVENOUS | Status: AC
  Administered 2019-03-19: 16:00:00 via INTRAVENOUS

## 2019-03-19 MED ORDER — ORAL CARE MOUTH RINSE
15.0000 mL | Freq: Two times a day (BID) | OROMUCOSAL | Status: DC
  Administered 2019-03-19 – 2019-03-24 (×10): 15 mL via OROMUCOSAL

## 2019-03-19 MED ORDER — METOPROLOL TARTRATE 25 MG PO TABS
25.0000 mg | ORAL_TABLET | Freq: Two times a day (BID) | ORAL | Status: DC
  Administered 2019-03-20 – 2019-03-21 (×3): 25 mg via ORAL
  Filled 2019-03-19 (×3): qty 1

## 2019-03-19 MED ORDER — LEVOTHYROXINE SODIUM 100 MCG/5ML IV SOLN
25.0000 ug | Freq: Once | INTRAVENOUS | Status: AC
  Administered 2019-03-19: 25 ug via INTRAVENOUS
  Filled 2019-03-19: qty 5

## 2019-03-19 MED ORDER — PIPERACILLIN-TAZOBACTAM 3.375 G IVPB
3.3750 g | Freq: Three times a day (TID) | INTRAVENOUS | Status: DC
  Administered 2019-03-19: 07:00:00 3.375 g via INTRAVENOUS
  Filled 2019-03-19: qty 50

## 2019-03-19 MED ORDER — CHLORHEXIDINE GLUCONATE 0.12 % MT SOLN
15.0000 mL | Freq: Two times a day (BID) | OROMUCOSAL | Status: DC
  Administered 2019-03-19 – 2019-03-25 (×14): 15 mL via OROMUCOSAL
  Filled 2019-03-19 (×11): qty 15

## 2019-03-19 MED ORDER — VITAMIN B-1 100 MG PO TABS
100.0000 mg | ORAL_TABLET | Freq: Every day | ORAL | Status: DC
  Administered 2019-03-20 – 2019-03-25 (×6): 100 mg via ORAL
  Filled 2019-03-19 (×6): qty 1

## 2019-03-19 MED ORDER — DEXTROSE 50 % IV SOLN
1.0000 | Freq: Once | INTRAVENOUS | Status: AC
  Administered 2019-03-19: 14:00:00 50 mL via INTRAVENOUS

## 2019-03-19 NOTE — Progress Notes (Signed)
CRITICAL VALUE ALERT  Critical Value:  CBG 63  Date & Time Notied:  03/19/19;0450  Provider Notified: Yes  Orders Received/Actions taken: awaiting orders

## 2019-03-19 NOTE — Progress Notes (Signed)
EEG Completed; Results Pending  

## 2019-03-19 NOTE — Procedures (Signed)
ELECTROENCEPHALOGRAM REPORT   Patient: Mason Hart       Room #: 1236 EEG No. ID: [id number] Age: 63 y.o.        Sex: male Referring Physician: Arrien Report Date:  03/19/2019        Interpreting Physician: Thana Farr  History: Edsil Irigoyen is an 63 y.o. male with a history of seizures, found down  Medications:  ASA, Folvite, Synthroid, Lopressor, Flomax, Keppra, MVI, Thiamine  Conditions of Recording:  This is a 21 channel routine scalp EEG performed with bipolar and monopolar montages arranged in accordance to the international 10/20 system of electrode placement. One channel was dedicated to EKG recording.  The patient is in the awake and drowsy states.  Description:  Artifact is prominent during the recording often obscuring the background rhythm. When able to be visualized the waking background activity consists of a low voltage, symmetrical, fairly well organized, 7 Hz theta activity at its maximum, seen from the parieto-occipital and posterior temporal regions.  This activity is often noted to be slower in the slow theta and delta range though.  Low voltage fast activity, poorly organized, is seen anteriorly and is at times superimposed on more posterior regions.  A mixture of theta and alpha rhythms are seen from the central and temporal regions.  With drowse this background activity becomes even slower.   Stage II sleep is not obtained. No epileptiform activity is noted.   Hyperventilation and intermittent photic stimulation were not performed.  IMPRESSION: This EEG is characterized by slowing which is consistent with a general cerebral disturbance such as a metabolic encephalopathy or postictal state but can not rule out the possibility of normal drowse.  Clinical correlation recommended.  No epileptiform activity is noted.       Thana Farr, MD Neurology [phone] 03/19/2019, 3:23 PM

## 2019-03-19 NOTE — Progress Notes (Signed)
Md paged and updated on consistent hypoglycemia despite Dextrose 10% IV at 50 ml/hr and Dextrose 50 x 3 doses this shift. Pt with periods of lethargy, but wakes to voice easily. No acute change to LOC.  Dextrose 10% rate increased to 80 ml/hr per Dr. Ella Jubilee. CBGs q 1 hr at this time. Will monitor.

## 2019-03-19 NOTE — Progress Notes (Signed)
Pharmacy Antibiotic Note  Mason Hart is a 63 y.o. male admitted on 03/18/2019 with sepsis.  Pharmacy has been consulted for Vancomycin and Zosyn dosing.  Plan: Vancomycin 1gm iv x1, then Vancomycin 1500 mg IV Q 12 hrs. Goal AUC 400-550. Expected AUC: 483 SCr used: 0.8 (adjusted)  Zosyn 3.375gm iv x1, then Zosyn 3.375g IV Q8H infused over 4hrs.   Height: 6\' 1"  (185.4 cm) Weight: 206 lb 12.7 oz (93.8 kg) IBW/kg (Calculated) : 79.9  Temp (24hrs), Avg:91.9 F (33.3 C), Min:90 F (32.2 C), Max:95.2 F (35.1 C)  Recent Labs  Lab 03/18/19 1932 03/18/19 2328  WBC 3.9*  --   CREATININE 0.75  --   LATICACIDVEN  --  0.7    Estimated Creatinine Clearance: 105.4 mL/min (by C-G formula based on SCr of 0.75 mg/dL).    No Known Allergies  Antimicrobials this admission: Vancomycin 03/19/2019 >> Zosyn 03/19/2019 >>   Dose adjustments this admission: -  Microbiology results: -  Thank you for allowing pharmacy to be a part of this patient's care.  Mason Hart 03/19/2019 5:31 AM

## 2019-03-19 NOTE — Consult Note (Signed)
[facility] Tele[facility]rology Consult Services   Date of Service:   03/19/2019 08:45:55  Impression:     .  Rule Out Acute Ischemic Stroke     .  Rule out unwitnessed seizure with post ictal confusion and seizure in setting of hypoglycemia  Comments/Sign-Out: 63 yo M with long history of recurrent seizure now with left sided weakness, generalzied weakness and decreased level of alertness this morning along with hypoglycemia.   Probable stroke mimic (ie. hypoglycemia, post ictal from seizure) though stroke cannot be excluded No tPA currently gven mild and improving symptoms with alternate explanation; hypoglycemia, prophylactic enoxaparin and seizure at onset are only relative contraindications to tPA and did not affect decision making. Will proceed with aspirin treatment only  Mechanism of Stroke: Not Clear  Metrics: Last Known Well: 03/19/2019 06:00:00 [facility] Notification Time: 03/19/2019 08:45:55 Stamp Time: 03/19/2019 08:45:55 Time First Login Attempt: 03/19/2019 08:48:45 Video Start Time: 03/19/2019 08:48:45  Symptoms: left sided weakness, decreased responsiveness possible post ictal NIHSS Start Assessment Time: 03/19/2019 08:55:29 Patient is not a candidate for tPA. Patient was not deemed candidate for tPA thrombolytics because of Hypoglycemic at onset; possible unwitnessed seizure. Video End Time: 03/19/2019 09:07:46  CT head showed no acute hemorrhage or acute core infarct.  Clinical Presentation is not Suggestive of Large Vessel Occlusive Disease  Our recommendations are outlined below.  Recommendations:     .  Activate Stroke Protocol Admission/Order Set     .  Stroke/Telemetry Floor     .  Neuro Checks     .  Bedside Swallow Eval     .  DVT Prophylaxis     .  IV Fluids, Normal Saline     .  Head of Bed Below 30 Degrees     .  Euglycemia and Avoid Hyperthermia (PRN Acetaminophen)       .  Antiplatelet Therapy Recommended     .   recommend MRI brain w wo contrast to definitively rule out stroke     .  Rectal ASA 300 mg now     .  EEG pending    Sign Out:     .  Discussed with Primary Attending     .  Discussed with Rapid Response Team    ------------------------------------------------------------------------------  History of Present Illness: Patient is a 63 year old Male.  Inpatient stroke alert was called for symptoms of left sided weakness, decreased responsiveness possible post ictal  63 yo M with longstanding history of seizures on Keppra, alcohol withdrawal and medication noncompliance who was admitted last night with seizures. Daughter found him down on floor yesterday. He came in hypothermic and sepsis alert activated, empiric antibiotics over concern for an infecction. This morning he was last known well at 6a. At 815, his left side of his face was drooping, unable to lift up his left arm Blood sugar 48. Currently decreased level of alertness, has left facial asymmetry and left arm drifts more than the right.  CT head showed no acute hemorrhage or acute core infarct.  Last seen normal was within 4.5 hours. There is no history of hemorrhagic complications or intracranial hemorrhage. There is no history of Recent Anticoagulants. There is no history of recent major surgery. There is no history of recent stroke.  Examination: BP(124/80), Blood Glucose(48) 1A: Level of Consciousness - Arouses to minor stimulation + 1 1B: Ask Month and Age - Both Questions Right + 0 1C: Blink Eyes & Squeeze Hands - Performs Both Tasks + 0  2: Test Horizontal Extraocular Movements - Normal + 0 3: Test Visual Fields - No Visual Loss + 0 4: Test Facial Palsy (Use Grimace if Obtunded) - Minor paralysis (flat nasolabial fold, smile asymmetry) + 1 5A: Test Left Arm Motor Drift - Drift, but doesn't hit bed + 1 5B: Test Right Arm Motor Drift - Drift, but doesn't hit bed + 1 6A: Test Left Leg Motor Drift - No Drift for 5  Seconds + 0 6B: Test Right Leg Motor Drift - No Drift for 5 Seconds + 0 7: Test Limb Ataxia (FNF/Heel-Shin) - No Ataxia + 0 8: Test Sensation - Normal; No sensory loss + 0 9: Test Language/Aphasia - Normal; No aphasia + 0 10: Test Dysarthria - Mild-Moderate Dysarthria: Slurring but can be understood + 1 11: Test Extinction/Inattention - No abnormality + 0  NIHSS Score: 5  Patient was informed the Neurology Consult would happen via TeleHealth consult by way of interactive audio and video telecommunications and consented to receiving care in this manner.  Due to the immediate potential for life-threatening deterioration due to underlying acute neurologic illness, I spent 35 minutes providing critical care. This time includes time for face to face visit via telemedicine, review of medical records, imaging studies and discussion of findings with providers, the patient and/or family.   Dr Schuyler AmorSam Carilyn Woolston   [facility] [phone]  Case [id number]

## 2019-03-19 NOTE — Progress Notes (Signed)
Pt back from MRI Awaiting results. Dr. Ella Jubilee paged and  updated on Pt status. SLP swallow screen ordered for Patient. Pt has wet cough at times, and is unable to clear secretions. RN swallow Screen not done due to risk for aspiration. Pt NPO at this time, awaiting SLP swallow eval.

## 2019-03-19 NOTE — Evaluation (Addendum)
SLP Cancellation Note  Patient Details Name: Mason Hart MRN: [id number] DOB: [birth year]   Cancelled treatment:       Reason Eval/Treat Not Completed: Other (comment)(pt having EEG done at this time, will continue efforts)   RN reports pt with wet gurgly voice and note CXR concerning for infiltrate/ATX.  Pt will benefit from MBS prior to po administration given h/o dysphagia and current neurologic concerns.  Rec NPO x tsps water and single ice chips.  RN informed and MD messaged.   Donavan Burnet, MS [facility] SLP Acute Rehab Services Pager [phone] Office [phone]   [name] 03/19/2019, 2:13 PM

## 2019-03-19 NOTE — Progress Notes (Signed)
Left lower extremity venous duplex completed. Refer to "CV Proc" under chart review to view preliminary results.  03/19/2019 4:13 PM Gertie Fey, MHA, RVT, RDCS, RDMS

## 2019-03-19 NOTE — Progress Notes (Signed)
PROGRESS NOTE    Jamelle Jeanette  PET:[id number] DOB: 1956/04/10 DOA: 03/18/2019 PCP: [name], [name]    Brief Narrative:  63 year old male who presented with altered mental status and possible seizure.  He does have significant past medical history of seizures, medical noncompliance.  Apparently he was found on the floor by his family, he was very rigid and foaming from his mouth.  On his initial physical examination he was very somnolent, he was oriented only to himself.  Pressure was 135/83, heart rate 59, respiratory rate 12, temperature 32.8 C, oxygen saturation 98%, his lungs were clear to auscultation, heart S1-S2 present and rhythmic, abdomen was soft, his left lower extremity was erythematous and larger than the right side.  Sodium 139, potassium 3.7, chloride 107, bicarb 24, glucose 77, BUN 11, creatinine 1.75, white count 3.9, hemoglobin 9.3, hematocrit 29.8, platelets 146.  His urine analysis was negative for infection.  Alcohol level less than 10, toxicology screen negative.  His head and cervical CT were negative. Chest x ray had bibasilar atelectasis.  His EKG had 62 bpm, normal axis, sinus rhythm, right bundle branch block, positive PVCs, he is manually corrected QTC is 420.   Patient was admitted to the hospital with working diagnosis of uncontrolled seizures.  Assessment & Plan:   Principal Problem:   Breakthrough seizure (HCC) Active Problems:   Alcohol use   Hypothermia   Edema of left lower extremity   Chronic anemia   1. Uncontrolled seizures. Patient this am is more awake and alert, noted to have left upper extremity weakness and left facial droop, unclear if new. I was not able to contact his family. Will repeat head CT as part of code stroke protocol. Will continue keppra IV and follow with neurology recommendations. Continue neuro checks.   2. Hypoglycemia. Admission glucose 77, this am down to 48, despite IV dextrose infusion. Suspected poor oral intake,  not on hypoglycemic agents at home. Possible low glycogen storages. Will continue IV dextrose and capillary glucose monitoring. Will check liver US as part of the workup. Possible alcohol abuse at home.   3. Hypothermia. This am temp 35.7 C, on warming measures. Will continue close monitoring. No signs of deep infection, will dc antibiotic therapy for now.   4. Hypothyroid. Will continue levothyroxine.    DVT prophylaxis: enoxaparin   Code Status:  full Family Communication: no family at the bedside  Disposition Plan/ discharge barriers: pending clinical improvement.  Body mass index is 27.28 kg/m. Malnutrition Type:      Malnutrition Characteristics:      Nutrition Interventions:     RN Pressure Injury Documentation:    Consultants:   Neurology   Procedures:     Antimicrobials:       Subjective: Patient is more awake this am, following commands, no nausea or vomiting, no chest pain. Has left upper extremity weakness and left facial droop.   Objective: Vitals:   03/19/19 0347 03/19/19 0400 03/19/19 0500 03/19/19 0600  BP:  (!) 136/94 (!) 155/102   Pulse:  81 88 88  Resp:  11 13 17   Temp: (!) 95.2 F (35.1 C)   (!) 96.3 F (35.7 C)  TempSrc: Rectal   Rectal  SpO2:  98% 100% 96%  Weight:      Height:        Intake/Output Summary (Last 24 hours) at 03/19/2019 0813 Last data filed at 03/19/2019 0530 Gross per 24 hour  Intake 736.7 ml  Output 1150 ml  Net -413.3 ml   Filed Weights   03/18/19 2310  Weight: 93.8 kg    Examination:   General: deconditioned  Neurology: Awake and alert, positive left upper extremity weakness, proximal 1/5, distal 4/5. Right upper extremity 4/5, lower extremities 2/5.  E ENT: mild pallor, no icterus, oral mucosa moist Cardiovascular: No JVD. S1-S2 present, rhythmic, no gallops, rubs, or murmurs. No lower extremity edema. Pulmonary: positive breath sounds bilaterally, adequate air movement, no wheezing, rhonchi or  rales. Gastrointestinal. Abdomen with no organomegaly, non tender, no rebound or guarding Skin. No rashes Musculoskeletal: no joint deformities     Data Reviewed: I have personally reviewed following labs and imaging studies  CBC: Recent Labs  Lab 03/18/19 1932  WBC 3.9*  NEUTROABS 2.5  HGB 9.3*  HCT 29.8*  MCV 88.2  PLT 146*   Basic Metabolic Panel: Recent Labs  Lab 03/18/19 1932 03/18/19 2328  NA 139  --   K 3.7  --   CL 107  --   CO2 24  --   GLUCOSE 77  --   BUN 11  --   CREATININE 0.75  --   CALCIUM 8.7*  --   MG  --  1.9   GFR: Estimated Creatinine Clearance: 105.4 mL/min (by C-G formula based on SCr of 0.75 mg/dL). Liver Function Tests: Recent Labs  Lab 03/18/19 1932  AST 36  ALT 28  ALKPHOS 80  BILITOT 0.2*  PROT 6.9  ALBUMIN 3.0*   No results for input(s): LIPASE, AMYLASE in the last 168 hours. No results for input(s): AMMONIA in the last 168 hours. Coagulation Profile: No results for input(s): INR, PROTIME in the last 168 hours. Cardiac Enzymes: Recent Labs  Lab 03/18/19 1932 03/18/19 2328  CKTOTAL  --  154  TROPONINI <0.03  --    BNP (last 3 results) No results for input(s): PROBNP in the last 8760 hours. HbA1C: No results for input(s): HGBA1C in the last 72 hours. CBG: Recent Labs  Lab 03/18/19 2327 03/19/19 0208 03/19/19 0443 03/19/19 0533  GLUCAP 75 108* 63* 90   Lipid Profile: No results for input(s): CHOL, HDL, LDLCALC, TRIG, CHOLHDL, LDLDIRECT in the last 72 hours. Thyroid Function Tests: Recent Labs    03/18/19 2328  TSH 5.010*  FREET4 0.77*   Anemia Panel: No results for input(s): VITAMINB12, FOLATE, FERRITIN, TIBC, IRON, RETICCTPCT in the last 72 hours.    Radiology Studies: I have reviewed all of the imaging during this hospital visit personally     Scheduled Meds: . chlorhexidine  15 mL Mouth Rinse BID  . [START ON 03/20/2019] Chlorhexidine Gluconate Cloth  6 each Topical Daily  . enoxaparin  (LOVENOX) injection  40 mg Subcutaneous QHS  . folic acid  1 mg Intravenous Daily  . levETIRAcetam  1,500 mg Oral BID  . mouth rinse  15 mL Mouth Rinse q12n4p  . multivitamin with minerals  1 tablet Oral Daily  . thiamine injection  100 mg Intravenous Daily   Continuous Infusions: . dextrose 5 % and 0.9% NaCl 125 mL/hr at 03/19/19 0636  . piperacillin-tazobactam (ZOSYN)  IV 3.375 g (03/19/19 [id number])  . vancomycin       LOS: 0 days        Mauricio Annett Gulaaniel Arrien, MD

## 2019-03-19 NOTE — Progress Notes (Signed)
Pt found at 0815 with new facial droop and left sided weakness. CBG 48. Night Shift Dominga Ferry also at bedside, unable to say if left side droop and weakness is new.  Dr. Ella Jubilee paged [phone] and at bedside to assess.  Pt family member contacted per Dr. Ella Jubilee at bedside to confirm if left side weakness was baseline. No answer per MD, message left.  Dextrose 50 1 Amp given rechecked post yielding 89. Tele Stroke activated at [phone]. Pt taken to CT scan and Pt assessed per Tele Stroke MD and RN. Ct negative for acute changes. Per Dr. Schuyler Amor   No TPA at this time due to mild s/s and hypoglycemia. Rectal aspirin ordered and MRI ordered STAT. Pt taken to MRI.

## 2019-03-20 ENCOUNTER — Inpatient Hospital Stay (HOSPITAL_COMMUNITY): Payer: BC Managed Care – PPO

## 2019-03-20 LAB — CBC WITH DIFFERENTIAL/PLATELET
Basophils Absolute: 0 K/uL (ref 0.0–0.1)
Basophils Relative: 0 %
Eosinophils Absolute: 0 K/uL (ref 0.0–0.5)
Eosinophils Relative: 1 %
HCT: 26.4 % — ABNORMAL LOW (ref 39.0–52.0)
Hemoglobin: 7.9 g/dL — ABNORMAL LOW (ref 13.0–17.0)
Lymphocytes Relative: 20 %
Lymphs Abs: 1.1 K/uL (ref 0.7–4.0)
MCH: 27.4 pg (ref 26.0–34.0)
MCHC: 29.9 g/dL — ABNORMAL LOW (ref 30.0–36.0)
MCV: 91.7 fL (ref 80.0–100.0)
Monocytes Absolute: 0.4 K/uL (ref 0.1–1.0)
Monocytes Relative: 8 %
Neutro Abs: 3.8 K/uL (ref 1.7–7.7)
Neutrophils Relative %: 71 %
Platelets: 108 K/uL — ABNORMAL LOW (ref 150–400)
RBC: 2.88 MIL/uL — ABNORMAL LOW (ref 4.22–5.81)
RDW: 19.3 % — ABNORMAL HIGH (ref 11.5–15.5)
WBC: 5.6 K/uL (ref 4.0–10.5)
nRBC: 0 % (ref 0.0–0.2)
nRBC: 0 /100{WBCs}

## 2019-03-20 LAB — GLUCOSE, CAPILLARY
Glucose-Capillary: 102 mg/dL — ABNORMAL HIGH (ref 70–99)
Glucose-Capillary: 115 mg/dL — ABNORMAL HIGH (ref 70–99)
Glucose-Capillary: 78 mg/dL (ref 70–99)
Glucose-Capillary: 81 mg/dL (ref 70–99)
Glucose-Capillary: 82 mg/dL (ref 70–99)
Glucose-Capillary: 83 mg/dL (ref 70–99)
Glucose-Capillary: 84 mg/dL (ref 70–99)
Glucose-Capillary: 86 mg/dL (ref 70–99)
Glucose-Capillary: 86 mg/dL (ref 70–99)
Glucose-Capillary: 87 mg/dL (ref 70–99)
Glucose-Capillary: 88 mg/dL (ref 70–99)
Glucose-Capillary: 91 mg/dL (ref 70–99)
Glucose-Capillary: 99 mg/dL (ref 70–99)

## 2019-03-20 LAB — BASIC METABOLIC PANEL
Anion gap: 4 — ABNORMAL LOW (ref 5–15)
BUN: 8 mg/dL (ref 8–23)
CO2: 27 mmol/L (ref 22–32)
Calcium: 8.3 mg/dL — ABNORMAL LOW (ref 8.9–10.3)
Chloride: 104 mmol/L (ref 98–111)
Creatinine, Ser: 0.91 mg/dL (ref 0.61–1.24)
GFR calc Af Amer: >60 mL/min (ref >60)
GFR calc non Af Amer: >60 mL/min (ref >60)
Glucose, Bld: 479 mg/dL — ABNORMAL HIGH (ref 70–99)
Potassium: 3.3 mmol/L — ABNORMAL LOW (ref 3.5–5.1)
Sodium: 135 mmol/L (ref 135–145)

## 2019-03-20 MED ORDER — KCL IN DEXTROSE-NACL 40-5-0.45 MEQ/L-%-% IV SOLN
INTRAVENOUS | Status: DC
  Administered 2019-03-20 – 2019-03-21 (×3): via INTRAVENOUS
  Filled 2019-03-20 (×4): qty 1000

## 2019-03-20 MED ORDER — BOOST / RESOURCE BREEZE PO LIQD CUSTOM
1.0000 | Freq: Three times a day (TID) | ORAL | Status: DC
  Administered 2019-03-20 – 2019-03-24 (×15): 1 via ORAL

## 2019-03-20 MED ORDER — ENSURE ENLIVE PO LIQD: 237.0000 mL | Freq: Two times a day (BID) | ORAL | Status: DC

## 2019-03-20 NOTE — Progress Notes (Signed)
  Speech Language Pathology Treatment: Dysphagia  Patient Details Name: Mason Hart MRN: [id number] DOB: 1956/03/18 Today's Date: 03/20/2019 Time: 2585-2778 SLP Time Calculation (min) (ACUTE ONLY): 15 min  Assessment / Plan / Recommendation Clinical Impression  SLP treatment session to educate pt to findings of MBS and reinforce effective compensations strategies.  Pt admits to increased problems with secretions during this hospital admission. Advised him to oral and pharyngeal delay and some weakness. He benefited from max cues to swallow twice intermittently and cough strongly if reflexive cough/throat clearing occurs for airway clearance.  Close monitoring of laryngeal elevation indicated to assure pt swallows due to his excessive delay.  SLP assisted RN in medication administration and reviewed aspiration precautions with her and pt.  CREAMY purees and thin please.  Will follow up for pt tolerance, education and compensation strategy usage/generalization if possible.  Using teach back, pt verbalize clinical reasoning for precautions.    HPI HPI: 63 yo male adm to [facility] after being found down on floor, pt found to have seizures and was postical.  PMH + for dysphagia, ETOH use, anemia, hypoglycemia, abnormal CXR.  Cervical spine negative, CT and MRI head negative.  CXR 03/20/2019 showed mild bibasilar ATX, infiltrate.  WBC 3.9 and pt is afebrile.  Pt had prior MBS in January 2020 with recommendation for puree/thin.        SLP Plan  Continue with current plan of care       Recommendations  Diet recommendations: Dysphagia 1 (puree);Thin liquid Liquids provided via: Cup;Straw Medication Administration: Whole meds with puree Supervision: Full supervision/cueing for compensatory strategies;Staff to assist with self feeding Compensations: Slow rate;Small sips/bites;Follow solids with liquid;Multiple dry swallows after each bite/sip(cough strongly if reflexive cough occuring) Postural Changes  and/or Swallow Maneuvers: Seated upright 90 degrees;Upright 30-60 min after meal                Oral Care Recommendations: Oral care before and after PO(oral suction before and after, pt with increased secretion retention) Follow up Recommendations: Skilled Nursing facility SLP Visit Diagnosis: Dysphagia, oropharyngeal phase (R13.12) Plan: Continue with current plan of care       GO                [name] 03/20/2019, 1:41 PM

## 2019-03-20 NOTE — Progress Notes (Addendum)
Modified Barium Swallow Progress Note  Patient Details  Name: Mason Hart MRN: [id number] Date of Birth: 01-20-1956  Today's Date: 03/20/2019  Modified Barium Swallow completed.  Full report located under Chart Review in the Imaging Section.  Brief recommendations include the following:  Clinical Impression  Pt continues to present with moderate oropharyngeal dysphagia. Various postures including chin tuck, neck extension and head turn left did not improve clearance nor airway protecton.  Oral phase with decreased oral control due to weakess with premature spillage of boluses into pharynx/larynx.  Also laryngeal penetration of thin and nectar apparent due to decreased epiglottic deflection and timing of laryngeal closure.  Mild vallecular residuals present without pt awareness and mix with copious secretions.  Would recommend to consider a dys1 or full liquid/thin diet with strict precautions including dry swallows after solids, sitting fully upright and medications with puree - start and follow with liquids.  Prefer water with meals due to residuals.  Will follow up with pt, RN and MD.    Tenna Child Evaluation Recommendations       SLP Diet Recommendations: Dysphagia 1 (Puree) solids or full liquids;Thin liquid; WATER with meals   Liquid Administration via: Cup;Straw   Medication Administration: Whole meds with puree   Supervision: Patient able to self feed;Full supervision/cueing for compensatory strategies   Compensations: Slow rate;Small sips/bites;Follow solids with liquid;Multiple dry swallows after each bite/sip   Postural Changes: Remain semi-upright after after feeds/meals (Comment);Seated upright at 90 degrees   Oral Care Recommendations: Oral care BID      Donavan Burnet, MS [facility] SLP Acute Rehab Services Pager [phone] Office [phone]   [name] 03/20/2019,10:34 AM

## 2019-03-20 NOTE — Progress Notes (Signed)
PROGRESS NOTE    Coralie CarpenJohn Lybrand  ZOX:[id number] DOB: Jan 27, 1956 DOA: 03/18/2019 PCP: [name], [name], [name]    Brief Narrative:  63 year old male who presented with altered mental status and possible seizure.  He does have significant past medical history of seizures, medical noncompliance.  Apparently he was found on the floor by his family, he was very rigid and foaming from his mouth.  On his initial physical examination he was very somnolent, he was oriented only to himself.  Pressure was 135/83, heart rate 59, respiratory rate 12, temperature 32.8 C, oxygen saturation 98%, his lungs were clear to auscultation, heart S1-S2 present and rhythmic, abdomen was soft, his left lower extremity was erythematous and larger than the right side.  Sodium 139, potassium 3.7, chloride 107, bicarb 24, glucose 77, BUN 11, creatinine 1.75, white count 3.9, hemoglobin 9.3, hematocrit 29.8, platelets 146.  His urine analysis was negative for infection.  Alcohol level less than 10, toxicology screen negative.  His head and cervical CT were negative. Chest x ray had bibasilar atelectasis.  His EKG had 62 bpm, normal axis, sinus rhythm, right bundle branch block, positive PVCs, he is manually corrected QTC is 420.   Patient was admitted to the hospital with working diagnosis of uncontrolled seizures   Assessment & Plan:   Principal Problem:   Breakthrough seizure (HCC) Active Problems:   Alcohol use   Hypothermia   Edema of left lower extremity   Chronic anemia   Pressure injury of skin  1. Uncontrolled seizures. Continue with keppra IV. Brain MRI with no acute CVA, suspected chronic left upper extremity weakness. Improved mentation. Will have physical therapy evaluation and out of bed to chair tid with meals. As needed lorazepam. Suspected alcohol abuse.   2. Hypoglycemia. Serum glucose this am at 479 fasting, capillary glucose 91, 81, 102, 78, 84. Will continue dextrose infusion with D5 for now. Will  allow patient to eat with aspiration precautions. Liver US with changes suggestive cirrhosis. Follow on c peptide. Continue capillary glucose monitoring q 2 H for now.   3. Hypothermia. Improved temperature to 97,7 and 98.7. with physical measures. No signs of systemic infection. Will continue conservative management and temperature monitoring. Continue to hold on antibiotic therapy. Procalcitonin less than 0.10.   4. Hypothyroid. Continue po levothyroxine.   5. Anemia. Suspected chronic and multifactorial. Hgb this am at 7,9 with Hct at 26, no signs of active bleeding, will check iron panel in am.   6. Hypokalemia. K at 3,3 will add Kcl to IV fluids and will follow on electrolytes in am, preserved renal function with serum cr at 0.91.   7. HTN. Continue blood pressure control with metoprolol.   DVT prophylaxis: enoxaparin   Code Status:  full Family Communication: no family at the bedside  Disposition Plan/ discharge barriers: pending clinical improvement.  Body mass index is 27.28 kg/m. Malnutrition Type:      Malnutrition Characteristics:      Nutrition Interventions:     RN Pressure Injury Documentation:    Consultants:     Procedures:     Antimicrobials:       Subjective: Patient is awake and alert this am, continue on dextrose infusion, no nausea or vomiting, no chest pain or dyspnea. Mentions chronic left upper extremity weakness.   Objective: Vitals:   03/20/19 0200 03/20/19 0400 03/20/19 0418 03/20/19 0600  BP: 121/62 (!) 141/97  130/86  Pulse: 69 65  63  Resp: 20 19  19   Temp:   Marland Kitchen(!)  97.5 F (36.4 C)   TempSrc:   Oral   SpO2: 95% 100%  99%  Weight:      Height:        Intake/Output Summary (Last 24 hours) at 03/20/2019 0749 Last data filed at 03/20/2019 0600 Gross per 24 hour  Intake 2008.06 ml  Output 1100 ml  Net 908.06 ml   Filed Weights   03/18/19 2310  Weight: 93.8 kg    Examination:   General: deconditioned   Neurology: Awake and alert, continue to have left upper extremity weakness, 3/5 proximal, normal hand grip bilaterally, right upper and lower extremities with preserved strength.  E ENT: mild pallor, no icterus, oral mucosa moist Cardiovascular: No JVD. S1-S2 present, rhythmic, no gallops, rubs, or murmurs. No lower extremity edema. Pulmonary: positive breath sounds bilaterally, adequate air movement, no wheezing, rhonchi or rales. Gastrointestinal. Abdomen with no organomegaly, non tender, no rebound or guarding Skin. No rashes Musculoskeletal: no joint deformities     Data Reviewed: I have personally reviewed following labs and imaging studies  CBC: Recent Labs  Lab 03/18/19 1932 03/20/19 0321  WBC 3.9* 5.6  NEUTROABS 2.5 3.8  HGB 9.3* 7.9*  HCT 29.8* 26.4*  MCV 88.2 91.7  PLT 146* 108*   Basic Metabolic Panel: Recent Labs  Lab 03/18/19 1932 03/18/19 2328 03/20/19 0321  NA 139  --  135  K 3.7  --  3.3*  CL 107  --  104  CO2 24  --  27  GLUCOSE 77  --  479*  BUN 11  --  8  CREATININE 0.75  --  0.91  CALCIUM 8.7*  --  8.3*  MG  --  1.9  --    GFR: Estimated Creatinine Clearance: 92.7 mL/min (by C-G formula based on SCr of 0.91 mg/dL). Liver Function Tests: Recent Labs  Lab 03/18/19 1932  AST 36  ALT 28  ALKPHOS 80  BILITOT 0.2*  PROT 6.9  ALBUMIN 3.0*   No results for input(s): LIPASE, AMYLASE in the last 168 hours. No results for input(s): AMMONIA in the last 168 hours. Coagulation Profile: No results for input(s): INR, PROTIME in the last 168 hours. Cardiac Enzymes: Recent Labs  Lab 03/18/19 1932 03/18/19 2328  CKTOTAL  --  154  TROPONINI <0.03  --    BNP (last 3 results) No results for input(s): PROBNP in the last 8760 hours. HbA1C: No results for input(s): HGBA1C in the last 72 hours. CBG: Recent Labs  Lab 03/19/19 2207 03/20/19 0002 03/20/19 0217 03/20/19 0414 03/20/19 0614  GLUCAP 79 99 88 87 86   Lipid Profile: No results for  input(s): CHOL, HDL, LDLCALC, TRIG, CHOLHDL, LDLDIRECT in the last 72 hours. Thyroid Function Tests: Recent Labs    03/18/19 2328  TSH 5.010*  FREET4 0.77*   Anemia Panel: No results for input(s): VITAMINB12, FOLATE, FERRITIN, TIBC, IRON, RETICCTPCT in the last 72 hours.    Radiology Studies: I have reviewed all of the imaging during this hospital visit personally     Scheduled Meds: . aspirin EC  81 mg Oral Daily  . chlorhexidine  15 mL Mouth Rinse BID  . Chlorhexidine Gluconate Cloth  6 each Topical Daily  . enoxaparin (LOVENOX) injection  40 mg Subcutaneous QHS  . folic acid  1 mg Oral Daily  . levothyroxine  25 mcg Oral Q0600  . mouth rinse  15 mL Mouth Rinse q12n4p  . metoprolol tartrate  25 mg Oral BID  . multivitamin with  minerals  1 tablet Oral Daily  . tamsulosin  0.4 mg Oral Daily  . thiamine  100 mg Oral Daily   Continuous Infusions: . dextrose 5 % and 0.45 % NaCl with KCl 40 mEq/L    . levETIRAcetam Stopped (03/19/19 2047)     LOS: 1 day        Mauricio Annett Gula, MD

## 2019-03-20 NOTE — Progress Notes (Signed)
Initial Nutrition Assessment  RD working remotely.   DOCUMENTATION CODES:   (unable to assess for malnutrition at this time)  INTERVENTION:  - will order Ensure Enlive BID, each supplement provides 350 kcal and 20 grams of protein. - continue to encourage PO intakes.    NUTRITION DIAGNOSIS:   Inadequate oral intake related to inability to eat as evidenced by NPO status.  GOAL:   Patient will meet greater than or equal to 90% of their needs  MONITOR:   PO intake, Supplement acceptance, Diet advancement, Labs, Weight trends, Skin  REASON FOR ASSESSMENT:   Malnutrition Screening Tool  ASSESSMENT:   63 y.o. male with medical history significant of seizures and medication noncompliance. He was admitted in 11/2018 for status epilepticus which required intubation and ICU admission. He presented to the ED at this time via EMS for evaluation of AMS and possible seizure. EMS spoke to patient's niece who stated she found patient on the floor when she arrived to give him his Keppra. Niece reported patient drinks alcohol but not every day. Patient was postictal on arrival to the ED.  BMI indicates overweight status. Diet was advanced from NPO to Dysphagia 1, thin liquids today at 10:45 AM. This was after MBS done by SLP. Note from SLP indicates recommendation for water only with meals.   Per chart review, current weight is 207 lb and weight on 1/22 was 233 lb; although that weight is an outlier compared to recorded weights over the past 3 years. Weight on 11/14/18 was 196 lb and weight on 10/14/18 was 211 lb. This indicates 4 lb weight loss (2% body weight) in the past 5 months; not significant for time frame.     Medications reviewed; I mg oral folvite/day, 25 mcg oral synthroid/day, daily multivitamin with minerals, 100 mg oral thiamine/day.  Labs reviewed; CBGs: 83-99 mg/dl since midnight. IVF; D5-1/2 NS-40 mEq IV KCl @ 75 ml/hr (306 kcal)     NUTRITION - FOCUSED PHYSICAL  EXAM:  unable to complete at this time  Diet Order:   Diet Order            DIET - DYS 1 Room service appropriate? Yes; Fluid consistency: Thin  Diet effective now              EDUCATION NEEDS:   Not appropriate for education at this time  Skin:  Skin Assessment: Skin Integrity Issues: Skin Integrity Issues:: Stage II Stage II: sacrum  Last BM:  PTA/unknown  Height:   Ht Readings from Last 1 Encounters:  03/18/19 6\' 1"  (1.854 m)    Weight:   Wt Readings from Last 1 Encounters:  03/18/19 93.8 kg    Ideal Body Weight:  83.64 kg  BMI:  Body mass index is 27.28 kg/m.  Estimated Nutritional Needs:   Kcal:  1115-5208 kcal  Protein:  100-110 grams  Fluid:  >/= 2 L/day     Trenton Gammon, MS, RD, LDN, [facility] Inpatient Clinical Dietitian Pager # [phone] After hours/weekend pager # [phone]

## 2019-03-21 LAB — BASIC METABOLIC PANEL
Anion gap: 6 (ref 5–15)
BUN: 10 mg/dL (ref 8–23)
CO2: 25 mmol/L (ref 22–32)
Calcium: 8.7 mg/dL — ABNORMAL LOW (ref 8.9–10.3)
Chloride: 108 mmol/L (ref 98–111)
Creatinine, Ser: 0.83 mg/dL (ref 0.61–1.24)
GFR calc Af Amer: >60 mL/min (ref >60)
GFR calc non Af Amer: >60 mL/min (ref >60)
Glucose, Bld: 77 mg/dL (ref 70–99)
Potassium: 3.9 mmol/L (ref 3.5–5.1)
Sodium: 139 mmol/L (ref 135–145)

## 2019-03-21 LAB — GLUCOSE, CAPILLARY
Glucose-Capillary: 70 mg/dL (ref 70–99)
Glucose-Capillary: 115 mg/dL — ABNORMAL HIGH (ref 70–99)
Glucose-Capillary: 102 mg/dL — ABNORMAL HIGH (ref 70–99)
Glucose-Capillary: 78 mg/dL (ref 70–99)

## 2019-03-21 LAB — C-PEPTIDE: C-Peptide: 1 ng/mL — ABNORMAL LOW (ref 1.1–4.4)

## 2019-03-21 MED ORDER — LEVETIRACETAM 500 MG PO TABS
1500.0000 mg | ORAL_TABLET | Freq: Two times a day (BID) | ORAL | Status: DC
  Administered 2019-03-21 – 2019-03-24 (×6): 1500 mg via ORAL
  Filled 2019-03-21 (×7): qty 3

## 2019-03-21 NOTE — Evaluation (Signed)
Physical Therapy Evaluation Patient Details Name: Mason Hart MRN: [id number] DOB: 03/06/1956 Today's Date: 03/21/2019   History of Present Illness  63 year old male who presented with altered mental status and uncontrolled seizure.  Past medical history of seizures, medical noncompliance, hypoglycemia, hypokalemia, L clavicle fx  Clinical Impression  Pt admitted with above diagnosis. Pt currently with functional limitations due to the deficits listed below (see PT Problem List). Pt will benefit from skilled PT to increase their independence and safety with mobility to allow discharge to the venue listed below.  Pt requiring total assist to sit EOB today so deferred attempt at standing as pt too weak at this time.  Pt with hx of residual right sided hemiparesis, however reports he is able to ambulate with SPC at baseline.  Recommend SNF upon d/c at this time.      Follow Up Recommendations SNF    Equipment Recommendations  None recommended by PT    Recommendations for Other Services OT consult     Precautions / Restrictions Precautions Precautions: Fall Precaution Comments: R hemiparesis      Mobility  Bed Mobility Overal bed mobility: Needs Assistance Bed Mobility: Supine to Sit;Sit to Supine     Supine to sit: Total assist;+2 for physical assistance Sit to supine: Total assist;+2 for physical assistance   General bed mobility comments: pt attempted to assist however too weak, tends to lean to left side in supine and sitting  RN and tech into room end of session to change linen and pericare.  Requested R UE elevation and wash cloth for right hand (pt with long nails and resting hand/finger flexion).  Transfers                 General transfer comment: NT, pt unable to assist with bed mobility and too weak to stand  Ambulation/Gait                Stairs            Wheelchair Mobility    Modified Rankin (Stroke Patients Only)       Balance  Overall balance assessment: Needs assistance Sitting-balance support: Single extremity supported;Feet supported Sitting balance-Leahy Scale: Poor Sitting balance - Comments: once assisted upright, pt able to maintain upright posture with UE support, tends to lateral left lean with fatigue Postural control: Left lateral lean                                   Pertinent Vitals/Pain Pain Assessment: No/denies pain    Home Living Family/patient expects to be discharged to:: Private residence     Type of Home: House Home Access: Stairs to enter     Home Layout: One level        Prior Function Level of Independence: Independent with assistive device(s)         Comments: ambulates with SPC     Hand Dominance        Extremity/Trunk Assessment   Upper Extremity Assessment Upper Extremity Assessment: Generalized weakness;LUE deficits/detail;RUE deficits/detail RUE Deficits / Details: able to open/close right hand, right hand with edema, no other active movement of UE observed LUE Deficits / Details: L shoulder with limited active movement, reports "broken" (hx of fx clavicle)    Lower Extremity Assessment Lower Extremity Assessment: Generalized weakness;RLE deficits/detail RLE Deficits / Details: grossly 2+/5 throughout, muscular atrophy present RLE Coordination: decreased gross motor  Communication   Communication: No difficulties  Cognition Arousal/Alertness: Awake/alert Behavior During Therapy: Flat affect Overall Cognitive Status: No family/caregiver present to determine baseline cognitive functioning                                 General Comments: answers simple questions, slow to process/respond, follows simple commands      General Comments      Exercises     Assessment/Plan    PT Assessment Patient needs continued PT services  PT Problem List Decreased strength;Decreased coordination;Decreased knowledge of use of  DME;Decreased balance;Decreased activity tolerance;Decreased range of motion;Decreased mobility       PT Treatment Interventions DME instruction;Functional mobility training;Balance training;Gait training;Therapeutic activities;Neuromuscular re-education;Stair training;Therapeutic exercise    PT Goals (Current goals can be found in the Care Plan section)  Acute Rehab PT Goals PT Goal Formulation: With patient Time For Goal Achievement: 04/04/19 Potential to Achieve Goals: Good    Frequency Min 3X/week   Barriers to discharge        Co-evaluation               AM-PAC PT "6 Clicks" Mobility  Outcome Measure Help needed turning from your back to your side while in a flat bed without using bedrails?: Total Help needed moving from lying on your back to sitting on the side of a flat bed without using bedrails?: Total Help needed moving to and from a bed to a chair (including a wheelchair)?: Total Help needed standing up from a chair using your arms (e.g., wheelchair or bedside chair)?: Total Help needed to walk in hospital room?: Total Help needed climbing 3-5 steps with a railing? : Total 6 Click Score: 6    End of Session   Activity Tolerance: Patient tolerated treatment well Patient left: in bed;with call bell/phone within reach;with nursing/sitter in room Nurse Communication: Mobility status PT Visit Diagnosis: Other abnormalities of gait and mobility (R26.89)    Time: 1433-1450 PT Time Calculation (min) (ACUTE ONLY): 17 min   Charges:   PT Evaluation $PT Eval Low Complexity: 1 Low         Zenovia Jarred, PT, DPT Acute Rehabilitation Services Office: [phone] Pager: [phone]  Sarajane Jews 03/21/2019, 3:51 PM

## 2019-03-21 NOTE — Progress Notes (Addendum)
PROGRESS NOTE    Mason Hart  [id number] DOB: Oct 03, 1956 DOA: 03/18/2019 PCP: [name], [name], [name]    Brief Narrative:  63 year old male who presented with altered mental status and possible seizure. He does have significant past medical history of seizures, medical noncompliance. Apparently he was found on the floor by his family, he was very rigid and foaming from his mouth. On his initial physical examination he was very somnolent, he was oriented only to himself. Pressure was 135/83, heart rate 59, respiratory rate 12, temperature 32.8 C, oxygen saturation 98%,his lungs were clear to auscultation, heart S1-S2 present and rhythmic, abdomen was soft, his left lower extremity was erythematous and larger than the right side. Sodium 139, potassium 3.7, chloride 107, bicarb 24, glucose 77, BUN 11, creatinine 1.75, white count 3.9, hemoglobin 9.3, hematocrit 29.8, platelets 146.His urine analysis was negative for infection.Alcohol level less than 10, toxicologyscreen negative.His head and cervical CT were negative. Chest x rayhad bibasilar atelectasis. His EKG had 62 bpm, normal axis, sinus rhythm, right bundle branch block, positive PVCs, he is manually corrected QTC is 420.  Patient was admitted to the hospital with working diagnosis of uncontrolled seizures    Assessment & Plan:   Principal Problem:   Breakthrough seizure (HCC) Active Problems:   Alcohol use   Hypothermia   Edema of left lower extremity   Chronic anemia   Pressure injury of skin   1. Uncontrolled seizures. Keppra changed to po with good toleration. Maintain euglycemia. No further seizure activity and mentation continue to improve. He ruled out for CVA. Has a chronic left upper extremity proximal weakness.   2. Hypoglycemia. Patient tolerating po well, will discontinue IV dextrose and will continue capillary glucose monitoring q 4 H.  His c peptide is low, consistent with low endogenous  insulin, appropriate response to hypoglycemia. I suspect low liver glycogen storage due to chronic alcohol consumption.   3. Hypothermia. Clinically has resolved, likely multifactorial, temperature 98.2, with no warming devices.   4. Hypothyroid. Tolerating well levothyroxine.  5. Anemia. Suspected chronic and multifactorial. Follow up on iron panel. No current indication for PRBC transfusion.   6. Hypokalemia. K at 3,9 and Na at 139 with serum bicarbonate at 25 and preserved renal function with serum cr at 0,83. Patient tolerating po well.   7. HTN. Blood pressure systolic 105 to 125 with HR 63 to 65, will hold on metoprolol for now. Will avoid b blockade in the setting of potential recurrent hypoglycemia.    8. Alcohol abuse with alcohol induced cirrhosis. Will continue as needed lorazepam, no signs of active withdrawal. Continue thiamine. Liver US with cirrhosis.   DVT prophylaxis:enoxaparin Code Status:full Family Communication:no family at the bedside Disposition Plan/ discharge barriers:pending Physical therapy evaluation, tentative discharge in am.  Body mass index is 27.72 kg/m. Malnutrition Type:  Nutrition Problem: Inadequate oral intake Etiology: inability to eat   Malnutrition Characteristics:  Signs/Symptoms: NPO status   Nutrition Interventions:  Interventions: Ensure Enlive (each supplement provides 350kcal and 20 grams of protein), MVI  RN Pressure Injury Documentation:    Consultants:     Procedures:     Antimicrobials:       Subjective: Patient is feeling better, tolerating po well, no nausea or vomiting, no chest pain or dyspnea. Chronic left upper extremity weakness,   Objective: Vitals:   03/20/19 1800 03/20/19 2034 03/21/19 0218 03/21/19 0528  BP: (!) 153/94 114/70 105/61 126/82  Pulse:  74 63 65  Resp: 18 18 20  18  Temp: 99.9 F (37.7 C) 99 F (37.2 C) 98.2 F (36.8 C) 97.9 F (36.6 C)  TempSrc: Oral Oral Oral  Oral  SpO2:  97% 100% 100%  Weight:  95.3 kg    Height:   (1.854 m)      Intake/Output Summary (Last 24 hours) at 03/21/2019 1322 Last data filed at 03/21/2019 1200 Gross per 24 hour  Intake 1027.08 ml  Output 2400 ml  Net -1372.92 ml   Filed Weights   03/18/19 2310 03/20/19 2034  Weight: 93.8 kg 95.3 kg    Examination:   General: deconditioned,  Neurology: Awake and alert, non focal. Left upper extremity proximal weakness.  E ENT: mild pallor, no icterus, oral mucosa moist Cardiovascular: No JVD. S1-S2 present, rhythmic, no gallops, rubs, or murmurs. No lower extremity edema. Pulmonary: positive breath sounds bilaterally, adequate air movement, no wheezing, rhonchi or rales. Gastrointestinal. Abdomen with no organomegaly, non tender, no rebound or guarding Skin. No rashes Musculoskeletal: no joint deformities     Data Reviewed: I have personally reviewed following labs and imaging studies  CBC: Recent Labs  Lab 03/18/19 1932 03/20/19 0321  WBC 3.9* 5.6  NEUTROABS 2.5 3.8  HGB 9.3* 7.9*  HCT 29.8* 26.4*  MCV 88.2 91.7  PLT 146* 108*   Basic Metabolic Panel: Recent Labs  Lab 03/18/19 1932 03/18/19 2328 03/20/19 0321 03/21/19 0349  NA 139  --  135 139  K 3.7  --  3.3* 3.9  CL 107  --  104 108  CO2 24  --  27 25  GLUCOSE 77  --  479* 77  BUN 11  --  8 10  CREATININE 0.75  --  0.91 0.83  CALCIUM 8.7*  --  8.3* 8.7*  MG  --  1.9  --   --    GFR: Estimated Creatinine Clearance: 101.6 mL/min (by C-G formula based on SCr of 0.83 mg/dL). Liver Function Tests: Recent Labs  Lab 03/18/19 1932  AST 36  ALT 28  ALKPHOS 80  BILITOT 0.2*  PROT 6.9  ALBUMIN 3.0*   No results for input(s): LIPASE, AMYLASE in the last 168 hours. No results for input(s): AMMONIA in the last 168 hours. Coagulation Profile: No results for input(s): INR, PROTIME in the last 168 hours. Cardiac Enzymes: Recent Labs  Lab 03/18/19 1932 03/18/19 2328  CKTOTAL  --  154   TROPONINI <0.03  --    BNP (last 3 results) No results for input(s): PROBNP in the last 8760 hours. HbA1C: No results for input(s): HGBA1C in the last 72 hours. CBG: Recent Labs  Lab 03/20/19 1643 03/20/19 1830 03/20/19 1959 03/21/19 0748 03/21/19 1149  GLUCAP 115* 86 82 70 115*   Lipid Profile: No results for input(s): CHOL, HDL, LDLCALC, TRIG, CHOLHDL, LDLDIRECT in the last 72 hours. Thyroid Function Tests: Recent Labs    03/18/19 2328  TSH 5.010*  FREET4 0.77*   Anemia Panel: No results for input(s): VITAMINB12, FOLATE, FERRITIN, TIBC, IRON, RETICCTPCT in the last 72 hours.    Radiology Studies: I have reviewed all of the imaging during this hospital visit personally     Scheduled Meds: . aspirin EC  81 mg Oral Daily  . chlorhexidine  15 mL Mouth Rinse BID  . enoxaparin (LOVENOX) injection  40 mg Subcutaneous QHS  . feeding supplement  1 Container Oral TID BM  . folic acid  1 mg Oral Daily  . levETIRAcetam  1,500 mg Oral BID  .  levothyroxine  25 mcg Oral Q0600  . mouth rinse  15 mL Mouth Rinse q12n4p  . metoprolol tartrate  25 mg Oral BID  . multivitamin with minerals  1 tablet Oral Daily  . tamsulosin  0.4 mg Oral Daily  . thiamine  100 mg Oral Daily   Continuous Infusions: . dextrose 5 % and 0.45 % NaCl with KCl 40 mEq/L 75 mL/hr at 03/21/19 1211     LOS: 2 days        Mauricio Annett Gula, MD

## 2019-03-21 NOTE — Progress Notes (Signed)
Pharmacy IV to PO conversion  The patient is receiving Keppra by the intravenous route.  Based on criteria approved by the Pharmacy and Therapeutics Committee and the Medical Executive Committee, the medication is being converted to the equivalent oral dose form.   No active GI bleeding or impaired absorption  Not s/p esophagectomy  Documented ability to take oral medications for > 24 hr  Plan to continue treatment for at least 1 day  If you have any questions about this conversion, please contact the Pharmacy Department (ext [phone]).  Thank you.  Bernadene Person, PharmD, BCPS [phone] 03/21/2019, 12:03 PM

## 2019-03-22 LAB — BASIC METABOLIC PANEL
Anion gap: 5 (ref 5–15)
BUN: 11 mg/dL (ref 8–23)
CO2: 26 mmol/L (ref 22–32)
Calcium: 8.7 mg/dL — ABNORMAL LOW (ref 8.9–10.3)
Chloride: 108 mmol/L (ref 98–111)
Creatinine, Ser: 0.76 mg/dL (ref 0.61–1.24)
GFR calc Af Amer: >60 mL/min (ref >60)
GFR calc non Af Amer: >60 mL/min (ref >60)
Glucose, Bld: 75 mg/dL (ref 70–99)
Potassium: 3.9 mmol/L (ref 3.5–5.1)
Sodium: 139 mmol/L (ref 135–145)

## 2019-03-22 LAB — GLUCOSE, CAPILLARY
Glucose-Capillary: 77 mg/dL (ref 70–99)
Glucose-Capillary: 116 mg/dL — ABNORMAL HIGH (ref 70–99)
Glucose-Capillary: 64 mg/dL — ABNORMAL LOW (ref 70–99)
Glucose-Capillary: 65 mg/dL — ABNORMAL LOW (ref 70–99)
Glucose-Capillary: 57 mg/dL — ABNORMAL LOW (ref 70–99)
Glucose-Capillary: 112 mg/dL — ABNORMAL HIGH (ref 70–99)
Glucose-Capillary: 63 mg/dL — ABNORMAL LOW (ref 70–99)
Glucose-Capillary: 67 mg/dL — ABNORMAL LOW (ref 70–99)
Glucose-Capillary: 63 mg/dL — ABNORMAL LOW (ref 70–99)
Glucose-Capillary: 71 mg/dL (ref 70–99)
Glucose-Capillary: 90 mg/dL (ref 70–99)
Glucose-Capillary: 83 mg/dL (ref 70–99)

## 2019-03-22 LAB — IRON AND TIBC
Iron: 67 ug/dL (ref 45–182)
Saturation Ratios: 26 % (ref 17.9–39.5)
TIBC: 258 ug/dL (ref 250–450)
UIBC: 191 ug/dL

## 2019-03-22 LAB — FERRITIN: Ferritin: 56 ng/mL (ref 24–336)

## 2019-03-22 LAB — TRANSFERRIN: Transferrin: 184 mg/dL (ref 180–329)

## 2019-03-22 NOTE — Progress Notes (Signed)
Hypoglycemic Event  CBG:67  Treatment: juice with sugar and applesauce  Symptoms: none  Follow-up CBG: Time:11:36 CBG Result: 63   Possible Reasons for Event: unknown  Comments/MD notified: More juice given and CBG up to 71 @ 1205. Fed lunch    Melton Alar

## 2019-03-22 NOTE — Progress Notes (Addendum)
Hypoglycemic Event  CBG: 65  Treatment: 8 oz juice/soda  Symptoms: None  Follow-up CBG: Time: 0510 CBG Result: 116  Possible Reasons for Event: Unknown  Comments/MD notified:Bodenheimer NP     Consepcion Hearing

## 2019-03-22 NOTE — Evaluation (Signed)
Occupational Therapy Evaluation Patient Details Name: Mason CarpenJohn Moncus MRN: [id number] DOB: 02/13/1956 Today's Date: 03/22/2019    History of Present Illness 63 year old male who presented with altered mental status and uncontrolled seizure.  Past medical history of seizures, medical noncompliance, hypoglycemia, hypokalemia, L clavicle fx   Clinical Impression   This 63 yo male admitted with above presents to acute OT with decreased AROM Bil UEs, decreased mobility, decreased transfers, decreased cognition all affecting his safety and independence with basic ADLs as he reports pta he was independent with all basic ADLs. He will benefit from acute OT with follow up OT at SNF to work back towards PLOF.    Follow Up Recommendations  SNF;Supervision/Assistance - 24 hour    Equipment Recommendations  Other (comment)(TBD at next venue)       Precautions / Restrictions Precautions Precautions: Fall Precaution Comments: R hemiparesis (pt reports both arms working normally pta) Restrictions Weight Bearing Restrictions: No      Mobility Bed Mobility Overal bed mobility: Needs Assistance Bed Mobility: Supine to Sit;Sit to Supine     Supine to sit: Mod assist Sit to supine: Mod assist   General bed mobility comments: able to move legs almost to EOB with increased time, then A to reach with RUE for left bed rail with pt able to pull himself around a little more, then he was assisted with bed pad to come all the way around to get feet on floor with him then being able to come up to sit with minguard A; A for legs to lay back down.  Transfers Overall transfer level: Needs assistance Equipment used: Rolling walker (2 wheeled) Transfers: Sit to/from Stand Sit to Stand: Mod assist;From elevated surface;+2 safety/equipment              Balance Overall balance assessment: Needs assistance Sitting-balance support: No upper extremity supported;Feet supported Sitting balance-Leahy Scale:  Fair     Standing balance support: Bilateral upper extremity supported Standing balance-Leahy Scale: Poor Standing balance comment: reliant on RW and additional external support of therapist                           ADL either performed or assessed with clinical judgement   ADL Overall ADL's : Needs assistance/impaired Eating/Feeding: Minimal assistance;Bed level   Grooming: Moderate assistance;Sitting   Upper Body Bathing: Moderate assistance;Sitting   Lower Body Bathing: Total assistance Lower Body Bathing Details (indicate cue type and reason): Mod A sit<>stand from raised surface Upper Body Dressing : Maximal assistance;Sitting   Lower Body Dressing: Total assistance Lower Body Dressing Details (indicate cue type and reason): Mod A sit<>stand from raised surface Toilet Transfer: Moderate assistance;+2 for physical assistance;RW Toilet Transfer Details (indicate cue type and reason): side step up [facility] Toileting- Clothing Manipulation and Hygiene: Total assistance Toileting - Clothing Manipulation Details (indicate cue type and reason): Mod A to maintain standing             Vision Baseline Vision/History: Wears glasses Patient Visual Report: No change from baseline              Pertinent Vitals/Pain Pain Assessment: No/denies pain     Hand Dominance Right   Extremity/Trunk Assessment Upper Extremity Assessment RUE Deficits / Details: Able to move all joints of RUE, but slowly. Needs A to raise at shoulder, when asked to open his right hand the 4 and 5 th digits lag behind 1-3. Can oppose all digits LUE Deficits /  Details: L shoulder with limited active movement, reports "broken" (hx of fx clavicle); rest WNL           Communication Communication Communication: No difficulties   Cognition Arousal/Alertness: Awake/alert Behavior During Therapy: WFL for tasks assessed/performed Overall Cognitive Status: No family/caregiver present to  determine baseline cognitive functioning                                 General Comments: pt answers questions and follows all one step commands. Oriented to place and reason here, but not year.              Home Living Family/patient expects to be discharged to:: Private residence Living Arrangements: Other (Comment)(roommate)   Type of Home: House Home Access: Stairs to enter     Home Layout: One level     Bathroom Shower/Tub: (sponge bathes)   Bathroom Toilet: Standard     Home Equipment: Walker - 2 wheels   Additional Comments: Has roommate, but she does not help him. Does not drive, rides bus. Niece makes sure he takes his meds 2x/day      Prior Functioning/Environment Level of Independence: Independent with assistive device(s)        Comments: ambulates with SPC        OT Problem List: Decreased strength;Decreased range of motion;Impaired balance (sitting and/or standing);Decreased safety awareness;Decreased coordination;Impaired tone      OT Treatment/Interventions: Self-care/ADL training;Balance training;DME and/or AE instruction;Patient/family education;Therapeutic activities;Therapeutic exercise    OT Goals(Current goals can be found in the care plan section) Acute Rehab OT Goals Patient Stated Goal: to go home OT Goal Formulation: With patient Time For Goal Achievement: 04/05/19 Potential to Achieve Goals: Good  OT Frequency: Min 2X/week(could benefit from 3)   Barriers to D/C: Decreased caregiver support             AM-PAC OT "6 Clicks" Daily Activity     Outcome Measure Help from another person eating meals?: A Little Help from another person taking care of personal grooming?: A Lot Help from another person toileting, which includes using toliet, bedpan, or urinal?: A Lot Help from another person bathing (including washing, rinsing, drying)?: A Lot Help from another person to put on and taking off regular upper body  clothing?: A Lot Help from another person to put on and taking off regular lower body clothing?: Total 6 Click Score: 12   End of Session Equipment Utilized During Treatment: Gait belt;Rolling walker Nurse Communication: (NT A'd me with sit<>stand and pt back into bed)  Activity Tolerance: Patient tolerated treatment well Patient left: in bed;with call bell/phone within reach;with bed alarm set  OT Visit Diagnosis: Unsteadiness on feet (R26.81);Other abnormalities of gait and mobility (R26.89);History of falling (Z91.81);Other symptoms and signs involving cognitive function;Hemiplegia and hemiparesis Hemiplegia - Right/Left: Right Hemiplegia - dominant/non-dominant: Dominant                Time: 1287-8676 OT Time Calculation (min): 18 min Charges:  OT General Charges $OT Visit: 1 Visit OT Evaluation $OT Eval Moderate Complexity: 1 Mod  Ignacia Palma, OTR/L Acute [employer] Pager [phone] Office [phone]     Evette Georges 03/22/2019, 8:40 AM

## 2019-03-22 NOTE — Progress Notes (Signed)
Hypoglycemic Event  CBG: 64  Treatment: 4 oz juice/soda  Symptoms: None  Follow-up CBG: Time: 0451 CBG Result: 65  Possible Reasons for Event: Unknown  Comments/MD notified:Bodenheimer NP     Consepcion Hearing

## 2019-03-22 NOTE — Progress Notes (Signed)
Hypoglycemic Event  CBG: 57  Treatment: juice given  Symptoms: None  Follow-up CBG: Time:7:49 CBG Result 63  Possible Reasons for Event: Unknown  Comments/MD notified:yes   Melton Alar

## 2019-03-22 NOTE — Progress Notes (Signed)
PROGRESS NOTE    Mason Hart  WJX:[id number] DOB: [birth year] DOA: 03/18/2019 PCP: [name], [name]    Brief Narrative:  63 year old male who presented with altered mental status and possible seizure. He does have significant past medical history of seizures, medical noncompliance. Apparently he was found on the floor by his family, he was very rigid and foaming from his mouth. On his initial physical examination he was very somnolent, he was oriented only to himself. Pressure was 135/83, heart rate 59, respiratory rate 12, temperature 32.8 C, oxygen saturation 98%,his lungs were clear to auscultation, heart S1-S2 present and rhythmic, abdomen was soft, his left lower extremity was erythematous and larger than the right side. Sodium 139, potassium 3.7, chloride 107, bicarb 24, glucose 77, BUN 11, creatinine 1.75, white count 3.9, hemoglobin 9.3, hematocrit 29.8, platelets 146.His urine analysis was negative for infection.Alcohol level less than 10, toxicologyscreen negative.His head and cervical CT were negative. Chest x rayhad bibasilar atelectasis. His EKG had 62 bpm, normal axis, sinus rhythm, right bundle branch block, positive PVCs, he is manually corrected QTC is 420.  Patient was admitted to the hospital with working diagnosis of uncontrolled seizures   Assessment & Plan:   Principal Problem:   Breakthrough seizure (HCC) Active Problems:   Alcohol use   Hypothermia   Edema of left lower extremity   Chronic anemia   Pressure injury of skin  1. Uncontrolled seizures/ ruled out for acute CVA.Continue with Keppra po. No further seizure activity and mentation has significantly improve today. Patient with significant weakness, he has aggred for SNF per physical therapy recommendations, will consult social work for placement.   2. Hypoglycemia with non specified calorie protein malnutrition.Patient is tolerating po well, he has been off IV dextrose with good  toleration. Will continue glucose check tid with meals. Continue nutritional supplements with ensure and encourage per nutrition recommendations.   3. Hypothermia.sepsis ruled out.   4. Hypothyroid.Continue with levothyroxine.  5. Anemia. Suspected chronic and multifactorial/ anemia of chronic disease. Iron panel with serum iron 67, TIBC 258, Ferritin 56, transferrin 184 with transferrin saturation 26. Continue supportive medical therapy.   6. Hypokalemia. Potassium has been corrected, patient tolerating po well.   7. HTN. Continue holding metoprolol, blood pressure has remained stable.   8. History of alcohol abuse with alcohol induced cirrhosis. No withdrawal symptoms, will continue neuro checks per unit protocol. Continue thiamine and multivitamins. Patient will benefit from SNF at discharge.   DVT prophylaxis:enoxaparin Code Status:full Family Communication:no family at the bedside Disposition Plan/ discharge barriers:pending placement at SNF  Body mass index is 27.72 kg/m. Malnutrition Type:  Nutrition Problem: Inadequate oral intake Etiology: inability to eat   Malnutrition Characteristics:    Nutrition Interventions:  Interventions: Ensure Enlive (each supplement provides 350kcal and 20 grams of protein), MVI  RN Pressure Injury Documentation:    Consultants:     Procedures:     Antimicrobials:       Subjective: Patient is feeling better this am, with no chest pain, no dyspnea, no nausea or vomiting. Continue to be very weak and deconditioned, physical therapy has recommended SNF and he agrees with this plan.   Objective: Vitals:   03/21/19 1338 03/21/19 1503 03/21/19 2100 03/22/19 0456  BP: (!) 103/56  116/60 116/70  Pulse: (!) 55  62 67  Resp: Temp: (!) 97.5 F (36.4 C)  98.3 F (36.8 C) 97.8 F (36.6 C)  TempSrc: Axillary  Axillary Oral  SpO2: 99% 99% 100% 100%  Weight:      Height:        Intake/Output  Summary (Last 24 hours) at 03/22/2019 0853 Last data filed at 03/21/2019 2011 Gross per 24 hour  Intake 1321.16 ml  Output 1420 ml  Net -98.84 ml   Filed Weights   03/18/19 2310 03/20/19 2034  Weight: 93.8 kg 95.3 kg    Examination:   General: deconditioned  Neurology: Awake and alert, left upper extremity weakness E ENT: mild pallor, no icterus, oral mucosa moist Cardiovascular: No JVD. S1-S2 present, rhythmic, no gallops, rubs, or murmurs. No lower extremity edema. Pulmonary: positive breath sounds bilaterally, adequate air movement, no wheezing, rhonchi or rales. Gastrointestinal. Abdomen with no organomegaly, non tender, no rebound or guarding Skin. No rashes Musculoskeletal: no joint deformities     Data Reviewed: I have personally reviewed following labs and imaging studies  CBC: Recent Labs  Lab 03/18/19 1932 03/20/19 0321  WBC 3.9* 5.6  NEUTROABS 2.5 3.8  HGB 9.3* 7.9*  HCT 29.8* 26.4*  MCV 88.2 91.7  PLT 146* 108*   Basic Metabolic Panel: Recent Labs  Lab 03/18/19 1932 03/18/19 2328 03/20/19 0321 03/21/19 0349 03/22/19 0446  NA 139  --  135 139 139  K 3.7  --  3.3* 3.9 3.9  CL 107  --  104 108 108  CO2 24  --  27 25 26   GLUCOSE 77  --  479* 77 75  BUN 11  --  8 10 11   CREATININE 0.75  --  0.91 0.83 0.76  CALCIUM 8.7*  --  8.3* 8.7* 8.7*  MG  --  1.9  --   --   --    GFR: Estimated Creatinine Clearance: 105.4 mL/min (by C-G formula based on SCr of 0.76 mg/dL). Liver Function Tests: Recent Labs  Lab 03/18/19 1932  AST 36  ALT 28  ALKPHOS 80  BILITOT 0.2*  PROT 6.9  ALBUMIN 3.0*   No results for input(s): LIPASE, AMYLASE in the last 168 hours. No results for input(s): AMMONIA in the last 168 hours. Coagulation Profile: No results for input(s): INR, PROTIME in the last 168 hours. Cardiac Enzymes: Recent Labs  Lab 03/18/19 1932 03/18/19 2328  CKTOTAL  --  154  TROPONINI <0.03  --    BNP (last 3 results) No results for input(s):  PROBNP in the last 8760 hours. HbA1C: No results for input(s): HGBA1C in the last 72 hours. CBG: Recent Labs  Lab 03/22/19 0454 03/22/19 0520 03/22/19 0729 03/22/19 0749 03/22/19 0815  GLUCAP 65* 116* 57* 63* 112*   Lipid Profile: No results for input(s): CHOL, HDL, LDLCALC, TRIG, CHOLHDL, LDLDIRECT in the last 72 hours. Thyroid Function Tests: No results for input(s): TSH, T4TOTAL, FREET4, T3FREE, THYROIDAB in the last 72 hours. Anemia Panel: Recent Labs    03/22/19 0446  FERRITIN 56  TIBC 258  IRON 67      Radiology Studies: I have reviewed all of the imaging during this hospital visit personally     Scheduled Meds: . aspirin EC  81 mg Oral Daily  . chlorhexidine  15 mL Mouth Rinse BID  . enoxaparin (LOVENOX) injection  40 mg Subcutaneous QHS  . feeding supplement  1 Container Oral TID BM  . folic acid  1 mg Oral Daily  . levETIRAcetam  1,500 mg Oral BID  . levothyroxine  25 mcg Oral Q0600  . mouth rinse  15 mL Mouth Rinse q12n4p  . multivitamin  with minerals  1 tablet Oral Daily  . tamsulosin  0.4 mg Oral Daily  . thiamine  100 mg Oral Daily   Continuous Infusions:   LOS: 3 days        Mason Hart Arrien, MD

## 2019-03-22 NOTE — [place] FL2 (Signed)
[place] MEDICAID FL2 LEVEL OF CARE SCREENING TOOL     IDENTIFICATION  Patient Name: Mason Hart Birthdate: [birth year] Sex: male Admission Date (Current Location): 03/18/2019  [facility] [place]   Facility and Address:  [facility],  [address], [place] [id number]      Provider Number: [id number]  Attending Physician Name and Address:  Mason KeensArrien, Mauricio Daniel,*  Relative Name and Phone Number:       Current Level of Care: Hospital Recommended Level of Care: Skilled Nursing Facility Prior Approval Number:    Date Approved/Denied:   PASRR Number: [id number]  Discharge Plan: SNF    Current Diagnoses: Patient Active Problem List   Diagnosis Date Noted  . Pressure injury of skin 03/19/2019  . Breakthrough seizure (HCC) 03/18/2019  . Edema of left lower extremity 03/18/2019  . Chronic anemia 03/18/2019  . Anemia, normocytic normochromic 12/05/2018  . Endotracheally intubated 12/05/2018  . Hypothermia 12/05/2018  . Hyponatremia 12/05/2018  . Postictal state (HCC) 12/05/2018  . Sinusitis 12/05/2018  . Abnormal CXR 12/05/2018  . Hypotension 12/05/2018  . Bradycardia 12/05/2018  . ARF (acute renal failure) (HCC) 11/14/2018  . Clavicle fracture 11/14/2018  . Hypokalemia 06/24/2017  . Hypoglycemia 06/24/2017  . Alcohol use 06/24/2017  . Seizure (HCC) 06/24/2017  . Noncompliance with medication regimen   . Seizures (HCC)     Orientation RESPIRATION BLADDER Height & Weight     Self, Place  Normal Continent, External catheter Weight: 210 lb 1.6 oz (95.3 kg) Height:  6\' 1"  (185.4 cm)  BEHAVIORAL SYMPTOMS/MOOD NEUROLOGICAL BOWEL NUTRITION STATUS      Continent Diet(See dc summary)  AMBULATORY STATUS COMMUNICATION OF NEEDS Skin   Extensive Assist Verbally PU Stage and Appropriate Care, Other (Comment)(Penis skin tear, )   PU Stage 2 Dressing: (Sacral)                   Personal Care Assistance Level of Assistance   Bathing, Feeding, Dressing Bathing Assistance: Maximum assistance Feeding assistance: Independent Dressing Assistance: Maximum assistance     Functional Limitations Info  Sight, Hearing, Speech Sight Info: Adequate Hearing Info: Adequate Speech Info: Adequate    SPECIAL CARE FACTORS FREQUENCY  PT (By licensed PT), OT (By licensed OT)     PT Frequency: 5x/week OT Frequency: 5x/week            Contractures Contractures Info: Not present    Additional Factors Info  Code Status, Allergies Code Status Info: Full Allergies Info: NKA           Current Medications (03/22/2019):  This is the current hospital active medication list Current Facility-Administered Medications  Medication Dose Route Frequency Provider Last Rate Last Dose  . aspirin EC tablet 81 mg  81 mg Oral Daily Arrien, York RamMauricio Daniel, MD   81 mg at 03/22/19 1103  . chlorhexidine (PERIDEX) 0.12 % solution 15 mL  15 mL Mouth Rinse BID Regino Giovanniathore, Vasundhra, MD   15 mL at 03/21/19 2011  . enoxaparin (LOVENOX) injection 40 mg  40 mg Subcutaneous QHS Lister Giovanniathore, Vasundhra, MD   40 mg at 03/21/19 2011  . feeding supplement (BOOST / RESOURCE BREEZE) liquid 1 Container  1 Container Oral TID BM Arrien, York RamMauricio Daniel, MD   1 Container at 03/21/19 2011  . folic acid (FOLVITE) tablet 1 mg  1 mg Oral Daily Arrien, York RamMauricio Daniel, MD   1 mg at 03/22/19 1104  . levETIRAcetam (KEPPRA) tablet 1,500 mg  1,500 mg Oral  BID Danford Bad, RPH   1,500 mg at 03/22/19 1103  . levothyroxine (SYNTHROID) tablet 25 mcg  25 mcg Oral Q0600 Mason Keens, MD   25 mcg at 03/22/19 0524  . MEDLINE mouth rinse  15 mL Mouth Rinse q12n4p Ryann Giovanni, MD   15 mL at 03/21/19 1605  . multivitamin with minerals tablet 1 tablet  1 tablet Oral Daily Lea Giovanni, MD   1 tablet at 03/22/19 1103  . tamsulosin (FLOMAX) capsule 0.4 mg  0.4 mg Oral Daily Arrien, York Ram, MD   0.4 mg at 03/22/19 1104  . thiamine (VITAMIN B-1)  tablet 100 mg  100 mg Oral Daily Arrien, York Ram, MD   100 mg at 03/22/19 1105     Discharge Medications: Please see discharge summary for a list of discharge medications.  Relevant Imaging Results:  Relevant Lab Results:   Additional Information SSN: [id number]  Coralyn Helling, LCSW

## 2019-03-23 LAB — BASIC METABOLIC PANEL
Anion gap: 6 (ref 5–15)
BUN: 13 mg/dL (ref 8–23)
CO2: 26 mmol/L (ref 22–32)
Calcium: 8.6 mg/dL — ABNORMAL LOW (ref 8.9–10.3)
Chloride: 105 mmol/L (ref 98–111)
Creatinine, Ser: 0.76 mg/dL (ref 0.61–1.24)
GFR calc Af Amer: >60 mL/min (ref >60)
GFR calc non Af Amer: >60 mL/min (ref >60)
Glucose, Bld: 81 mg/dL (ref 70–99)
Potassium: 3.7 mmol/L (ref 3.5–5.1)
Sodium: 137 mmol/L (ref 135–145)

## 2019-03-23 LAB — GLUCOSE, CAPILLARY
Glucose-Capillary: 79 mg/dL (ref 70–99)
Glucose-Capillary: 76 mg/dL (ref 70–99)
Glucose-Capillary: 79 mg/dL (ref 70–99)

## 2019-03-23 NOTE — TOC Initial Note (Signed)
Transition of Care [facility]) - Initial/Assessment Note    Patient Details  Name: Mason Hart MRN: [id number] Date of Birth: [birth year]  [facility]) CM/SW Contact:    Coralyn Helling, LCSW Phone Number: 03/23/2019, 11:31 AM  Clinical Narrative: LCSW spoke with patients niece, Anissa. Anissa reports that spouse and son do not communicate with patient any more after spouse left patient a few years ago. She reports that she is patient's financial POA and is working on Nurse, learning disability. Anissa reports that patient has a friend that lives in is home that is a bad influence and provides alcohol for him. Niece reports that she checks on patient 2x a day.   Anissa is agreeable for patient to return to rehab at [facility] and reports patient made good progress the last time he went, but declined when he went home with friend. Anissa expressed concerns for patients long term care.                  Expected Discharge Plan: Skilled Nursing Facility Barriers to Discharge: Continued Medical Work up, English as a second language teacher   Patient Goals and CMS Choice Patient states their goals for this hospitalization and ongoing recovery are:: return home CMS Medicare.gov Compare Post Acute Care list provided to:: Other (Comment Required)(Niece) Choice offered to / list presented to : (Niece)  Expected Discharge Plan and Services Expected Discharge Plan: Skilled Nursing Facility In-house Referral: NA Discharge Planning Services: NA Post Acute Care Choice: Skilled Nursing Facility Living arrangements for the past 2 months: Single Family Home                 DME Arranged: N/A DME Agency: NA       HH Arranged: NA HH Agency: NA        Prior Living Arrangements/Services Living arrangements for the past 2 months: Single Family Home Lives with:: Roommate Patient language and need for interpreter reviewed:: No        Need for Family Participation in Patient Care: Yes (Comment) Care giver  support system in place?: Yes (comment)   Criminal Activity/Legal Involvement Pertinent to Current Situation/Hospitalization: No - Comment as needed  Activities of Daily Living Home Assistive Devices/Equipment: None ADL Screening (condition at time of admission) Patient's cognitive ability adequate to safely complete daily activities?: No Is the patient deaf or have difficulty hearing?: No Does the patient have difficulty seeing, even when wearing glasses/contacts?: No Does the patient have difficulty concentrating, remembering, or making decisions?: Yes Patient able to express need for assistance with ADLs?: No Does the patient have difficulty dressing or bathing?: Yes Independently performs ADLs?: No Communication: Independent Dressing (OT): Needs assistance Is this a change from baseline?: Pre-admission baseline Grooming: Needs assistance Is this a change from baseline?: Pre-admission baseline Feeding: Needs assistance Is this a change from baseline?: Pre-admission baseline Bathing: Needs assistance Is this a change from baseline?: Pre-admission baseline Toileting: Needs assistance Is this a change from baseline?: Pre-admission baseline In/Out Bed: Needs assistance Is this a change from baseline?: Pre-admission baseline Does the patient have difficulty walking or climbing stairs?: No Weakness of Legs: None Weakness of Arms/Hands: None  Permission Sought/Granted Permission sought to share information with : Facility Medical sales representative, Family Supports(Patient not oriented. ) Permission granted to share information with : Yes, Verbal Permission Granted  Share Information with NAME: Anissa  Permission granted to share info w AGENCY: [employer]  Permission granted to share info w Relationship: Niece     Emotional Assessment Appearance::  Appears stated age Attitude/Demeanor/Rapport: Unable to Assess Affect (typically observed): Unable to Assess Orientation: :  Oriented to Self Alcohol / Substance Use: Alcohol Use Psych Involvement: No (comment)  Admission diagnosis:  Somnolence [R40.0] Seizure [facility](HCC) [R56.9] Patient Active Problem List   Diagnosis Date Noted  . Pressure injury of skin 03/19/2019  . Breakthrough seizure (HCC) 03/18/2019  . Edema of left lower extremity 03/18/2019  . Chronic anemia 03/18/2019  . Anemia, normocytic normochromic 12/05/2018  . Endotracheally intubated 12/05/2018  . Hypothermia 12/05/2018  . Hyponatremia 12/05/2018  . Postictal state (HCC) 12/05/2018  . Sinusitis 12/05/2018  . Abnormal CXR 12/05/2018  . Hypotension 12/05/2018  . Bradycardia 12/05/2018  . ARF (acute renal failure) (HCC) 11/14/2018  . Clavicle fracture 11/14/2018  . Hypokalemia 06/24/2017  . Hypoglycemia 06/24/2017  . Alcohol use 06/24/2017  . Seizure (HCC) 06/24/2017  . Noncompliance with medication regimen   . Seizures (HCC)    PCP:  [name], [name], [name] Pharmacy:   [facility] [address] - [place], [place] - [id number] [place] [address] Phone: [phone] Fax: [phone]     Social Determinants of Health (SDOH) Interventions    Readmission Risk Interventions Readmission Risk Prevention Plan 03/23/2019  Transportation Screening Complete  PCP or Specialist Appt within 5-7 Days Not Complete  Home Care Screening Complete  Medication Review (RN CM) Not Complete  Some recent data might be hidden

## 2019-03-23 NOTE — Progress Notes (Signed)
PROGRESS NOTE    Coralie CarpenJohn Carrol  JXB:[id number] DOB: 08/09/1956 DOA: 03/18/2019 PCP: [name], [name], [name]    Brief Narrative:  63 year old male who presented with altered mental status and possible seizure. He does have significant past medical history of seizures, medical noncompliance. Apparently he was found on the floor by his family, he was very rigid and foaming from his mouth. On his initial physical examination he was very somnolent, he was oriented only to himself. Pressure was 135/83, heart rate 59, respiratory rate 12, temperature 32.8 C, oxygen saturation 98%,his lungs were clear to auscultation, heart S1-S2 present and rhythmic, abdomen was soft, his left lower extremity was erythematous and larger than the right side. Sodium 139, potassium 3.7, chloride 107, bicarb 24, glucose 77, BUN 11, creatinine 1.75, white count 3.9, hemoglobin 9.3, hematocrit 29.8, platelets 146.His urine analysis was negative for infection.Alcohol level less than 10, toxicologyscreen negative.His head and cervical CT were negative. Chest x rayhad bibasilar atelectasis. His EKG had 62 bpm, normal axis, sinus rhythm, right bundle branch block, positive PVCs, he is manually corrected QTC is 420.  Patient was admitted to the hospital with working diagnosis of uncontrolled seizures   Assessment & Plan:   Principal Problem:   Breakthrough seizure (HCC) Active Problems:   Alcohol use   Hypothermia   Edema of left lower extremity   Chronic anemia   Pressure injury of skin  1. Uncontrolled seizures/ ruled out for acute CVA.Tolerating well oral Keppra with no further seizure activity. Patient is tolerating po well, with no further episodes of hypoglycemia. Continue to be very weak and deconditioned. Pending SNF placement.   2. Hypoglycemia with non specified calorie protein malnutrition.His fasting glucose this am is 81. Capillary glucose 71, 90, 83, 79. Tolerating po well. Off IV  dextrose.   3. Hypothyroid.On levothyroxine with good toleration.   4. Anemia/ multifactorial/ anemia of chronic disease.Iron panel with serum iron 67, TIBC 258, Ferritin 56, transferrin 184 with transferrin saturation 26. Follow as outpatient.    6. Hypokalemia. This am K is 3,7, and patient tolerating po well.   7. HTN.Off antihypertensive medications, blood pressure 116/60 mmHg.   8. History of alcohol abuse with alcohol induced cirrhosis. Continue neuro checks per unit protocol, patient has shown no signs of withdrawal while hospitalized. Continue thiamine for now and multivitamins.   DVT prophylaxis:enoxaparin Code Status:full Family Communication:no family at the bedside Disposition Plan/ discharge barriers:pending placement at SNF  Body mass index is 27.72 kg/m. Malnutrition Type:  Nutrition Problem: Inadequate oral intake Etiology: inability to eat   Malnutrition Characteristics:   Nutrition Interventions:  Interventions: Ensure Enlive (each supplement provides 350kcal and 20 grams of protein), MVI  RN Pressure Injury Documentation:   Consultants:   Neurology   Procedures:     Antimicrobials:       Subjective: Patient is feeling well this am, with no nausea or vomiting and tolerating po well. Continue to be very weak and deconditioned.   Objective: Vitals:   03/21/19 2100 03/22/19 0456 03/22/19 2044 03/23/19 0118  BP: 116/60 116/70 126/63 116/60  Pulse: 62 67 72 79  Resp: 20 20 16 14   Temp: 98.3 F (36.8 C) 97.8 F (36.6 C) 98.4 F (36.9 C) 98.1 F (36.7 C)  TempSrc: Axillary Oral Oral Oral  SpO2: 100% 100% 100% 97%  Weight:      Height:        Intake/Output Summary (Last 24 hours) at 03/23/2019 0854 Last data filed at 03/23/2019 [id number] Gross  per 24 hour  Intake 360 ml  Output 2575 ml  Net -2215 ml   Filed Weights   03/18/19 2310 03/20/19 2034  Weight: 93.8 kg 95.3 kg    Examination:   General: Not in pain or  dyspnea. Deconditioned  Neurology: Awake and alert, non focal/ chronic left upper extremity weakness 2/3 proximal.  E ENT: no pallor, no icterus, oral mucosa moist Cardiovascular: No JVD. S1-S2 present, rhythmic, no gallops, rubs, or murmurs. No lower extremity edema. Pulmonary: positive breath sounds bilaterally, adequate air movement, no wheezing, rhonchi or rales. Gastrointestinal. Abdomen with, no organomegaly, non tender, no rebound or guarding Skin. No rashes Musculoskeletal: no joint deformities     Data Reviewed: I have personally reviewed following labs and imaging studies  CBC: Recent Labs  Lab 03/18/19 1932 03/20/19 0321  WBC 3.9* 5.6  NEUTROABS 2.5 3.8  HGB 9.3* 7.9*  HCT 29.8* 26.4*  MCV 88.2 91.7  PLT 146* 108*   Basic Metabolic Panel: Recent Labs  Lab 03/18/19 1932 03/18/19 2328 03/20/19 0321 03/21/19 0349 03/22/19 0446 03/23/19 0413  NA 139  --  135 139 139 137  K 3.7  --  3.3* 3.9 3.9 3.7  CL 107  --  104 108 108 105  CO2 24  --  GLUCOSE 77  --  479* 77 75 81  BUN 11  --  CREATININE 0.75  --  0.91 0.83 0.76 0.76  CALCIUM 8.7*  --  8.3* 8.7* 8.7* 8.6*  MG  --  1.9  --   --   --   --    GFR: Estimated Creatinine Clearance: 105.4 mL/min (by C-G formula based on SCr of 0.76 mg/dL). Liver Function Tests: Recent Labs  Lab 03/18/19 1932  AST 36  ALT 28  ALKPHOS 80  BILITOT 0.2*  PROT 6.9  ALBUMIN 3.0*   No results for input(s): LIPASE, AMYLASE in the last 168 hours. No results for input(s): AMMONIA in the last 168 hours. Coagulation Profile: No results for input(s): INR, PROTIME in the last 168 hours. Cardiac Enzymes: Recent Labs  Lab 03/18/19 1932 03/18/19 2328  CKTOTAL  --  154  TROPONINI <0.03  --    BNP (last 3 results) No results for input(s): PROBNP in the last 8760 hours. HbA1C: No results for input(s): HGBA1C in the last 72 hours. CBG: Recent Labs  Lab 03/22/19 1136 03/22/19 1205 03/22/19 1651  03/22/19 2047 03/23/19 0717  GLUCAP 63* 71 90 83 79   Lipid Profile: No results for input(s): CHOL, HDL, LDLCALC, TRIG, CHOLHDL, LDLDIRECT in the last 72 hours. Thyroid Function Tests: No results for input(s): TSH, T4TOTAL, FREET4, T3FREE, THYROIDAB in the last 72 hours. Anemia Panel: Recent Labs    03/22/19 0446  FERRITIN 56  TIBC 258  IRON 67      Radiology Studies: I have reviewed all of the imaging during this hospital visit personally     Scheduled Meds: . aspirin EC  81 mg Oral Daily  . chlorhexidine  15 mL Mouth Rinse BID  . enoxaparin (LOVENOX) injection  40 mg Subcutaneous QHS  . feeding supplement  1 Container Oral TID BM  . folic acid  1 mg Oral Daily  . levETIRAcetam  1,500 mg Oral BID  . levothyroxine  25 mcg Oral Q0600  . mouth rinse  15 mL Mouth Rinse q12n4p  . multivitamin with minerals  1 tablet Oral Daily  . tamsulosin  0.4 mg Oral Daily  . thiamine  100 mg Oral Daily   Continuous Infusions:   LOS: 4 days         Annett Gula, MD

## 2019-03-23 NOTE — Plan of Care (Signed)
  Problem: Education: Goal: Knowledge of General Education information will improve Description Including pain rating scale, medication(s)/side effects and non-pharmacologic comfort measures Outcome: Progressing   Problem: Health Behavior/Discharge Planning: Goal: Ability to manage health-related needs will improve Outcome: Progressing   Problem: Clinical Measurements: Goal: Ability to maintain clinical measurements within normal limits will improve Outcome: Progressing Goal: Will remain free from infection Outcome: Progressing Goal: Diagnostic test results will improve Outcome: Progressing Goal: Respiratory complications will improve Outcome: Progressing Goal: Cardiovascular complication will be avoided Outcome: Progressing   Problem: Activity: Goal: Risk for activity intolerance will decrease Outcome: Progressing   Problem: Nutrition: Goal: Adequate nutrition will be maintained Outcome: Progressing   Problem: Elimination: Goal: Will not experience complications related to bowel motility Outcome: Progressing Goal: Will not experience complications related to urinary retention Outcome: Progressing   Problem: Pain Managment: Goal: General experience of comfort will improve Outcome: Progressing   Problem: Skin Integrity: Goal: Risk for impaired skin integrity will decrease Outcome: Progressing   Problem: Education: Goal: Expressions of having a comfortable level of knowledge regarding the disease process will increase Outcome: Progressing   Problem: Coping: Goal: Ability to adjust to condition or change in health will improve Outcome: Progressing Goal: Ability to identify appropriate support needs will improve Outcome: Progressing   Problem: Health Behavior/Discharge Planning: Goal: Compliance with prescribed medication regimen will improve Outcome: Progressing   Problem: Medication: Goal: Risk for medication side effects will decrease Outcome: Progressing    Problem: Clinical Measurements: Goal: Complications related to the disease process, condition or treatment will be avoided or minimized Outcome: Progressing Goal: Diagnostic test results will improve Outcome: Progressing   Problem: Safety: Goal: Verbalization of understanding the information provided will improve Outcome: Progressing   Problem: Self-Concept: Goal: Level of anxiety will decrease Outcome: Progressing Goal: Ability to verbalize feelings about condition will improve Outcome: Progressing

## 2019-03-24 LAB — GLUCOSE, CAPILLARY
Glucose-Capillary: 87 mg/dL (ref 70–99)
Glucose-Capillary: 52 mg/dL — ABNORMAL LOW (ref 70–99)
Glucose-Capillary: 46 mg/dL — ABNORMAL LOW (ref 70–99)
Glucose-Capillary: 67 mg/dL — ABNORMAL LOW (ref 70–99)
Glucose-Capillary: 76 mg/dL (ref 70–99)
Glucose-Capillary: 64 mg/dL — ABNORMAL LOW (ref 70–99)
Glucose-Capillary: 66 mg/dL — ABNORMAL LOW (ref 70–99)
Glucose-Capillary: 81 mg/dL (ref 70–99)

## 2019-03-24 MED ORDER — ADULT MULTIVITAMIN W/MINERALS CH: 1.0000 | ORAL_TABLET | Freq: Every day | ORAL | 0 refills | Status: AC

## 2019-03-24 MED ORDER — LEVOTHYROXINE SODIUM 25 MCG PO TABS: 25.0000 ug | ORAL_TABLET | Freq: Every day | ORAL | 0 refills | Status: DC

## 2019-03-24 MED ORDER — LEVETIRACETAM 500 MG PO TABS
1000.0000 mg | ORAL_TABLET | Freq: Two times a day (BID) | ORAL | Status: DC
  Administered 2019-03-24 – 2019-03-25 (×2): 1000 mg via ORAL
  Filled 2019-03-24 (×2): qty 2

## 2019-03-24 NOTE — TOC Transition Note (Signed)
Transition of Care [facility]) - CM/SW Discharge Note   Patient Details  Name: Mason Hart MRN: [id number] Date of Birth: 03-May-1956  Transition of Care [facility]) CM/SW Contact:  Althea Charon, LCSW Phone Number: 03/24/2019, 11:28 AM   Clinical Narrative:   Patient is awaiting authorization from BC/BS. Patient [facility] at [facility]    Final next level of care: Skilled Nursing Facility Barriers to Discharge: Continued Medical Work up, English as a second language teacher   Patient Goals and CMS Choice Patient states their goals for this hospitalization and ongoing recovery are:: return home CMS Medicare.gov Compare Post Acute Care list provided to:: Other (Comment Required)(Niece) Choice offered to / list presented to : (Niece)  Discharge Placement                       Discharge Plan and Services In-house Referral: NA Discharge Planning Services: NA Post Acute Care Choice: Skilled Nursing Facility          DME Arranged: N/A DME Agency: NA       HH Arranged: NA HH Agency: NA        Social Determinants of Health (SDOH) Interventions     Readmission Risk Interventions Readmission Risk Prevention Plan 03/23/2019  Transportation Screening Complete  PCP or Specialist Appt within 5-7 Days Not Complete  Home Care Screening Complete  Medication Review (RN CM) Not Complete  Some recent data might be hidden

## 2019-03-24 NOTE — TOC Transition Note (Signed)
Transition of Care [facility]) - CM/SW Discharge Note   Patient Details  Name: Mason Hart MRN: [id number] Date of Birth: 1956-06-21  [facility]) CM/SW Contact:  Althea Charon, LCSW Phone Number: 03/24/2019, 1:19 PM   Clinical Narrative:   Patient going to [facility] care via [facility]. Patient and family agreeable with discharge plan. RN to please call [phone] (rm# [id number]) for report    Final next level of care: Skilled Nursing Facility Barriers to Discharge: No Barriers Identified   Patient Goals and CMS Choice Patient states their goals for this hospitalization and ongoing recovery are:: return home CMS Medicare.gov Compare Post Acute Care list provided to:: Other (Comment Required)(Niece) Choice offered to / list presented to : (Niece)  Discharge Placement PASRR number recieved: 03/24/19            Patient chooses bed at: [facility] Patient to be transferred to facility by: ptar Name of family member notified: niece Patient and family notified of of transfer: 03/24/19  Discharge Plan and Services In-house Referral: NA Discharge Planning Services: NA Post Acute Care Choice: Skilled Nursing Facility          DME Arranged: N/A DME Agency: NA       HH Arranged: NA HH Agency: NA        Social Determinants of Health (SDOH) Interventions     Readmission Risk Interventions Readmission Risk Prevention Plan 03/23/2019  Transportation Screening Complete  PCP or Specialist Appt within 5-7 Days Not Complete  Home Care Screening Complete  Medication Review (RN CM) Not Complete  Some recent data might be hidden

## 2019-03-24 NOTE — Progress Notes (Signed)
Hypoglycemic Event  CBG: 46   Treatment: 4 oz juice/soda  Symptoms: None  Follow-up CBG: Time:1325 CBG Result:67  Possible Reasons for Event: Unknown  Comments/MD notified:Dr Arrien notified.  Recheck CBG at 1339, BS was 76.    Cimone Fahey, Jonelle Sidle

## 2019-03-24 NOTE — Progress Notes (Addendum)
  Speech Language Pathology Treatment: Dysphagia  Patient Details Name: Mason Hart MRN: [id number] DOB: 03/09/1956 Today's Date: 03/24/2019 Time: 5366-4403 SLP Time Calculation (min) (ACUTE ONLY): 12 min  Assessment / Plan / Recommendation Clinical Impression  Today pt fully alert, much improved mentation compared to last Thursday. Voice is clear and strong without wet vocal quality.  SLP observed him consuming graham cracker and 3 ounces of water.  Pt passed 3 ounce water test.  No indication of aspiration.  Post=swallow he did prevent with throat clearing and cough with pt admitting he had phlegm backing up.  Suspect that acute dysphagia has resolved with improved mentation.  Advance to regular/thin.  Will follow up x1 given his subtle throat clearing/cough however suspect that is baseline for him.  Per MBS, he did appear with retrograde propulsion without awareness, concerning for potential dysmotility.    Educated pt to recommendations using teach back.   HPI HPI: 63 yo male adm to [facility] after being found down on floor, pt found to have seizures and was postical.  PMH + for dysphagia, ETOH use, anemia, hypoglycemia, abnormal CXR.  Cervical spine negative, CT and MRI head negative.  CXR 03/20/2019 showed mild bibasilar ATX, infiltrate.  WBC 3.9 and pt is afebrile.  Pt had prior MBS in January 2020 with recommendation for puree/thin.        SLP Plan  Continue with current plan of care       Recommendations  Diet recommendations: Regular;Thin liquid Liquids provided via: Cup;Straw Medication Administration: (as tolerated) Supervision: Intermittent supervision to cue for compensatory strategies Compensations: Slow rate;Small sips/bites(cough strongly if reflexive cough occuring) Postural Changes and/or Swallow Maneuvers: Seated upright 90 degrees;Upright 30-60 min after meal                Oral Care Recommendations: Oral care before and after PO(oral suction before and after, pt with  increased secretion retention) Follow up Recommendations: Skilled Nursing facility SLP Visit Diagnosis: Dysphagia, oropharyngeal phase (R13.12) Plan: Continue with current plan of care       GO                Mason Hart 03/24/2019, 3:15 PM  Donavan Burnet, MS [facility] SLP Acute Rehab Services Pager [phone] Office [phone]

## 2019-03-24 NOTE — Progress Notes (Signed)
Patient eating 90-100% of meals.  Blood sugar continuing to remain low at blood sugar checks.  MD aware.

## 2019-03-24 NOTE — TOC Progression Note (Signed)
Transition of Care [facility]) - Progression Note    Patient Details  Name: Jaksyn Feser MRN: [id number] Date of Birth: Sep 05, 1956  Transition of Care [facility]) CM/SW Contact  Althea Charon, LCSW Phone Number: 03/24/2019, 1:57 PM  Clinical Narrative:   Patients discharge was postponed due to low blood sugar. Facility is aware and CSW will continue to follow for medical readiness     Expected Discharge Plan: Skilled Nursing Facility Barriers to Discharge: No Barriers Identified  Expected Discharge Plan and Services Expected Discharge Plan: Skilled Nursing Facility In-house Referral: NA Discharge Planning Services: NA Post Acute Care Choice: Skilled Nursing Facility Living arrangements for the past 2 months: Single Family Home Expected Discharge Date: 03/24/19               DME Arranged: N/A DME Agency: NA       HH Arranged: NA HH Agency: NA         Social Determinants of Health (SDOH) Interventions    Readmission Risk Interventions Readmission Risk Prevention Plan 03/23/2019  Transportation Screening Complete  PCP or Specialist Appt within 5-7 Days Not Complete  Home Care Screening Complete  Medication Review (RN CM) Not Complete  Some recent data might be hidden

## 2019-03-24 NOTE — Discharge Summary (Addendum)
Physician Discharge Summary  Edgel Vi ZOX:[id number] DOB: 07-04-1956 DOA: 03/18/2019  PCP: [name], [name]  Admit date: 03/18/2019 Discharge date: 03/25/2019  Admitted From: Home  Disposition:   SNF  Recommendations for Outpatient Follow-up and new medication changes:  1. Follow up with Arrie Senate [name] in 7 days.  2. Continue to encourage nutrition.  3. Patient placed on levothyroxine, follow TSH as outpatient in 3 weeks.  4. Please continue checking capillary glucose at least three times daily.   Home Health: na   Equipment/Devices: na    Discharge Condition stable CODE STATUS: full  Diet recommendation: heart healthy   Brief/Interim Summary: 63 year old male who presented with altered mental status and possible seizures. He does have significant past medical history of seizures, and medical noncompliance. Apparently he was found on the floor by his family, he was very rigid and foaming from his mouth. On his initial physical examination he was very somnolent, he was oriented only to himself. His blood pressure was 135/83, heart rate 59, respiratory rate 12, temperature 32.8 C, oxygen saturation 98%,his lungs were clear to auscultation, heart S1-S2 present and rhythmic, abdomen was soft, his left lower extremity was erythematous and larger than the right side. Sodium 139, potassium 3.7, chloride 107, bicarb 24, glucose 77, BUN 11, creatinine 1.75, white count 3.9, hemoglobin 9.3, hematocrit 29.8, platelets 146.His urine analysis was negative for infection.Alcohol level less than 10, toxicologyscreen negative.His head and cervical CT were negative. Chest x rayhad bibasilar atelectasis. His EKG had 62 bpm, normal axis, sinus rhythm, right bundle branch block, positive PVCs, his manually corrected QTC was 420.  Patient was admitted to the hospital with working diagnosis of uncontrolled seizures.   1.  Uncontrolled seizures due to hypoglycemia.  Patient  ruled out for CVA.  Patient was admitted to the stepdown unit, he received intravenous Keppra, underwent electroencephalography showing generalized cerebral disturbance/metabolic encephalopathy, postictal state, normal drowse, no active epileptiform activity.  Clinically with no further seizures, Keppra was transitioned to by mouth route with no major complications.  Further work-up with brain MRI showed no acute intracranial abnormality.  Patient was seen by physical therapy with recommendations to continue therapy at a skilled nursing facility.  2.  Severe hypoglycemia, likely related to poor nutrition and low glycogen storages.  Patient had persistent hypoglycemia, he required D10 infusion, posteriorly transitioned to D5 infusion.  His diet was advanced with good toleration, his fasting glucose on the day of discharge off intravenous dextrose is 87.  Further work-up with C-peptide was low at 1.0, consistent to normal response to hypoglycemia.  Will recommend to continue checking capillary glucose at least three times daily.  3.  Hypothyroid.  His TSH was 5.0, he has been placed on 25 mcg of levothyroxine with good toleration, follow-up TSH in 3 weeks.  4.  Anemia of chronic disease.  His iron panel showed serum iron 67, TIBC 258, ferritin 56, transferrin 184, transferrin saturation 26.  His hemoglobin remained stable, no indication for PRBC transfusion, follow-up as an outpatient.  5.  Hypokalemia.  Potassium was corrected with potassium chloride.  His discharge potassium is 3.9-3.7.   6.  Hypertension.  His antihypertensive agents were held to prevent hypotension, at home he was taking metoprolol.  Considering his normotension, will avoid beta blockade that can mask hypoglycemic symptoms.  7.  History of alcohol abuse, alcohol induced cirrhosis.  Patient had no withdrawal symptoms during his hospitalization, he was continued on thiamine and multivitamins.  Ultrasonography of the liver  showed small  volume perihepatic ascites and slightly nodular liver contour raising the possibility of cirrhosis.  Gallbladder sludge with probably 11 mm gallstone without evidence of acute cholecystitis.  8.  Left lower extremity edema.  Ultrasonography was negative for negative for deep vein thrombosis.  9.  Unspecified calorie protein malnutrition.  Patient received nutritional supplements, he was evaluated by nutrition.   No signs of deep infection, sepsis was ruled out.  Discharge Diagnoses:  Principal Problem:   Breakthrough seizure [facility]) Active Problems:   Alcohol use   Hypothermia   Edema of left lower extremity   Chronic anemia   Pressure injury of skin    Discharge Instructions   Allergies as of 03/24/2019   No Known Allergies     Medication List    STOP taking these medications   metoprolol tartrate 25 MG tablet Commonly known as:  LOPRESSOR   Santyl ointment Generic drug:  collagenase   Vitamin B1 100 MG Tabs     TAKE these medications   acetaminophen 325 MG tablet Commonly known as:  TYLENOL Take 325 mg by mouth 2 (two) times daily as needed for mild pain.   feeding supplement (ENSURE ENLIVE) Liqd Take 237 mLs by mouth 3 (three) times daily between meals.   folic acid 1 MG tablet Commonly known as:  FOLVITE Take 1 mg by mouth daily.   levETIRAcetam 1000 MG tablet Commonly known as:  KEPPRA Take 1 tablet (1,000 mg total) by mouth 2 (two) times daily.   levothyroxine 25 MCG tablet Commonly known as:  SYNTHROID Take 1 tablet (25 mcg total) by mouth daily at 6 (six) AM for 30 days. Start taking on:  March 25, 2019   multivitamin with minerals Tabs tablet Take 1 tablet by mouth daily for 30 days. Start taking on:  March 25, 2019   tamsulosin 0.4 MG Caps capsule Commonly known as:  FLOMAX Take 0.4 mg by mouth daily.      Contact information for after-discharge care    Destination    [facility] Preferred SNF .   Service:  Skilled  Nursing Contact information: [address] [place] [address] [phone]             No Known Allergies  Consultations:  Tele Neurology    Procedures/Studies: Ct Head Wo Contrast  Result Date: 03/18/2019 CLINICAL DATA:  Found on floor EXAM: CT HEAD WITHOUT CONTRAST CT CERVICAL SPINE WITHOUT CONTRAST TECHNIQUE: Multidetector CT imaging of the head and cervical spine was performed following the standard protocol without intravenous contrast. Multiplanar CT image reconstructions of the cervical spine were also generated. COMPARISON:  CT head 12/04/2018 FINDINGS: CT HEAD FINDINGS Brain: Mild atrophy and mild chronic microvascular ischemic type changes in the white matter. Negative for acute infarct. Negative for hemorrhage or mass. Vascular: Negative for hyperdense vessel Skull: Negative for fracture Sinuses/Orbits: Chronic sinusitis with diffuse mucoperiosteal thickening, left greater than right. Negative orbit Other: None CT CERVICAL SPINE FINDINGS Alignment: Normal Skull base and vertebrae: Negative for fracture Soft tissues and spinal canal: Negative Disc levels: Multilevel disc and facet degeneration. Prominent anterior osteophytes in the cervical spine Upper chest: Negative Other: None IMPRESSION: 1. No acute intracranial abnormality. Mild chronic microvascular ischemic change in the white matter 2. Negative for cervical spine fracture. Electronically Signed   By: Marlan Palau M.D.   On: 03/18/2019 19:37   Ct Cervical Spine Wo Contrast  Result Date: 03/18/2019 CLINICAL DATA:  Found on floor EXAM: CT HEAD  WITHOUT CONTRAST CT CERVICAL SPINE WITHOUT CONTRAST TECHNIQUE: Multidetector CT imaging of the head and cervical spine was performed following the standard protocol without intravenous contrast. Multiplanar CT image reconstructions of the cervical spine were also generated. COMPARISON:  CT head 12/04/2018 FINDINGS: CT HEAD FINDINGS Brain: Mild atrophy and mild chronic  microvascular ischemic type changes in the white matter. Negative for acute infarct. Negative for hemorrhage or mass. Vascular: Negative for hyperdense vessel Skull: Negative for fracture Sinuses/Orbits: Chronic sinusitis with diffuse mucoperiosteal thickening, left greater than right. Negative orbit Other: None CT CERVICAL SPINE FINDINGS Alignment: Normal Skull base and vertebrae: Negative for fracture Soft tissues and spinal canal: Negative Disc levels: Multilevel disc and facet degeneration. Prominent anterior osteophytes in the cervical spine Upper chest: Negative Other: None IMPRESSION: 1. No acute intracranial abnormality. Mild chronic microvascular ischemic change in the white matter 2. Negative for cervical spine fracture. Electronically Signed   By: Marlan Palau M.D.   On: 03/18/2019 19:37   Mr Laqueta Jean WU Contrast  Result Date: 03/19/2019 CLINICAL DATA:  63 year old male with left side weakness, decreased responsiveness, possible unwitnessed seizure. EXAM: MRI HEAD WITHOUT AND WITH CONTRAST TECHNIQUE: Multiplanar, multiecho pulse sequences of the brain and surrounding structures were obtained without and with intravenous contrast. CONTRAST:  10 milliliters Gadavist COMPARISON:  Head CT without contrast earlier today. Brain MRI 11/14/2018. FINDINGS: Brain: No restricted diffusion to suggest acute infarction. No midline shift, mass effect, evidence of mass lesion, ventriculomegaly, extra-axial collection or acute intracranial hemorrhage. Cervicomedullary junction and pituitary are within normal limits. Stable gray-white matter differentiation throughout the brain. Patchy and confluent mostly periventricular cerebral white matter T2 and FLAIR hyperintensity. No cortical encephalomalacia or chronic cerebral blood products identified. Motion degraded postcontrast imaging but no dural thickening or abnormal enhancement of the brain identified. Vascular: Major intracranial vascular flow voids are stable  since 2019. The left vertebral artery is dominant. The major dural venous sinuses are enhancing and appear to be patent. Skull and upper cervical spine: Negative visible cervical spine. Visualized bone marrow signal is within normal limits. Sinuses/Orbits: Left greater than right paranasal sinus disease without improvement since December. Stable orbits. Chronic left orbital floor fracture suspected. Other: Mastoids are clear. Visible internal auditory structures appear normal. Scalp and face soft tissues appear negative. IMPRESSION: 1. No acute intracranial abnormality. Stable MRI appearance of the brain since December. 2. Chronic paranasal sinus disease. Electronically Signed   By: Odessa Fleming M.D.   On: 03/19/2019 10:35   Dg Chest Portable 1 View  Result Date: 03/18/2019 CLINICAL DATA:  Chest rhonchi EXAM: PORTABLE CHEST 1 VIEW COMPARISON:  12/07/2018 FINDINGS: Heart size mildly enlarged without heart failure. Mild bibasilar airspace disease. No effusion. IMPRESSION: Mild bibasilar atelectasis/infiltrate. Electronically Signed   By: Marlan Palau M.D.   On: 03/18/2019 19:30   Dg Swallowing Func-speech Pathology  Result Date: 03/20/2019 Objective Swallowing Evaluation: Type of Study: Bedside Swallow Evaluation  Patient Details Name: Aldine Gornik MRN: [id number] Date of Birth: 04/15/1956 Today's Date: 03/20/2019 Time: SLP Start Time (ACUTE ONLY): 0809 -SLP Stop Time (ACUTE ONLY): 0835 SLP Time Calculation (min) (ACUTE ONLY): 26 min Past Medical History: Past Medical History: Diagnosis Date . Noncompliance with medication regimen  . Seizures (HCC)  Past Surgical History: No past surgical history on file. HPI: 63 yo male adm to [facility] after being found down on floor, pt found to have seizures and was postical.  PMH + for dysphagia, ETOH use, anemia, hypoglycemia, abnormal CXR.  Cervical spine negative, CT  and MRI head negative.  CXR 03/20/2019 showed mild bibasilar ATX, infiltrate.  WBC 3.9 and pt is afebrile.  Pt had  prior MBS in January 2020 with recommendation for puree/thin.   Subjective: pt awake Assessment / Plan / Recommendation CHL IP CLINICAL IMPRESSIONS 03/20/2019 Clinical Impression Pt continues to present with moderate oropharyngeal dysphagia. Various postures including chin tuck, neck extension and head turn left did not improve clearance nor airway protecton.  Oral phase with decreased oral control due to weakess with premature spillage of boluses into pharynx/larynx.  Also laryngeal penetration of thin and nectar apparent due to decreased epiglottic deflection and timing of laryngeal closure.  Mild vallecular residuals present without pt awareness and mix with copious secretions.  Would recommend to consider a dys1/thin diet with strict precautions including dry swallows after solids, sitting fully upright and medications with puree - start and follow with liquids.  Prefer water with meals due to residuals.  Will follow up with pt, RN and MD.  SLP Visit Diagnosis Dysphagia, oropharyngeal phase (R13.12) Attention and concentration deficit following -- Frontal lobe and executive function deficit following -- Impact on safety and function Moderate aspiration risk;Risk for inadequate nutrition/hydration   CHL IP TREATMENT RECOMMENDATION 03/20/2019 Treatment Recommendations Therapy as outlined in treatment plan below   Prognosis 03/20/2019 Prognosis for Safe Diet Advancement Guarded Barriers to Reach Goals -- Barriers/Prognosis Comment -- CHL IP DIET RECOMMENDATION 03/20/2019 SLP Diet Recommendations Dysphagia 1 (Puree) solids;Thin liquid Liquid Administration via Cup;Straw Medication Administration Whole meds with puree Compensations Slow rate;Small sips/bites;Follow solids with liquid;Multiple dry swallows after each bite/sip Postural Changes Remain semi-upright after after feeds/meals (Comment);Seated upright at 90 degrees   CHL IP OTHER RECOMMENDATIONS 03/20/2019 Recommended Consults -- Oral Care Recommendations Oral  care BID Other Recommendations --   CHL IP FOLLOW UP RECOMMENDATIONS 12/10/2018 Follow up Recommendations Skilled Nursing facility   [facility] IP FREQUENCY AND DURATION 03/20/2019 Speech Therapy Frequency (ACUTE ONLY) min 1 x/week Treatment Duration 2 weeks      CHL IP ORAL PHASE 03/20/2019 Oral Phase Impaired Oral - Pudding Teaspoon -- Oral - Pudding Cup -- Oral - Honey Teaspoon -- Oral - Honey Cup -- Oral - Nectar Teaspoon Premature spillage;Delayed oral transit;Reduced posterior propulsion;Weak lingual manipulation Oral - Nectar Cup Premature spillage;Piecemeal swallowing;Weak lingual manipulation Oral - Nectar Straw Premature spillage;Piecemeal swallowing Oral - Thin Teaspoon Premature spillage;Weak lingual manipulation Oral - Thin Cup Premature spillage;Weak lingual manipulation;Piecemeal swallowing Oral - Thin Straw Premature spillage;Delayed oral transit;Reduced posterior propulsion;Weak lingual manipulation;Piecemeal swallowing Oral - Puree Premature spillage;Delayed oral transit;Reduced posterior propulsion;Weak lingual manipulation;Lingual pumping Oral - Mech Soft Premature spillage;Delayed oral transit;Reduced posterior propulsion;Weak lingual manipulation;Lingual pumping Oral - Regular -- Oral - Multi-Consistency -- Oral - Pill -- Oral Phase - Comment --  CHL IP PHARYNGEAL PHASE 03/20/2019 Pharyngeal Phase Impaired Pharyngeal- Pudding Teaspoon -- Pharyngeal -- Pharyngeal- Pudding Cup -- Pharyngeal -- Pharyngeal- Honey Teaspoon -- Pharyngeal -- Pharyngeal- Honey Cup -- Pharyngeal -- Pharyngeal- Nectar Teaspoon Delayed swallow initiation-pyriform sinuses;Reduced epiglottic inversion;Reduced laryngeal elevation;Pharyngeal residue - valleculae;Pharyngeal residue - pyriform Pharyngeal -- Pharyngeal- Nectar Cup Delayed swallow initiation-pyriform sinuses;Reduced laryngeal elevation;Reduced airway/laryngeal closure;Pharyngeal residue - valleculae;Penetration/Aspiration before swallow;Pharyngeal residue - pyriform;Lateral  channel residue Pharyngeal Material enters airway, remains ABOVE vocal cords and not ejected out Pharyngeal- Nectar Straw Delayed swallow initiation-pyriform sinuses;Reduced epiglottic inversion;Penetration/Aspiration during swallow;Pharyngeal residue - valleculae;Pharyngeal residue - pyriform;Lateral channel residue Pharyngeal -- Pharyngeal- Thin Teaspoon Delayed swallow initiation-pyriform sinuses;Pharyngeal residue - valleculae Pharyngeal -- Pharyngeal- Thin Cup Delayed swallow initiation-pyriform sinuses;Reduced epiglottic inversion;Penetration/Aspiration before  swallow Pharyngeal Material enters airway, remains ABOVE vocal cords and not ejected out Pharyngeal- Thin Straw Delayed swallow initiation-pyriform sinuses;Penetration/Aspiration before swallow Pharyngeal Material enters airway, remains ABOVE vocal cords and not ejected out Pharyngeal- Puree Delayed swallow initiation-pyriform sinuses Pharyngeal -- Pharyngeal- Mechanical Soft Delayed swallow initiation-pyriform sinuses;Reduced tongue base retraction;Reduced epiglottic inversion;Pharyngeal residue - valleculae Pharyngeal -- Pharyngeal- Regular -- Pharyngeal -- Pharyngeal- Multi-consistency -- Pharyngeal -- Pharyngeal- Pill -- Pharyngeal -- Pharyngeal Comment --  CHL IP CERVICAL ESOPHAGEAL PHASE 03/20/2019 Cervical Esophageal Phase Impaired Pudding Teaspoon -- Pudding Cup -- Honey Teaspoon -- Honey Cup -- Nectar Teaspoon -- Nectar Cup -- Nectar Straw -- Thin Teaspoon -- Thin Cup -- Thin Straw -- Puree -- Mechanical Soft -- Regular -- Multi-consistency -- Pill -- Cervical Esophageal Comment appearance of delayed clearance distally with retrograde propulsion, radiologist not present to confirm, ? consistent with dysmotility? [name] 03/20/2019, 10:35 AM  Donavan Burnet, MS [facility] SLP Acute Rehab Services Pager [phone] Office [phone]             Ct Head Code Stroke Wo Contrast  Result Date: 03/19/2019 CLINICAL DATA:  Code stroke.   Left-sided weakness and facial droop. EXAM: CT HEAD WITHOUT CONTRAST TECHNIQUE: Contiguous axial images were obtained from the base of the skull through the vertex without intravenous contrast. COMPARISON:  03/18/2019 FINDINGS: Brain: There is no evidence of acute infarct, intracranial hemorrhage, mass, midline shift, or extra-axial fluid collection. There is mild cerebral atrophy. Patchy cerebral white matter hypodensities are unchanged and nonspecific but compatible with mild chronic small vessel ischemic disease. Vascular: No hyperdense vessel. Skull: No acute fracture or focal osseous lesion. Sinuses/Orbits: Chronic sinusitis with persistent complete opacification of the left frontal and left maxillary sinuses and near complete left ethmoid air cell opacification. Milder mucosal thickening in the right frontal, right maxillary, and bilateral sphenoid sinuses. Clear mastoid air cells. Unremarkable orbits. Other: None. ASPECTS [facility] Stroke Program Early CT Score) - Ganglionic level infarction (caudate, lentiform nuclei, internal capsule, insula, M1-M3 cortex): 7 - Supraganglionic infarction (M4-M6 cortex): 3 Total score (0-10 with 10 being normal): 10 IMPRESSION: 1. No evidence of acute intracranial abnormality. 2. ASPECTS is 10. 3. Mild chronic small vessel ischemic disease. These results were called by telephone at the time of interpretation on 03/19/2019 at 9:05 am to Dr. Erin Hearing , who verbally acknowledged these results. Electronically Signed   By: Sebastian Ache M.D.   On: 03/19/2019 09:07   Vas [place] Lower Extremity Venous (dvt)  Result Date: 03/20/2019  Lower Venous Study Indications: Swelling.  Performing Technologist: Gertie Fey MHA, RDMS, RVT, RDCS  Examination Guidelines: A complete evaluation includes B-mode imaging, spectral Doppler, color Doppler, and power Doppler as needed of all accessible portions of each vessel. Bilateral testing is considered an integral part of a complete  examination. Limited examinations for reoccurring indications may be performed as noted.  +-----+---------------+---------+-----------+----------+--------------+ RIGHTCompressibilityPhasicitySpontaneityPropertiesSummary        +-----+---------------+---------+-----------+----------+--------------+ CFV                                               Not visualized +-----+---------------+---------+-----------+----------+--------------+   +---------+---------------+---------+-----------+----------+-------+ LEFT     CompressibilityPhasicitySpontaneityPropertiesSummary +---------+---------------+---------+-----------+----------+-------+ CFV      Full           Yes      Yes                          +---------+---------------+---------+-----------+----------+-------+  SFJ      Full                                                 +---------+---------------+---------+-----------+----------+-------+ FV Prox  Full                                                 +---------+---------------+---------+-----------+----------+-------+ FV Mid   Full                                                 +---------+---------------+---------+-----------+----------+-------+ FV DistalFull                                                 +---------+---------------+---------+-----------+----------+-------+ PFV      Full                                                 +---------+---------------+---------+-----------+----------+-------+ POP      Full           Yes      Yes                          +---------+---------------+---------+-----------+----------+-------+ PTV      Full                                                 +---------+---------------+---------+-----------+----------+-------+ PERO     Full                                                 +---------+---------------+---------+-----------+----------+-------+     Summary: Left: There is no evidence of deep  vein thrombosis in the lower extremity. No cystic structure found in the popliteal fossa.  *See table(s) above for measurements and observations. Electronically signed by Lemar Livings MD on 03/20/2019 at 5:11:29 PM.    Final    US Abdomen Limited Ruq  Result Date: 03/19/2019 CLINICAL DATA:  64 year old male with hypoglycemia, possible chronic alcohol abuse. EXAM: ULTRASOUND ABDOMEN LIMITED RIGHT UPPER QUADRANT COMPARISON:  No prior abdominal imaging. FINDINGS: Gallbladder: Dependent sludge. Nonshadowing 11 millimeter gallstone suspected in the gallbladder neck (images 4 and 41). Gallbladder wall thickness remains normal. No pericholecystic fluid. No sonographic Murphy sign elicited. Common bile duct: Diameter: Could not be visualized. Liver: Slightly in nodular liver contour (image 33). Liver echogenicity at the upper limits of normal. Mildly coarse echotexture. No discrete liver lesion. No intrahepatic biliary ductal dilatation identified. Portal vein is patent on color Doppler imaging with normal direction of blood flow towards the liver.  Other findings: Small volume perihepatic ascites. Negative visible right kidney. IMPRESSION: 1. Small volume perihepatic ascites and slightly nodular liver contour raising the possibility of cirrhosis. 2. Gallbladder sludge and probable 11 mm gallstone without evidence of acute cholecystitis. 3. CBD could not be visualized, but there is no intrahepatic biliary ductal dilatation to suggest bile duct obstruction. Electronically Signed   By: Odessa Fleming M.D.   On: 03/19/2019 12:01      Procedures:   Subjective: Patient is feeling well, tolerating po well, no nausea or vomiting.   Discharge Exam: Vitals:   03/23/19 2125 03/24/19 0528  BP: (!) 103/57 121/68  Pulse: 74 67  Resp: 16 16  Temp: 98.5 F (36.9 C) 98.8 F (37.1 C)  SpO2: 98% 95%   Vitals:   03/23/19 0118 03/23/19 1300 03/23/19 2125 03/24/19 0528  BP: 116/60  (!) 103/57 121/68  Pulse: 79 84 74 67   Resp: 14 14 16 16   Temp: 98.1 F (36.7 C) 99.2 F (37.3 C) 98.5 F (36.9 C) 98.8 F (37.1 C)  TempSrc: Oral Axillary Oral Oral  SpO2: 97% 95% 98% 95%  Weight:      Height:        General: Not in pain or dyspnea  Neurology: Awake and alert, positive left upper extremity weakness, 2/5 proximal with appropriate hand grip.  E ENT: no pallor, no icterus, oral mucosa moist Cardiovascular: No JVD. S1-S2 present, rhythmic, no gallops, rubs, or murmurs. No lower extremity edema. Pulmonary:  positive breath sounds bilaterally, adequate air movement, no wheezing, rhonchi or rales. Gastrointestinal. Abdomen with no organomegaly, non tender, no rebound or guarding Skin. No rashes Musculoskeletal: no joint deformities   The results of significant diagnostics from this hospitalization (including imaging, microbiology, ancillary and laboratory) are listed below for reference.     Microbiology: Recent Results (from the past 240 hour(s))  SARS Coronavirus 2 [facility] order, Performed in [facility] hospital lab)     Status: None   Collection Time: 03/18/19  7:58 PM  Result Value Ref Range Status   SARS Coronavirus 2 NEGATIVE NEGATIVE Final    Comment: (NOTE) If result is NEGATIVE SARS-CoV-2 target nucleic acids are NOT DETECTED. The SARS-CoV-2 RNA is generally detectable in upper and lower  respiratory specimens during the acute phase of infection. The lowest  concentration of SARS-CoV-2 viral copies this assay can detect is 250  copies / mL. A negative result does not preclude SARS-CoV-2 infection  and should not be used as the sole basis for treatment or other  patient management decisions.  A negative result may occur with  improper specimen collection / handling, submission of specimen other  than nasopharyngeal swab, presence of viral mutation(s) within the  areas targeted by this assay, and inadequate number of viral copies  (<250 copies / mL). A negative result must be combined with  clinical  observations, patient history, and epidemiological information. If result is POSITIVE SARS-CoV-2 target nucleic acids are DETECTED. The SARS-CoV-2 RNA is generally detectable in upper and lower  respiratory specimens dur ing the acute phase of infection.  Positive  results are indicative of active infection with SARS-CoV-2.  Clinical  correlation with patient history and other diagnostic information is  necessary to determine patient infection status.  Positive results do  not rule out bacterial infection or co-infection with other viruses. If result is PRESUMPTIVE POSTIVE SARS-CoV-2 nucleic acids MAY BE PRESENT.   A presumptive positive result was obtained on the submitted specimen  and confirmed  on repeat testing.  While 2019 novel coronavirus  (SARS-CoV-2) nucleic acids may be present in the submitted sample  additional confirmatory testing may be necessary for epidemiological  and / or clinical management purposes  to differentiate between  SARS-CoV-2 and other Sarbecovirus currently known to infect humans.  If clinically indicated additional testing with an alternate test  methodology [phone]) is advised. The SARS-CoV-2 RNA is generally  detectable in upper and lower respiratory sp ecimens during the acute  phase of infection. The expected result is Negative. Fact Sheet for Patients:  BoilerBrush.com.cy Fact Sheet for Healthcare Providers: https://pope.com/ This test is not yet approved or cleared by the [place] FDA and has been authorized for detection and/or diagnosis of SARS-CoV-2 by FDA under an Emergency Use Authorization (EUA).  This EUA will remain in effect (meaning this test can be used) for the duration of the COVID-19 declaration under Section 564(b)(1) of the Act, 21 U.S.C. section 360bbb-3(b)(1), unless the authorization is terminated or revoked sooner. Performed at [facility]  [address], [place], [place] [address]   MRSA PCR Screening     Status: None   Collection Time: 03/18/19 11:46 PM  Result Value Ref Range Status   MRSA by PCR NEGATIVE NEGATIVE Final    Comment:        The GeneXpert MRSA Assay (FDA approved for NASAL specimens only), is one component of a comprehensive MRSA colonization surveillance program. It is not intended to diagnose MRSA infection nor to guide or monitor treatment for MRSA infections. Performed at [facility], [address], [place], [place] [address]      Labs: BNP (last 3 results) No results for input(s): BNP in the last 8760 hours. Basic Metabolic Panel: Recent Labs  Lab 03/18/19 1932 03/18/19 2328 03/20/19 0321 03/21/19 0349 03/22/19 0446 03/23/19 0413  NA 139  --  135 139 139 137  K 3.7  --  3.3* 3.9 3.9 3.7  CL 107  --  104 108 108 105  CO2 24  --  27 25 26 26   GLUCOSE 77  --  479* 77 75 81  BUN 11  --  8 10 11 13   CREATININE 0.75  --  0.91 0.83 0.76 0.76  CALCIUM 8.7*  --  8.3* 8.7* 8.7* 8.6*  MG  --  1.9  --   --   --   --    Liver Function Tests: Recent Labs  Lab 03/18/19 1932  AST 36  ALT 28  ALKPHOS 80  BILITOT 0.2*  PROT 6.9  ALBUMIN 3.0*   No results for input(s): LIPASE, AMYLASE in the last 168 hours. No results for input(s): AMMONIA in the last 168 hours. CBC: Recent Labs  Lab 03/18/19 1932 03/20/19 0321  WBC 3.9* 5.6  NEUTROABS 2.5 3.8  HGB 9.3* 7.9*  HCT 29.8* 26.4*  MCV 88.2 91.7  PLT 146* 108*   Cardiac Enzymes: Recent Labs  Lab 03/18/19 1932 03/18/19 2328  CKTOTAL  --  154  TROPONINI <0.03  --    BNP: Invalid input(s): POCBNP CBG: Recent Labs  Lab 03/22/19 2047 03/23/19 0717 03/23/19 1215 03/23/19 1635 03/24/19 0644  GLUCAP 83 79 76 79 87   D-Dimer No results for input(s): DDIMER in the last 72 hours. Hgb A1c No results for input(s): HGBA1C in the last 72 hours. Lipid Profile No results for input(s): CHOL, HDL, LDLCALC,  TRIG, CHOLHDL, LDLDIRECT in the last 72 hours. Thyroid function studies No results  for input(s): TSH, T4TOTAL, T3FREE, THYROIDAB in the last 72 hours.  Invalid input(s): FREET3 Anemia work up Recent Labs    03/22/19 0446  FERRITIN 56  TIBC 258  IRON 67   Urinalysis    Component Value Date/Time   COLORURINE YELLOW 03/18/2019 2122   APPEARANCEUR CLEAR 03/18/2019 2122   LABSPEC 1.008 03/18/2019 2122   PHURINE 5.0 03/18/2019 2122   GLUCOSEU NEGATIVE 03/18/2019 2122   HGBUR NEGATIVE 03/18/2019 2122   BILIRUBINUR NEGATIVE 03/18/2019 2122   KETONESUR NEGATIVE 03/18/2019 2122   PROTEINUR NEGATIVE 03/18/2019 2122   UROBILINOGEN 1.0 04/04/2012 1449   NITRITE NEGATIVE 03/18/2019 2122   LEUKOCYTESUR NEGATIVE 03/18/2019 2122   Sepsis Labs Invalid input(s): PROCALCITONIN,  WBC,  LACTICIDVEN Microbiology Recent Results (from the past 240 hour(s))  SARS Coronavirus 2 [facility] order, Performed in [facility] hospital lab)     Status: None   Collection Time: 03/18/19  7:58 PM  Result Value Ref Range Status   SARS Coronavirus 2 NEGATIVE NEGATIVE Final    Comment: (NOTE) If result is NEGATIVE SARS-CoV-2 target nucleic acids are NOT DETECTED. The SARS-CoV-2 RNA is generally detectable in upper and lower  respiratory specimens during the acute phase of infection. The lowest  concentration of SARS-CoV-2 viral copies this assay can detect is 250  copies / mL. A negative result does not preclude SARS-CoV-2 infection  and should not be used as the sole basis for treatment or other  patient management decisions.  A negative result may occur with  improper specimen collection / handling, submission of specimen other  than nasopharyngeal swab, presence of viral mutation(s) within the  areas targeted by this assay, and inadequate number of viral copies  (<250 copies / mL). A negative result must be combined with clinical  observations, patient history, and epidemiological information. If result  is POSITIVE SARS-CoV-2 target nucleic acids are DETECTED. The SARS-CoV-2 RNA is generally detectable in upper and lower  respiratory specimens dur ing the acute phase of infection.  Positive  results are indicative of active infection with SARS-CoV-2.  Clinical  correlation with patient history and other diagnostic information is  necessary to determine patient infection status.  Positive results do  not rule out bacterial infection or co-infection with other viruses. If result is PRESUMPTIVE POSTIVE SARS-CoV-2 nucleic acids MAY BE PRESENT.   A presumptive positive result was obtained on the submitted specimen  and confirmed on repeat testing.  While 2019 novel coronavirus  (SARS-CoV-2) nucleic acids may be present in the submitted sample  additional confirmatory testing may be necessary for epidemiological  and / or clinical management purposes  to differentiate between  SARS-CoV-2 and other Sarbecovirus currently known to infect humans.  If clinically indicated additional testing with an alternate test  methodology [phone]) is advised. The SARS-CoV-2 RNA is generally  detectable in upper and lower respiratory sp ecimens during the acute  phase of infection. The expected result is Negative. Fact Sheet for Patients:  BoilerBrush.com.cy Fact Sheet for Healthcare Providers: https://pope.com/ This test is not yet approved or cleared by the [place] FDA and has been authorized for detection and/or diagnosis of SARS-CoV-2 by FDA under an Emergency Use Authorization (EUA).  This EUA will remain in effect (meaning this test can be used) for the duration of the COVID-19 declaration under Section 564(b)(1) of the Act, 21 U.S.C. section 360bbb-3(b)(1), unless the authorization is terminated or revoked sooner. Performed at [facility], [address], [place], [place] [address]  MRSA PCR Screening     Status: None    Collection Time: 03/18/19 11:46 PM  Result Value Ref Range Status   MRSA by PCR NEGATIVE NEGATIVE Final    Comment:        The GeneXpert MRSA Assay (FDA approved for NASAL specimens only), is one component of a comprehensive MRSA colonization surveillance program. It is not intended to diagnose MRSA infection nor to guide or monitor treatment for MRSA infections. Performed at [facility], [address] [place], [place] [address]      Time coordinating discharge: 45 minutes  SIGNED:   Coralie Keens, MD  [facility] 03/24/2019, 11:51 AM

## 2019-03-24 NOTE — Progress Notes (Signed)
Hypoglycemic Event  CBG: 66  Treatment: 4 oz juice/soda  Symptoms: None  Follow-up CBG: Time:1745 CBG Result:81  Possible Reasons for Event: Unknown  Comments/MD notified:Dr Arrien notified.      Benen Weida, Jonelle Sidle

## 2019-03-24 NOTE — Progress Notes (Signed)
Physical Therapy Treatment Patient Details Name: Mason Hart MRN: [id number] DOB: [birth year] Today's Date: 03/24/2019    History of Present Illness 63 year old male who presented with altered mental status and uncontrolled seizure.  Past medical history of seizures, medical noncompliance, hypoglycemia, hypokalemia, L clavicle fx    PT Comments    Pt assisted OOB to recliner and mobility improved once sitting EOB.  Pt requiring min assist for standing and pivoting to recliner.  Incontinent of BM with mobility however.  Continue to recommend SNF upon d/c.    Follow Up Recommendations  SNF     Equipment Recommendations  None recommended by PT    Recommendations for Other Services       Precautions / Restrictions Precautions Precautions: Fall Precaution Comments: R hemiparesis     Mobility  Bed Mobility Overal bed mobility: Needs Assistance Bed Mobility: Supine to Sit     Supine to sit: Mod assist     General bed mobility comments: makes little initiative however starts to assist as assisted. utilized bed pad for positioning  Transfers Overall transfer level: Needs assistance Equipment used: Rolling walker (2 wheeled) Transfers: Sit to/from [employer] Sit to Stand: [employer] safety/equipment Stand pivot transfers: Min assist;+2 safety/equipment       General transfer comment: increased time required however pt able to assist with UEs and hold RW, cues for weight shifting and posture, pt reported need to urinate with standing (condom cath) however started having loose BM, pt declined further need to have BM and then explosive BM onto recliner thereafter, pt assisted to standing from recliner for further pericare (pt left on clean pads, clean socks applied, and changed gown)  Ambulation/Gait                 Stairs             Wheelchair Mobility    Modified Rankin (Stroke Patients Only)       Balance                                             Cognition Arousal/Alertness: Awake/alert Behavior During Therapy: WFL for tasks assessed/performed Overall Cognitive Status: No family/caregiver present to determine baseline cognitive functioning                                 General Comments: more alert today compared to last PT session, follows one step commands      Exercises      General Comments        Pertinent Vitals/Pain Pain Assessment: No/denies pain    Home Living                      Prior Function            PT Goals (current goals can now be found in the care plan section) Progress towards PT goals: Progressing toward goals    Frequency    Min 3X/week      PT Plan Current plan remains appropriate    Co-evaluation              AM-PAC PT "6 Clicks" Mobility   Outcome Measure  Help needed turning from your back to your side while in a flat bed without using bedrails?: A Lot Help needed  moving from lying on your back to sitting on the side of a flat bed without using bedrails?: A Lot Help needed moving to and from a bed to a chair (including a wheelchair)?: A Little Help needed standing up from a chair using your arms (e.g., wheelchair or bedside chair)?: A Little Help needed to walk in hospital room?: A Lot Help needed climbing 3-5 steps with a railing? : Total 6 Click Score: 13    End of Session Equipment Utilized During Treatment: Gait belt Activity Tolerance: Patient tolerated treatment well Patient left: in chair;with chair alarm set;with call bell/phone within reach Nurse Communication: Mobility status PT Visit Diagnosis: Other abnormalities of gait and mobility (R26.89)     Time: 1037-1100 PT Time Calculation (min) (ACUTE ONLY): 23 min  Charges:  $Therapeutic Activity: 23-37 mins                     Zenovia Jarred, PT, DPT Acute Rehabilitation Services Office: [phone] Pager: [phone]  Sarajane Jews 03/24/2019, 1:33 PM

## 2019-03-25 LAB — GLUCOSE, CAPILLARY
Glucose-Capillary: 140 mg/dL — ABNORMAL HIGH (ref 70–99)
Glucose-Capillary: 45 mg/dL — ABNORMAL LOW (ref 70–99)
Glucose-Capillary: 47 mg/dL — ABNORMAL LOW (ref 70–99)
Glucose-Capillary: 59 mg/dL — ABNORMAL LOW (ref 70–99)
Glucose-Capillary: 66 mg/dL — ABNORMAL LOW (ref 70–99)
Glucose-Capillary: 69 mg/dL — ABNORMAL LOW (ref 70–99)
Glucose-Capillary: 73 mg/dL (ref 70–99)
Glucose-Capillary: 75 mg/dL (ref 70–99)
Glucose-Capillary: 96 mg/dL (ref 70–99)

## 2019-03-25 NOTE — Progress Notes (Signed)
Patient this morning remains asymptomatic, no new complaints, he has been tolerating p.o. diet adequately, no nausea, no vomiting no diarrhea.  On his physical examination is unchanged, chronic left upper extremity weakness.    His capillary glucose has been 81, 69, 96, 59, 75, 73 over the last 12 hours.   Will proceed to discharge patient to skilled nursing facility continue close capillary glucose monitoring.

## 2019-03-25 NOTE — Progress Notes (Signed)
CBG 69 at 00:56.   4 oz orange juice given at 1 am.  Re-check due at 1:15 am.

## 2019-03-25 NOTE — Progress Notes (Signed)
Patient discharged via PTAR to [facility].  All patient belongings went with PTAR.    RN called to give report to [facility] at [facility].

## 2019-03-25 NOTE — TOC Transition Note (Signed)
Transition of Care [facility]) - CM/SW Discharge Note   Patient Details  Name: Mason Hart MRN: [id number] Date of Birth: [birth year]  [facility] of [facility] CM/SW Contact:  Althea Charon, LCSW Phone Number: 03/25/2019, 11:50 AM   Clinical Narrative:   Patient to go to [facility] via ptar. RN to call [phone] (rm# [phone]) for report   Final next level of care: Skilled Nursing Facility Barriers to Discharge: No Barriers Identified   Patient Goals and CMS Choice Patient states their goals for this hospitalization and ongoing recovery are:: return home CMS Medicare.gov Compare Post Acute Care list provided to:: Other (Comment Required)(Niece) Choice offered to / list presented to : (Niece)  Discharge Placement PASRR number recieved: 03/24/19            Patient chooses bed at: [facility] Patient to be transferred to facility by: ptar Name of family member notified: niece Patient and family notified of of transfer: 03/24/19  Discharge Plan and Services In-house Referral: NA Discharge Planning Services: NA Post Acute Care Choice: Skilled Nursing Facility          DME Arranged: N/A DME Agency: NA       HH Arranged: NA HH Agency: NA        Social Determinants of Health (SDOH) Interventions     Readmission Risk Interventions Readmission Risk Prevention Plan 03/23/2019  Transportation Screening Complete  PCP or Specialist Appt within 5-7 Days Not Complete  Home Care Screening Complete  Medication Review (RN CM) Not Complete  Some recent data might be hidden

## 2019-03-25 NOTE — Progress Notes (Signed)
Occupational Therapy Treatment Patient Details Name: Mason Hart MRN: [id number] DOB: [birth year] Today's Date: 03/25/2019    History of present illness 63 year old male who presented with altered mental status and uncontrolled seizure.  Past medical history of seizures, medical noncompliance, hypoglycemia, hypokalemia, L clavicle fx   OT comments  Worked on bathing; SPT to chair.  Pt's catheter had leaked and bed was saturated:  Pt did not let anyone know Also performed 5 reps of AAROM to bil shoulders  Follow Up Recommendations  SNF    Equipment Recommendations  3 in 1 bedside commode    Recommendations for Other Services      Precautions / Restrictions Precautions Precautions: Fall Precaution Comments: R hemiparesis  Restrictions Weight Bearing Restrictions: No       Mobility Bed Mobility                  Transfers   Equipment used: Rolling walker (2 wheeled)   Sit to Stand: Mod assist Stand pivot transfers: Mod assist       General transfer comment: assist to power up and stabilize. Asssist to turn RW and complete pivot safely. Cues for UE placement    Balance                                           ADL either performed or assessed with clinical judgement   ADL           Upper Body Bathing: Minimal assistance     Lower Body Bathing Details (indicate cue type and reason): Mod A sit<>stand from raised surface Upper Body Dressing : Moderate assistance       Toilet Transfer: Moderate assistance;Stand-pivot;RW(recliner) Toilet Transfer Details (indicate cue type and reason): would be better with +2 safety Toileting- Clothing Manipulation and Hygiene: Total assistance         General ADL Comments: transferred to recliner:  pt assisted with bathing.  5 reps FF to bil shoulders, without c/o pain     Vision       Perception     Praxis      Cognition Arousal/Alertness: Awake/alert Behavior During Therapy: WFL for  tasks assessed/performed Overall Cognitive Status: No family/caregiver present to determine baseline cognitive functioning                                 General Comments: pt was in saturated bed and never called for assistance        Exercises     Shoulder Instructions       General Comments      Pertinent Vitals/ Pain       Pain Assessment: No/denies pain  Home Living                                          Prior Functioning/Environment              Frequency           Progress Toward Goals  OT Goals(current goals can now be found in the care plan section)  Progress towards OT goals: Progressing toward goals     Plan      Co-evaluation  AM-PAC OT "6 Clicks" Daily Activity     Outcome Measure   Help from another person eating meals?: A Little Help from another person taking care of personal grooming?: A Little Help from another person toileting, which includes using toliet, bedpan, or urinal?: A Lot Help from another person bathing (including washing, rinsing, drying)?: A Lot Help from another person to put on and taking off regular upper body clothing?: A Lot Help from another person to put on and taking off regular lower body clothing?: Total 6 Click Score: 13    End of Session    OT Visit Diagnosis: Unsteadiness on feet (R26.81);Other abnormalities of gait and mobility (R26.89);History of falling (Z91.81);Other symptoms and signs involving cognitive function;Hemiplegia and hemiparesis Hemiplegia - Right/Left: Right Hemiplegia - dominant/non-dominant: Dominant   Activity Tolerance Patient tolerated treatment well   Patient Left in bed;with call bell/phone within reach;with bed alarm set   Nurse Communication          Time: 4696-2952 OT Time Calculation (min): 22 min  Charges: OT General Charges $OT Visit: 1 Visit OT Treatments $Self Care/Home Management : 8-22 mins  Mason Hart, OTR/L Acute Rehabilitation Services [phone] [facility] pager [phone] office 03/25/2019   Mason Hart 03/25/2019, 11:44 AM

## 2019-04-23 ENCOUNTER — Telehealth: Payer: Self-pay | Admitting: Family Medicine

## 2019-04-23 NOTE — Telephone Encounter (Signed)
He has not been seen in the clinic since 07/2017 and will need an office visit prior to orders being approved.

## 2019-04-23 NOTE — Telephone Encounter (Signed)
Patient will make an appointment. Call was transferred to Lily Kocher for appointment availability.

## 2019-04-23 NOTE — Telephone Encounter (Signed)
Mason Hart from [facility] called requesting verbal orders for OT please follow up

## 2019-04-23 NOTE — Telephone Encounter (Signed)
Mason Hart called to follow up on the verbal order for OT please follow up

## 2019-05-07 ENCOUNTER — Other Ambulatory Visit: Payer: Self-pay

## 2019-05-07 ENCOUNTER — Ambulatory Visit: Payer: BC Managed Care – PPO | Attending: Nurse Practitioner | Admitting: Nurse Practitioner

## 2019-05-08 NOTE — Telephone Encounter (Signed)
Follow up   Mason Hart from [facility] is calling to check on the orders for the pt since the appt has passed. Please see previous notes in epic and f/u

## 2019-05-11 NOTE — Telephone Encounter (Signed)
Needs appt

## 2019-06-04 ENCOUNTER — Other Ambulatory Visit: Payer: Self-pay

## 2019-06-04 ENCOUNTER — Encounter: Payer: Self-pay | Admitting: Nurse Practitioner

## 2019-06-04 ENCOUNTER — Ambulatory Visit: Payer: BC Managed Care – PPO | Attending: Nurse Practitioner | Admitting: Nurse Practitioner

## 2019-06-04 DIAGNOSIS — F10921 Alcohol use, unspecified with intoxication delirium: Secondary | ICD-10-CM | POA: Diagnosis not present

## 2019-06-04 DIAGNOSIS — F1010 Alcohol abuse, uncomplicated: Secondary | ICD-10-CM

## 2019-06-04 DIAGNOSIS — G40909 Epilepsy, unspecified, not intractable, without status epilepticus: Secondary | ICD-10-CM | POA: Diagnosis not present

## 2019-06-04 DIAGNOSIS — E039 Hypothyroidism, unspecified: Secondary | ICD-10-CM

## 2019-06-04 DIAGNOSIS — E785 Hyperlipidemia, unspecified: Secondary | ICD-10-CM | POA: Diagnosis not present

## 2019-06-04 MED ORDER — LEVOTHYROXINE SODIUM 25 MCG PO TABS: 25.0000 ug | ORAL_TABLET | Freq: Every day | ORAL | 0 refills | Status: DC

## 2019-06-04 MED ORDER — TAMSULOSIN HCL 0.4 MG PO CAPS: 0.4000 mg | ORAL_CAPSULE | Freq: Every day | ORAL | 1 refills | Status: AC

## 2019-06-04 MED ORDER — LEVETIRACETAM 1000 MG PO TABS: 1000.0000 mg | ORAL_TABLET | Freq: Two times a day (BID) | ORAL | 2 refills | Status: DC

## 2019-06-04 MED ORDER — FOLIC ACID 1 MG PO TABS: 1.0000 mg | ORAL_TABLET | Freq: Every day | ORAL | 3 refills | Status: DC

## 2019-06-04 MED ORDER — VITAMIN B-1 100 MG PO TABS: 100.0000 mg | ORAL_TABLET | Freq: Every day | ORAL | 2 refills | Status: AC

## 2019-06-04 NOTE — Progress Notes (Signed)
Virtual Visit via Telephone Note Due to national recommendations of social distancing due to [place] 19, telehealth visit is felt to be most appropriate for this patient at this time.  I discussed the limitations, risks, security and privacy concerns of performing an evaluation and management service by telephone and the availability of in person appointments. I also discussed with the patient that there may be a patient responsible charge related to this service. The patient expressed understanding and agreed to proceed.    I connected with Mare Ferrari on 06/04/19  at   2:50 PM EDT  EDT by telephone and verified that I am speaking with the correct person using two identifiers.   Consent I discussed the limitations, risks, security and privacy concerns of performing an evaluation and management service by telephone and the availability of in person appointments. I also discussed with the patient that there may be a patient responsible charge related to this service. The patient expressed understanding and agreed to proceed.   Location of Patient: Private Residence   Location of Provider: [place] [facility] [employer] Office    Persons participating in Telemedicine visit: Geryl Rankins [name]-BC [place] Durable POA   History of Present Illness: Telemedicine visit for: Establish Care  has a past medical history of Alcohol abuse, Hypothyroidism, Noncompliance with medication regimen, and Seizures ([place]).   Seizure Chronic and ongoing for several years with most recent hospital admission on 03-18-2019. Seizures likely related to alcohol abuse. He continues to drink alcohol. We had a long discussion regarding his drinking habits and complications that can occur including liver cirrhosis, vascular dementia and death.  last He is noncompliant with taking his KEPPRA. Still has April and May pill bottles at home. He has been on Dilantin in the past.  Today he  denies any seizure activity since his last ED visit. He has driving restrictions and will need to be evaluated by Neurology prior to any decision being made regarding driving.  Last EEG 03-19-2019 IMPRESSION: This EEG is characterized by slowing which is consistent with a general cerebral disturbance such as a metabolic encephalopathy or postictal state but can not rule out the possibility of normal drowse.  Clinical correlation recommended.  No epileptiform activity is noted.     Hypothyroidism Not taking synthroid as prescribed. Will need to repeat TSH. He denies fatigue, weight changes, heat/cold intolerance, bowel/skin changes or CVS symptoms.  Previous thyroid studies include TSH, Free T3/T4. Lab Results  Component Value Date   TSH 5.010 (H) 03/18/2019   Dys Past Medical History:  Diagnosis Date  . Alcohol abuse   . Hypothyroidism   . Hypothyroidism   . Noncompliance with medication regimen   . Seizures ([place])     Past Surgical History:  Procedure Laterality Date  . NO PAST SURGERIES      Family History  Problem Relation Age of Onset  . Heart disease Mother   . Cancer Neg Hx   . Diabetes Neg Hx   . Stroke Neg Hx     Social History   Socioeconomic History  . Marital status: Married    Spouse name: Not on file  . Number of children: Not on file  . Years of education: Not on file  . Highest education level: Not on file  Occupational History  . Not on file  Social Needs  . Financial resource strain: Not on file  . Food insecurity    Worry: Not on file  Inability: Not on file  . Transportation needs    Medical: Not on file    Non-medical: Not on file  Tobacco Use  . Smoking status: Never Smoker  . Smokeless tobacco: Never Used  Substance and Sexual Activity  . Alcohol use: Yes    Alcohol/week: 7.0 standard drinks    Types: 7 Cans of beer per week    Comment: Once a week  . Drug use: No  . Sexual activity: Not Currently  Lifestyle  . Physical activity     Days per week: Not on file    Minutes per session: Not on file  . Stress: Not on file  Relationships  . Social Herbalist on phone: Not on file    Gets together: Not on file    Attends religious service: Not on file    Active member of club or organization: Not on file    Attends meetings of clubs or organizations: Not on file    Relationship status: Not on file  Other Topics Concern  . Not on file  Social History Narrative   Works as Retail buyer.   Married, no children     Observations/Objective: Awake, alert and oriented x 3   Review of Systems  Constitutional: Negative for fever, malaise/fatigue and weight loss.  HENT: Negative.  Negative for nosebleeds.   Eyes: Negative.  Negative for blurred vision, double vision and photophobia.  Respiratory: Negative.  Negative for cough and shortness of breath.   Cardiovascular: Negative.  Negative for chest pain, palpitations and leg swelling.  Gastrointestinal: Negative.  Negative for heartburn, nausea and vomiting.  Musculoskeletal: Negative.  Negative for myalgias.  Neurological: Positive for seizures. Negative for dizziness, focal weakness and headaches.  Psychiatric/Behavioral: Positive for substance abuse (ETOH). Negative for suicidal ideas.    Assessment and Plan:  Diagnoses and all orders for this visit:  Seizure disorder ([place]) -     Ambulatory referral to Neurology -     levETIRAcetam (KEPPRA) 1000 MG tablet; Take 1 tablet (1,000 mg total) by mouth 2 (two) times daily.  Hypothyroidism, unspecified type -     levothyroxine (SYNTHROID) 25 MCG tablet; Take 1 tablet (25 mcg total) by mouth daily at 6 (six) AM. -     TSH; Future -     T4, free; Future -     T3; Future  Alcohol abuse -     folic acid (FOLVITE) 1 MG tablet; Take 1 tablet (1 mg total) by mouth daily. -     thiamine (VITAMIN B-1) 100 MG tablet; Take 1 tablet (100 mg total) by mouth daily. -     CBC; Future -     CMP14+EGFR; Future Patient advised to  stop drinking. We discussed cirrhosis and complications of alcohol abuse.   Dyslipidemia -     Lipid panel; Future INSTRUCTIONS: Work on a low fat, heart healthy diet and participate in regular aerobic exercise program by working out at least 150 minutes per week; 5 days a week-30 minutes per day. Avoid red meat, fried foods. junk foods, sodas, sugary drinks, unhealthy snacking, alcohol and smoking.  Drink at least 48oz of water per day and monitor your carbohydrate intake daily.  Lab Results  Component Value Date   LDLCALC 73 08/09/2017    Alcohol intoxication with delirium ([place]) RESOLVED  Other orders -     tamsulosin (FLOMAX) 0.4 MG CAPS capsule; Take 1 capsule (0.4 mg total) by mouth daily.     Follow  Up Instructions Return in about 6 weeks (around 07/16/2019).     I discussed the assessment and treatment plan with the patient. The patient was provided an opportunity to ask questions and all were answered. The patient agreed with the plan and demonstrated an understanding of the instructions.   The patient was advised to call back or seek an in-person evaluation if the symptoms worsen or if the condition fails to improve as anticipated.  I provided 24 minutes of non-face-to-face time during this encounter including median intraservice time, reviewing previous notes, labs, imaging, medications and explaining diagnosis and management.  Gildardo Pounds, [name]-BC

## 2019-06-11 ENCOUNTER — Telehealth: Payer: Self-pay | Admitting: Nurse Practitioner

## 2019-06-11 NOTE — Telephone Encounter (Signed)
Shaunda from [facility] called to request verbal orders for physical therapy  -Once a week for 5 weeks  -[phone] p

## 2019-06-11 NOTE — Telephone Encounter (Signed)
CMA spoke to [place] and inform PCP approve the verbal orders.

## 2019-06-25 ENCOUNTER — Ambulatory Visit: Payer: BC Managed Care – PPO | Attending: Family Medicine

## 2019-06-25 ENCOUNTER — Other Ambulatory Visit: Payer: Self-pay

## 2019-06-25 DIAGNOSIS — F1010 Alcohol abuse, uncomplicated: Secondary | ICD-10-CM

## 2019-06-25 DIAGNOSIS — E039 Hypothyroidism, unspecified: Secondary | ICD-10-CM

## 2019-06-25 DIAGNOSIS — E785 Hyperlipidemia, unspecified: Secondary | ICD-10-CM

## 2019-06-26 LAB — CMP14+EGFR
ALT: 6 IU/L (ref 0–44)
AST: 12 IU/L (ref 0–40)
Albumin/Globulin Ratio: 1 — ABNORMAL LOW (ref 1.2–2.2)
Albumin: 3.6 g/dL — ABNORMAL LOW (ref 3.8–4.8)
Alkaline Phosphatase: 67 IU/L (ref 39–117)
BUN/Creatinine Ratio: 10 (ref 10–24)
BUN: 9 mg/dL (ref 8–27)
Bilirubin Total: 0.4 mg/dL (ref 0.0–1.2)
CO2: 22 mmol/L (ref 20–29)
Calcium: 8.8 mg/dL (ref 8.6–10.2)
Chloride: 109 mmol/L — ABNORMAL HIGH (ref 96–106)
Creatinine, Ser: 0.89 mg/dL (ref 0.76–1.27)
GFR calc Af Amer: 104 mL/min/{1.73_m2} (ref >59)
GFR calc non Af Amer: 90 mL/min/{1.73_m2} (ref >59)
Globulin, Total: 3.7 g/dL (ref 1.5–4.5)
Glucose: 84 mg/dL (ref 65–99)
Potassium: 4.3 mmol/L (ref 3.5–5.2)
Sodium: 146 mmol/L — ABNORMAL HIGH (ref 134–144)
Total Protein: 7.3 g/dL (ref 6.0–8.5)

## 2019-06-26 LAB — LIPID PANEL
Chol/HDL Ratio: 2.5 ratio (ref 0.0–5.0)
Cholesterol, Total: 124 mg/dL (ref 100–199)
HDL: 50 mg/dL (ref >39)
LDL Calculated: 63 mg/dL (ref 0–99)
Triglycerides: 57 mg/dL (ref 0–149)
VLDL Cholesterol Cal: 11 mg/dL (ref 5–40)

## 2019-06-26 LAB — CBC
Hematocrit: 32.5 % — ABNORMAL LOW (ref 37.5–51.0)
Hemoglobin: 10.1 g/dL — ABNORMAL LOW (ref 13.0–17.7)
MCH: 27.6 pg (ref 26.6–33.0)
MCHC: 31.1 g/dL — ABNORMAL LOW (ref 31.5–35.7)
MCV: 89 fL (ref 79–97)
Platelets: 281 10*3/uL (ref 150–450)
RBC: 3.66 x10E6/uL — ABNORMAL LOW (ref 4.14–5.80)
RDW: 14.6 % (ref 11.6–15.4)
WBC: 5.4 10*3/uL (ref 3.4–10.8)

## 2019-06-26 LAB — T4, FREE: Free T4: 1 ng/dL (ref 0.82–1.77)

## 2019-06-26 LAB — T3: T3, Total: 73 ng/dL (ref 71–180)

## 2019-06-26 LAB — TSH: TSH: 0.642 u[IU]/mL (ref 0.450–4.500)

## 2019-07-03 ENCOUNTER — Telehealth: Payer: Self-pay | Admitting: Nurse Practitioner

## 2019-07-03 NOTE — Telephone Encounter (Signed)
Patient step son called wanting to follow up in regards to missed call. Please follow up.

## 2019-07-04 NOTE — Telephone Encounter (Signed)
CMA spoke to patient's step son and informed on lab results.

## 2019-07-14 ENCOUNTER — Telehealth: Payer: Self-pay

## 2019-07-14 NOTE — Telephone Encounter (Signed)
[place] cell has secure vm. [phone]. Leave msg with verbal to change discharge date from 8/15 to 07/13/19. Pt wasn't home 8/15.  Medicare requires verbal to change date.

## 2019-07-15 NOTE — Telephone Encounter (Addendum)
[facility] hasn't  received fax with verbal orders for physical therapy  fax [phone] Please follow up

## 2019-07-15 NOTE — Telephone Encounter (Signed)
CMA called [facility] and verbal approval Home Health discharge date.

## 2019-07-18 ENCOUNTER — Ambulatory Visit: Payer: BC Managed Care – PPO | Admitting: Nurse Practitioner

## 2019-07-24 ENCOUNTER — Ambulatory Visit: Payer: BC Managed Care – PPO | Admitting: Neurology

## 2019-07-24 ENCOUNTER — Encounter: Payer: Self-pay | Admitting: Neurology

## 2019-07-24 ENCOUNTER — Telehealth: Payer: Self-pay | Admitting: *Deleted

## 2019-07-24 NOTE — Telephone Encounter (Signed)
No showed new patient appointment. 

## 2019-07-29 ENCOUNTER — Ambulatory Visit: Payer: BC Managed Care – PPO | Admitting: Nurse Practitioner

## 2019-12-14 IMAGING — CT CT CERVICAL SPINE W/O CM
4 of 8 series · 12 of 33 positions shown, 13 images · non-contrast
Comparison: 01/24/2018

CLINICAL DATA: History of seizures with altered level of
consciousness.

EXAM:
CT HEAD WITHOUT CONTRAST
CT CERVICAL SPINE WITHOUT CONTRAST
TECHNIQUE: Multidetector CT imaging of the head and cervical spine was
performed following the standard protocol without intravenous
contrast. Multiplanar CT image reconstructions of the cervical spine
were also generated.

[Series 6: cor soft · coronal · 0.32mm/px · 3 of 72 slices shown]
[im 18/72  bone]
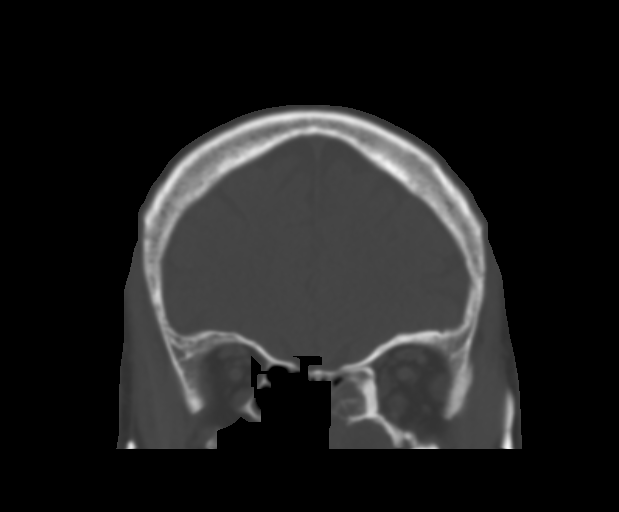
[im 36/72  bone]
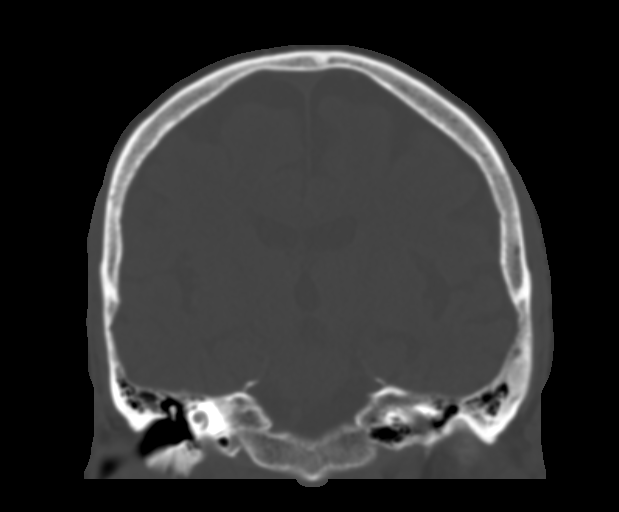
[im 54/72  bone]
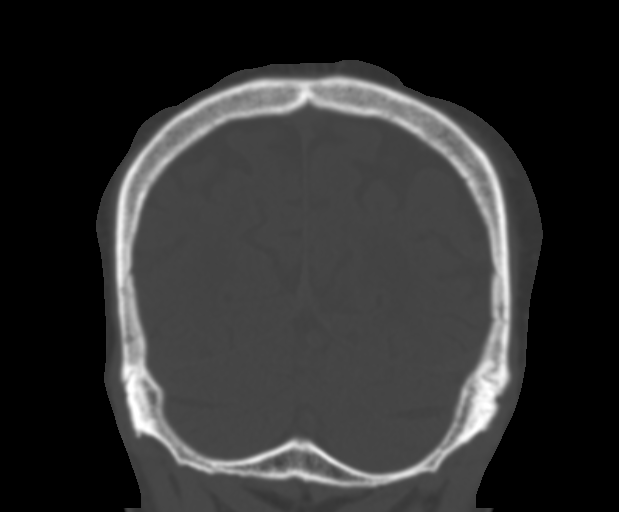

[Series 9: c spine soft · axial · 0.48mm/px · z∈[+1054,+1116]mm · 2 of 88 slices shown]
[im 30/88  soft-tissue]
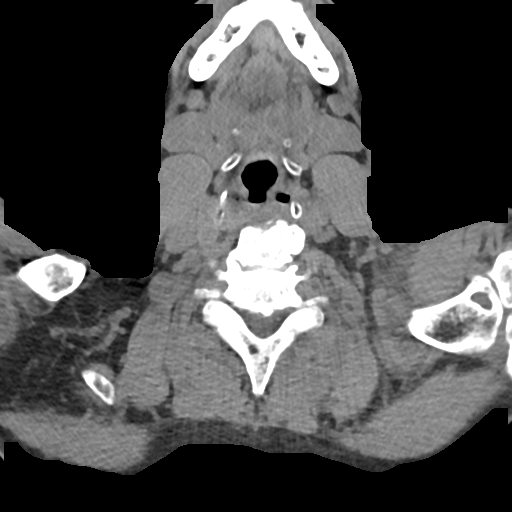
[im 59/88  soft-tissue]
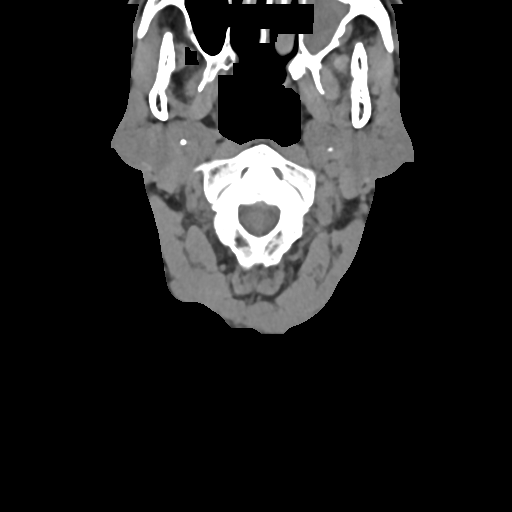

[Series 10: sag bone · sagittal · 0.26mm/px · 5 of 61 slices shown]
[im 11/61  bone]
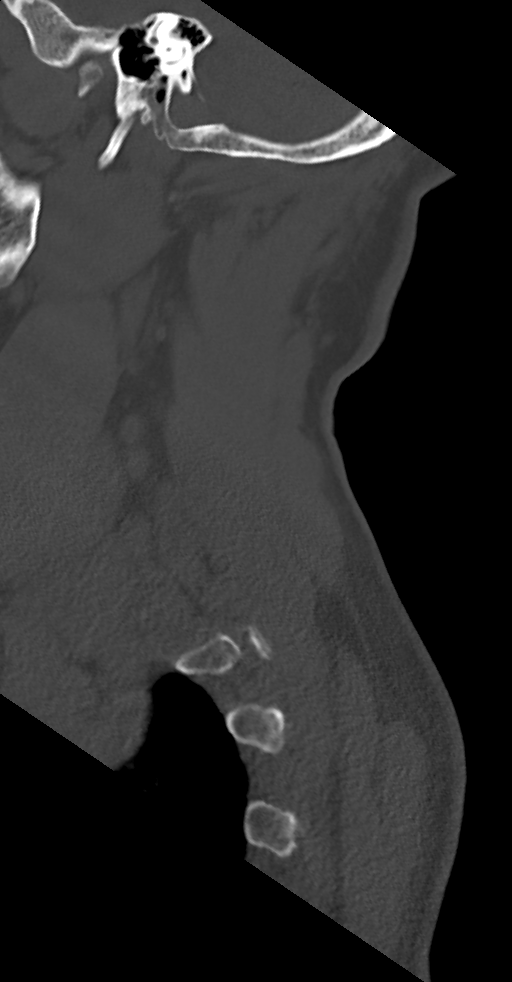
[im 21/61  bone]
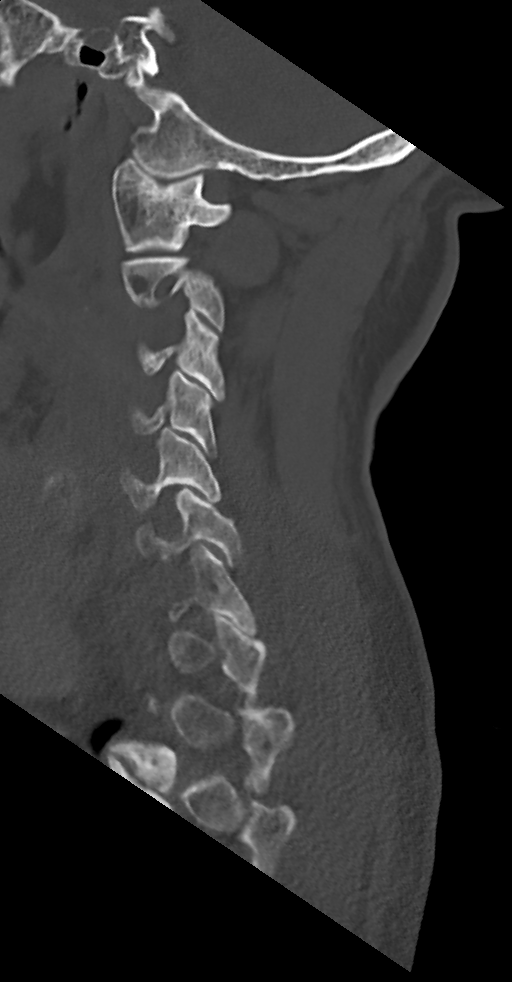
[im 31/61  bone]
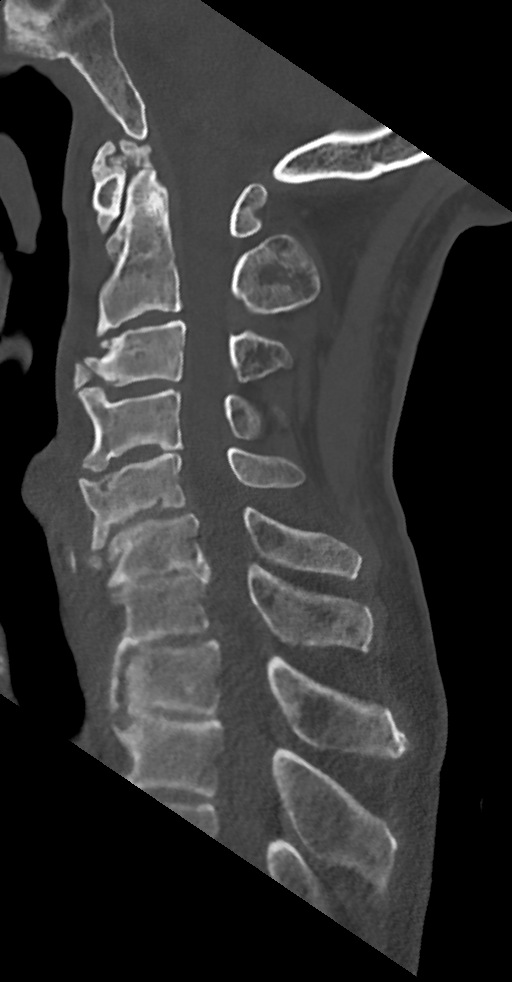
[im 41/61  bone]
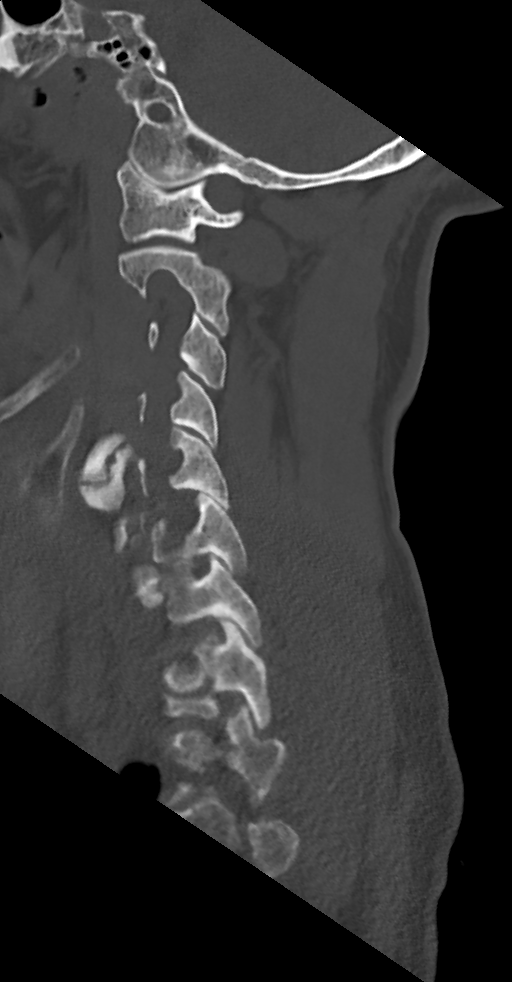
[im 51/61  bone]
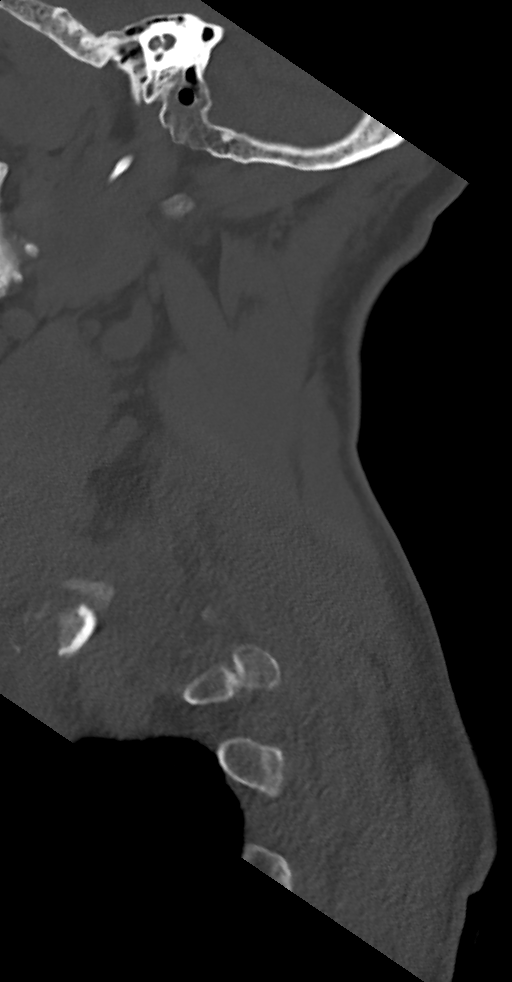

[Series 12: orthogonal axials · axial · 0.21mm/px · z∈[+1029,+1069]mm · 2 of 93 slices shown, 3 images]
[im 31/93  soft-tissue]
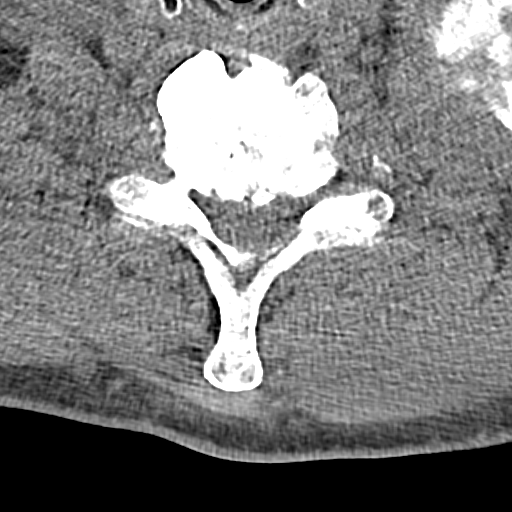
[im 31/93  bone]
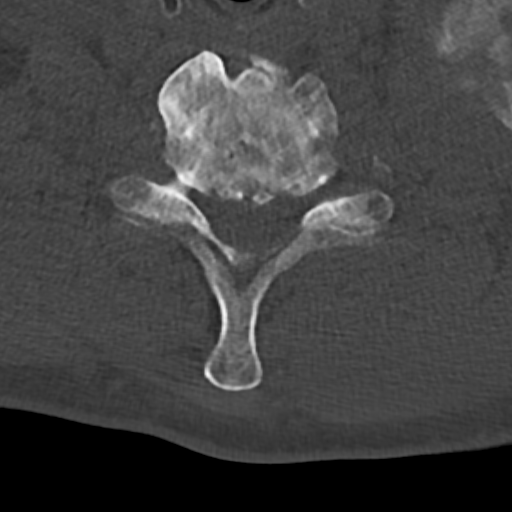
[im 62/93  bone]
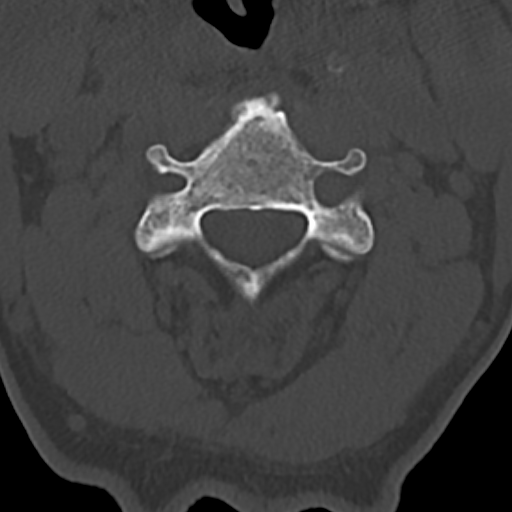

[12 of 33 positions shown; findings below may reference images not displayed]

FINDINGS: CT HEAD FINDINGS

Brain: Chronic stable age related involutional changes of the brain.
Chronic mild-to-moderate small vessel ischemia the periventricular
and subcortical white matter. No large vascular territory infarct,
hemorrhage, intra-axial mass nor extra-axial fluid. No effacement of
the basal cisterns. The brainstem and cerebellum appear nonacute.

Vascular: No hyperdense vessel sign.

Skull: No acute calvarial fracture or significant calvarial soft
tissue swelling.

Sinuses/Orbits: Left maxillary sinus wall thickening with
inspissated mucus and evidence left ostiomeatal unit obstruction
causing opacification of the left frontal sinus, ethmoid and
included maxillary sinuses. Findings are in keeping with chronic
sinus disease. Intact orbits and globes.

Other: None

CT CERVICAL SPINE FINDINGS

Alignment: Minimal anterolisthesis of C5 on C6.

Skull base and vertebrae: Intact skull base. No acute cervical spine
fracture. No suspicious osseous lesions. Bulky osteophytes are noted
off the anterior aspect of the cervical spine at all levels. This is
between C3 and C5.

Soft tissues and spinal canal: No prevertebral soft tissue swelling.
No visible canal hematoma.

Disc levels: Moderate to marked disc flattening at all levels of the
cervical spine. Mild osseous central canal stenosis at C6-7.
Uncovertebral joint osteoarthritis on the right at C2-3, bilaterally
at C4-5, C5-6 and C6-7. These contribute to moderate encroachment
greatest at C5-6 on the right.

Upper chest: Negative.

Other: None
IMPRESSION: 1. Chronic mild-to-moderate small vessel ischemic disease of
periventricular and subcortical white matter. No acute intracranial
abnormality.
2. Chronic paranasal sinusitis.
3. Cervical spondylosis without acute cervical spine fracture.

## 2020-03-07 ENCOUNTER — Other Ambulatory Visit: Payer: Self-pay | Admitting: Nurse Practitioner

## 2020-03-07 DIAGNOSIS — G40909 Epilepsy, unspecified, not intractable, without status epilepticus: Secondary | ICD-10-CM

## 2020-03-08 ENCOUNTER — Other Ambulatory Visit: Payer: Self-pay | Admitting: Nurse Practitioner

## 2020-03-08 DIAGNOSIS — G40909 Epilepsy, unspecified, not intractable, without status epilepticus: Secondary | ICD-10-CM

## 2020-03-09 ENCOUNTER — Other Ambulatory Visit: Payer: Self-pay | Admitting: Nurse Practitioner

## 2020-03-09 DIAGNOSIS — G40909 Epilepsy, unspecified, not intractable, without status epilepticus: Secondary | ICD-10-CM

## 2020-03-11 ENCOUNTER — Other Ambulatory Visit: Payer: Self-pay | Admitting: Pharmacist

## 2020-03-11 DIAGNOSIS — G40909 Epilepsy, unspecified, not intractable, without status epilepticus: Secondary | ICD-10-CM

## 2020-03-11 MED ORDER — LEVETIRACETAM 1000 MG PO TABS: 1000.0000 mg | ORAL_TABLET | Freq: Two times a day (BID) | ORAL | 0 refills | Status: DC

## 2020-03-16 ENCOUNTER — Other Ambulatory Visit: Payer: Self-pay | Admitting: Nurse Practitioner

## 2020-03-16 DIAGNOSIS — G40919 Epilepsy, unspecified, intractable, without status epilepticus: Secondary | ICD-10-CM

## 2020-03-16 DIAGNOSIS — G40909 Epilepsy, unspecified, not intractable, without status epilepticus: Secondary | ICD-10-CM

## 2020-03-16 NOTE — Progress Notes (Unsigned)
Mason Hart called to request refill of Keppra. I will fill this time as a courtesy. I will not be able to refill as he needs to see Neurology. He was a no show for his last neurology appt. I have referred him again. He should not miss this appointment as there will be no other options for refills of his seizure medication.

## 2020-03-16 NOTE — Progress Notes (Signed)
Spoke to patient's son and he is aware of PCP advising.

## 2020-03-17 ENCOUNTER — Other Ambulatory Visit: Payer: Self-pay

## 2020-03-17 ENCOUNTER — Ambulatory Visit: Payer: BC Managed Care – PPO | Attending: Nurse Practitioner | Admitting: Nurse Practitioner

## 2020-03-19 ENCOUNTER — Other Ambulatory Visit: Payer: Self-pay | Admitting: Pharmacist

## 2020-03-19 DIAGNOSIS — G40909 Epilepsy, unspecified, not intractable, without status epilepticus: Secondary | ICD-10-CM

## 2020-03-19 MED ORDER — LEVETIRACETAM 1000 MG PO TABS: 1000.0000 mg | ORAL_TABLET | Freq: Two times a day (BID) | ORAL | 0 refills | Status: DC

## 2020-04-13 ENCOUNTER — Encounter: Payer: Self-pay | Admitting: Nurse Practitioner

## 2020-04-13 ENCOUNTER — Other Ambulatory Visit: Payer: Self-pay

## 2020-04-13 ENCOUNTER — Ambulatory Visit: Payer: BC Managed Care – PPO | Attending: Nurse Practitioner | Admitting: Nurse Practitioner

## 2020-04-13 VITALS — BP 124/77 | HR 84 | Temp 97.7°F | Ht 73.0 in | Wt 197.0 lb

## 2020-04-13 DIAGNOSIS — F1010 Alcohol abuse, uncomplicated: Secondary | ICD-10-CM

## 2020-04-13 DIAGNOSIS — G40909 Epilepsy, unspecified, not intractable, without status epilepticus: Secondary | ICD-10-CM | POA: Diagnosis not present

## 2020-04-13 DIAGNOSIS — E162 Hypoglycemia, unspecified: Secondary | ICD-10-CM

## 2020-04-13 DIAGNOSIS — D649 Anemia, unspecified: Secondary | ICD-10-CM

## 2020-04-13 DIAGNOSIS — E039 Hypothyroidism, unspecified: Secondary | ICD-10-CM

## 2020-04-13 DIAGNOSIS — J9600 Acute respiratory failure, unspecified whether with hypoxia or hypercapnia: Secondary | ICD-10-CM

## 2020-04-13 DIAGNOSIS — J9601 Acute respiratory failure with hypoxia: Secondary | ICD-10-CM | POA: Insufficient documentation

## 2020-04-13 DIAGNOSIS — F10921 Alcohol use, unspecified with intoxication delirium: Secondary | ICD-10-CM | POA: Diagnosis not present

## 2020-04-13 DIAGNOSIS — E785 Hyperlipidemia, unspecified: Secondary | ICD-10-CM

## 2020-04-13 MED ORDER — LEVETIRACETAM 1000 MG PO TABS: 1000.0000 mg | ORAL_TABLET | Freq: Two times a day (BID) | ORAL | 0 refills | Status: DC

## 2020-04-13 MED ORDER — LEVOTHYROXINE SODIUM 25 MCG PO TABS: 25.0000 ug | ORAL_TABLET | Freq: Every day | ORAL | 0 refills | Status: DC

## 2020-04-13 MED ORDER — FOLIC ACID 1 MG PO TABS: 1.0000 mg | ORAL_TABLET | Freq: Every day | ORAL | 3 refills | Status: DC

## 2020-04-13 NOTE — Progress Notes (Signed)
Assessment & Plan:  Mason Hart was seen today for medication refill.  Diagnoses and all orders for this visit:  Seizure disorder ([place]) -     CMP14+EGFR -     TSH -     Discontinue: levETIRAcetam (KEPPRA) 1000 MG tablet; Take 1 tablet (1,000 mg total) by mouth 2 (two) times daily. Last fill! Must keep appointment. -     levETIRAcetam (KEPPRA) 1000 MG tablet; Take 1 tablet (1,000 mg total) by mouth 2 (two) times daily. Last fill! Must keep appointment.  Alcohol intoxication with delirium ([place]) -     CBC  Acute respiratory failure, unspecified whether with hypoxia or hypercapnia (HCC) Resolved  Hypothyroidism, unspecified type -     TSH -     levothyroxine (SYNTHROID) 25 MCG tablet; Take 1 tablet (25 mcg total) by mouth daily at 6 (six) AM.  Alcohol abuse -     folic acid (FOLVITE) 1 MG tablet; Take 1 tablet (1 mg total) by mouth daily. AVOID alcohol use  Hypoglycemia -     Hemoglobin A1c  Chronic anemia -     CBC -     Fecal occult blood, imunochemical([facility])  Dyslipidemia -     Lipid panel Lab Results  Component Value Date   LDLCALC 63 06/25/2019   INSTRUCTIONS: Work on a low fat, heart healthy diet and participate in regular aerobic exercise program by working out at least 150 minutes per week; 5 days a week-30 minutes per day. Avoid red meat/beef/steak,  fried foods. junk foods, sodas, sugary drinks, unhealthy snacking, alcohol and smoking.  Drink at least 80 oz of water per day and monitor your carbohydrate intake daily.      Patient has been counseled on age-appropriate routine health concerns for screening and prevention. These are reviewed and up-to-date. Referrals have been placed accordingly. Immunizations are up-to-date or declined.    Subjective:   Chief Complaint  Patient presents with  . Medication Refill    Pt. is requesting medication refill for Keppra and Levothyroxine.    HPI Mason Hart 64 y.o. male presents to office today for follow  up.  Seizures History of seizures and medication nonadherence. Has appt with Neurology on 04-29-2020. States has cut back on alcohol intake. Only a "few beers" a day. He is aware he should not drink alcohol and it is contraindicated with his anti seizure medication. Denies any seizure activity since last April.    Review of Systems  Constitutional: Negative for fever, malaise/fatigue and weight loss.  HENT: Negative.  Negative for nosebleeds.   Eyes: Negative.  Negative for blurred vision, double vision and photophobia.  Respiratory: Negative.  Negative for cough and shortness of breath.   Cardiovascular: Negative.  Negative for chest pain, palpitations and leg swelling.  Gastrointestinal: Negative.  Negative for heartburn, nausea and vomiting.  Musculoskeletal: Negative.  Negative for myalgias.  Neurological: Positive for seizures and weakness (right sided weakness). Negative for dizziness, focal weakness and headaches.  Psychiatric/Behavioral: Negative.  Negative for suicidal ideas.    Past Medical History:  Diagnosis Date  . Alcohol abuse   . Hypothyroidism   . Hypothyroidism   . Noncompliance with medication regimen   . Seizures ([place])     Past Surgical History:  Procedure Laterality Date  . NO PAST SURGERIES      Family History  Problem Relation Age of Onset  . Heart disease Mother   . Cancer Neg Hx   . Diabetes Neg Hx   . Stroke  Neg Hx     Social History Reviewed with no changes to be made today.   Outpatient Medications Prior to Visit  Medication Sig Dispense Refill  . levETIRAcetam (KEPPRA) 1000 MG tablet Take 1 tablet (1,000 mg total) by mouth 2 (two) times daily. Last fill! Must keep appointment. 50 tablet 0  . acetaminophen (TYLENOL) 325 MG tablet Take 325 mg by mouth 2 (two) times daily as needed for mild pain.    . feeding supplement, ENSURE ENLIVE, (ENSURE ENLIVE) LIQD Take 237 mLs by mouth 3 (three) times daily between meals. (Patient not taking: Reported on  06/04/2019) 30 Bottle 0  . folic acid (FOLVITE) 1 MG tablet Take 1 tablet (1 mg total) by mouth daily. (Patient not taking: Reported on 04/13/2020) 90 tablet 3  . levothyroxine (SYNTHROID) 25 MCG tablet Take 1 tablet (25 mcg total) by mouth daily at 6 (six) AM. 30 tablet 0   No facility-administered medications prior to visit.    No Known Allergies     Objective:    BP (!) 144/84 (BP Location: Left Arm, Patient Position: Sitting, Cuff Size: Normal)   Pulse 84   Temp 97.7 F (36.5 C) (Temporal)   Ht _0  (1.854 m)   Wt 197 lb (89.4 kg)   SpO2 99%   BMI 25.99 kg/m  Wt Readings from Last 3 Encounters:  04/13/20 197 lb (89.4 kg)  03/20/19 210 lb 1.6 oz (95.3 kg)  12/18/18 233 lb 11 oz (106 kg)    Physical Exam Vitals and nursing note reviewed.  Constitutional:      Appearance: He is well-developed.  HENT:     Head: Normocephalic and atraumatic.  Cardiovascular:     Rate and Rhythm: Normal rate and regular rhythm.     Heart sounds: Normal heart sounds. No murmur. No friction rub. No gallop.   Pulmonary:     Effort: Pulmonary effort is normal. No tachypnea or respiratory distress.     Breath sounds: Normal breath sounds. No decreased breath sounds, wheezing, rhonchi or rales.  Chest:     Chest wall: No tenderness.  Abdominal:     General: Bowel sounds are normal.     Palpations: Abdomen is soft.  Musculoskeletal:        General: Normal range of motion.     Cervical back: Normal range of motion.     Right lower leg: Edema present.     Left lower leg: Edema present.  Skin:    General: Skin is warm and dry.  Neurological:     Mental Status: He is alert and oriented to person, place, and time.     Coordination: Coordination normal.     Gait: Gait abnormal (using cane).  Psychiatric:        Behavior: Behavior normal. Behavior is cooperative.        Thought Content: Thought content normal.        Judgment: Judgment normal.          Patient has been counseled  extensively about nutrition and exercise as well as the importance of adherence with medications and regular follow-up. The patient was given clear instructions to go to ER or return to medical center if symptoms don't improve, worsen or new problems develop. The patient verbalized understanding.   Follow-up: Return in about 6 months (around 10/14/2020).   Gildardo Pounds, [name]-BC [facility] and [place], [place]   04/13/2020, 3:30 PM

## 2020-04-14 LAB — CMP14+EGFR
ALT: 12 IU/L (ref 0–44)
AST: 29 IU/L (ref 0–40)
Albumin/Globulin Ratio: 1 — ABNORMAL LOW (ref 1.2–2.2)
Albumin: 3.9 g/dL (ref 3.8–4.8)
Alkaline Phosphatase: 79 IU/L (ref 48–121)
BUN/Creatinine Ratio: 23 (ref 10–24)
BUN: 25 mg/dL (ref 8–27)
Bilirubin Total: 0.6 mg/dL (ref 0.0–1.2)
CO2: 22 mmol/L (ref 20–29)
Calcium: 9.3 mg/dL (ref 8.6–10.2)
Chloride: 93 mmol/L — ABNORMAL LOW (ref 96–106)
Creatinine, Ser: 1.09 mg/dL (ref 0.76–1.27)
GFR calc Af Amer: 82 mL/min/{1.73_m2} (ref >59)
GFR calc non Af Amer: 71 mL/min/{1.73_m2} (ref >59)
Globulin, Total: 4 g/dL (ref 1.5–4.5)
Glucose: 86 mg/dL (ref 65–99)
Potassium: 3.6 mmol/L (ref 3.5–5.2)
Sodium: 133 mmol/L — ABNORMAL LOW (ref 134–144)
Total Protein: 7.9 g/dL (ref 6.0–8.5)

## 2020-04-14 LAB — LIPID PANEL
Chol/HDL Ratio: 2.3 ratio (ref 0.0–5.0)
Cholesterol, Total: 154 mg/dL (ref 100–199)
HDL: 68 mg/dL (ref >39)
LDL Chol Calc (NIH): 70 mg/dL (ref 0–99)
Triglycerides: 86 mg/dL (ref 0–149)
VLDL Cholesterol Cal: 16 mg/dL (ref 5–40)

## 2020-04-14 LAB — TSH: TSH: 2.81 u[IU]/mL (ref 0.450–4.500)

## 2020-04-14 LAB — CBC
Hematocrit: 35.7 % — ABNORMAL LOW (ref 37.5–51.0)
Hemoglobin: 11.6 g/dL — ABNORMAL LOW (ref 13.0–17.7)
MCH: 28.8 pg (ref 26.6–33.0)
MCHC: 32.5 g/dL (ref 31.5–35.7)
MCV: 89 fL (ref 79–97)
Platelets: 302 10*3/uL (ref 150–450)
RBC: 4.03 x10E6/uL — ABNORMAL LOW (ref 4.14–5.80)
RDW: 13.9 % (ref 11.6–15.4)
WBC: 9.9 10*3/uL (ref 3.4–10.8)

## 2020-04-14 LAB — HEMOGLOBIN A1C
Est. average glucose Bld gHb Est-mCnc: 91 mg/dL
Hgb A1c MFr Bld: 4.8 % (ref 4.8–5.6)

## 2020-04-29 ENCOUNTER — Encounter: Payer: Self-pay | Admitting: Neurology

## 2020-04-29 ENCOUNTER — Other Ambulatory Visit: Payer: Self-pay

## 2020-04-29 ENCOUNTER — Ambulatory Visit (INDEPENDENT_AMBULATORY_CARE_PROVIDER_SITE_OTHER): Payer: Medicare Other | Admitting: Neurology

## 2020-04-29 VITALS — BP 117/62 | HR 78 | Ht 73.0 in | Wt 229.0 lb

## 2020-04-29 DIAGNOSIS — G40909 Epilepsy, unspecified, not intractable, without status epilepticus: Secondary | ICD-10-CM | POA: Diagnosis not present

## 2020-04-29 DIAGNOSIS — R269 Unspecified abnormalities of gait and mobility: Secondary | ICD-10-CM | POA: Diagnosis not present

## 2020-04-29 MED ORDER — LEVETIRACETAM 1000 MG PO TABS: 1000.0000 mg | ORAL_TABLET | Freq: Two times a day (BID) | ORAL | 4 refills | Status: DC

## 2020-04-29 NOTE — Progress Notes (Signed)
PATIENT: Mason Hart DOB: Feb 02, 1956  Chief Complaint  Patient presents with  . Seizures    Reports his last seizure occurred in April 2020. He is currently taking Keppra 1000mg , one tablet BID. States he has cut back on his alcohol intake.  He drinks 1-2 beers each day and occasional liquor.  PCP    Marland Kitchen, NP     HISTORICAL  Mason Hart is a 64 year old male, seen in request by his primary care nurse practitioner [phone] for evaluation of seizure, initial evaluation was on April 29, 2020.  I have reviewed and summarized the referring note from the referring physician.  He had a past medical history of hypothyroidism, on supplement, is a request tired school custodian, previous heavy alcohol use, he reported before 2020, he was drinking on a daily basis, sometimes a bottle of a heartbeat occurring 1 day  He began to have seizure-like spells since age 80s, it tends to be associated withdrawal from alcohol, it happened about once a year, he was treated with Keppra, but was not compliant with his medication  He was admitted to hospital on April 21-28 2020, he was found on the floor by his family members, body was rigid, foaming from his mouth, he had no recollection of event, upon initial hospital admission, he was noted to be somnolent, hypothermia temperature of 32.8, he was admitted to stepdown unit, received IV Keppra, repeat EEG showed generalized slowing, but no active epileptiform discharge, he was later changed to Keppra table 1000 mg twice a day, patient was also noted to have persistent hypoglycemia, required D10 infusion, later transitioned to D5 infusion, C-peptide was low at 1.0 that was consistent with normal response to hypoglycemia,  He lives by himself, still drink but about once a day week with his friends with mild to moderate amount, he only drives occasionally, had gait abnormality due to his low back pain,  I personally reviewed MRI of brain with  without contrast on March 19, 2019, no acute abnormality, generalized atrophy, mild to moderate supratentorium small vessel disease  Laboratory evaluation in 2021 showed normal lipid panel, TSH, A1c, CMP with sodium was mildly decreased 133, albumin of 1.0, CBC hemoglobin of 11.6  EEG in April 2020, January 2020, December 2019 showed generalized slow, no evidence of epileptiform discharge  REVIEW OF SYSTEMS: Full 14 system review of systems performed and notable only for as above All other review of systems were negative.  ALLERGIES: No Known Allergies  HOME MEDICATIONS: Current Outpatient Medications  Medication Sig Dispense Refill  . acetaminophen (TYLENOL) 325 MG tablet Take 325 mg by mouth 2 (two) times daily as needed for mild pain.    . folic acid (FOLVITE) 1 MG tablet Take 1 tablet (1 mg total) by mouth daily. 90 tablet 3  . levETIRAcetam (KEPPRA) 1000 MG tablet Take 1 tablet (1,000 mg total) by mouth 2 (two) times daily. Last fill! Must keep appointment. 60 tablet 0  . levothyroxine (SYNTHROID) 25 MCG tablet Take 1 tablet (25 mcg total) by mouth daily at 6 (six) AM. 30 tablet 0   No current facility-administered medications for this visit.    PAST MEDICAL HISTORY: Past Medical History:  Diagnosis Date  . Alcohol abuse   . Hypothyroidism   . Hypothyroidism   . Noncompliance with medication regimen   . Seizures (HCC)     PAST SURGICAL HISTORY: Past Surgical History:  Procedure Laterality Date  . NO PAST SURGERIES  FAMILY HISTORY: Family History  Problem Relation Age of Onset  . Heart disease Mother   . Other Father        unsure of medical history  . Cancer Neg Hx   . Diabetes Neg Hx   . Stroke Neg Hx     SOCIAL HISTORY: Social History   Socioeconomic History  . Marital status: Legally Separated    Spouse name: Not on file  . Number of children: 0  . Years of education: 68  . Highest education level: High school graduate  Occupational History  .  Occupation: Retired  Tobacco Use  . Smoking status: Never Smoker  . Smokeless tobacco: Never Used  Substance and Sexual Activity  . Alcohol use: Yes    Comment: 1-2 beers per day, occasional liquor  . Drug use: No  . Sexual activity: Not Currently  Other Topics Concern  . Not on file  Social History Narrative   Lives alone.   Right-handed.   Occasional use of caffeine.   Social Determinants of Health   Financial Resource Strain:   . Difficulty of Paying Living Expenses:   Food Insecurity:   . Worried About Programme researcher, broadcasting/film/video in the Last Year:   . Barista in the Last Year:   Transportation Needs:   . Freight forwarder (Medical):   Marland Kitchen Lack of Transportation (Non-Medical):   Physical Activity:   . Days of Exercise per Week:   . Minutes of Exercise per Session:   Stress:   . Feeling of Stress :   Social Connections:   . Frequency of Communication with Friends and Family:   . Frequency of Social Gatherings with Friends and Family:   . Attends Religious Services:   . Active Member of Clubs or Organizations:   . Attends Banker Meetings:   Marland Kitchen Marital Status:   Intimate Partner Violence:   . Fear of Current or Ex-Partner:   . Emotionally Abused:   Marland Kitchen Physically Abused:   . Sexually Abused:      PHYSICAL EXAM   Vitals:   04/29/20 1514  BP: 117/62  Pulse: 78  Weight: 229 lb (103.9 kg)  Height: 6\' 1"  (1.854 m)    Not recorded      Body mass index is 30.21 kg/m.  PHYSICAL EXAMNIATION:  Gen: NAD, conversant, well nourised, well groomed                     Cardiovascular: Regular rate rhythm, no peripheral edema, warm, nontender. Eyes: Conjunctivae clear without exudates or hemorrhage Neck: Supple, no carotid bruits. Pulmonary: Clear to auscultation bilaterally   NEUROLOGICAL EXAM:  MENTAL STATUS: Speech:    Speech is normal; fluent and spontaneous with normal comprehension.  Cognition:     Orientation to time, place and person      Normal recent and remote memory     Normal Attention span and concentration     Normal Language, naming, repeating,spontaneous speech     Fund of knowledge   CRANIAL NERVES: CN II: Visual fields are full to confrontation. Pupils are round equal and briskly reactive to light. CN III, IV, VI: extraocular movement are normal. No ptosis. CN V: Facial sensation is intact to light touch CN VII: Face is symmetric with normal eye closure  CN VIII: Hearing is normal to causal conversation. CN IX, X: Phonation is normal. CN XI: Head turning and shoulder shrug are intact  MOTOR: There is no  pronator drift of out-stretched arms. Muscle bulk and tone are normal. Muscle strength is normal.  REFLEXES: Reflexes are 2+ and symmetric at the biceps, triceps, knees, and ankles. Plantar responses are flexor.  SENSORY: Intact to light touch, pinprick and vibratory sensation are intact in fingers and toes.  COORDINATION: There is no trunk or limb dysmetria noted.  GAIT/STANCE: He needs push-up to get up from seated position, antalgic and limp  DIAGNOSTIC DATA (LABS, IMAGING, TESTING) - I reviewed patient records, labs, notes, testing and imaging myself where available.   ASSESSMENT AND PLAN  Mason Hart is a 64 y.o. male   Recurrent seizures  Most likely due to noncompliant with his medications  Check Keppra level  Refilled his prescription Keppra 1000 mg twice a day  Is advised to call our office if he has recurrent seizure, no driving until seizure-free for 6 months  Marcial Pacas, M.D. Ph.D.  [facility] [address], [place], [place] [address] Ph: [phone] Fax: [phone]  CC: Gildardo Pounds, NP

## 2020-05-02 LAB — VITAMIN B12: Vitamin B-12: 412 pg/mL (ref 232–1245)

## 2020-05-02 LAB — LEVETIRACETAM LEVEL: Levetiracetam Lvl: 22.6 ug/mL (ref 10.0–40.0)

## 2020-05-03 ENCOUNTER — Telehealth: Payer: Self-pay | Admitting: *Deleted

## 2020-05-03 ENCOUNTER — Encounter: Payer: Self-pay | Admitting: Gastroenterology

## 2020-05-03 NOTE — Telephone Encounter (Signed)
-----   Message from Levert Feinstein, MD sent at 05/03/2020  2:22 PM EDT ----- Please call patient for normal laboratory result

## 2020-05-03 NOTE — Telephone Encounter (Signed)
I have spoken with his stepson/POA, [name]. He verbalized understanding of the lab results.

## 2020-05-13 ENCOUNTER — Other Ambulatory Visit: Payer: Self-pay | Admitting: Nurse Practitioner

## 2020-05-13 DIAGNOSIS — E039 Hypothyroidism, unspecified: Secondary | ICD-10-CM

## 2020-05-26 ENCOUNTER — Ambulatory Visit (INDEPENDENT_AMBULATORY_CARE_PROVIDER_SITE_OTHER): Payer: BC Managed Care – PPO | Admitting: Gastroenterology

## 2020-05-26 ENCOUNTER — Other Ambulatory Visit (INDEPENDENT_AMBULATORY_CARE_PROVIDER_SITE_OTHER): Payer: BC Managed Care – PPO

## 2020-05-26 ENCOUNTER — Encounter: Payer: Self-pay | Admitting: Gastroenterology

## 2020-05-26 VITALS — BP 110/84 | HR 112 | Ht 67.25 in | Wt 208.0 lb

## 2020-05-26 DIAGNOSIS — K7460 Unspecified cirrhosis of liver: Secondary | ICD-10-CM | POA: Diagnosis not present

## 2020-05-26 DIAGNOSIS — D649 Anemia, unspecified: Secondary | ICD-10-CM

## 2020-05-26 LAB — IBC + FERRITIN
Ferritin: 10.8 ng/mL — ABNORMAL LOW (ref 22.0–322.0)
Iron: 55 ug/dL (ref 42–165)
Saturation Ratios: 14.3 % — ABNORMAL LOW (ref 20.0–50.0)
Transferrin: 275 mg/dL (ref 212.0–360.0)

## 2020-05-26 LAB — PROTIME-INR
INR: 1.2 ratio — ABNORMAL HIGH (ref 0.8–1.0)
Prothrombin Time: 13.6 s — ABNORMAL HIGH (ref 9.6–13.1)

## 2020-05-26 LAB — FOLATE: Folate: >24.8 ng/mL (ref >5.9)

## 2020-05-26 MED ORDER — PLENVU 140 G PO SOLR: 1.0000 | ORAL | 0 refills | Status: DC

## 2020-05-26 NOTE — Progress Notes (Signed)
05/26/2020 Mason Hart [id number] July 12, 1956   HISTORY OF PRESENT ILLNESS: This is a 64 year old male who is new to our office.  Was referred here by Mason Denver, NP, for evaluation of chronic anemia.  The patient is a poor/limited historian.  His most recent labs are from Apr 13, 2020 at which time his hemoglobin was 11.6 g as compared to 10.1 g in July 2020.  MCV is normal at 89.  Vitamin B12 level is normal.  Iron studies and folate were not checked.  Fecal occult blood immunochemistry was ordered, but has not been completed.  He denies any sign of blood in his stool.  No black stools.  He says he moves his bowels regularly, no issues.  He denies any abdominal pain.  No dysphagia.  He says his appetite is good.  He denies any lower extremity swelling.  He has never had EGD or colonoscopy in the past.  He does have seizure disorder, but per neurology's most recent note it seems that this has been well controlled.  During hospitalization April 2020 he had imaging showing cirrhosis.  He does use alcohol, previously quite heavily.  He continues to use alcohol, but says to a much less degree than what he had in the past, but does not quantify how much specifically.  Once again, he denies any lower extremity edema.  His platelets are normal.  LFTs are normal.   Past Medical History:  Diagnosis Date  . Alcohol abuse   . Hypertension   . Hypothyroidism   . Noncompliance with medication regimen   . RBBB (right bundle branch block)   . Seizures (HCC)    Past Surgical History:  Procedure Laterality Date  . NO PAST SURGERIES      reports that he has never smoked. He has never used smokeless tobacco. He reports current alcohol use. He reports that he does not use drugs. family history includes Heart disease in his mother; Other in his father. No Known Allergies    Outpatient Encounter Medications as of 05/26/2020  Medication Sig  . acetaminophen (TYLENOL) 325 MG tablet Take 325 mg by  mouth 2 (two) times daily as needed for mild pain. (Patient not taking: Reported on 05/26/2020)  . EUTHYROX 25 MCG tablet TAKE 1 TABLET BY MOUTH DAILY AT 6 AM (Patient not taking: Reported on 05/26/2020)  . folic acid (FOLVITE) 1 MG tablet Take 1 tablet (1 mg total) by mouth daily. (Patient not taking: Reported on 05/26/2020)  . levETIRAcetam (KEPPRA) 1000 MG tablet Take 1 tablet (1,000 mg total) by mouth 2 (two) times daily. Last fill! Must keep appointment. (Patient not taking: Reported on 05/26/2020)   No facility-administered encounter medications on file as of 05/26/2020.     REVIEW OF SYSTEMS  : All other systems reviewed and negative except where noted in the History of Present Illness.   PHYSICAL EXAM: Ht 5' 7.25" (1.708 m) Comment: height measured without shoes  Wt 208 lb (94.3 kg)   BMI 32.34 kg/m  General: Well developed AA male in no acute distress Head: Normocephalic and atraumatic Eyes:  Sclerae anicteric, conjunctiva pink. Ears: Normal auditory acuity Lungs: Clear throughout to auscultation; no increased WOB. Heart: Regular rate and rhythm; no M/R/G. Abdomen: Soft, non-distended.  BS present.  Non-tender. Rectal:  Will be done at the time of colonoscopy. Musculoskeletal: Symmetrical with no gross deformities  Skin: No lesions on visible extremities Extremities: No edema  Neurological: Alert oriented x 4, grossly non-focal Psychological:  Alert and cooperative. Normal mood and affect  ASSESSMENT AND PLAN: *Normocytic anemia: Hemoglobin 11.6 g, which is actually improved compared to last July when it was 10.1 grams.  MCV is 89.  Vitamin B12 levels are normal.  Iron studies are not checked.  I will check a folate level as well as a ferritin and IBC panel.  He is never had endoscopy or colonoscopy in the past.  We will plan for both of those with Dr. Lavon Hart. *Cirrhosis: Seen on imaging last year.  Very likely due to alcohol.  He continues to use alcohol, but in much less  amounts.  We will plan for variceal screening as above.  We will check some additional labs including an AFP, PT/INR, and viral hepatitis studies.  If he is not immune to Hep A and B then he will need vaccinations.  So far by labs and physical exam he appears to be well compensated.  **Of note, the patient's procedures were scheduled out several weeks.  I do not believe that these are urgent at this point and patient was noted to have bedbugs present on his clothing while in our office today.  I discussed with the patient and with his Mason Hart, and they will work on removing him from his current living conditions and getting that taken care of before his procedures at the end of August. **He will also need repeat imaging/right upper quadrant abdominal ultrasound for [name] screening as his last was in April 2020.  We will plan for that in the near future as well.  CC:  Mason Rigg, NP

## 2020-05-26 NOTE — Patient Instructions (Signed)
If you are age 66 or older, your body mass index should be between 23-30. Your Body mass index is 32.34 kg/m. If this is out of the aforementioned range listed, please consider follow up with your Primary Care Provider.  If you are age 57 or younger, your body mass index should be between 19-25. Your Body mass index is 32.34 kg/m. If this is out of the aformentioned range listed, please consider follow up with your Primary Care Provider.   You have been scheduled for an endoscopy and colonoscopy. Please follow the written instructions given to you at your visit today. Please pick up your prep supplies at the pharmacy within the next 1-3 days. If you use inhalers (even only as needed), please bring them with you on the day of your procedure.  Your provider has requested that you go to the basement level for lab work before leaving today. Press "B" on the elevator. The lab is located at the first door on the left as you exit the elevator.  Due to recent changes in healthcare laws, you may see the results of your imaging and laboratory studies on MyChart before your provider has had a chance to review them.  We understand that in some cases there may be results that are confusing or concerning to you. Not all laboratory results come back in the same time frame and the provider may be waiting for multiple results in order to interpret others.  Please give [place] 48 hours in order for your provider to thoroughly review all the results before contacting the office for clarification of your results.   Follow up pending the results of labs and Procedure.

## 2020-05-27 LAB — AFP TUMOR MARKER: AFP-Tumor Marker: 2.7 ng/mL (ref <6.1)

## 2020-05-27 NOTE — Addendum Note (Signed)
Addended by: Arville Care on: 05/27/2020 04:30 PM   Modules accepted: Orders

## 2020-05-27 NOTE — Progress Notes (Signed)
Reviewed and agree with documentation and assessment and plan. K. Veena Geralyn Figiel , MD   

## 2020-07-19 ENCOUNTER — Telehealth: Payer: Self-pay | Admitting: Gastroenterology

## 2020-07-20 NOTE — Telephone Encounter (Signed)
Ok, please place him in recall for Nov. Thanks

## 2020-07-23 ENCOUNTER — Encounter: Payer: BC Managed Care – PPO | Admitting: Gastroenterology

## 2020-10-13 ENCOUNTER — Encounter: Payer: Self-pay | Admitting: Gastroenterology

## 2020-10-20 ENCOUNTER — Emergency Department (HOSPITAL_COMMUNITY): Payer: Medicare Other

## 2020-10-20 ENCOUNTER — Inpatient Hospital Stay (HOSPITAL_COMMUNITY)
Admission: EM | Admit: 2020-10-20 | Discharge: 2020-10-24 | DRG: 922 | Disposition: A | Discharge status: Home Health | Payer: Medicare Other | Attending: Internal Medicine | Admitting: Internal Medicine

## 2020-10-20 DIAGNOSIS — S0181XA Laceration without foreign body of other part of head, initial encounter: Secondary | ICD-10-CM

## 2020-10-20 DIAGNOSIS — Z9114 Patient's other noncompliance with medication regimen: Secondary | ICD-10-CM

## 2020-10-20 DIAGNOSIS — I10 Essential (primary) hypertension: Secondary | ICD-10-CM | POA: Diagnosis present

## 2020-10-20 DIAGNOSIS — E039 Hypothyroidism, unspecified: Secondary | ICD-10-CM | POA: Diagnosis present

## 2020-10-20 DIAGNOSIS — G40909 Epilepsy, unspecified, not intractable, without status epilepticus: Secondary | ICD-10-CM | POA: Diagnosis present

## 2020-10-20 DIAGNOSIS — E872 Acidosis, unspecified: Secondary | ICD-10-CM

## 2020-10-20 DIAGNOSIS — R001 Bradycardia, unspecified: Secondary | ICD-10-CM | POA: Diagnosis present

## 2020-10-20 DIAGNOSIS — T68XXXA Hypothermia, initial encounter: Secondary | ICD-10-CM | POA: Diagnosis not present

## 2020-10-20 DIAGNOSIS — E162 Hypoglycemia, unspecified: Secondary | ICD-10-CM | POA: Diagnosis present

## 2020-10-20 DIAGNOSIS — S01111A Laceration without foreign body of right eyelid and periocular area, initial encounter: Secondary | ICD-10-CM | POA: Diagnosis present

## 2020-10-20 DIAGNOSIS — G9341 Metabolic encephalopathy: Secondary | ICD-10-CM | POA: Diagnosis present

## 2020-10-20 DIAGNOSIS — D6959 Other secondary thrombocytopenia: Secondary | ICD-10-CM | POA: Diagnosis present

## 2020-10-20 DIAGNOSIS — F10239 Alcohol dependence with withdrawal, unspecified: Secondary | ICD-10-CM | POA: Diagnosis present

## 2020-10-20 DIAGNOSIS — Z8249 Family history of ischemic heart disease and other diseases of the circulatory system: Secondary | ICD-10-CM

## 2020-10-20 DIAGNOSIS — D509 Iron deficiency anemia, unspecified: Secondary | ICD-10-CM | POA: Diagnosis present

## 2020-10-20 DIAGNOSIS — D696 Thrombocytopenia, unspecified: Secondary | ICD-10-CM | POA: Diagnosis present

## 2020-10-20 DIAGNOSIS — Z79899 Other long term (current) drug therapy: Secondary | ICD-10-CM

## 2020-10-20 DIAGNOSIS — I959 Hypotension, unspecified: Secondary | ICD-10-CM | POA: Diagnosis present

## 2020-10-20 DIAGNOSIS — D649 Anemia, unspecified: Secondary | ICD-10-CM

## 2020-10-20 DIAGNOSIS — W19XXXA Unspecified fall, initial encounter: Secondary | ICD-10-CM | POA: Diagnosis present

## 2020-10-20 DIAGNOSIS — I4510 Unspecified right bundle-branch block: Secondary | ICD-10-CM | POA: Diagnosis present

## 2020-10-20 DIAGNOSIS — X31XXXA Exposure to excessive natural cold, initial encounter: Secondary | ICD-10-CM

## 2020-10-20 DIAGNOSIS — E878 Other disorders of electrolyte and fluid balance, not elsewhere classified: Secondary | ICD-10-CM | POA: Diagnosis present

## 2020-10-20 DIAGNOSIS — Y92410 Unspecified street and highway as the place of occurrence of the external cause: Secondary | ICD-10-CM

## 2020-10-20 DIAGNOSIS — R4182 Altered mental status, unspecified: Secondary | ICD-10-CM | POA: Diagnosis not present

## 2020-10-20 DIAGNOSIS — Z23 Encounter for immunization: Secondary | ICD-10-CM

## 2020-10-20 DIAGNOSIS — D61818 Other pancytopenia: Secondary | ICD-10-CM | POA: Diagnosis present

## 2020-10-20 DIAGNOSIS — E876 Hypokalemia: Secondary | ICD-10-CM | POA: Diagnosis present

## 2020-10-20 DIAGNOSIS — Z20822 Contact with and (suspected) exposure to covid-19: Secondary | ICD-10-CM | POA: Diagnosis present

## 2020-10-20 LAB — COMPREHENSIVE METABOLIC PANEL
ALT: 28 U/L (ref 0–44)
AST: 34 U/L (ref 15–41)
Albumin: 3.4 g/dL — ABNORMAL LOW (ref 3.5–5.0)
Alkaline Phosphatase: 63 U/L (ref 38–126)
Anion gap: 16 — ABNORMAL HIGH (ref 5–15)
BUN: 20 mg/dL (ref 8–23)
CO2: 16 mmol/L — ABNORMAL LOW (ref 22–32)
Calcium: 9.5 mg/dL (ref 8.9–10.3)
Chloride: 107 mmol/L (ref 98–111)
Creatinine, Ser: 1.17 mg/dL (ref 0.61–1.24)
GFR, Estimated: >60 mL/min (ref >60)
Glucose, Bld: 142 mg/dL — ABNORMAL HIGH (ref 70–99)
Potassium: 3 mmol/L — ABNORMAL LOW (ref 3.5–5.1)
Sodium: 139 mmol/L (ref 135–145)
Total Bilirubin: 0.3 mg/dL (ref 0.3–1.2)
Total Protein: 7.1 g/dL (ref 6.5–8.1)

## 2020-10-20 LAB — CBC WITH DIFFERENTIAL/PLATELET
Abs Immature Granulocytes: 0.11 10*3/uL — ABNORMAL HIGH (ref 0.00–0.07)
Abs Immature Granulocytes: 0.04 10*3/uL (ref 0.00–0.07)
Basophils Absolute: 0 10*3/uL (ref 0.0–0.1)
Basophils Absolute: 0 10*3/uL (ref 0.0–0.1)
Basophils Relative: 0 %
Basophils Relative: 0 %
Eosinophils Absolute: 0.1 10*3/uL (ref 0.0–0.5)
Eosinophils Absolute: 0 10*3/uL (ref 0.0–0.5)
Eosinophils Relative: 1 %
Eosinophils Relative: 0 %
HCT: 21.7 % — ABNORMAL LOW (ref 39.0–52.0)
HCT: 21.1 % — ABNORMAL LOW (ref 39.0–52.0)
Hemoglobin: 5.8 g/dL — CL (ref 13.0–17.0)
Hemoglobin: 5.8 g/dL — CL (ref 13.0–17.0)
Immature Granulocytes: 2 %
Immature Granulocytes: 1 %
Lymphocytes Relative: 12 %
Lymphocytes Relative: 10 %
Lymphs Abs: 0.8 10*3/uL (ref 0.7–4.0)
Lymphs Abs: 0.7 10*3/uL (ref 0.7–4.0)
MCH: 20.9 pg — ABNORMAL LOW (ref 26.0–34.0)
MCH: 20.7 pg — ABNORMAL LOW (ref 26.0–34.0)
MCHC: 26.7 g/dL — ABNORMAL LOW (ref 30.0–36.0)
MCHC: 27.5 g/dL — ABNORMAL LOW (ref 30.0–36.0)
MCV: 78.3 fL — ABNORMAL LOW (ref 80.0–100.0)
MCV: 75.4 fL — ABNORMAL LOW (ref 80.0–100.0)
Monocytes Absolute: 0.8 10*3/uL (ref 0.1–1.0)
Monocytes Absolute: 0.4 10*3/uL (ref 0.1–1.0)
Monocytes Relative: 11 %
Monocytes Relative: 5 %
Neutro Abs: 5.4 10*3/uL (ref 1.7–7.7)
Neutro Abs: 6.7 10*3/uL (ref 1.7–7.7)
Neutrophils Relative %: 74 %
Neutrophils Relative %: 84 %
Platelets: 114 10*3/uL — ABNORMAL LOW (ref 150–400)
Platelets: 105 10*3/uL — ABNORMAL LOW (ref 150–400)
RBC: 2.77 MIL/uL — ABNORMAL LOW (ref 4.22–5.81)
RBC: 2.8 MIL/uL — ABNORMAL LOW (ref 4.22–5.81)
RDW: 20.7 % — ABNORMAL HIGH (ref 11.5–15.5)
RDW: 20.6 % — ABNORMAL HIGH (ref 11.5–15.5)
WBC: 7.1 10*3/uL (ref 4.0–10.5)
WBC: 7.8 10*3/uL (ref 4.0–10.5)
nRBC: 1.5 % — ABNORMAL HIGH (ref 0.0–0.2)
nRBC: 0.3 % — ABNORMAL HIGH (ref 0.0–0.2)

## 2020-10-20 LAB — CBG MONITORING, ED: Glucose-Capillary: 70 mg/dL (ref 70–99)

## 2020-10-20 LAB — I-STAT CHEM 8, ED
BUN: 18 mg/dL (ref 8–23)
Calcium, Ion: 1.26 mmol/L (ref 1.15–1.40)
Chloride: 108 mmol/L (ref 98–111)
Creatinine, Ser: 1.2 mg/dL (ref 0.61–1.24)
Glucose, Bld: 47 mg/dL — ABNORMAL LOW (ref 70–99)
HCT: 24 % — ABNORMAL LOW (ref 39.0–52.0)
Hemoglobin: 8.2 g/dL — ABNORMAL LOW (ref 13.0–17.0)
Potassium: 2.8 mmol/L — ABNORMAL LOW (ref 3.5–5.1)
Sodium: 143 mmol/L (ref 135–145)
TCO2: 14 mmol/L — ABNORMAL LOW (ref 22–32)

## 2020-10-20 LAB — AMMONIA: Ammonia: 30 umol/L (ref 9–35)

## 2020-10-20 LAB — LACTIC ACID, PLASMA: Lactic Acid, Venous: 9.6 mmol/L (ref 0.5–1.9)

## 2020-10-20 LAB — CK: Total CK: 134 U/L (ref 49–397)

## 2020-10-20 LAB — ETHANOL: Alcohol, Ethyl (B): 42 mg/dL — ABNORMAL HIGH (ref <10)

## 2020-10-20 LAB — MAGNESIUM: Magnesium: 2.1 mg/dL (ref 1.7–2.4)

## 2020-10-20 MED ORDER — TETANUS-DIPHTH-ACELL PERTUSSIS 5-2.5-18.5 LF-MCG/0.5 IM SUSY
0.5000 mL | PREFILLED_SYRINGE | Freq: Once | INTRAMUSCULAR | Status: AC
  Administered 2020-10-21: 0.5 mL via INTRAMUSCULAR
  Filled 2020-10-20: qty 0.5

## 2020-10-20 MED ORDER — POTASSIUM CHLORIDE CRYS ER 20 MEQ PO TBCR
40.0000 meq | EXTENDED_RELEASE_TABLET | Freq: Once | ORAL | Status: AC
  Administered 2020-10-20: 40 meq via ORAL
  Filled 2020-10-20: qty 2

## 2020-10-20 MED ORDER — SODIUM CHLORIDE 0.9% FLUSH
3.0000 mL | Freq: Two times a day (BID) | INTRAVENOUS | Status: DC
  Administered 2020-10-21 – 2020-10-24 (×7): 3 mL via INTRAVENOUS

## 2020-10-20 MED ORDER — SODIUM CHLORIDE 0.9 % IV SOLN
10.0000 mL/h | Freq: Once | INTRAVENOUS | Status: AC
  Administered 2020-10-21: 10 mL/h via INTRAVENOUS

## 2020-10-20 MED ORDER — LACTATED RINGERS IV BOLUS
1000.0000 mL | Freq: Once | INTRAVENOUS | Status: AC
  Administered 2020-10-20: 1000 mL via INTRAVENOUS

## 2020-10-20 MED ORDER — LEVETIRACETAM IN NACL 1000 MG/100ML IV SOLN
1000.0000 mg | Freq: Once | INTRAVENOUS | Status: AC
  Administered 2020-10-20: 1000 mg via INTRAVENOUS
  Filled 2020-10-20: qty 100

## 2020-10-20 MED ORDER — SODIUM CHLORIDE 0.9 % IV BOLUS: 1000.0000 mL | Freq: Once | INTRAVENOUS | Status: DC

## 2020-10-20 MED ORDER — SODIUM CHLORIDE 0.9% FLUSH: 3.0000 mL | INTRAVENOUS | Status: DC | PRN

## 2020-10-20 MED ORDER — DEXTROSE 50 % IV SOLN
50.0000 mL | Freq: Once | INTRAVENOUS | Status: AC
  Administered 2020-10-20: 50 mL via INTRAVENOUS
  Filled 2020-10-20: qty 50

## 2020-10-20 MED ORDER — LIDOCAINE-EPINEPHRINE (PF) 2 %-1:200000 IJ SOLN
10.0000 mL | Freq: Once | INTRAMUSCULAR | Status: AC
  Administered 2020-10-20: 10 mL
  Filled 2020-10-20: qty 20

## 2020-10-20 MED ORDER — LIDOCAINE-EPINEPHRINE-TETRACAINE (LET) TOPICAL GEL
3.0000 mL | Freq: Once | TOPICAL | Status: DC
  Filled 2020-10-20: qty 3

## 2020-10-20 NOTE — ED Notes (Addendum)
Date and time results received: 10/20/20 2058 (use smartphrase ".now" to insert current time)  Test:HGB Critical Value: 5.8 Name of Provider Notified: Criss Alvine MD Orders Received? Or Actions Taken?:  Waiting on orders

## 2020-10-20 NOTE — ED Provider Notes (Signed)
[facility] DEPT Provider Note   CSN: [id number] Arrival date & time: 10/20/20  1931  LEVEL 5 CAVEAT - ALTERED MENTAL STATUS  History Chief Complaint  Patient presents with  . Fall    Patient found on the ground in the street, small lac to the right eyebrow.    Mason Hart is a 64 y.o. male.  HPI 64 year old male presents after a fall.  He was found by bystanders laying in the street.  EMS reports positive EtOH.  Patient is currently disoriented and not able to provide a good history.  He endorsed drinking half of a pint of beer tonight.  Otherwise he is found to have a small laceration lateral to his right eyebrow.  He has no complaints otherwise.  Unclear how long he was on the ground or what caused him to fall.    Past Medical History:  Diagnosis Date  . Alcohol abuse   . Hypertension   . Hypothyroidism   . Noncompliance with medication regimen   . RBBB (right bundle branch block)   . Seizures [facility])     Patient Active Problem List   Diagnosis Date Noted  . Hepatic cirrhosis ([place]) 05/26/2020  . Seizure disorder ([place]) 04/29/2020  . Gait abnormality 04/29/2020  . Acute respiratory failure ([place]) 04/13/2020  . Alcohol intoxication with delirium ([place]) 06/04/2019  . Pressure injury of skin 03/19/2019  . Breakthrough seizure ([place]) 03/18/2019  . Edema of left lower extremity 03/18/2019  . Chronic anemia 03/18/2019  . Anemia, normocytic normochromic 12/05/2018  . Endotracheally intubated 12/05/2018  . Hypothermia 12/05/2018  . Hyponatremia 12/05/2018  . Postictal state ([place]) 12/05/2018  . Sinusitis 12/05/2018  . Abnormal CXR 12/05/2018  . Hypotension 12/05/2018  . Bradycardia 12/05/2018  . ARF (acute renal failure) ([place]) 11/14/2018  . Clavicle fracture 11/14/2018  . Hypokalemia 06/24/2017  . Hypoglycemia 06/24/2017  . Alcohol use 06/24/2017  . Seizure ([place]) 06/24/2017  . Noncompliance with medication regimen   . Seizures ([place])      Past Surgical History:  Procedure Laterality Date  . NO PAST SURGERIES         Family History  Problem Relation Age of Onset  . Heart disease Mother   . Other Father        unsure of medical history  . Cancer Neg Hx   . Diabetes Neg Hx   . Stroke Neg Hx     Social History   Tobacco Use  . Smoking status: Never Smoker  . Smokeless tobacco: Never Used  Vaping Use  . Vaping Use: Never used  Substance Use Topics  . Alcohol use: Yes    Comment: 1-2 beers per day- 42 oz, occasional liquor  . Drug use: No    Home Medications Prior to Admission medications   Medication Sig Start Date End Date Taking? Authorizing Provider  acetaminophen (TYLENOL) 325 MG tablet Take 325 mg by mouth 2 (two) times daily as needed for mild pain. Patient not taking: Reported on 05/26/2020    [provider]  EUTHYROX 25 MCG tablet TAKE 1 TABLET BY MOUTH DAILY AT 6 AM Patient not taking: Reported on 05/26/2020 05/14/20   Gildardo Pounds, NP  folic acid (FOLVITE) 1 MG tablet Take 1 tablet (1 mg total) by mouth daily. Patient not taking: Reported on 05/26/2020 04/13/20   Gildardo Pounds, NP  levETIRAcetam (KEPPRA) 1000 MG tablet Take 1 tablet (1,000 mg total) by mouth 2 (two) times daily. Last fill! Must  keep appointment. Patient not taking: Reported on 05/26/2020 04/29/20 07/23/21  Marcial Pacas, MD  PEG-KCl-NaCl-NaSulf-Na Asc-C (PLENVU) 140 g SOLR Take 1 kit by mouth as directed. BIN: [id number]; GROUP: [id number]; PCNPryor Curia; ID: [id number]; SHOULD BE $50 05/26/20   Zehr, Laban Emperor, PA-C    Allergies    Patient has no known allergies.  Review of Systems   Review of Systems  Unable to perform ROS: Mental status change    Physical Exam Updated Vital Signs BP 110/77   Pulse 64   Temp (!) 89.1 F (31.7 C) (Rectal)   Resp 12   SpO2 100%   Physical Exam Vitals and nursing note reviewed.  Constitutional:      General: He is not in acute distress.    Appearance: He is well-developed. He is  not ill-appearing or diaphoretic.  HENT:     Head: Normocephalic.      Right Ear: External ear normal.     Left Ear: External ear normal.     Nose: Nose normal.  Eyes:     General:        Right eye: No discharge.        Left eye: No discharge.     Pupils: Pupils are equal, round, and reactive to light.  Cardiovascular:     Rate and Rhythm: Normal rate and regular rhythm.     Heart sounds: Normal heart sounds.  Pulmonary:     Effort: Pulmonary effort is normal.     Breath sounds: Normal breath sounds.  Abdominal:     General: There is no distension.     Palpations: Abdomen is soft.     Tenderness: There is no abdominal tenderness.  Musculoskeletal:     Cervical back: Normal range of motion and neck supple.     Comments: No obvious extremity/joint trauma or back trauma  Skin:    General: Skin is warm and dry.  Neurological:     Mental Status: He is alert. He is disoriented.     Comments: CN 3-12 grossly intact. 5/5 strength in all 4 extremities.  Psychiatric:        Mood and Affect: Mood is not anxious.     ED Results / Procedures / Treatments   Labs (all labs ordered are listed, but only abnormal results are displayed) Labs Reviewed  CBC WITH DIFFERENTIAL/PLATELET - Abnormal; Notable for the following components:      Result Value   RBC 2.77 (*)    Hemoglobin 5.8 (*)    HCT 21.7 (*)    MCV 78.3 (*)    MCH 20.9 (*)    MCHC 26.7 (*)    RDW 20.7 (*)    Platelets 114 (*)    nRBC 1.5 (*)    Abs Immature Granulocytes 0.11 (*)    All other components within normal limits  COMPREHENSIVE METABOLIC PANEL - Abnormal; Notable for the following components:   Potassium 2.6 (*)    CO2 10 (*)    Glucose, Bld 55 (*)    Creatinine, Ser 1.31 (*)    Albumin 3.3 (*)    Anion gap 19 (*)    All other components within normal limits  ETHANOL - Abnormal; Notable for the following components:   Alcohol, Ethyl (B) 42 (*)    All other components within normal limits  CBC WITH  DIFFERENTIAL/PLATELET - Abnormal; Notable for the following components:   RBC 2.80 (*)    Hemoglobin 5.8 (*)    HCT 21.1 (*)  MCV 75.4 (*)    MCH 20.7 (*)    MCHC 27.5 (*)    RDW 20.6 (*)    Platelets 105 (*)    nRBC 0.3 (*)    All other components within normal limits  I-STAT CHEM 8, ED - Abnormal; Notable for the following components:   Potassium 2.8 (*)    Glucose, Bld 47 (*)    TCO2 14 (*)    Hemoglobin 8.2 (*)    HCT 24.0 (*)    All other components within normal limits  CULTURE, BLOOD (ROUTINE X 2)  CULTURE, BLOOD (ROUTINE X 2)  RESP PANEL BY RT-PCR (FLU A&B, COVID) ARPGX2  CK  URINALYSIS, ROUTINE W REFLEX MICROSCOPIC  RAPID URINE DRUG SCREEN, HOSP PERFORMED  COMPREHENSIVE METABOLIC PANEL  MAGNESIUM  AMMONIA  LACTIC ACID, PLASMA  LACTIC ACID, PLASMA  CBG MONITORING, ED  CBG MONITORING, ED  CBG MONITORING, ED  CBG MONITORING, ED  POC OCCULT BLOOD, ED  TYPE AND SCREEN  PREPARE RBC (CROSSMATCH)    EKG None  Radiology No results found.  Procedures .Marland KitchenLaceration Repair  Date/Time: 10/20/2020 11:07 PM Performed by: Sherwood Gambler, MD Authorized by: Sherwood Gambler, MD   Anesthesia (see MAR for exact dosages):    Anesthesia method:  Local infiltration   Local anesthetic:  Lidocaine 2% WITH epi Laceration details:    Location:  Face   Face location:  R eyebrow   Length (cm):  2 Repair type:    Repair type:  Simple Treatment:    Area cleansed with:  Saline   Amount of cleaning:  Standard   Irrigation solution:  Sterile saline   Irrigation method:  Syringe Skin repair:    Repair method:  Sutures   Suture size:  5-0   Suture material:  Plain gut   Suture technique:  Simple interrupted   Number of sutures:  3 Approximation:    Approximation:  Close Post-procedure details:    Dressing:  Open (no dressing)   Patient tolerance of procedure:  Tolerated well, no immediate complications  .Critical Care Performed by: Sherwood Gambler, MD Authorized  by: Sherwood Gambler, MD   Critical care provider statement:    Critical care time (minutes):  45   Critical care time was exclusive of:  Separately billable procedures and treating other patients   Critical care was necessary to treat or prevent imminent or life-threatening deterioration of the following conditions:  Circulatory failure and CNS failure or compromise   Critical care was time spent personally by me on the following activities:  Discussions with consultants, evaluation of patient's response to treatment, examination of patient, ordering and performing treatments and interventions, ordering and review of laboratory studies, ordering and review of radiographic studies, pulse oximetry, re-evaluation of patient's condition, obtaining history from patient or surrogate and review of old charts   (including critical care time)  Medications Ordered in ED Medications  lidocaine-EPINEPHrine-tetracaine (LET) topical gel (has no administration in time range)  sodium chloride flush (NS) 0.9 % injection 3 mL (has no administration in time range)  sodium chloride flush (NS) 0.9 % injection 3 mL (has no administration in time range)  levETIRAcetam (KEPPRA) IVPB 1000 mg/100 mL premix (1,000 mg Intravenous New Bag/Given 10/20/20 2342)  0.9 %  sodium chloride infusion (has no administration in time range)  lidocaine-EPINEPHrine (XYLOCAINE W/EPI) 2 %-1:200000 (PF) injection 10 mL (10 mLs Infiltration Given 10/20/20 2310)  potassium chloride SA (KLOR-CON) CR tablet 40 mEq (40 mEq Oral Given 10/20/20 2319)  dextrose 50 % solution 50 mL (50 mLs Intravenous Given 10/20/20 2310)  lactated ringers bolus 1,000 mL (1,000 mLs Intravenous New Bag/Given 10/20/20 2314)    ED Course  I have reviewed the triage vital signs and the nursing notes.  Pertinent labs & imaging results that were available during my care of the patient were reviewed by me and considered in my medical decision making (see chart for  details).    MDM Rules/Calculators/A&P                          Patient presents after an unwitnessed fall.  Unclear how long he was on the ground for.  There was initial delay in some of his work-up as he was found to have numerous bedbugs.  The patient initially found to have significant anemia of 5.8.  Unclear if this was a true value as it was drawn off his EMS IV so this was redrawn.  It is confirmed and so he will be started on blood.  Unfortunately the patient is still altered so the history is quite limited.  He does seem to be less altered and tells me he thinks he had a seizure which she deftly has a history of.  Will give IV Keppra.  Currently is pending imaging given his fall and head injury.  Laceration was repaired as above.  He was eventually found to have significant hypothermia to a temperature of 31.7.  I think this is environmental given how cold it is outside.  Could be partially related to the hypoglycemia which has been treated by both EMS as well as [place] in the emergency department.  He continues to protect his airway and is awake but just confused.  He will need admission after his CT scans. Care to Dr. Betsey Holiday.  Final Clinical Impression(s) / ED Diagnoses Final diagnoses:  Fall, initial encounter  Facial laceration, initial encounter  Hypothermia, initial encounter  Anemia, unspecified type    Rx / DC Orders ED Discharge Orders    None       Sherwood Gambler, MD 10/20/20 2351

## 2020-10-20 NOTE — ED Notes (Signed)
Bear hugger and warm blankets applied.

## 2020-10-20 NOTE — ED Triage Notes (Signed)
Patient found on the ground in the street by a bystander who called 911.  Patient admits to ETOH, small lac to the right eyebrow, a/o x2.

## 2020-10-20 NOTE — ED Notes (Signed)
Date and time results received: 10/20/20 2354 (use smartphrase ".now" to insert current time)  Test: Lactic Acid Critical Value:9.6  Name of Provider Notified: Criss Alvine MD Orders Received? Or Actions Taken?:waiting on orders

## 2020-10-21 ENCOUNTER — Encounter (HOSPITAL_COMMUNITY): Payer: Self-pay

## 2020-10-21 ENCOUNTER — Other Ambulatory Visit: Payer: Self-pay

## 2020-10-21 ENCOUNTER — Emergency Department (HOSPITAL_COMMUNITY): Payer: Medicare Other

## 2020-10-21 ENCOUNTER — Inpatient Hospital Stay (HOSPITAL_COMMUNITY): Payer: Medicare Other

## 2020-10-21 DIAGNOSIS — E876 Hypokalemia: Secondary | ICD-10-CM | POA: Diagnosis not present

## 2020-10-21 DIAGNOSIS — E162 Hypoglycemia, unspecified: Secondary | ICD-10-CM

## 2020-10-21 DIAGNOSIS — T68XXXA Hypothermia, initial encounter: Secondary | ICD-10-CM | POA: Diagnosis present

## 2020-10-21 DIAGNOSIS — G40909 Epilepsy, unspecified, not intractable, without status epilepticus: Secondary | ICD-10-CM | POA: Diagnosis not present

## 2020-10-21 DIAGNOSIS — I10 Essential (primary) hypertension: Secondary | ICD-10-CM | POA: Diagnosis not present

## 2020-10-21 DIAGNOSIS — R001 Bradycardia, unspecified: Secondary | ICD-10-CM

## 2020-10-21 DIAGNOSIS — I4510 Unspecified right bundle-branch block: Secondary | ICD-10-CM | POA: Diagnosis not present

## 2020-10-21 DIAGNOSIS — E872 Acidosis, unspecified: Secondary | ICD-10-CM

## 2020-10-21 DIAGNOSIS — E878 Other disorders of electrolyte and fluid balance, not elsewhere classified: Secondary | ICD-10-CM | POA: Diagnosis not present

## 2020-10-21 DIAGNOSIS — I959 Hypotension, unspecified: Secondary | ICD-10-CM | POA: Diagnosis not present

## 2020-10-21 DIAGNOSIS — R4182 Altered mental status, unspecified: Secondary | ICD-10-CM | POA: Diagnosis present

## 2020-10-21 DIAGNOSIS — D61818 Other pancytopenia: Secondary | ICD-10-CM | POA: Diagnosis not present

## 2020-10-21 DIAGNOSIS — Z79899 Other long term (current) drug therapy: Secondary | ICD-10-CM | POA: Diagnosis not present

## 2020-10-21 DIAGNOSIS — F10239 Alcohol dependence with withdrawal, unspecified: Secondary | ICD-10-CM | POA: Diagnosis not present

## 2020-10-21 DIAGNOSIS — D509 Iron deficiency anemia, unspecified: Secondary | ICD-10-CM | POA: Diagnosis not present

## 2020-10-21 DIAGNOSIS — X31XXXA Exposure to excessive natural cold, initial encounter: Secondary | ICD-10-CM | POA: Diagnosis not present

## 2020-10-21 DIAGNOSIS — Z23 Encounter for immunization: Secondary | ICD-10-CM | POA: Diagnosis present

## 2020-10-21 DIAGNOSIS — S01111A Laceration without foreign body of right eyelid and periocular area, initial encounter: Secondary | ICD-10-CM | POA: Diagnosis not present

## 2020-10-21 DIAGNOSIS — W19XXXA Unspecified fall, initial encounter: Secondary | ICD-10-CM | POA: Diagnosis not present

## 2020-10-21 DIAGNOSIS — E039 Hypothyroidism, unspecified: Secondary | ICD-10-CM | POA: Diagnosis not present

## 2020-10-21 DIAGNOSIS — Z8249 Family history of ischemic heart disease and other diseases of the circulatory system: Secondary | ICD-10-CM | POA: Diagnosis not present

## 2020-10-21 DIAGNOSIS — D696 Thrombocytopenia, unspecified: Secondary | ICD-10-CM | POA: Diagnosis not present

## 2020-10-21 DIAGNOSIS — G9341 Metabolic encephalopathy: Secondary | ICD-10-CM | POA: Diagnosis not present

## 2020-10-21 DIAGNOSIS — Z20822 Contact with and (suspected) exposure to covid-19: Secondary | ICD-10-CM | POA: Diagnosis not present

## 2020-10-21 DIAGNOSIS — Z9114 Patient's other noncompliance with medication regimen: Secondary | ICD-10-CM | POA: Diagnosis not present

## 2020-10-21 DIAGNOSIS — D6959 Other secondary thrombocytopenia: Secondary | ICD-10-CM | POA: Diagnosis not present

## 2020-10-21 DIAGNOSIS — Y92410 Unspecified street and highway as the place of occurrence of the external cause: Secondary | ICD-10-CM | POA: Diagnosis not present

## 2020-10-21 LAB — IRON AND TIBC
Iron: 19 ug/dL — ABNORMAL LOW (ref 45–182)
Saturation Ratios: 5 % — ABNORMAL LOW (ref 17.9–39.5)
TIBC: 376 ug/dL (ref 250–450)
UIBC: 357 ug/dL

## 2020-10-21 LAB — GLUCOSE, CAPILLARY
Glucose-Capillary: 102 mg/dL — ABNORMAL HIGH (ref 70–99)
Glucose-Capillary: 65 mg/dL — ABNORMAL LOW (ref 70–99)
Glucose-Capillary: 107 mg/dL — ABNORMAL HIGH (ref 70–99)
Glucose-Capillary: 184 mg/dL — ABNORMAL HIGH (ref 70–99)
Glucose-Capillary: 76 mg/dL (ref 70–99)
Glucose-Capillary: 107 mg/dL — ABNORMAL HIGH (ref 70–99)
Glucose-Capillary: 116 mg/dL — ABNORMAL HIGH (ref 70–99)

## 2020-10-21 LAB — CBC
HCT: 19.1 % — ABNORMAL LOW (ref 39.0–52.0)
Hemoglobin: 5.4 g/dL — CL (ref 13.0–17.0)
MCH: 20.5 pg — ABNORMAL LOW (ref 26.0–34.0)
MCHC: 28.3 g/dL — ABNORMAL LOW (ref 30.0–36.0)
MCV: 72.6 fL — ABNORMAL LOW (ref 80.0–100.0)
Platelets: 103 10*3/uL — ABNORMAL LOW (ref 150–400)
RBC: 2.63 MIL/uL — ABNORMAL LOW (ref 4.22–5.81)
RDW: 21.1 % — ABNORMAL HIGH (ref 11.5–15.5)
WBC: 5.4 10*3/uL (ref 4.0–10.5)
nRBC: 0 % (ref 0.0–0.2)

## 2020-10-21 LAB — COMPREHENSIVE METABOLIC PANEL
ALT: 25 U/L (ref 0–44)
AST: 28 U/L (ref 15–41)
Albumin: 3.3 g/dL — ABNORMAL LOW (ref 3.5–5.0)
Alkaline Phosphatase: 61 U/L (ref 38–126)
Anion gap: 19 — ABNORMAL HIGH (ref 5–15)
BUN: 18 mg/dL (ref 8–23)
CO2: 10 mmol/L — ABNORMAL LOW (ref 22–32)
Calcium: 9.2 mg/dL (ref 8.9–10.3)
Chloride: 110 mmol/L (ref 98–111)
Creatinine, Ser: 1.31 mg/dL — ABNORMAL HIGH (ref 0.61–1.24)
GFR, Estimated: >60 mL/min (ref >60)
Glucose, Bld: 55 mg/dL — ABNORMAL LOW (ref 70–99)
Potassium: 2.6 mmol/L — CL (ref 3.5–5.1)
Sodium: 139 mmol/L (ref 135–145)
Total Bilirubin: 0.6 mg/dL (ref 0.3–1.2)
Total Protein: 7 g/dL (ref 6.5–8.1)

## 2020-10-21 LAB — FERRITIN: Ferritin: 5 ng/mL — ABNORMAL LOW (ref 24–336)

## 2020-10-21 LAB — CBG MONITORING, ED
Glucose-Capillary: 131 mg/dL — ABNORMAL HIGH (ref 70–99)
Glucose-Capillary: 66 mg/dL — ABNORMAL LOW (ref 70–99)

## 2020-10-21 LAB — VITAMIN B12: Vitamin B-12: 637 pg/mL (ref 180–914)

## 2020-10-21 LAB — RETICULOCYTES
Immature Retic Fract: 34.8 % — ABNORMAL HIGH (ref 2.3–15.9)
RBC.: 2.59 MIL/uL — ABNORMAL LOW (ref 4.22–5.81)
Retic Count, Absolute: 55.2 10*3/uL (ref 19.0–186.0)
Retic Ct Pct: 2.1 % (ref 0.4–3.1)

## 2020-10-21 LAB — RESP PANEL BY RT-PCR (FLU A&B, COVID) ARPGX2
Influenza A by PCR: NEGATIVE
Influenza B by PCR: NEGATIVE
SARS Coronavirus 2 by RT PCR: NEGATIVE

## 2020-10-21 LAB — BASIC METABOLIC PANEL
Anion gap: 6 (ref 5–15)
BUN: 15 mg/dL (ref 8–23)
CO2: 23 mmol/L (ref 22–32)
Calcium: 8.9 mg/dL (ref 8.9–10.3)
Chloride: 108 mmol/L (ref 98–111)
Creatinine, Ser: 1.01 mg/dL (ref 0.61–1.24)
GFR, Estimated: >60 mL/min (ref >60)
Glucose, Bld: 101 mg/dL — ABNORMAL HIGH (ref 70–99)
Potassium: 3.3 mmol/L — ABNORMAL LOW (ref 3.5–5.1)
Sodium: 137 mmol/L (ref 135–145)

## 2020-10-21 LAB — HIV ANTIBODY (ROUTINE TESTING W REFLEX): HIV Screen 4th Generation wRfx: NONREACTIVE

## 2020-10-21 LAB — PREPARE RBC (CROSSMATCH)

## 2020-10-21 LAB — PHOSPHORUS: Phosphorus: 3.1 mg/dL (ref 2.5–4.6)

## 2020-10-21 LAB — PROCALCITONIN: Procalcitonin: <0.1 ng/mL

## 2020-10-21 LAB — TSH: TSH: 2.302 u[IU]/mL (ref 0.350–4.500)

## 2020-10-21 LAB — LACTIC ACID, PLASMA
Lactic Acid, Venous: 5.9 mmol/L (ref 0.5–1.9)
Lactic Acid, Venous: 1.9 mmol/L (ref 0.5–1.9)

## 2020-10-21 LAB — ABO/RH: ABO/RH(D): O POS

## 2020-10-21 LAB — HEMOGLOBIN AND HEMATOCRIT, BLOOD
HCT: 26.9 % — ABNORMAL LOW (ref 39.0–52.0)
Hemoglobin: 8.3 g/dL — ABNORMAL LOW (ref 13.0–17.0)

## 2020-10-21 LAB — CORTISOL-AM, BLOOD: Cortisol - AM: 14.4 ug/dL (ref 6.7–22.6)

## 2020-10-21 LAB — FOLATE: Folate: 7.6 ng/mL (ref >5.9)

## 2020-10-21 LAB — MRSA PCR SCREENING: MRSA by PCR: NEGATIVE

## 2020-10-21 MED ORDER — FOLIC ACID 1 MG PO TABS
1.0000 mg | ORAL_TABLET | Freq: Every day | ORAL | Status: DC
  Administered 2020-10-21 – 2020-10-24 (×4): 1 mg via ORAL
  Filled 2020-10-21 (×4): qty 1

## 2020-10-21 MED ORDER — ADULT MULTIVITAMIN W/MINERALS CH
1.0000 | ORAL_TABLET | Freq: Every day | ORAL | Status: DC
  Administered 2020-10-21 – 2020-10-24 (×4): 1 via ORAL
  Filled 2020-10-21 (×4): qty 1

## 2020-10-21 MED ORDER — LORAZEPAM 2 MG/ML IJ SOLN: 1.0000 mg | INTRAMUSCULAR | Status: AC | PRN

## 2020-10-21 MED ORDER — POTASSIUM CHLORIDE 10 MEQ/100ML IV SOLN
10.0000 meq | INTRAVENOUS | Status: AC
  Administered 2020-10-21 (×3): 10 meq via INTRAVENOUS
  Filled 2020-10-21 (×3): qty 100

## 2020-10-21 MED ORDER — LACTATED RINGERS IV BOLUS
2000.0000 mL | Freq: Once | INTRAVENOUS | Status: AC
  Administered 2020-10-21: 2000 mL via INTRAVENOUS

## 2020-10-21 MED ORDER — ACETAMINOPHEN 650 MG RE SUPP: 650.0000 mg | Freq: Four times a day (QID) | RECTAL | Status: DC | PRN

## 2020-10-21 MED ORDER — CHLORHEXIDINE GLUCONATE CLOTH 2 % EX PADS
6.0000 | MEDICATED_PAD | Freq: Every day | CUTANEOUS | Status: DC
  Administered 2020-10-23 – 2020-10-24 (×2): 6 via TOPICAL

## 2020-10-21 MED ORDER — SODIUM CHLORIDE 0.9 % IV SOLN: INTRAVENOUS | Status: AC

## 2020-10-21 MED ORDER — THIAMINE HCL 100 MG/ML IJ SOLN: 100.0000 mg | Freq: Every day | INTRAMUSCULAR | Status: DC

## 2020-10-21 MED ORDER — SODIUM CHLORIDE 0.9 % IV BOLUS
1000.0000 mL | Freq: Once | INTRAVENOUS | Status: AC
  Administered 2020-10-21: 1000 mL via INTRAVENOUS

## 2020-10-21 MED ORDER — THIAMINE HCL 100 MG PO TABS
100.0000 mg | ORAL_TABLET | Freq: Every day | ORAL | Status: DC
  Administered 2020-10-21 – 2020-10-22 (×2): 100 mg via ORAL
  Filled 2020-10-21 (×2): qty 1

## 2020-10-21 MED ORDER — SODIUM BICARBONATE 8.4 % IV SOLN
50.0000 meq | Freq: Once | INTRAVENOUS | Status: AC
  Administered 2020-10-21: 50 meq via INTRAVENOUS
  Filled 2020-10-21: qty 50

## 2020-10-21 MED ORDER — ORAL CARE MOUTH RINSE
15.0000 mL | Freq: Two times a day (BID) | OROMUCOSAL | Status: DC
  Administered 2020-10-21 – 2020-10-23 (×6): 15 mL via OROMUCOSAL

## 2020-10-21 MED ORDER — ACETAMINOPHEN 325 MG PO TABS: 650.0000 mg | ORAL_TABLET | Freq: Four times a day (QID) | ORAL | Status: DC | PRN

## 2020-10-21 MED ORDER — LACTATED RINGERS IV BOLUS
1000.0000 mL | Freq: Once | INTRAVENOUS | Status: AC
  Administered 2020-10-21: 1000 mL via INTRAVENOUS

## 2020-10-21 MED ORDER — POTASSIUM CHLORIDE CRYS ER 20 MEQ PO TBCR
40.0000 meq | EXTENDED_RELEASE_TABLET | Freq: Two times a day (BID) | ORAL | Status: AC
  Administered 2020-10-21 – 2020-10-22 (×4): 40 meq via ORAL
  Filled 2020-10-21 (×4): qty 2

## 2020-10-21 MED ORDER — SODIUM CHLORIDE 0.9% IV SOLUTION: Freq: Once | INTRAVENOUS | Status: AC

## 2020-10-21 MED ORDER — IOHEXOL 300 MG/ML  SOLN
100.0000 mL | Freq: Once | INTRAMUSCULAR | Status: AC | PRN
  Administered 2020-10-21: 100 mL via INTRAVENOUS

## 2020-10-21 MED ORDER — LORAZEPAM 1 MG PO TABS: 1.0000 mg | ORAL_TABLET | ORAL | Status: AC | PRN

## 2020-10-21 MED ORDER — LEVETIRACETAM 500 MG PO TABS
1000.0000 mg | ORAL_TABLET | Freq: Two times a day (BID) | ORAL | Status: DC
  Administered 2020-10-21 – 2020-10-24 (×7): 1000 mg via ORAL
  Filled 2020-10-21 (×7): qty 2

## 2020-10-21 NOTE — Progress Notes (Signed)
Hypoglycemic Event  CBG:65  Treatment: 4 oz of orange juice given  Symptoms: none  Follow-up CBG: Time:1143 CBG Result:107  Possible Reasons for Event: Patient had not eaten since admission     [name]

## 2020-10-21 NOTE — H&P (Signed)
History and Physical    Mason Hart YOV:[id number] DOB: [birth year] DOA: 10/20/2020  PCP: Gildardo Pounds, NP  Chief Complaint: Fall, altered mental status  HPI: Mason Hart is a 64 y.o. male with medical history significant of alcohol abuse, hypertension, hypothyroidism, recurrent seizures, noncompliant with medications presented to the ED via EMS after he was found on the ground on the street by a bystander who called 911.  Unclear how long he was on the ground.  Patient admitted to drinking alcohol and had a small laceration to his right eyebrow.  AAO x2 on arrival to the ED.  Found to have numerous bedbugs.  History limited as patient appears confused.  He thinks he had a seizure and fell outside on the lawn.  Reports drinking half a pint of liquor and additional alcoholic beverages every day.  He has no complaints.  Denies cough, shortness of breath, chest pain, nausea, vomiting, abdominal pain, diarrhea, or dysuria.  No additional history could be obtained from him.  ED Course: Hypothermic with temperature 89.1 F.  Bear hugger and warm blankets applied.  Slightly bradycardic with heart rate in the upper 50s to 60s.  Not tachypneic.  Not hypotensive.  Not hypoxic.  WBC 7.1, hemoglobin 5.8, hematocrit 21.7, platelet 114K.  Repeat CBC confirmed anemia with hemoglobin 5.8.  Sodium 139, potassium 2.6, chloride 110, bicarb 10, BUN 18, creatinine 1.3, glucose 55, anion gap 19.  LFTs normal.  UDS pending.  Blood ethanol level 42.  CK normal.  Magnesium level normal.  Ammonia level normal.  Lactic acid significantly elevated at 9.6.  Blood culture x2 pending.  SARS-CoV-2 PCR test negative.  Influenza panel negative.  Hypoglycemic with CBG as low as 47.   Chest x-ray showing no active disease.  CT head negative for acute intracranial abnormality.  CT C-spine negative for acute abnormality.  Patient was given IV dextrose, oral potassium 40 mEq, Tdap booster, 2 L LR boluses, and IV Keppra 1000 mg.   2 units PRBCs ordered.  Repeat labs showing sodium 139, potassium 3.0, chloride 107, bicarb 16, BUN 20, creatinine 1.1, glucose 142, anion gap 16.  Repeat lactic acid 5.9.  ED provider discussed the case with on-call critical care MD who did not feel that the patient needed to be admitted to the ICU.  Review of Systems:  All systems reviewed and apart from history of presenting illness, are negative.  Past Medical History:  Diagnosis Date  . Alcohol abuse   . Hypertension   . Hypothyroidism   . Noncompliance with medication regimen   . RBBB (right bundle branch block)   . Seizures ([place])     Past Surgical History:  Procedure Laterality Date  . NO PAST SURGERIES       reports that he has never smoked. He has never used smokeless tobacco. He reports current alcohol use. He reports that he does not use drugs.  No Known Allergies  Family History  Problem Relation Age of Onset  . Heart disease Mother   . Other Father        unsure of medical history  . Cancer Neg Hx   . Diabetes Neg Hx   . Stroke Neg Hx     Prior to Admission medications   Medication Sig Start Date End Date Taking? Authorizing Provider  acetaminophen (TYLENOL) 325 MG tablet Take 325 mg by mouth 2 (two) times daily as needed for mild pain. Patient not taking: Reported on 05/26/2020    [provider]  EUTHYROX 25 MCG tablet TAKE 1 TABLET BY MOUTH DAILY AT 6 AM Patient not taking: Reported on 05/26/2020 05/14/20   Gildardo Pounds, NP  folic acid (FOLVITE) 1 MG tablet Take 1 tablet (1 mg total) by mouth daily. Patient not taking: Reported on 05/26/2020 04/13/20   Gildardo Pounds, NP  levETIRAcetam (KEPPRA) 1000 MG tablet Take 1 tablet (1,000 mg total) by mouth 2 (two) times daily. Last fill! Must keep appointment. Patient not taking: Reported on 05/26/2020 04/29/20 07/23/21  Marcial Pacas, MD  PEG-KCl-NaCl-NaSulf-Na Asc-C (PLENVU) 140 g SOLR Take 1 kit by mouth as directed. BIN: [id number]; GROUP: [id number]; PCNPryor Curia; ID: [id number]; SHOULD BE $50 05/26/20   ZehrLaban Emperor, PA-C    Physical Exam: Vitals:   10/21/20 0330 10/21/20 0345 10/21/20 0400 10/21/20 0415  BP: 108/63 113/68 117/73 122/69  Pulse: (!) 58 61 62 68  Resp: (!) '8 17 14 14  ' Temp:      TempSrc:      SpO2: 100% 100% 100% 100%    Physical Exam Constitutional:      General: He is not in acute distress. HENT:     Head: Normocephalic and atraumatic.  Eyes:     Extraocular Movements: Extraocular movements intact.     Conjunctiva/sclera: Conjunctivae normal.  Cardiovascular:     Rate and Rhythm: Normal rate and regular rhythm.     Pulses: Normal pulses.  Pulmonary:     Effort: Pulmonary effort is normal. No respiratory distress.     Breath sounds: Normal breath sounds. No wheezing or rales.  Abdominal:     General: Bowel sounds are normal. There is no distension.     Palpations: Abdomen is soft.     Tenderness: There is no abdominal tenderness.  Musculoskeletal:        General: No swelling or tenderness.     Cervical back: Normal range of motion and neck supple.  Skin:    General: Skin is warm and dry.  Neurological:     Mental Status: He is alert.     Comments: Oriented to person and place only Appears confused Moving all extremities on command, no focal weakness     Labs on Admission: I have personally reviewed following labs and imaging studies  CBC: Recent Labs  Lab 10/20/20 2002 10/20/20 2125 10/20/20 2306  WBC 7.1  --  7.8  NEUTROABS 5.4  --  6.7  HGB 5.8* 8.2* 5.8*  HCT 21.7* 24.0* 21.1*  MCV 78.3*  --  75.4*  PLT 114*  --  211*   Basic Metabolic Panel: Recent Labs  Lab 10/20/20 2002 10/20/20 2125 10/20/20 2306  NA 139 143 139  K 2.6* 2.8* 3.0*  CL 110 108 107  CO2 10*  --  16*  GLUCOSE 55* 47* 142*  BUN '18 18 20  ' CREATININE 1.31* 1.20 1.17  CALCIUM 9.2  --  9.5  MG  --   --  2.1   GFR: CrCl cannot be calculated (Unknown ideal weight.). Liver Function Tests: Recent Labs  Lab  10/20/20 2002 10/20/20 2306  AST 28 34  ALT 25 28  ALKPHOS 61 63  BILITOT 0.6 0.3  PROT 7.0 7.1  ALBUMIN 3.3* 3.4*   No results for input(s): LIPASE, AMYLASE in the last 168 hours. Recent Labs  Lab 10/20/20 2306  AMMONIA 30   Coagulation Profile: No results for input(s): INR, PROTIME in the last 168 hours. Cardiac Enzymes: Recent Labs  Lab 10/20/20 2016  CKTOTAL  134   BNP (last 3 results) No results for input(s): PROBNP in the last 8760 hours. HbA1C: No results for input(s): HGBA1C in the last 72 hours. CBG: Recent Labs  Lab 10/20/20 1947 10/20/20 2327  GLUCAP 70 131*   Lipid Profile: No results for input(s): CHOL, HDL, LDLCALC, TRIG, CHOLHDL, LDLDIRECT in the last 72 hours. Thyroid Function Tests: No results for input(s): TSH, T4TOTAL, FREET4, T3FREE, THYROIDAB in the last 72 hours. Anemia Panel: No results for input(s): VITAMINB12, FOLATE, FERRITIN, TIBC, IRON, RETICCTPCT in the last 72 hours. Urine analysis:    Component Value Date/Time   COLORURINE YELLOW 03/18/2019 2122   APPEARANCEUR CLEAR 03/18/2019 2122   LABSPEC 1.008 03/18/2019 2122   PHURINE 5.0 03/18/2019 2122   GLUCOSEU NEGATIVE 03/18/2019 2122   HGBUR NEGATIVE 03/18/2019 2122   BILIRUBINUR NEGATIVE 03/18/2019 2122   [place] NEGATIVE 03/18/2019 2122   PROTEINUR NEGATIVE 03/18/2019 2122   UROBILINOGEN 1.0 04/04/2012 1449   NITRITE NEGATIVE 03/18/2019 2122   LEUKOCYTESUR NEGATIVE 03/18/2019 2122    Radiological Exams on Admission: CT Head Wo Contrast  Result Date: 10/21/2020 CLINICAL DATA:  Trauma. Patient was found on the ground by bystander. Alcohol use. Small laceration to the right eyebrow. EXAM: CT HEAD WITHOUT CONTRAST CT CERVICAL SPINE WITHOUT CONTRAST TECHNIQUE: Multidetector CT imaging of the head and cervical spine was performed following the standard protocol without intravenous contrast. Multiplanar CT image reconstructions of the cervical spine were also generated. COMPARISON:   CT head 03/19/2019.  MRI brain 03/19/2019. FINDINGS: CT HEAD FINDINGS Brain: Mild diffuse cerebral atrophy. Mild ventricular dilatation consistent with central atrophy. Low-attenuation changes in the deep white matter consistent with small vessel ischemia. No mass-effect or midline shift. Asymmetric prominence of the CSF space anteriorly and to the left of the medulla. This may represent asymmetric CSF space or possibly ependymoma. No change since prior study. Otherwise, no abnormal extra-axial fluid collections. Gray-white matter junctions are distinct. Basal cisterns are not effaced. No acute intracranial hemorrhage. Vascular: No hyperdense vessel or unexpected calcification. Skull: Normal. Negative for fracture or focal lesion. Sinuses/Orbits: Mucosal thickening throughout the paranasal sinuses. Opacification of the left sphenoid sinus. Mastoid air cells are clear. Other: None. CT CERVICAL SPINE FINDINGS Alignment: Normal alignment of the cervical spine and facet joints. C1-2 articulation appears intact. Skull base and vertebrae: Skull base appears intact. No vertebral compression deformities. No focal bone lesion or bone destruction. Soft tissues and spinal canal: No prevertebral soft tissue swelling. No focal soft tissue mass or paraspinal infiltration. Visualized lymph nodes are not pathologically enlarged. Disc levels: Diffuse degenerative change throughout the cervical spine with narrowed interspaces and prominent osteophyte formation. Degenerative changes in the facet joints. Uncovertebral and facet joint spurring causes bone encroachment upon multiple neural foramina bilaterally. Prominent degenerative changes also at C1-2 and at the tip of the clivus. Upper chest: Visualized lung apices are clear. Other: None. IMPRESSION: 1. No acute intracranial abnormalities. Chronic atrophy and small vessel ischemic changes. Inflammatory changes in the paranasal sinuses. 2. Normal alignment of the cervical spine.  Diffuse degenerative changes. No acute displaced fractures identified. Electronically Signed   By: Lucienne Capers M.D.   On: 10/21/2020 00:54   CT Cervical Spine Wo Contrast  Result Date: 10/21/2020 CLINICAL DATA:  Trauma. Patient was found on the ground by bystander. Alcohol use. Small laceration to the right eyebrow. EXAM: CT HEAD WITHOUT CONTRAST CT CERVICAL SPINE WITHOUT CONTRAST TECHNIQUE: Multidetector CT imaging of the head and cervical spine was performed following the standard protocol  without intravenous contrast. Multiplanar CT image reconstructions of the cervical spine were also generated. COMPARISON:  CT head 03/19/2019.  MRI brain 03/19/2019. FINDINGS: CT HEAD FINDINGS Brain: Mild diffuse cerebral atrophy. Mild ventricular dilatation consistent with central atrophy. Low-attenuation changes in the deep white matter consistent with small vessel ischemia. No mass-effect or midline shift. Asymmetric prominence of the CSF space anteriorly and to the left of the medulla. This may represent asymmetric CSF space or possibly ependymoma. No change since prior study. Otherwise, no abnormal extra-axial fluid collections. Gray-white matter junctions are distinct. Basal cisterns are not effaced. No acute intracranial hemorrhage. Vascular: No hyperdense vessel or unexpected calcification. Skull: Normal. Negative for fracture or focal lesion. Sinuses/Orbits: Mucosal thickening throughout the paranasal sinuses. Opacification of the left sphenoid sinus. Mastoid air cells are clear. Other: None. CT CERVICAL SPINE FINDINGS Alignment: Normal alignment of the cervical spine and facet joints. C1-2 articulation appears intact. Skull base and vertebrae: Skull base appears intact. No vertebral compression deformities. No focal bone lesion or bone destruction. Soft tissues and spinal canal: No prevertebral soft tissue swelling. No focal soft tissue mass or paraspinal infiltration. Visualized lymph nodes are not  pathologically enlarged. Disc levels: Diffuse degenerative change throughout the cervical spine with narrowed interspaces and prominent osteophyte formation. Degenerative changes in the facet joints. Uncovertebral and facet joint spurring causes bone encroachment upon multiple neural foramina bilaterally. Prominent degenerative changes also at C1-2 and at the tip of the clivus. Upper chest: Visualized lung apices are clear. Other: None. IMPRESSION: 1. No acute intracranial abnormalities. Chronic atrophy and small vessel ischemic changes. Inflammatory changes in the paranasal sinuses. 2. Normal alignment of the cervical spine. Diffuse degenerative changes. No acute displaced fractures identified. Electronically Signed   By: Lucienne Capers M.D.   On: 10/21/2020 00:54   DG Chest Portable 1 View  Result Date: 10/21/2020 CLINICAL DATA:  Altered mental status. Found outside with low body temperature. EXAM: PORTABLE CHEST 1 VIEW COMPARISON:  03/18/2019 FINDINGS: Patient rotation limits examination. Heart size and pulmonary vascularity appear normal. Lungs are clear. No pleural effusions. No pneumothorax. Old left clavicular fracture. IMPRESSION: No active disease. Electronically Signed   By: Lucienne Capers M.D.   On: 10/21/2020 00:23    EKG: Pending at this time.  Assessment/Plan Principal Problem:   Hypothermia Active Problems:   Hypoglycemia   Bradycardia   Lactic acidosis   Metabolic acidosis   Moderate hypothermia: Likely due to environmental exposure to cold temperature.  Patient was found on the street by a bystander and it is unclear how long he was on the ground.  Hypothermic with temperature 89.1 F on arrival.  Bear hugger and warm blankets applied, temperature improved to 91.2 F.  Sepsis/infectious etiology less likely given no leukocytosis, tachycardia, or hypotension.  Chest x-ray not suggestive of pneumonia. -Admit to stepdown unit.  Continue Bair hugger and warm blankets.  Monitor  temperature very closely.  Check TSH and cortisol levels.  Lactic acidosis: Lactic acid significantly elevated at 9.6 on initial labs, improved to 5.9 after fluid boluses.  Possibly due to severe dehydration vs ?seizure. Sepsis/underlying infectious etiology less likely given no leukocytosis, tachycardia, or hypotension.  SARS-CoV-2 PCR test negative.  Influenza panel negative.  Chest x-ray not suggestive of pneumonia. -Continue IV fluids and trend lactate.  UA to rule out UTI.  Blood culture x2 pending.  Check procalcitonin level.  High anion gap metabolic acidosis: Suspect due to significant lactic acidosis.  Bicarb 10 and anion gap 19 on initial  labs.  Now improving after fluid boluses - bicarb 16, anion gap 16. -Give sodium bicarbonate 1 amp.  Continue IV fluids and monitor BMP.  Hypoglycemia: Likely due to poor oral intake in the setting of alcohol abuse.  CBG as low as 47.  Patient was given IV dextrose and blood glucose improved to 142. -Encourage p.o. intake, continue CBG checks every 2 hours.  Bradycardia: Bradycardic with heart rate in the upper 50s to 60s.  Likely due to moderate hypothermia.  Not on any AV nodal blocking agent. -Cardiac monitoring, EKG, check TSH level  Seizure disorder: Patient may have possibly had a seizure as he was found down on the street laying on the ground for an unknown amount of time.  Likely precipitated by hypoglycemia in setting of medication noncompliance.  He is supposed to be taking Keppra 1000 mg twice daily but per review of previous neurology note, patient has had recurrent seizures due to medication noncompliance.  He did have a significantly elevated lactate on labs which could be seen after seizure but CK was normal. -Patient was given IV Keppra 1000 mg in the ED.  Will continue his home dose of Keppra 1000 mg twice daily.  Order EEG.  Seizure precautions.  Alcohol abuse: Blood ethanol level 42.  Patient has a history of drinking heavily on a  regular basis and is at risk for withdrawal during this hospitalization. -CIWA protocol; Ativan as needed.  Thiamine, folate, and multivitamin.  Hypokalemia: Potassium 2.6.  Magnesium level normal.  Likely due to poor oral intake in setting of alcohol abuse. -Cardiac monitoring, EKG.  Replete potassium and continue to monitor.  Acute metabolic encephalopathy: Likely multifactorial from moderate hypothermia, hypoglycemia/metabolic derangements, alcohol use, and possible seizure.  Head CT negative for acute intracranial abnormality.  Neuro exam nonfocal.  Ammonia level normal. -Management as mentioned above and continue to monitor.  UA and UDS pending.  Acute on chronic anemia: Hemoglobin 5.8, hematocrit 21.1, MCV 75.4.  Hemoglobin ranging between 8-11.6 on previous labs.  Unclear whether he has had previous work-up/colonoscopy. -Type and screen.  2 units PRBCs ordered in the ED.  Follow-up posttransfusion H&H.  Order FOBT and anemia panel.  Will likely need GI consultation for further work-up during this hospitalization.  Mild thrombocytopenia: Platelet count 114K.  Possibly due to chronic alcohol abuse.   -Continue to monitor  Hypothyroidism: History of medication noncompliance. -Check TSH  ?Sinusitis: CT showing inflammatory changes in the paranasal sinuses.  Unable to obtain any history from the patient at this time.  DVT prophylaxis: SCDs Code Status: Full code Family Communication: No family available at this time. Disposition Plan: Status is: Inpatient  Remains inpatient appropriate because:Persistent severe electrolyte disturbances, Altered mental status, Unsafe d/c plan, IV treatments appropriate due to intensity of illness or inability to take PO and Inpatient level of care appropriate due to severity of illness   Dispo: The patient is from: Home              Anticipated d/c is to: SNF              Anticipated d/c date is: > 3 days              Patient currently is not  medically stable to d/c.  The medical decision making on this patient was of high complexity and the patient is at high risk for clinical deterioration, therefore this is a level 3 visit.  Shela Leff MD [facility]  If 7PM-7AM, please  contact night-coverage [email or web address]  10/21/2020, 4:26 AM

## 2020-10-21 NOTE — ED Notes (Signed)
Admitting provider at the bedside to evaluate the patient at this time. 

## 2020-10-21 NOTE — ED Notes (Signed)
Patient has been given orange juice, apple sauce, graham crackers and cheese.

## 2020-10-21 NOTE — ED Notes (Signed)
Date and time results received: 10/21/20 0240 (use smartphrase ".now" to insert current time)  Test: lactic acid  Critical Value: 5.9  Name of Provider Notified: Blinda Leatherwood, MD   Orders Received? Or Actions Taken?: Actions Taken: Provider notified

## 2020-10-21 NOTE — ED Notes (Signed)
Bed bugs has been found all over the patient's clothing and body, in a variety of sizes and stages. Patient's clothing and bed linen has been removed and double bagged.

## 2020-10-21 NOTE — Progress Notes (Addendum)
Admitted early morning hours.  See H&P done by nighttime hospitalist.  64 year old gentleman with alcohol abuse, hypertension, hypothyroidism and history of seizures and noncompliance with medications found in his yard outside the house where he was hypothermic with temperature 89.1, bradycardic with heart rate 50, potassium 2.6 and lactic acid of 9, blood sugars of less than 40.  Resuscitated in the ER and improving.  Plan: Continue warming, clinically improving Lactic acid has normalized, no source of infection found.  Probably secondary to seizure. All-time seizure precautions, fall precautions, CIWA protocol for alcohol withdrawal. Loaded with Keppra, continue Keppra Aggressively replace electrolytes and monitor levels. Remains in the stepdown unit today.  Addendum, examined patient with reported low blood pressure.  Patient has been alert awake, he ate his lunch.  Denies any complaints.  He is pleasant and alert.  He is oriented x2-3. No clinical evidence of any bleeding.  Whole body examination does not reveal any clinical evidence of tenderness or hematoma. Patient consented for blood transfusion, however he was not able to understand all benefits and side effects.  With blood pressures less than 90 and hemoglobin 5.4, signed emergent consent as attending physician to improve his hemodynamics and outcome. 2 L IV fluid bolus.  2 units of PRBC now. Continue close monitoring. His abdominal exam is benign, however will check CT scan of the abdomen pelvis to rule out any retroperitoneal hematoma.  He had normal bowel movement earlier today.

## 2020-10-22 ENCOUNTER — Encounter (HOSPITAL_COMMUNITY): Payer: Self-pay | Admitting: Internal Medicine

## 2020-10-22 DIAGNOSIS — T68XXXA Hypothermia, initial encounter: Secondary | ICD-10-CM | POA: Diagnosis not present

## 2020-10-22 LAB — CBC WITH DIFFERENTIAL/PLATELET
Abs Immature Granulocytes: 0 10*3/uL (ref 0.00–0.07)
Abs Immature Granulocytes: 0.02 10*3/uL (ref 0.00–0.07)
Abs Immature Granulocytes: 0.01 10*3/uL (ref 0.00–0.07)
Basophils Absolute: 0 10*3/uL (ref 0.0–0.1)
Basophils Absolute: 0 10*3/uL (ref 0.0–0.1)
Basophils Absolute: 0 10*3/uL (ref 0.0–0.1)
Basophils Relative: 0 %
Basophils Relative: 0 %
Basophils Relative: 0 %
Eosinophils Absolute: 0.1 10*3/uL (ref 0.0–0.5)
Eosinophils Absolute: 0.2 10*3/uL (ref 0.0–0.5)
Eosinophils Absolute: 0.2 10*3/uL (ref 0.0–0.5)
Eosinophils Relative: 3 %
Eosinophils Relative: 2 %
Eosinophils Relative: 3 %
HCT: 23.5 % — ABNORMAL LOW (ref 39.0–52.0)
HCT: 22.2 % — ABNORMAL LOW (ref 39.0–52.0)
HCT: 23.5 % — ABNORMAL LOW (ref 39.0–52.0)
Hemoglobin: 7.2 g/dL — ABNORMAL LOW (ref 13.0–17.0)
Hemoglobin: 6.6 g/dL — CL (ref 13.0–17.0)
Hemoglobin: 7 g/dL — ABNORMAL LOW (ref 13.0–17.0)
Immature Granulocytes: 0 %
Immature Granulocytes: 0 %
Immature Granulocytes: 0 %
Lymphocytes Relative: 43 %
Lymphocytes Relative: 18 %
Lymphocytes Relative: 26 %
Lymphs Abs: 0.9 10*3/uL (ref 0.7–4.0)
Lymphs Abs: 1.3 10*3/uL (ref 0.7–4.0)
Lymphs Abs: 1.6 10*3/uL (ref 0.7–4.0)
MCH: 22.2 pg — ABNORMAL LOW (ref 26.0–34.0)
MCH: 22.4 pg — ABNORMAL LOW (ref 26.0–34.0)
MCH: 22.2 pg — ABNORMAL LOW (ref 26.0–34.0)
MCHC: 30.6 g/dL (ref 30.0–36.0)
MCHC: 29.7 g/dL — ABNORMAL LOW (ref 30.0–36.0)
MCHC: 29.8 g/dL — ABNORMAL LOW (ref 30.0–36.0)
MCV: 72.5 fL — ABNORMAL LOW (ref 80.0–100.0)
MCV: 75.3 fL — ABNORMAL LOW (ref 80.0–100.0)
MCV: 74.6 fL — ABNORMAL LOW (ref 80.0–100.0)
Monocytes Absolute: 0.2 10*3/uL (ref 0.1–1.0)
Monocytes Absolute: 0.8 10*3/uL (ref 0.1–1.0)
Monocytes Absolute: 0.9 10*3/uL (ref 0.1–1.0)
Monocytes Relative: 9 %
Monocytes Relative: 11 %
Monocytes Relative: 15 %
Neutro Abs: 0.9 10*3/uL — ABNORMAL LOW (ref 1.7–7.7)
Neutro Abs: 5 10*3/uL (ref 1.7–7.7)
Neutro Abs: 3.4 10*3/uL (ref 1.7–7.7)
Neutrophils Relative %: 45 %
Neutrophils Relative %: 69 %
Neutrophils Relative %: 56 %
Platelets: <5 10*3/uL — CL (ref 150–400)
Platelets: 77 10*3/uL — ABNORMAL LOW (ref 150–400)
Platelets: 114 10*3/uL — ABNORMAL LOW (ref 150–400)
RBC: 3.24 MIL/uL — ABNORMAL LOW (ref 4.22–5.81)
RBC: 2.95 MIL/uL — ABNORMAL LOW (ref 4.22–5.81)
RBC: 3.15 MIL/uL — ABNORMAL LOW (ref 4.22–5.81)
RDW: 19.9 % — ABNORMAL HIGH (ref 11.5–15.5)
RDW: 20.7 % — ABNORMAL HIGH (ref 11.5–15.5)
RDW: 21.4 % — ABNORMAL HIGH (ref 11.5–15.5)
WBC: 2 10*3/uL — ABNORMAL LOW (ref 4.0–10.5)
WBC: 7.4 10*3/uL (ref 4.0–10.5)
WBC: 6.2 10*3/uL (ref 4.0–10.5)
nRBC: 0 % (ref 0.0–0.2)
nRBC: 0.3 % — ABNORMAL HIGH (ref 0.0–0.2)
nRBC: 0 % (ref 0.0–0.2)

## 2020-10-22 LAB — TYPE AND SCREEN
ABO/RH(D): O POS
Antibody Screen: NEGATIVE
Unit division: 0
Unit division: 0

## 2020-10-22 LAB — BPAM RBC
Blood Product Expiration Date: 202112252359
Blood Product Expiration Date: 202112252359
ISSUE DATE / TIME: 202111251203
ISSUE DATE / TIME: 202111251606
Unit Type and Rh: 5100
Unit Type and Rh: 5100

## 2020-10-22 LAB — MAGNESIUM: Magnesium: 1.8 mg/dL (ref 1.7–2.4)

## 2020-10-22 LAB — COMPREHENSIVE METABOLIC PANEL
ALT: 31 U/L (ref 0–44)
AST: 37 U/L (ref 15–41)
Albumin: 3.3 g/dL — ABNORMAL LOW (ref 3.5–5.0)
Alkaline Phosphatase: 74 U/L (ref 38–126)
Anion gap: 7 (ref 5–15)
BUN: 15 mg/dL (ref 8–23)
CO2: 22 mmol/L (ref 22–32)
Calcium: 9 mg/dL (ref 8.9–10.3)
Chloride: 108 mmol/L (ref 98–111)
Creatinine, Ser: 1.05 mg/dL (ref 0.61–1.24)
GFR, Estimated: >60 mL/min (ref >60)
Glucose, Bld: 80 mg/dL (ref 70–99)
Potassium: 4.5 mmol/L (ref 3.5–5.1)
Sodium: 137 mmol/L (ref 135–145)
Total Bilirubin: 0.6 mg/dL (ref 0.3–1.2)
Total Protein: 7.2 g/dL (ref 6.5–8.1)

## 2020-10-22 LAB — GLUCOSE, CAPILLARY
Glucose-Capillary: 123 mg/dL — ABNORMAL HIGH (ref 70–99)
Glucose-Capillary: 85 mg/dL (ref 70–99)
Glucose-Capillary: 87 mg/dL (ref 70–99)
Glucose-Capillary: 91 mg/dL (ref 70–99)
Glucose-Capillary: 95 mg/dL (ref 70–99)
Glucose-Capillary: 96 mg/dL (ref 70–99)
Glucose-Capillary: 98 mg/dL (ref 70–99)
Glucose-Capillary: 99 mg/dL (ref 70–99)

## 2020-10-22 LAB — RETICULOCYTES
Immature Retic Fract: 19.3 % — ABNORMAL HIGH (ref 2.3–15.9)
RBC.: 2.99 MIL/uL — ABNORMAL LOW (ref 4.22–5.81)
Retic Count, Absolute: 51.7 10*3/uL (ref 19.0–186.0)
Retic Ct Pct: 1.7 % (ref 0.4–3.1)

## 2020-10-22 LAB — PHOSPHORUS: Phosphorus: 3.1 mg/dL (ref 2.5–4.6)

## 2020-10-22 LAB — LACTATE DEHYDROGENASE: LDH: 144 U/L (ref 98–192)

## 2020-10-22 LAB — CK: Total CK: 193 U/L (ref 49–397)

## 2020-10-22 MED ORDER — SODIUM CHLORIDE 0.9 % IV SOLN
510.0000 mg | Freq: Once | INTRAVENOUS | Status: AC
  Administered 2020-10-22: 510 mg via INTRAVENOUS
  Filled 2020-10-22: qty 510

## 2020-10-22 MED ORDER — THIAMINE HCL 100 MG/ML IJ SOLN
500.0000 mg | Freq: Three times a day (TID) | INTRAVENOUS | Status: DC
  Administered 2020-10-22 – 2020-10-24 (×6): 500 mg via INTRAVENOUS
  Filled 2020-10-22 (×8): qty 5

## 2020-10-22 MED ORDER — SODIUM CHLORIDE 0.9% IV SOLUTION: Freq: Once | INTRAVENOUS | Status: AC

## 2020-10-22 MED ORDER — MAGNESIUM SULFATE 2 GM/50ML IV SOLN
2.0000 g | Freq: Once | INTRAVENOUS | Status: AC
  Administered 2020-10-22: 2 g via INTRAVENOUS
  Filled 2020-10-22: qty 50

## 2020-10-22 NOTE — Progress Notes (Signed)
CRITICAL VALUE ALERT  Critical Value: Platelet <5  Date & Time Notied: 10/22/20 0340  Provider Notified: Bruna Potter via Amion  Orders Received/Actions taken: received order to transfuse 2 units platelets

## 2020-10-22 NOTE — Progress Notes (Signed)
PROGRESS NOTE    Mason Hart  JOA:[id number] DOB: [birth year] DOA: 10/20/2020 PCP: [name], NP    Brief Narrative:  64 year old gentleman with alcoholism, hypertension, hypothyroidism, seizure disorder and noncompliance to medications found in his yard outside the house in chilling cold weather.  Brought by EMS to the ER.  In the emergency room he was found with temperature 89 degrees, bradycardic, potassium 2.6, lactic acid 9, blood sugars less than 40. Aggressively resuscitated, temperature controlled and admitted to stepdown unit.   Assessment & Plan:   Principal Problem:   Hypothermia Active Problems:   Hypoglycemia   Bradycardia   Lactic acidosis   Metabolic acidosis  Hypothermia/bradycardia/hypoglycemia/lactic acidosis with metabolic acidosis/hypotension Hypothermia probably related to environmental exposure.  Improved. Lactic acid normalized with fluid resuscitation, no evidence of localized infection. Blood sugars improved after eating regular diet, will continue to encourage high-calorie and high carbohydrate diet. Start mobilizing.  Seizure disorder: Suspect recurrent seizure as patient also admits to it.  Loaded with Keppra.  Currently remains on Keppra 1000 mg twice a day.  No more seizures in the hospital.  All-time seizure precautions.  Fall precautions.  Pancytopenia: Anemia, cause unknown.  Baseline hemoglobin on records are 10.  Probably has chronic iron deficiency anemia.  B12 folic acid normal.  No evidence of hemolysis.  LDH and reticulocyte's are normal.  Bilirubin is normal. Hemoglobin 5.4-2 units of PRBC transfusion-8-6.8.  Currently no evidence of bleeding.  Skeletal survey and CT scan of the abdomen pelvis with no evidence of hematoma.  We will continue to monitor and symptomatically treat. Transfuse PRBC only for hemodynamic instability and hemoglobin less than 6 today. We will transfuse 1 unit of Feraheme today.  Thrombocytopenia: Probably  transient more myelosuppression due to seizure.  Has chronic thrombocytopenia related to chronic liver disease. Platelets 113-5 overnight-1 unit of platelets given-77.  We will continue to monitor. Transfuse platelets only for hemodynamic instability, bleeding or platelets of less than 10.  Alcoholism: On CIWA scale.  No evidence of delirium tremens.  Will treat with high-dose thiamine and seizure precautions.  Start mobilizing today. With patient's confusion, will treat with high-dose thiamine for 5 days. We will give a dose of IV iron today.  Hypomagnesemia: Aggressively replace electrolytes.  We will keep potassium more than 4 and magnesium more than 2.   DVT prophylaxis: SCDs Start: 10/21/20 0430   Code Status: Full code Family Communication: the number for son and spouse did not pick up the phone, called and updated patient's friend Mardene Celeste.  Disposition Plan: Status is: Inpatient  Remains inpatient appropriate because:Inpatient level of care appropriate due to severity of illness   Dispo: The patient is from: Home              Anticipated d/c is to: Home              Anticipated d/c date is: 3 days              Patient currently is not medically stable to d/c.         Consultants:   Hematology, curbside consultation regarding pancytopenia  Procedures:   None  Antimicrobials:   None   Subjective: Patient seen and examined.  Patient himself denies any complaints.  Denies any chest pain.  Denies any shortness of breath.  He was trying to eat.  He was slightly impulsive and tremulous. Overnight events noted.  Platelets were 5 and received 1 unit of platelets. "I think I had a  seizure"   Objective: Vitals:   10/22/20 0802 10/22/20 0829 10/22/20 0900 10/22/20 1000  BP: 112/60  (!) 121/56 (!) 95/57  Pulse: 63  88 71  Resp: 15  17 (!) 4  Temp: 97.8 F (36.6 C) 97.9 F (36.6 C) 97.9 F (36.6 C)   TempSrc: Axillary Axillary Axillary   SpO2: 100%  100% 100%    Weight:   84 kg   Height:   6\' 1"  (1.854 m)     Intake/Output Summary (Last 24 hours) at 10/22/2020 1141 Last data filed at 10/22/2020 1000 Gross per 24 hour  Intake 1087.25 ml  Output 1910 ml  Net -822.75 ml   Filed Weights   10/22/20 0900  Weight: 84 kg    Examination:  General exam: Appears calm and comfortable  Chronically sick looking, slightly tremulous.  Not in any distress.  On room air. Respiratory system: Clear to auscultation. Respiratory effort normal.  No added sounds. Cardiovascular system: S1 & S2 heard, RRR. No JVD, murmurs, rubs, gallops or clicks. No pedal edema. Gastrointestinal system: Abdomen is nondistended, soft and nontender. No organomegaly or masses felt. Normal bowel sounds heard. Central nervous system: Alert and oriented x2.  Moves all extremities. Extremities: Symmetric 5 x 5 power. Skin: No rashes, lesions or ulcers Psychiatry: Judgement and insight appear compromised.  Mood & affect flat.    Data Reviewed: I have personally reviewed following labs and imaging studies  CBC: Recent Labs  Lab 10/20/20 2002 10/20/20 2125 10/20/20 2306 10/21/20 0722 10/21/20 2118 10/22/20 0314 10/22/20 1013  WBC 7.1  --  7.8 5.4  --  2.0* 7.4  NEUTROABS 5.4  --  6.7  --   --  0.9* 5.0  HGB 5.8*   < > 5.8* 5.4* 8.3* 7.2* 6.6*  HCT 21.7*   < > 21.1* 19.1* 26.9* 23.5* 22.2*  MCV 78.3*  --  75.4* 72.6*  --  72.5* 75.3*  PLT 114*  --  105* 103*  --  <5* 77*   < > = values in this interval not displayed.   Basic Metabolic Panel: Recent Labs  Lab 10/20/20 2002 10/20/20 2125 10/20/20 2306 10/21/20 0722 10/22/20 0314  NA 139 143 139 137 137  K 2.6* 2.8* 3.0* 3.3* 4.5  CL 110 108 107 108 108  CO2 10*  --  16* 23 22  GLUCOSE 55* 47* 142* 101* 80  BUN 18 18 20 15 15   CREATININE 1.31* 1.20 1.17 1.01 1.05  CALCIUM 9.2  --  9.5 8.9 9.0  MG  --   --  2.1  --  1.8  PHOS  --   --   --  3.1 3.1   GFR: Estimated Creatinine Clearance: 78.2 mL/min (by C-G  formula based on SCr of 1.05 mg/dL). Liver Function Tests: Recent Labs  Lab 10/20/20 2002 10/20/20 2306 10/22/20 0314  AST 28 34 37  ALT 25 28 31   ALKPHOS 61 63 74  BILITOT 0.6 0.3 0.6  PROT 7.0 7.1 7.2  ALBUMIN 3.3* 3.4* 3.3*   No results for input(s): LIPASE, AMYLASE in the last 168 hours. Recent Labs  Lab 10/20/20 2306  AMMONIA 30   Coagulation Profile: No results for input(s): INR, PROTIME in the last 168 hours. Cardiac Enzymes: Recent Labs  Lab 10/20/20 2016 10/22/20 0314  CKTOTAL 134 193   BNP (last 3 results) No results for input(s): PROBNP in the last 8760 hours. HbA1C: No results for input(s): HGBA1C in the last 72 hours. CBG:  Recent Labs  Lab 10/21/20 2004 10/22/20 0000 10/22/20 0204 10/22/20 0729 10/22/20 0944  GLUCAP 116* 123* 98 99 91   Lipid Profile: No results for input(s): CHOL, HDL, LDLCALC, TRIG, CHOLHDL, LDLDIRECT in the last 72 hours. Thyroid Function Tests: Recent Labs    10/21/20 0722  TSH 2.302   Anemia Panel: Recent Labs    10/21/20 0722 10/22/20 1013  VITAMINB12 637  --   FOLATE 7.6  --   FERRITIN 5*  --   TIBC 376  --   IRON 19*  --   RETICCTPCT 2.1 1.7   Sepsis Labs: Recent Labs  Lab 10/20/20 2306 10/21/20 0205 10/21/20 0722  PROCALCITON  --   --  <0.10  LATICACIDVEN 9.6* 5.9* 1.9    Recent Results (from the past 240 hour(s))  Culture, blood (routine x 2)     Status: None (Preliminary result)   Collection Time: 10/20/20 11:07 PM   Specimen: BLOOD  Result Value Ref Range Status   Specimen Description   Final    BLOOD SITE NOT SPECIFIED Performed at [facility], [address], [place], [place] [address]    Special Requests   Final    BOTTLES DRAWN AEROBIC AND ANAEROBIC Blood Culture adequate volume Performed at [facility], [address], [place], [place] [address]    Culture PENDING  Incomplete   Report Status PENDING  Incomplete  Culture, blood (routine x 2)     Status: None  (Preliminary result)   Collection Time: 10/20/20 11:07 PM   Specimen: BLOOD  Result Value Ref Range Status   Specimen Description   Final    BLOOD SITE NOT SPECIFIED Performed at [facility], [address], [place], [place] [address]    Special Requests   Final    BOTTLES DRAWN AEROBIC AND ANAEROBIC Blood Culture adequate volume Performed at [facility], [address], [place], [place] [address]    Culture PENDING  Incomplete   Report Status PENDING  Incomplete  Resp Panel by RT-PCR (Flu A&B, Covid) Nasopharyngeal Swab     Status: None   Collection Time: 10/20/20 11:08 PM   Specimen: Nasopharyngeal Swab; Nasopharyngeal(NP) swabs in vial transport medium  Result Value Ref Range Status   SARS Coronavirus 2 by RT PCR NEGATIVE NEGATIVE Final    Comment: (NOTE) SARS-CoV-2 target nucleic acids are NOT DETECTED.  The SARS-CoV-2 RNA is generally detectable in upper respiratory specimens during the acute phase of infection. The lowest concentration of SARS-CoV-2 viral copies this assay can detect is 138 copies/mL. A negative result does not preclude SARS-Cov-2 infection and should not be used as the sole basis for treatment or other patient management decisions. A negative result may occur with  improper specimen collection/handling, submission of specimen other than nasopharyngeal swab, presence of viral mutation(s) within the areas targeted by this assay, and inadequate number of viral copies(<138 copies/mL). A negative result must be combined with clinical observations, patient history, and epidemiological information. The expected result is Negative.  Fact Sheet for Patients:  BloggerCourse.com  Fact Sheet for Healthcare Providers:  SeriousBroker.it  This test is no t yet approved or cleared by the [place] FDA and  has been authorized for detection and/or diagnosis of SARS-CoV-2 by FDA under an  Emergency Use Authorization (EUA). This EUA will remain  in effect (meaning this test can be used) for the duration of the COVID-19 declaration under Section 564(b)(1) of the Act, 21 U.S.C.section 360bbb-3(b)(1),  unless the authorization is terminated  or revoked sooner.       Influenza A by PCR NEGATIVE NEGATIVE Final   Influenza B by PCR NEGATIVE NEGATIVE Final    Comment: (NOTE) The Xpert Xpress SARS-CoV-2/FLU/RSV plus assay is intended as an aid in the diagnosis of influenza from Nasopharyngeal swab specimens and should not be used as a sole basis for treatment. Nasal washings and aspirates are unacceptable for Xpert Xpress SARS-CoV-2/FLU/RSV testing.  Fact Sheet for Patients: BloggerCourse.com  Fact Sheet for Healthcare Providers: SeriousBroker.it  This test is not yet approved or cleared by the [place] FDA and has been authorized for detection and/or diagnosis of SARS-CoV-2 by FDA under an Emergency Use Authorization (EUA). This EUA will remain in effect (meaning this test can be used) for the duration of the COVID-19 declaration under Section 564(b)(1) of the Act, 21 U.S.C. section 360bbb-3(b)(1), unless the authorization is terminated or revoked.  Performed at [facility], [address], [place], [place] [address]   MRSA PCR Screening     Status: None   Collection Time: 10/21/20  6:26 AM   Specimen: Nasopharyngeal  Result Value Ref Range Status   MRSA by PCR NEGATIVE NEGATIVE Final    Comment:        The GeneXpert MRSA Assay (FDA approved for NASAL specimens only), is one component of a comprehensive MRSA colonization surveillance program. It is not intended to diagnose MRSA infection nor to guide or monitor treatment for MRSA infections. Performed at [facility], [address], [place], [place] [address]          Radiology Studies: CT Head Wo  Contrast  Result Date: 10/21/2020 CLINICAL DATA:  Trauma. Patient was found on the ground by bystander. Alcohol use. Small laceration to the right eyebrow. EXAM: CT HEAD WITHOUT CONTRAST CT CERVICAL SPINE WITHOUT CONTRAST TECHNIQUE: Multidetector CT imaging of the head and cervical spine was performed following the standard protocol without intravenous contrast. Multiplanar CT image reconstructions of the cervical spine were also generated. COMPARISON:  CT head 03/19/2019.  MRI brain 03/19/2019. FINDINGS: CT HEAD FINDINGS Brain: Mild diffuse cerebral atrophy. Mild ventricular dilatation consistent with central atrophy. Low-attenuation changes in the deep white matter consistent with small vessel ischemia. No mass-effect or midline shift. Asymmetric prominence of the CSF space anteriorly and to the left of the medulla. This may represent asymmetric CSF space or possibly ependymoma. No change since prior study. Otherwise, no abnormal extra-axial fluid collections. Gray-white matter junctions are distinct. Basal cisterns are not effaced. No acute intracranial hemorrhage. Vascular: No hyperdense vessel or unexpected calcification. Skull: Normal. Negative for fracture or focal lesion. Sinuses/Orbits: Mucosal thickening throughout the paranasal sinuses. Opacification of the left sphenoid sinus. Mastoid air cells are clear. Other: None. CT CERVICAL SPINE FINDINGS Alignment: Normal alignment of the cervical spine and facet joints. C1-2 articulation appears intact. Skull base and vertebrae: Skull base appears intact. No vertebral compression deformities. No focal bone lesion or bone destruction. Soft tissues and spinal canal: No prevertebral soft tissue swelling. No focal soft tissue mass or paraspinal infiltration. Visualized lymph nodes are not pathologically enlarged. Disc levels: Diffuse degenerative change throughout the cervical spine with narrowed interspaces and prominent osteophyte formation. Degenerative changes  in the facet joints. Uncovertebral and facet joint spurring causes bone encroachment upon multiple neural foramina bilaterally. Prominent degenerative changes also at C1-2 and at the tip of the clivus. Upper chest: Visualized lung apices are clear. Other: None. IMPRESSION: 1. No acute  intracranial abnormalities. Chronic atrophy and small vessel ischemic changes. Inflammatory changes in the paranasal sinuses. 2. Normal alignment of the cervical spine. Diffuse degenerative changes. No acute displaced fractures identified. Electronically Signed   By: Burman Nieves M.D.   On: 10/21/2020 00:54   CT Cervical Spine Wo Contrast  Result Date: 10/21/2020 CLINICAL DATA:  Trauma. Patient was found on the ground by bystander. Alcohol use. Small laceration to the right eyebrow. EXAM: CT HEAD WITHOUT CONTRAST CT CERVICAL SPINE WITHOUT CONTRAST TECHNIQUE: Multidetector CT imaging of the head and cervical spine was performed following the standard protocol without intravenous contrast. Multiplanar CT image reconstructions of the cervical spine were also generated. COMPARISON:  CT head 03/19/2019.  MRI brain 03/19/2019. FINDINGS: CT HEAD FINDINGS Brain: Mild diffuse cerebral atrophy. Mild ventricular dilatation consistent with central atrophy. Low-attenuation changes in the deep white matter consistent with small vessel ischemia. No mass-effect or midline shift. Asymmetric prominence of the CSF space anteriorly and to the left of the medulla. This may represent asymmetric CSF space or possibly ependymoma. No change since prior study. Otherwise, no abnormal extra-axial fluid collections. Gray-white matter junctions are distinct. Basal cisterns are not effaced. No acute intracranial hemorrhage. Vascular: No hyperdense vessel or unexpected calcification. Skull: Normal. Negative for fracture or focal lesion. Sinuses/Orbits: Mucosal thickening throughout the paranasal sinuses. Opacification of the left sphenoid sinus. Mastoid air  cells are clear. Other: None. CT CERVICAL SPINE FINDINGS Alignment: Normal alignment of the cervical spine and facet joints. C1-2 articulation appears intact. Skull base and vertebrae: Skull base appears intact. No vertebral compression deformities. No focal bone lesion or bone destruction. Soft tissues and spinal canal: No prevertebral soft tissue swelling. No focal soft tissue mass or paraspinal infiltration. Visualized lymph nodes are not pathologically enlarged. Disc levels: Diffuse degenerative change throughout the cervical spine with narrowed interspaces and prominent osteophyte formation. Degenerative changes in the facet joints. Uncovertebral and facet joint spurring causes bone encroachment upon multiple neural foramina bilaterally. Prominent degenerative changes also at C1-2 and at the tip of the clivus. Upper chest: Visualized lung apices are clear. Other: None. IMPRESSION: 1. No acute intracranial abnormalities. Chronic atrophy and small vessel ischemic changes. Inflammatory changes in the paranasal sinuses. 2. Normal alignment of the cervical spine. Diffuse degenerative changes. No acute displaced fractures identified. Electronically Signed   By: Burman Nieves M.D.   On: 10/21/2020 00:54   CT ABDOMEN PELVIS W CONTRAST  Result Date: 10/21/2020 CLINICAL DATA:  64 year old with anemia and unknown source of blood loss. Abdominal distension. Hypotension. Fall. Initial encounter. EXAM: CT ABDOMEN AND PELVIS WITH CONTRAST TECHNIQUE: Multidetector CT imaging of the abdomen and pelvis was performed using the standard protocol following bolus administration of intravenous contrast. CONTRAST:  OMNIPAQUE IOHEXOL 300 MG/ML IV. COMPARISON:  None. FINDINGS: Lower chest: Dependent atelectasis posteriorly in the lower lobes. Visualized lung bases otherwise clear. Heart mildly enlarged. Extensive bilateral gynecomastia. Hepatobiliary: Numerous subcentimeter low-attenuation lesions throughout the liver, the  largest measuring approximately 6 mm at the dome; this largest lesion is a cyst, making it likely that the smaller lesions are also cysts, given their conspicuity for their small size. Small gallstones in the dependent portion of the gallbladder. No pericholecystic edema. No biliary ductal dilation. Pancreas: Approximate 1.4 x 0.8 x 1.1 cm cystic lesion in the uncinate portion of the pancreas (2/25 and 5/39). Pancreas otherwise normal in appearance. Spleen: Normal in size and appearance. Adrenals/Urinary Tract: Normal appearing adrenal glands. Kidneys normal in size and appearance without focal parenchymal  abnormality. No hydronephrosis. No evidence of urinary tract calculi. Urinary bladder mildly distended. Stomach/Bowel: Stomach normal in appearance for the degree of distention. Normal-appearing small bowel. Large colonic stool burden. Scattered descending and sigmoid colon diverticula without evidence of acute diverticulitis. Normal appendix in the RIGHT mid pelvis. Vascular/Lymphatic: Moderate aortoiliofemoral atherosclerosis with predominantly noncalcified plaque. No evidence of aneurysm. Normal-appearing portal venous and systemic venous systems. No pathologic lymphadenopathy. Reproductive: Prostate gland and seminal vesicles normal in size and appearance for age. Other: Large RIGHT inguinal hernia containing multiple loops of small bowel. No evidence of strangulation or incarceration. Musculoskeletal: DISH involving the lower thoracic and upper lumbar spine. Osseous demineralization. Diffuse degenerative disc disease and spondylosis throughout the lumbar spine and diffuse facet degenerative changes resulting in severe multilevel multifactorial spinal stenosis. IMPRESSION: 1. No evidence of hemorrhage or hematoma in the abdomen or pelvis to explain the patient's anemia. 2. Large RIGHT inguinal hernia containing multiple loops of small bowel. No evidence of strangulation or incarceration. 3. Scattered  descending and sigmoid colon diverticula without evidence of acute diverticulitis. 4. Cholelithiasis without evidence of acute cholecystitis. 5. Approximate 1.4 cm cystic lesion in the uncinate portion of the pancreas. Recommend follow up pre and post contrast MRI/MRCP or pancreatic protocol CT in 2 years. This recommendation follows ACR consensus guidelines: Management of Incidental Pancreatic Cysts: A White Paper of the ACR Incidental Findings Committee. J Am Coll Radiol 2017;14:911-923. 6. Extensive bilateral gynecomastia. 7. Severe multilevel multifactorial spinal stenosis. Aortic Atherosclerosis (ICD10-I70.0). Electronically Signed   By: Hulan Saas M.D.   On: 10/21/2020 16:22   DG Chest Portable 1 View  Result Date: 10/21/2020 CLINICAL DATA:  Altered mental status. Found outside with low body temperature. EXAM: PORTABLE CHEST 1 VIEW COMPARISON:  03/18/2019 FINDINGS: Patient rotation limits examination. Heart size and pulmonary vascularity appear normal. Lungs are clear. No pleural effusions. No pneumothorax. Old left clavicular fracture. IMPRESSION: No active disease. Electronically Signed   By: Burman Nieves M.D.   On: 10/21/2020 00:23        Scheduled Meds: . Chlorhexidine Gluconate Cloth  6 each Topical Daily  . folic acid  1 mg Oral Daily  . levETIRAcetam  1,000 mg Oral BID  . mouth rinse  15 mL Mouth Rinse BID  . multivitamin with minerals  1 tablet Oral Daily  . potassium chloride  40 mEq Oral BID  . sodium chloride flush  3 mL Intravenous Q12H   Continuous Infusions: . ferumoxytol    . magnesium sulfate bolus IVPB    . thiamine injection       LOS: 1 day    Time spent: 45 min    Dorcas Carrow, MD [facility] Pager [phone]

## 2020-10-22 NOTE — Plan of Care (Signed)
Discussed with patient plan of care for the evening, pain management and importance of taking seizure medications with some teach back displayed at this time.  Problem: Education: Goal: Ability to manage disease process will improve Outcome: Progressing   Problem: Neurologic: Goal: Promote progressive neurologic recovery Outcome: Progressing

## 2020-10-23 DIAGNOSIS — T68XXXA Hypothermia, initial encounter: Secondary | ICD-10-CM | POA: Diagnosis not present

## 2020-10-23 LAB — COMPREHENSIVE METABOLIC PANEL
ALT: 24 U/L (ref 0–44)
AST: 43 U/L — ABNORMAL HIGH (ref 15–41)
Albumin: 3 g/dL — ABNORMAL LOW (ref 3.5–5.0)
Alkaline Phosphatase: 61 U/L (ref 38–126)
Anion gap: 9 (ref 5–15)
BUN: 12 mg/dL (ref 8–23)
CO2: 22 mmol/L (ref 22–32)
Calcium: 8.5 mg/dL — ABNORMAL LOW (ref 8.9–10.3)
Chloride: 104 mmol/L (ref 98–111)
Creatinine, Ser: 0.88 mg/dL (ref 0.61–1.24)
GFR, Estimated: >60 mL/min (ref >60)
Glucose, Bld: 76 mg/dL (ref 70–99)
Potassium: 5.3 mmol/L — ABNORMAL HIGH (ref 3.5–5.1)
Sodium: 135 mmol/L (ref 135–145)
Total Bilirubin: 1 mg/dL (ref 0.3–1.2)
Total Protein: 6.6 g/dL (ref 6.5–8.1)

## 2020-10-23 LAB — CBC WITH DIFFERENTIAL/PLATELET
Abs Immature Granulocytes: 0.02 10*3/uL (ref 0.00–0.07)
Abs Immature Granulocytes: 0.03 10*3/uL (ref 0.00–0.07)
Basophils Absolute: 0 10*3/uL (ref 0.0–0.1)
Basophils Absolute: 0.1 10*3/uL (ref 0.0–0.1)
Basophils Relative: 1 %
Basophils Relative: 1 %
Eosinophils Absolute: 0.3 10*3/uL (ref 0.0–0.5)
Eosinophils Absolute: 0.4 10*3/uL (ref 0.0–0.5)
Eosinophils Relative: 6 %
Eosinophils Relative: 4 %
HCT: 23.6 % — ABNORMAL LOW (ref 39.0–52.0)
HCT: 26.6 % — ABNORMAL LOW (ref 39.0–52.0)
Hemoglobin: 7 g/dL — ABNORMAL LOW (ref 13.0–17.0)
Hemoglobin: 7.7 g/dL — ABNORMAL LOW (ref 13.0–17.0)
Immature Granulocytes: 0 %
Immature Granulocytes: 0 %
Lymphocytes Relative: 34 %
Lymphocytes Relative: 21 %
Lymphs Abs: 1.5 10*3/uL (ref 0.7–4.0)
Lymphs Abs: 1.8 10*3/uL (ref 0.7–4.0)
MCH: 22.4 pg — ABNORMAL LOW (ref 26.0–34.0)
MCH: 22.1 pg — ABNORMAL LOW (ref 26.0–34.0)
MCHC: 29.7 g/dL — ABNORMAL LOW (ref 30.0–36.0)
MCHC: 28.9 g/dL — ABNORMAL LOW (ref 30.0–36.0)
MCV: 75.4 fL — ABNORMAL LOW (ref 80.0–100.0)
MCV: 76.2 fL — ABNORMAL LOW (ref 80.0–100.0)
Monocytes Absolute: 0.6 10*3/uL (ref 0.1–1.0)
Monocytes Absolute: 1.1 10*3/uL — ABNORMAL HIGH (ref 0.1–1.0)
Monocytes Relative: 13 %
Monocytes Relative: 13 %
Neutro Abs: 2.1 10*3/uL (ref 1.7–7.7)
Neutro Abs: 5.2 10*3/uL (ref 1.7–7.7)
Neutrophils Relative %: 46 %
Neutrophils Relative %: 61 %
Platelets: 104 10*3/uL — ABNORMAL LOW (ref 150–400)
Platelets: 119 10*3/uL — ABNORMAL LOW (ref 150–400)
RBC: 3.13 MIL/uL — ABNORMAL LOW (ref 4.22–5.81)
RBC: 3.49 MIL/uL — ABNORMAL LOW (ref 4.22–5.81)
RDW: 21.2 % — ABNORMAL HIGH (ref 11.5–15.5)
RDW: 22 % — ABNORMAL HIGH (ref 11.5–15.5)
WBC: 4.6 10*3/uL (ref 4.0–10.5)
WBC: 8.5 10*3/uL (ref 4.0–10.5)
nRBC: 0 % (ref 0.0–0.2)
nRBC: 0 % (ref 0.0–0.2)

## 2020-10-23 LAB — URINALYSIS, ROUTINE W REFLEX MICROSCOPIC
Bilirubin Urine: NEGATIVE
Glucose, UA: NEGATIVE mg/dL
Hgb urine dipstick: NEGATIVE
Ketones, ur: NEGATIVE mg/dL
Leukocytes,Ua: NEGATIVE
Nitrite: NEGATIVE
Protein, ur: NEGATIVE mg/dL
Specific Gravity, Urine: 1.011 (ref 1.005–1.030)
pH: 5 (ref 5.0–8.0)

## 2020-10-23 LAB — PREPARE PLATELET PHERESIS
Unit division: 0
Unit division: 0

## 2020-10-23 LAB — GLUCOSE, CAPILLARY
Glucose-Capillary: 83 mg/dL (ref 70–99)
Glucose-Capillary: 90 mg/dL (ref 70–99)
Glucose-Capillary: 98 mg/dL (ref 70–99)
Glucose-Capillary: 149 mg/dL — ABNORMAL HIGH (ref 70–99)

## 2020-10-23 LAB — RAPID URINE DRUG SCREEN, HOSP PERFORMED
Amphetamines: NOT DETECTED
Barbiturates: NOT DETECTED
Benzodiazepines: NOT DETECTED
Cocaine: NOT DETECTED
Opiates: NOT DETECTED
Tetrahydrocannabinol: NOT DETECTED

## 2020-10-23 LAB — BPAM PLATELET PHERESIS
Blood Product Expiration Date: 202111272359
Blood Product Expiration Date: 202111282359
ISSUE DATE / TIME: 202111260542
ISSUE DATE / TIME: 202111261502
Unit Type and Rh: 6200
Unit Type and Rh: 7300

## 2020-10-23 MED ORDER — NYSTATIN 100000 UNIT/GM EX CREA
TOPICAL_CREAM | Freq: Two times a day (BID) | CUTANEOUS | Status: DC
  Administered 2020-10-23: 1 via TOPICAL
  Filled 2020-10-23: qty 15

## 2020-10-23 NOTE — Evaluation (Signed)
Occupational Therapy Evaluation Patient Details Name: Mason Hart MRN: [id number] DOB: Dec 15, 1955 Today's Date: 10/23/2020    History of Present Illness Patient is a 64 year old male found down outside 11/24 by bystander, admitted with hypothermia, hypoglycemia. Hx ETOH abuse, hypertension, hypothyroidism, recurrent seizures, noncompliant with medications   Clinical Impression   Patient reports lives alone in single level house and is independent at baseline. Patient disoriented to year, following 1 step directions appropriately. Currently patient with decreased activity tolerance, balance and safety requiring min A for functional transfers and ambulation with rolling walker. Do not anticipate further OT needs at discharge, will continue to follow acutely to maximize patient safety and independence with self care.    Follow Up Recommendations  No OT follow up    Equipment Recommendations  Tub/shower seat       Precautions / Restrictions Precautions Precautions: Fall Restrictions Weight Bearing Restrictions: No      Mobility Bed Mobility Overal bed mobility: Needs Assistance Bed Mobility: Supine to Sit     Supine to sit: Supervision;HOB elevated     General bed mobility comments: patient able to mobilize to EOB, use of bed rail, supervision for safety    Transfers Overall transfer level: Needs assistance Equipment used: Rolling walker (2 wheeled) Transfers: Sit to/from Stand Sit to Stand: Min assist         General transfer comment: cues for hand placement, please see toilet transfer in ADL section    Balance Overall balance assessment: Needs assistance Sitting-balance support: Feet supported Sitting balance-Leahy Scale: Fair     Standing balance support: Bilateral upper extremity supported Standing balance-Leahy Scale: Poor Standing balance comment: reliant on external support                           ADL either performed or assessed with  clinical judgement   ADL Overall ADL's : Needs assistance/impaired     Grooming: Set up;Sitting   Upper Body Bathing: Minimal assistance;Sitting   Lower Body Bathing: Moderate assistance;Sitting/lateral leans;Sit to/from stand   Upper Body Dressing : Minimal assistance;Sitting   Lower Body Dressing: Moderate assistance;Sitting/lateral leans;Sit to/from stand   Toilet Transfer: Minimal assistance;Cueing for safety;RW;Ambulation Toilet Transfer Details (indicate cue type and reason): simulated with functional mobility and ambulation in room, min A for stability as patient with narrow base of support  Toileting- Clothing Manipulation and Hygiene: Minimal assistance;Sit to/from stand;Sitting/lateral lean       Functional mobility during ADLs: Minimal assistance;Rolling walker;Cueing for safety General ADL Comments: patient requiring increased assistance for self care due to decreased activity tolerance, balance, safety awareness, strength                  Pertinent Vitals/Pain Pain Assessment: No/denies pain     Hand Dominance Right   Extremity/Trunk Assessment Upper Extremity Assessment Upper Extremity Assessment: RUE deficits/detail;LUE deficits/detail;Generalized weakness RUE Deficits / Details: shoulder flexion~90  LUE Deficits / Details: shoulder flexion ~75 degrees   Lower Extremity Assessment Lower Extremity Assessment: Defer to PT evaluation       Communication Communication Communication: No difficulties   Cognition Arousal/Alertness: Awake/alert Behavior During Therapy: WFL for tasks assessed/performed Overall Cognitive Status: No family/caregiver present to determine baseline cognitive functioning                                 General Comments: disoriented to year, following 1 step directions appropriately  Home Living Family/patient expects to be discharged to:: Private residence Living Arrangements: Alone Available  Help at Discharge: Family;Available PRN/intermittently Type of Home: House Home Access: Level entry     Home Layout: One level     Bathroom Shower/Tub: Chief Strategy Officer: Standard     Home Equipment: Walker - 2 wheels          Prior Functioning/Environment Level of Independence: Independent                 OT Problem List: Decreased activity tolerance;Impaired balance (sitting and/or standing);Decreased safety awareness      OT Treatment/Interventions: Self-care/ADL training;DME and/or AE instruction;Therapeutic activities;Patient/family education;Balance training    OT Goals(Current goals can be found in the care plan section) Acute Rehab OT Goals Patient Stated Goal: agreeable to get to chair OT Goal Formulation: With patient Time For Goal Achievement: 11/06/20 Potential to Achieve Goals: Good  OT Frequency: Min 2X/week    AM-PAC OT "6 Clicks" Daily Activity     Outcome Measure Help from another person eating meals?: None Help from another person taking care of personal grooming?: A Little Help from another person toileting, which includes using toliet, bedpan, or urinal?: A Little Help from another person bathing (including washing, rinsing, drying)?: A Lot Help from another person to put on and taking off regular upper body clothing?: A Little Help from another person to put on and taking off regular lower body clothing?: A Lot 6 Click Score: 17   End of Session Equipment Utilized During Treatment: Rolling walker;Gait belt Nurse Communication: Mobility status  Activity Tolerance: Patient tolerated treatment well Patient left: in chair;with call bell/phone within reach;with chair alarm set  OT Visit Diagnosis: Other abnormalities of gait and mobility (R26.89)                Time: 9563-8756 OT Time Calculation (min): 28 min Charges:  OT General Charges $OT Visit: 1 Visit OT Evaluation $OT Eval Low Complexity: 1 Low  Marlyce Huge  OT OT pager: [phone]  Carmelia Roller 10/23/2020, 12:41 PM

## 2020-10-23 NOTE — Progress Notes (Signed)
PROGRESS NOTE    Stephan Nelis  LYY:[id number] DOB: 01-May-1956 DOA: 10/20/2020 PCP: [name], NP    Brief Narrative:  64 year old gentleman with alcoholism, hypertension, hypothyroidism, seizure disorder and noncompliance to medications found in his yard outside the house in chilling cold weather.  Brought by EMS to the ER.  In the emergency room he was found with temperature 89 degrees, bradycardic, potassium 2.6, lactic acid 9, blood sugars less than 40. Aggressively resuscitated, temperature controlled and admitted to stepdown unit. Lives with Lavada Mesi and she looks after medications.   Assessment & Plan:   Principal Problem:   Hypothermia Active Problems:   Hypoglycemia   Bradycardia   Lactic acidosis   Metabolic acidosis  Hypothermia/bradycardia/hypoglycemia/lactic acidosis with metabolic acidosis/hypotension Hypothermia probably related to environmental exposure.  Improved. Lactic acid normalized with fluid resuscitation, no evidence of localized infection. Blood sugars improved after eating regular diet, will continue to encourage high-calorie and high carbohydrate diet. Start mobilizing.  Work with PT OT today.  Seizure disorder: Suspect recurrent seizure as patient also admits to it.  Loaded with Keppra.  Currently remains on Keppra 1000 mg twice a day.  No more seizures in the hospital.  All-time seizure precautions.  Fall precautions.  Pancytopenia: Anemia, cause unknown.  Baseline hemoglobin on records are 10.  Has chronic iron deficiency anemia.   B12 folic acid normal.  No evidence of hemolysis.  LDH and reticulocyte's are normal.  Bilirubin is normal. Hemoglobin 5.4-2 units of PRBC transfusion-8-6.8-7.  Currently no evidence of bleeding.  Skeletal survey and CT scan of the abdomen pelvis with no evidence of hematoma.  We will continue to monitor and symptomatically treat. Transfuse PRBC only for hemodynamic instability and hemoglobin less then 6. IV  iron, 11/26  Thrombocytopenia: Probably transient more myelosuppression due to seizure.  Has chronic thrombocytopenia related to chronic liver disease. Platelets 113-5 overnight-1 unit of platelets given-77-100.  We will continue to monitor. Transfuse platelets only for hemodynamic instability, bleeding or platelets of less than 10.  Alcoholism: On CIWA scale.  No evidence of delirium tremens.   Treated with high-dose thiamine.  Benzodiazepine for alcohol withdrawal.    Hypomagnesemia: Aggressively replace electrolytes.  We will keep potassium more than 4 and magnesium more than 2.  Recheck levels tomorrow morning.   DVT prophylaxis: SCDs Start: 10/21/20 0430   Code Status: Full code Family Communication: friend Mardene Celeste on the phone.   Disposition Plan: Status is: Inpatient  Remains inpatient appropriate because:Inpatient level of care appropriate due to severity of illness   Dispo: The patient is from: Home              Anticipated d/c is to: Home              Anticipated d/c date is: tomorrow.               Patient currently is not medically stable to d/c.  Mobilize with PT OT.  If remains a stable, can transfer to MedSurg unit with possible discharge home tomorrow with patient's friend.   Consultants:   Hematology, curbside consultation regarding pancytopenia  Procedures:   None  Antimicrobials:   None   Subjective: Patient seen and examined.  No overnight events.  Patient himself denies any complaints.  Denies any nausea vomiting.  He is more alert and awake today.   Objective: Vitals:   10/23/20 0700 10/23/20 0800 10/23/20 0837 10/23/20 0900  BP: (!) 146/80 130/72  135/71  Pulse: 61 60  76  Resp: 13 (!) 9  17  Temp:   98 F (36.7 C)   TempSrc:   Oral   SpO2: 99% 95%  97%  Weight:      Height:        Intake/Output Summary (Last 24 hours) at 10/23/2020 1040 Last data filed at 10/23/2020 0900 Gross per 24 hour  Intake 1756.82 ml  Output 2100 ml  Net  -343.18 ml   Filed Weights   10/22/20 0900 10/23/20 0402  Weight: 84 kg 88.3 kg    Examination:  General exam: Appears calm and comfortable  Chronically sick looking.  Looks comfortable.  No tremors.  No evidence of withdrawals. Respiratory system: Clear to auscultation. Respiratory effort normal.  No added sounds. Cardiovascular system: S1 & S2 heard, RRR. No JVD, murmurs, rubs, gallops or clicks. No pedal edema. Gastrointestinal system: Abdomen is nondistended, soft and nontender. No organomegaly or masses felt. Normal bowel sounds heard. Central nervous system: Alert and oriented x 3.  Moves all extremities. Extremities: Symmetric 5 x 5 power. Skin: No rashes, lesions or ulcers Psychiatry: Judgement and insight appear compromised.  Mood & affect flat.    Data Reviewed: I have personally reviewed following labs and imaging studies  CBC: Recent Labs  Lab 10/20/20 2306 10/20/20 2306 10/21/20 0722 10/21/20 0722 10/21/20 2118 10/22/20 0314 10/22/20 1013 10/22/20 1634 10/23/20 0847  WBC 7.8   < > 5.4  --   --  2.0* 7.4 6.2 4.6  NEUTROABS 6.7  --   --   --   --  0.9* 5.0 3.4 2.1  HGB 5.8*   < > 5.4*   < > 8.3* 7.2* 6.6* 7.0* 7.0*  HCT 21.1*   < > 19.1*   < > 26.9* 23.5* 22.2* 23.5* 23.6*  MCV 75.4*   < > 72.6*  --   --  72.5* 75.3* 74.6* 75.4*  PLT 105*   < > 103*  --   --  <5* 77* 114* 104*   < > = values in this interval not displayed.   Basic Metabolic Panel: Recent Labs  Lab 10/20/20 2002 10/20/20 2002 10/20/20 2125 10/20/20 2306 10/21/20 0722 10/22/20 0314 10/23/20 0523  NA 139   < > 143 139 137 137 135  K 2.6*   < > 2.8* 3.0* 3.3* 4.5 5.3*  CL 110   < > 108 107 108 108 104  CO2 10*  --   --  16* 23 22 22   GLUCOSE 55*   < > 47* 142* 101* 80 76  BUN 18   < > 18 20 15 15 12   CREATININE 1.31*   < > 1.20 1.17 1.01 1.05 0.88  CALCIUM 9.2  --   --  9.5 8.9 9.0 8.5*  MG  --   --   --  2.1  --  1.8  --   PHOS  --   --   --   --  3.1 3.1  --    < > = values in  this interval not displayed.   GFR: Estimated Creatinine Clearance: 93.3 mL/min (by C-G formula based on SCr of 0.88 mg/dL). Liver Function Tests: Recent Labs  Lab 10/20/20 2002 10/20/20 2306 10/22/20 0314 10/23/20 0523  AST 28 34 37 43*  ALT 25 28 31 24   ALKPHOS 61 63 74 61  BILITOT 0.6 0.3 0.6 1.0  PROT 7.0 7.1 7.2 6.6  ALBUMIN 3.3* 3.4* 3.3* 3.0*   No results for input(s): LIPASE,  AMYLASE in the last 168 hours. Recent Labs  Lab 10/20/20 2306  AMMONIA 30   Coagulation Profile: No results for input(s): INR, PROTIME in the last 168 hours. Cardiac Enzymes: Recent Labs  Lab 10/20/20 2016 10/22/20 0314  CKTOTAL 134 193   BNP (last 3 results) No results for input(s): PROBNP in the last 8760 hours. HbA1C: No results for input(s): HGBA1C in the last 72 hours. CBG: Recent Labs  Lab 10/22/20 1149 10/22/20 1400 10/22/20 1458 10/22/20 2110 10/23/20 0755  GLUCAP 85 87 95 96 83   Lipid Profile: No results for input(s): CHOL, HDL, LDLCALC, TRIG, CHOLHDL, LDLDIRECT in the last 72 hours. Thyroid Function Tests: Recent Labs    10/21/20 0722  TSH 2.302   Anemia Panel: Recent Labs    10/21/20 0722 10/22/20 1013  VITAMINB12 637  --   FOLATE 7.6  --   FERRITIN 5*  --   TIBC 376  --   IRON 19*  --   RETICCTPCT 2.1 1.7   Sepsis Labs: Recent Labs  Lab 10/20/20 2306 10/21/20 0205 10/21/20 0722  PROCALCITON  --   --  <0.10  LATICACIDVEN 9.6* 5.9* 1.9    Recent Results (from the past 240 hour(s))  Culture, blood (routine x 2)     Status: None (Preliminary result)   Collection Time: 10/20/20 11:07 PM   Specimen: BLOOD  Result Value Ref Range Status   Specimen Description   Final    BLOOD SITE NOT SPECIFIED Performed at [facility], [address] [place], [place] [address]    Special Requests   Final    BOTTLES DRAWN AEROBIC AND ANAEROBIC Blood Culture adequate volume Performed at [facility], [address] [place],  [place] [address]    Culture   Final    NO GROWTH 2 DAYS Performed at [facility], [address] [place], [place] [address]    Report Status PENDING  Incomplete  Culture, blood (routine x 2)     Status: None (Preliminary result)   Collection Time: 10/20/20 11:07 PM   Specimen: BLOOD  Result Value Ref Range Status   Specimen Description   Final    BLOOD SITE NOT SPECIFIED Performed at [facility], [address] [place], [place] [address]    Special Requests   Final    BOTTLES DRAWN AEROBIC AND ANAEROBIC Blood Culture adequate volume Performed at [facility], [address] [place], [place] [address]    Culture   Final    NO GROWTH 2 DAYS Performed at [facility], [address] [place], [place] [address]    Report Status PENDING  Incomplete  Resp Panel by RT-PCR (Flu A&B, Covid) Nasopharyngeal Swab     Status: None   Collection Time: 10/20/20 11:08 PM   Specimen: Nasopharyngeal Swab; Nasopharyngeal(NP) swabs in vial transport medium  Result Value Ref Range Status   SARS Coronavirus 2 by RT PCR NEGATIVE NEGATIVE Final    Comment: (NOTE) SARS-CoV-2 target nucleic acids are NOT DETECTED.  The SARS-CoV-2 RNA is generally detectable in upper respiratory specimens during the acute phase of infection. The lowest concentration of SARS-CoV-2 viral copies this assay can detect is 138 copies/mL. A negative result does not preclude SARS-Cov-2 infection and should not be used as the sole basis for treatment or other patient management decisions. A negative result may occur with  improper specimen collection/handling, submission of specimen other than nasopharyngeal swab, presence of  viral mutation(s) within the areas targeted by this assay, and inadequate number of viral copies(<138 copies/mL). A negative result must be combined with clinical observations, patient history, and epidemiological information. The expected result is Negative.  Fact  Sheet for Patients:  BloggerCourse.com  Fact Sheet for Healthcare Providers:  SeriousBroker.it  This test is no t yet approved or cleared by the [place] FDA and  has been authorized for detection and/or diagnosis of SARS-CoV-2 by FDA under an Emergency Use Authorization (EUA). This EUA will remain  in effect (meaning this test can be used) for the duration of the COVID-19 declaration under Section 564(b)(1) of the Act, 21 U.S.C.section 360bbb-3(b)(1), unless the authorization is terminated  or revoked sooner.       Influenza A by PCR NEGATIVE NEGATIVE Final   Influenza B by PCR NEGATIVE NEGATIVE Final    Comment: (NOTE) The Xpert Xpress SARS-CoV-2/FLU/RSV plus assay is intended as an aid in the diagnosis of influenza from Nasopharyngeal swab specimens and should not be used as a sole basis for treatment. Nasal washings and aspirates are unacceptable for Xpert Xpress SARS-CoV-2/FLU/RSV testing.  Fact Sheet for Patients: BloggerCourse.com  Fact Sheet for Healthcare Providers: SeriousBroker.it  This test is not yet approved or cleared by the [place] FDA and has been authorized for detection and/or diagnosis of SARS-CoV-2 by FDA under an Emergency Use Authorization (EUA). This EUA will remain in effect (meaning this test can be used) for the duration of the COVID-19 declaration under Section 564(b)(1) of the Act, 21 U.S.C. section 360bbb-3(b)(1), unless the authorization is terminated or revoked.  Performed at [facility], [address] [place], [place] [address]   MRSA PCR Screening     Status: None   Collection Time: 10/21/20  6:26 AM   Specimen: Nasopharyngeal  Result Value Ref Range Status   MRSA by PCR NEGATIVE NEGATIVE Final    Comment:        The GeneXpert MRSA Assay (FDA approved for NASAL specimens only), is one component of  a comprehensive MRSA colonization surveillance program. It is not intended to diagnose MRSA infection nor to guide or monitor treatment for MRSA infections. Performed at [facility], [address], [place], [place] [address]          Radiology Studies: CT ABDOMEN PELVIS W CONTRAST  Result Date: 10/21/2020 CLINICAL DATA:  64 year old with anemia and unknown source of blood loss. Abdominal distension. Hypotension. Fall. Initial encounter. EXAM: CT ABDOMEN AND PELVIS WITH CONTRAST TECHNIQUE: Multidetector CT imaging of the abdomen and pelvis was performed using the standard protocol following bolus administration of intravenous contrast. CONTRAST:  OMNIPAQUE IOHEXOL 300 MG/ML IV. COMPARISON:  None. FINDINGS: Lower chest: Dependent atelectasis posteriorly in the lower lobes. Visualized lung bases otherwise clear. Heart mildly enlarged. Extensive bilateral gynecomastia. Hepatobiliary: Numerous subcentimeter low-attenuation lesions throughout the liver, the largest measuring approximately 6 mm at the dome; this largest lesion is a cyst, making it likely that the smaller lesions are also cysts, given their conspicuity for their small size. Small gallstones in the dependent portion of the gallbladder. No pericholecystic edema. No biliary ductal dilation. Pancreas: Approximate 1.4 x 0.8 x 1.1 cm cystic lesion in the uncinate portion of the pancreas (2/25 and 5/39). Pancreas otherwise normal in appearance. Spleen: Normal in size and appearance. Adrenals/Urinary Tract: Normal appearing adrenal glands. Kidneys normal in size and appearance without focal parenchymal abnormality. No hydronephrosis. No evidence of urinary tract calculi. Urinary bladder mildly distended. Stomach/Bowel:  Stomach normal in appearance for the degree of distention. Normal-appearing small bowel. Large colonic stool burden. Scattered descending and sigmoid colon diverticula without evidence of acute  diverticulitis. Normal appendix in the RIGHT mid pelvis. Vascular/Lymphatic: Moderate aortoiliofemoral atherosclerosis with predominantly noncalcified plaque. No evidence of aneurysm. Normal-appearing portal venous and systemic venous systems. No pathologic lymphadenopathy. Reproductive: Prostate gland and seminal vesicles normal in size and appearance for age. Other: Large RIGHT inguinal hernia containing multiple loops of small bowel. No evidence of strangulation or incarceration. Musculoskeletal: DISH involving the lower thoracic and upper lumbar spine. Osseous demineralization. Diffuse degenerative disc disease and spondylosis throughout the lumbar spine and diffuse facet degenerative changes resulting in severe multilevel multifactorial spinal stenosis. IMPRESSION: 1. No evidence of hemorrhage or hematoma in the abdomen or pelvis to explain the patient's anemia. 2. Large RIGHT inguinal hernia containing multiple loops of small bowel. No evidence of strangulation or incarceration. 3. Scattered descending and sigmoid colon diverticula without evidence of acute diverticulitis. 4. Cholelithiasis without evidence of acute cholecystitis. 5. Approximate 1.4 cm cystic lesion in the uncinate portion of the pancreas. Recommend follow up pre and post contrast MRI/MRCP or pancreatic protocol CT in 2 years. This recommendation follows ACR consensus guidelines: Management of Incidental Pancreatic Cysts: A White Paper of the ACR Incidental Findings Committee. J Am Coll Radiol 2017;14:911-923. 6. Extensive bilateral gynecomastia. 7. Severe multilevel multifactorial spinal stenosis. Aortic Atherosclerosis (ICD10-I70.0). Electronically Signed   By: Hulan Saas M.D.   On: 10/21/2020 16:22        Scheduled Meds: . Chlorhexidine Gluconate Cloth  6 each Topical Daily  . folic acid  1 mg Oral Daily  . levETIRAcetam  1,000 mg Oral BID  . mouth rinse  15 mL Mouth Rinse BID  . multivitamin with minerals  1 tablet Oral  Daily  . nystatin cream   Topical BID  . sodium chloride flush  3 mL Intravenous Q12H   Continuous Infusions: . thiamine injection Stopped (10/23/20 0857)     LOS: 2 days    Time spent: 30 min    Dorcas Carrow, MD [facility] Pager [phone]
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
## 2020-10-24 DIAGNOSIS — T68XXXA Hypothermia, initial encounter: Secondary | ICD-10-CM | POA: Diagnosis not present

## 2020-10-24 LAB — CBC WITH DIFFERENTIAL/PLATELET
Abs Immature Granulocytes: 0.02 10*3/uL (ref 0.00–0.07)
Basophils Absolute: 0.1 10*3/uL (ref 0.0–0.1)
Basophils Relative: 1 %
Eosinophils Absolute: 0.4 10*3/uL (ref 0.0–0.5)
Eosinophils Relative: 7 %
HCT: 25.1 % — ABNORMAL LOW (ref 39.0–52.0)
Hemoglobin: 7.4 g/dL — ABNORMAL LOW (ref 13.0–17.0)
Immature Granulocytes: 0 %
Lymphocytes Relative: 31 %
Lymphs Abs: 2 10*3/uL (ref 0.7–4.0)
MCH: 21.8 pg — ABNORMAL LOW (ref 26.0–34.0)
MCHC: 29.5 g/dL — ABNORMAL LOW (ref 30.0–36.0)
MCV: 74 fL — ABNORMAL LOW (ref 80.0–100.0)
Monocytes Absolute: 0.7 10*3/uL (ref 0.1–1.0)
Monocytes Relative: 11 %
Neutro Abs: 3.3 10*3/uL (ref 1.7–7.7)
Neutrophils Relative %: 50 %
Platelets: 120 10*3/uL — ABNORMAL LOW (ref 150–400)
RBC: 3.39 MIL/uL — ABNORMAL LOW (ref 4.22–5.81)
RDW: 21.6 % — ABNORMAL HIGH (ref 11.5–15.5)
WBC: 6.4 10*3/uL (ref 4.0–10.5)
nRBC: 0 % (ref 0.0–0.2)

## 2020-10-24 LAB — COMPREHENSIVE METABOLIC PANEL
ALT: 21 U/L (ref 0–44)
AST: 20 U/L (ref 15–41)
Albumin: 3.1 g/dL — ABNORMAL LOW (ref 3.5–5.0)
Alkaline Phosphatase: 64 U/L (ref 38–126)
Anion gap: 9 (ref 5–15)
BUN: 11 mg/dL (ref 8–23)
CO2: 26 mmol/L (ref 22–32)
Calcium: 8.9 mg/dL (ref 8.9–10.3)
Chloride: 103 mmol/L (ref 98–111)
Creatinine, Ser: 0.87 mg/dL (ref 0.61–1.24)
GFR, Estimated: >60 mL/min (ref >60)
Glucose, Bld: 80 mg/dL (ref 70–99)
Potassium: 4.3 mmol/L (ref 3.5–5.1)
Sodium: 138 mmol/L (ref 135–145)
Total Bilirubin: 0.7 mg/dL (ref 0.3–1.2)
Total Protein: 7 g/dL (ref 6.5–8.1)

## 2020-10-24 LAB — PHOSPHORUS: Phosphorus: 3.8 mg/dL (ref 2.5–4.6)

## 2020-10-24 LAB — GLUCOSE, CAPILLARY
Glucose-Capillary: 74 mg/dL (ref 70–99)
Glucose-Capillary: 107 mg/dL — ABNORMAL HIGH (ref 70–99)

## 2020-10-24 LAB — MAGNESIUM: Magnesium: 2 mg/dL (ref 1.7–2.4)

## 2020-10-24 MED ORDER — FERROUS SULFATE 325 (65 FE) MG PO TABS: 325.0000 mg | ORAL_TABLET | Freq: Every day | ORAL | 3 refills | Status: DC

## 2020-10-24 MED ORDER — ADULT MULTIVITAMIN W/MINERALS CH: 1.0000 | ORAL_TABLET | Freq: Every day | ORAL | 0 refills | Status: AC

## 2020-10-24 NOTE — TOC Progression Note (Signed)
Transition of Care [facility]) - Progression Note    Patient Details  Name: Mason Hart MRN: [id number] Date of Birth: 03-Sep-1956  Transition of [facility]) CM/SW Contact  Armanda Heritage, RN Phone Number: 10/24/2020, 12:24 PM  Clinical Narrative:    [facility] to provide HHPT and Social Work services.  Expected Discharge Plan: Home w Home Health Services Barriers to Discharge: No Barriers Identified  Expected Discharge Plan and Services Expected Discharge Plan: Home w Home Health Services   Discharge Planning Services: CM Consult   Living arrangements for the past 2 months: Single Family Home Expected Discharge Date: 10/24/20                         HH Arranged: PT, Social Work HH Agency: [facility] ([facility]) Date HH Agency Contacted: 10/24/20 Time HH Agency Contacted: 1221 Representative spoke with at [facility] Agency: Barbara Cower   Social Determinants of Health (SDOH) Interventions    Readmission Risk Interventions Readmission Risk Prevention Plan 03/23/2019  Transportation Screening Complete  PCP or Specialist Appt within 5-7 Days Not Complete  Home Care Screening Complete  Medication Review (RN CM) Not Complete  Some recent data might be hidden

## 2020-10-24 NOTE — Discharge Summary (Signed)
Physician Discharge Summary  Darnelle Corp ZOX:[id number] DOB: 08-03-1956 DOA: 10/20/2020  PCP: [name], NP  Admit date: 10/20/2020 Discharge date: 10/24/2020  Admitted From: Home Disposition: Home with home health  Recommendations for Outpatient Follow-up:  1. Follow up with PCP in 1-2 weeks 2. Please obtain BMP/CBC in one week 3. Call and schedule follow-up with your neurologist.  Home Health: Physical therapy, social worker. Equipment/Devices: Patient has rolling walker at home  Discharge Condition: Stable CODE STATUS: Full code Diet recommendation: Regular diet  Discharge summary: 64 year old gentleman with alcoholism, hypertension, hypothyroidism, seizure disorder who was found in his yard and chilling cold weather.  Patient apparently was trying to walk to a nearby store to get some alcohol and might have suffered seizure.  He is poor historian.  He lives at home with a roommate who helps supervise him.  He walks with a walker at baseline.  He did not have his walker when he was found outside the house.  EMS brought him to the ER, his temperature was 89, his potassium was 2.6, lactic acid was 9, blood sugar was 38.  Patient was aggressively resuscitated, warmed and admitted to hospital. Patient also noted to have cognitive decline for last many months.  # Hypothermia/bradycardia/hypoglycemia/lactic acidosis with metabolic acidosis/hypotension: Hypothermia related to environmental exposure.  Improved. Lactic acid normalized with fluid resuscitation, no evidence of localized infection. Blood sugars improved after eating regular diet.   # Seizure disorder: Suspect recurrent seizure as patient also admits to it.  Loaded with Keppra.  Currently remains on Keppra 1000 mg twice a day.  No more seizures in the hospital.  All-time seizure precautions.  Fall precautions. Patient will resume his Keppra at home.  They will schedule a follow-up with his neurologist after  discharge.  # Pancytopenia: Patient had acute pancytopenia.  Hemoglobin 5.4 -2 units of PRBC transfusion and hemoglobin is stable since then.  His ferritin was 5, was given 1 dose of IV iron.  Discharged on oral iron therapy.   Platelets dropped to 5 -1 unit platelets given and ultimately improved and normalized to his baseline of 120.   Stool Hemoccult was negative.  Skeletal survey and abdominal scans negative for any evidence of hematoma or bleeding. His pancytopenia was thought to be acute myelosuppression from hypothermia or seizure.  There was no evidence of thrombolysis or other abnormalities. Will need repeat check in about 1 week to ensure stabilization.  Electrolyte abnormalities were corrected before discharge. Patient mobilized with physical therapy.  He is at his baseline.  He needs to use walker all the time.  He needs supervised living that is arranged by family. Patient is going home today.  He will benefit with social work evaluation for his living condition.  He will also benefit with home health physical therapy to improve his mobility.  Patient will need complete abstinence from alcohol.  All-time seizure precautions.    Discharge Diagnoses:  Principal Problem:   Hypothermia Active Problems:   Hypoglycemia   Bradycardia   Lactic acidosis   Metabolic acidosis    Discharge Instructions  Discharge Instructions    Diet general   Complete by: As directed    Discharge instructions   Complete by: As directed    Seizure precautions all the time   Increase activity slowly   Complete by: As directed      Allergies as of 10/24/2020   No Known Allergies     Medication List    TAKE these medications  ferrous sulfate 325 (65 FE) MG tablet Take 1 tablet (325 mg total) by mouth daily.   levETIRAcetam 1000 MG tablet Commonly known as: KEPPRA Take 1 tablet (1,000 mg total) by mouth 2 (two) times daily. Last fill! Must keep appointment.   multivitamin with  minerals Tabs tablet Take 1 tablet by mouth daily. Start taking on: October 25, 2020       Follow-up Information    Claiborne RiggFleming, Zelda W, NP Follow up in 2 week(s).   Specialty: Nurse Practitioner Contact information: [address] [place] [address] [phone]        Levert FeinsteinYan, Yijun, MD. Call in 2 day(s).   Specialty: Neurology Contact information: [address] [place] [id number] [phone]        [facility]wood GamblerLlc, [facility] Follow up.   Why: agency will provide home health physical therapy and social work. Contact information: [address] [place] [id number] [phone]              No Known Allergies  Consultations:  None   Procedures/Studies: CT Head Wo Contrast  Result Date: 10/21/2020 CLINICAL DATA:  Trauma. Patient was found on the ground by bystander. Alcohol use. Small laceration to the right eyebrow. EXAM: CT HEAD WITHOUT CONTRAST CT CERVICAL SPINE WITHOUT CONTRAST TECHNIQUE: Multidetector CT imaging of the head and cervical spine was performed following the standard protocol without intravenous contrast. Multiplanar CT image reconstructions of the cervical spine were also generated. COMPARISON:  CT head 03/19/2019.  MRI brain 03/19/2019. FINDINGS: CT HEAD FINDINGS Brain: Mild diffuse cerebral atrophy. Mild ventricular dilatation consistent with central atrophy. Low-attenuation changes in the deep white matter consistent with small vessel ischemia. No mass-effect or midline shift. Asymmetric prominence of the CSF space anteriorly and to the left of the medulla. This may represent asymmetric CSF space or possibly ependymoma. No change since prior study. Otherwise, no abnormal extra-axial fluid collections. Gray-white matter junctions are distinct. Basal cisterns are not effaced. No acute intracranial hemorrhage. Vascular: No hyperdense vessel or unexpected calcification. Skull: Normal. Negative for fracture or focal  lesion. Sinuses/Orbits: Mucosal thickening throughout the paranasal sinuses. Opacification of the left sphenoid sinus. Mastoid air cells are clear. Other: None. CT CERVICAL SPINE FINDINGS Alignment: Normal alignment of the cervical spine and facet joints. C1-2 articulation appears intact. Skull base and vertebrae: Skull base appears intact. No vertebral compression deformities. No focal bone lesion or bone destruction. Soft tissues and spinal canal: No prevertebral soft tissue swelling. No focal soft tissue mass or paraspinal infiltration. Visualized lymph nodes are not pathologically enlarged. Disc levels: Diffuse degenerative change throughout the cervical spine with narrowed interspaces and prominent osteophyte formation. Degenerative changes in the facet joints. Uncovertebral and facet joint spurring causes bone encroachment upon multiple neural foramina bilaterally. Prominent degenerative changes also at C1-2 and at the tip of the clivus. Upper chest: Visualized lung apices are clear. Other: None. IMPRESSION: 1. No acute intracranial abnormalities. Chronic atrophy and small vessel ischemic changes. Inflammatory changes in the paranasal sinuses. 2. Normal alignment of the cervical spine. Diffuse degenerative changes. No acute displaced fractures identified. Electronically Signed   By: Burman NievesWilliam  Stevens M.D.   On: 10/21/2020 00:54   CT Cervical Spine Wo Contrast  Result Date: 10/21/2020 CLINICAL DATA:  Trauma. Patient was found on the ground by bystander. Alcohol use. Small laceration to the right eyebrow. EXAM: CT HEAD WITHOUT CONTRAST CT CERVICAL SPINE WITHOUT CONTRAST TECHNIQUE: Multidetector CT imaging of the head and cervical spine  was performed following the standard protocol without intravenous contrast. Multiplanar CT image reconstructions of the cervical spine were also generated. COMPARISON:  CT head 03/19/2019.  MRI brain 03/19/2019. FINDINGS: CT HEAD FINDINGS Brain: Mild diffuse cerebral atrophy.  Mild ventricular dilatation consistent with central atrophy. Low-attenuation changes in the deep white matter consistent with small vessel ischemia. No mass-effect or midline shift. Asymmetric prominence of the CSF space anteriorly and to the left of the medulla. This may represent asymmetric CSF space or possibly ependymoma. No change since prior study. Otherwise, no abnormal extra-axial fluid collections. Gray-white matter junctions are distinct. Basal cisterns are not effaced. No acute intracranial hemorrhage. Vascular: No hyperdense vessel or unexpected calcification. Skull: Normal. Negative for fracture or focal lesion. Sinuses/Orbits: Mucosal thickening throughout the paranasal sinuses. Opacification of the left sphenoid sinus. Mastoid air cells are clear. Other: None. CT CERVICAL SPINE FINDINGS Alignment: Normal alignment of the cervical spine and facet joints. C1-2 articulation appears intact. Skull base and vertebrae: Skull base appears intact. No vertebral compression deformities. No focal bone lesion or bone destruction. Soft tissues and spinal canal: No prevertebral soft tissue swelling. No focal soft tissue mass or paraspinal infiltration. Visualized lymph nodes are not pathologically enlarged. Disc levels: Diffuse degenerative change throughout the cervical spine with narrowed interspaces and prominent osteophyte formation. Degenerative changes in the facet joints. Uncovertebral and facet joint spurring causes bone encroachment upon multiple neural foramina bilaterally. Prominent degenerative changes also at C1-2 and at the tip of the clivus. Upper chest: Visualized lung apices are clear. Other: None. IMPRESSION: 1. No acute intracranial abnormalities. Chronic atrophy and small vessel ischemic changes. Inflammatory changes in the paranasal sinuses. 2. Normal alignment of the cervical spine. Diffuse degenerative changes. No acute displaced fractures identified. Electronically Signed   By: Burman Nieves M.D.   On: 10/21/2020 00:54   CT ABDOMEN PELVIS W CONTRAST  Result Date: 10/21/2020 CLINICAL DATA:  64 year old with anemia and unknown source of blood loss. Abdominal distension. Hypotension. Fall. Initial encounter. EXAM: CT ABDOMEN AND PELVIS WITH CONTRAST TECHNIQUE: Multidetector CT imaging of the abdomen and pelvis was performed using the standard protocol following bolus administration of intravenous contrast. CONTRAST:  OMNIPAQUE IOHEXOL 300 MG/ML IV. COMPARISON:  None. FINDINGS: Lower chest: Dependent atelectasis posteriorly in the lower lobes. Visualized lung bases otherwise clear. Heart mildly enlarged. Extensive bilateral gynecomastia. Hepatobiliary: Numerous subcentimeter low-attenuation lesions throughout the liver, the largest measuring approximately 6 mm at the dome; this largest lesion is a cyst, making it likely that the smaller lesions are also cysts, given their conspicuity for their small size. Small gallstones in the dependent portion of the gallbladder. No pericholecystic edema. No biliary ductal dilation. Pancreas: Approximate 1.4 x 0.8 x 1.1 cm cystic lesion in the uncinate portion of the pancreas (2/25 and 5/39). Pancreas otherwise normal in appearance. Spleen: Normal in size and appearance. Adrenals/Urinary Tract: Normal appearing adrenal glands. Kidneys normal in size and appearance without focal parenchymal abnormality. No hydronephrosis. No evidence of urinary tract calculi. Urinary bladder mildly distended. Stomach/Bowel: Stomach normal in appearance for the degree of distention. Normal-appearing small bowel. Large colonic stool burden. Scattered descending and sigmoid colon diverticula without evidence of acute diverticulitis. Normal appendix in the RIGHT mid pelvis. Vascular/Lymphatic: Moderate aortoiliofemoral atherosclerosis with predominantly noncalcified plaque. No evidence of aneurysm. Normal-appearing portal venous and systemic venous systems. No pathologic  lymphadenopathy. Reproductive: Prostate gland and seminal vesicles normal in size and appearance for age. Other: Large RIGHT inguinal hernia containing multiple loops of small bowel.  No evidence of strangulation or incarceration. Musculoskeletal: DISH involving the lower thoracic and upper lumbar spine. Osseous demineralization. Diffuse degenerative disc disease and spondylosis throughout the lumbar spine and diffuse facet degenerative changes resulting in severe multilevel multifactorial spinal stenosis. IMPRESSION: 1. No evidence of hemorrhage or hematoma in the abdomen or pelvis to explain the patient's anemia. 2. Large RIGHT inguinal hernia containing multiple loops of small bowel. No evidence of strangulation or incarceration. 3. Scattered descending and sigmoid colon diverticula without evidence of acute diverticulitis. 4. Cholelithiasis without evidence of acute cholecystitis. 5. Approximate 1.4 cm cystic lesion in the uncinate portion of the pancreas. Recommend follow up pre and post contrast MRI/MRCP or pancreatic protocol CT in 2 years. This recommendation follows ACR consensus guidelines: Management of Incidental Pancreatic Cysts: A White Paper of the ACR Incidental Findings Committee. J Am Coll Radiol 2017;14:911-923. 6. Extensive bilateral gynecomastia. 7. Severe multilevel multifactorial spinal stenosis. Aortic Atherosclerosis (ICD10-I70.0). Electronically Signed   By: Hulan Saas M.D.   On: 10/21/2020 16:22   DG Chest Portable 1 View  Result Date: 10/21/2020 CLINICAL DATA:  Altered mental status. Found outside with low body temperature. EXAM: PORTABLE CHEST 1 VIEW COMPARISON:  03/18/2019 FINDINGS: Patient rotation limits examination. Heart size and pulmonary vascularity appear normal. Lungs are clear. No pleural effusions. No pneumothorax. Old left clavicular fracture. IMPRESSION: No active disease. Electronically Signed   By: Burman Nieves M.D.   On: 10/21/2020 00:23   (Echo, Carotid,  EGD, Colonoscopy, ERCP)    Subjective: Patient seen and examined.  No overnight events.  He has some cognitive dysfunction.  Eager to go home.   Discharge Exam: Vitals:   10/24/20 0600 10/24/20 0838  BP: 123/60   Pulse: (!) 59   Resp: (!) 7   Temp:  98 F (36.7 C)  SpO2: 96%    Vitals:   10/24/20 0400 10/24/20 0500 10/24/20 0600 10/24/20 0838  BP: (!) 117/52 117/60 123/60   Pulse: 60 64 (!) 59   Resp: (!) 8 11 (!) 7   Temp: 97.6 F (36.4 C)   98 F (36.7 C)  TempSrc: Oral   Axillary  SpO2: 94% 93% 96%   Weight:      Height:        General: Pt is alert, awake, not in acute distress Patient walked himself with a walker with this provider. Was comfortable. Patient is alert and awake, oriented x2. Cardiovascular: RRR, S1/S2 +, no rubs, no gallops Respiratory: CTA bilaterally, no wheezing, no rhonchi Abdominal: Soft, NT, ND, bowel sounds + Extremities: no edema, no cyanosis    The results of significant diagnostics from this hospitalization (including imaging, microbiology, ancillary and laboratory) are listed below for reference.     Microbiology: Recent Results (from the past 240 hour(s))  Culture, blood (routine x 2)     Status: None (Preliminary result)   Collection Time: 10/20/20 11:07 PM   Specimen: BLOOD  Result Value Ref Range Status   Specimen Description   Final    BLOOD SITE NOT SPECIFIED Performed at [facility], [address], [place], [place] [address]    Special Requests   Final    BOTTLES DRAWN AEROBIC AND ANAEROBIC Blood Culture adequate volume Performed at [facility], [address], [place], [place] [address]    Culture   Final    NO GROWTH 3 DAYS Performed at [facility], [address] [place], [place] [address]    Report Status PENDING  Incomplete  Culture, blood (routine x 2)     Status: None (Preliminary result)   Collection Time: 10/20/20 11:07 PM   Specimen: BLOOD  Result Value Ref Range Status    Specimen Description   Final    BLOOD SITE NOT SPECIFIED Performed at [facility], [address], [place], [place] [address]    Special Requests   Final    BOTTLES DRAWN AEROBIC AND ANAEROBIC Blood Culture adequate volume Performed at [facility], [address], [place], [place] [address]    Culture   Final    NO GROWTH 3 DAYS Performed at [facility], [address], [place], [place] [address]    Report Status PENDING  Incomplete  Resp Panel by RT-PCR (Flu A&B, Covid) Nasopharyngeal Swab     Status: None   Collection Time: 10/20/20 11:08 PM   Specimen: Nasopharyngeal Swab; Nasopharyngeal(NP) swabs in vial transport medium  Result Value Ref Range Status   SARS Coronavirus 2 by RT PCR NEGATIVE NEGATIVE Final    Comment: (NOTE) SARS-CoV-2 target nucleic acids are NOT DETECTED.  The SARS-CoV-2 RNA is generally detectable in upper respiratory specimens during the acute phase of infection. The lowest concentration of SARS-CoV-2 viral copies this assay can detect is 138 copies/mL. A negative result does not preclude SARS-Cov-2 infection and should not be used as the sole basis for treatment or other patient management decisions. A negative result may occur with  improper specimen collection/handling, submission of specimen other than nasopharyngeal swab, presence of viral mutation(s) within the areas targeted by this assay, and inadequate number of viral copies(<138 copies/mL). A negative result must be combined with clinical observations, patient history, and epidemiological information. The expected result is Negative.  Fact Sheet for Patients:  BloggerCourse.com  Fact Sheet for Healthcare Providers:  SeriousBroker.it  This test is no t yet approved or cleared by the [place] FDA and  has been authorized for detection and/or diagnosis of SARS-CoV-2 by FDA under an Emergency Use  Authorization (EUA). This EUA will remain  in effect (meaning this test can be used) for the duration of the COVID-19 declaration under Section 564(b)(1) of the Act, 21 U.S.C.section 360bbb-3(b)(1), unless the authorization is terminated  or revoked sooner.       Influenza A by PCR NEGATIVE NEGATIVE Final   Influenza B by PCR NEGATIVE NEGATIVE Final    Comment: (NOTE) The Xpert Xpress SARS-CoV-2/FLU/RSV plus assay is intended as an aid in the diagnosis of influenza from Nasopharyngeal swab specimens and should not be used as a sole basis for treatment. Nasal washings and aspirates are unacceptable for Xpert Xpress SARS-CoV-2/FLU/RSV testing.  Fact Sheet for Patients: BloggerCourse.com  Fact Sheet for Healthcare Providers: SeriousBroker.it  This test is not yet approved or cleared by the [place] FDA and has been authorized for detection and/or diagnosis of SARS-CoV-2 by FDA under an Emergency Use Authorization (EUA). This EUA will remain in effect (meaning this test can be used) for the duration of the COVID-19 declaration under Section 564(b)(1) of the Act, 21 U.S.C. section 360bbb-3(b)(1), unless the authorization is terminated or revoked.  Performed at [facility], [address], [place], [place] [address]   MRSA PCR Screening     Status: None   Collection Time: 10/21/20  6:26 AM   Specimen: Nasal Mucosa; Nasopharyngeal  Result Value Ref Range Status   MRSA by PCR NEGATIVE NEGATIVE Final    Comment:  The GeneXpert MRSA Assay (FDA approved for NASAL specimens only), is one component of a comprehensive MRSA colonization surveillance program. It is not intended to diagnose MRSA infection nor to guide or monitor treatment for MRSA infections. Performed at [facility], [address] [place], [place] [address]      Labs: BNP (last 3 results) No results for  input(s): BNP in the last 8760 hours. Basic Metabolic Panel: Recent Labs  Lab 10/20/20 2306 10/21/20 0722 10/22/20 0314 10/23/20 0523 10/24/20 0330  NA 139 137 137 135 138  K 3.0* 3.3* 4.5 5.3* 4.3  CL 107 108 108 104 103  CO2 16* GLUCOSE 142* 101* 80 76 80  BUN CREATININE 1.17 1.01 1.05 0.88 0.87  CALCIUM 9.5 8.9 9.0 8.5* 8.9  MG 2.1  --  1.8  --  2.0  PHOS  --  3.1 3.1  --  3.8   Liver Function Tests: Recent Labs  Lab 10/20/20 2002 10/20/20 2306 10/22/20 0314 10/23/20 0523 10/24/20 0330  AST 28 34 37 43* 20  ALT ALKPHOS 61 63 74 61 64  BILITOT 0.6 0.3 0.6 1.0 0.7  PROT 7.0 7.1 7.2 6.6 7.0  ALBUMIN 3.3* 3.4* 3.3* 3.0* 3.1*   No results for input(s): LIPASE, AMYLASE in the last 168 hours. Recent Labs  Lab 10/20/20 2306  AMMONIA 30   CBC: Recent Labs  Lab 10/22/20 1013 10/22/20 1634 10/23/20 0847 10/23/20 1716 10/24/20 0330  WBC 7.4 6.2 4.6 8.5 6.4  NEUTROABS 5.0 3.4 2.1 5.2 3.3  HGB 6.6* 7.0* 7.0* 7.7* 7.4*  HCT 22.2* 23.5* 23.6* 26.6* 25.1*  MCV 75.3* 74.6* 75.4* 76.2* 74.0*  PLT 77* 114* 104* 119* 120*   Cardiac Enzymes: Recent Labs  Lab 10/20/20 2016 10/22/20 0314  CKTOTAL 134 193   BNP: Invalid input(s): POCBNP CBG: Recent Labs  Lab 10/23/20 1214 10/23/20 1610 10/23/20 2143 10/24/20 0731 10/24/20 1155  GLUCAP 90 98 149* 74 107*   D-Dimer No results for input(s): DDIMER in the last 72 hours. Hgb A1c No results for input(s): HGBA1C in the last 72 hours. Lipid Profile No results for input(s): CHOL, HDL, LDLCALC, TRIG, CHOLHDL, LDLDIRECT in the last 72 hours. Thyroid function studies No results for input(s): TSH, T4TOTAL, T3FREE, THYROIDAB in the last 72 hours.  Invalid input(s): FREET3 Anemia work up Recent Labs    10/22/20 1013  RETICCTPCT 1.7   Urinalysis    Component Value Date/Time   COLORURINE YELLOW 10/23/2020 1205   APPEARANCEUR CLEAR 10/23/2020 1205   LABSPEC 1.011  10/23/2020 1205   PHURINE 5.0 10/23/2020 1205   GLUCOSEU NEGATIVE 10/23/2020 1205   HGBUR NEGATIVE 10/23/2020 1205   BILIRUBINUR NEGATIVE 10/23/2020 1205   KETONESUR NEGATIVE 10/23/2020 1205   PROTEINUR NEGATIVE 10/23/2020 1205   UROBILINOGEN 1.0 04/04/2012 1449   NITRITE NEGATIVE 10/23/2020 1205   LEUKOCYTESUR NEGATIVE 10/23/2020 1205   Sepsis Labs Invalid input(s): PROCALCITONIN,  WBC,  LACTICIDVEN Microbiology Recent Results (from the past 240 hour(s))  Culture, blood (routine x 2)     Status: None (Preliminary result)   Collection Time: 10/20/20 11:07 PM   Specimen: BLOOD  Result Value Ref Range Status   Specimen Description   Final    BLOOD SITE NOT SPECIFIED Performed at [facility], [address] [place], [place] [address]    Special Requests   Final    BOTTLES DRAWN AEROBIC  AND ANAEROBIC Blood Culture adequate volume Performed at [facility], [address] [place], [place] [address]    Culture   Final    NO GROWTH 3 DAYS Performed at [facility], [address] [place], [place] [address]    Report Status PENDING  Incomplete  Culture, blood (routine x 2)     Status: None (Preliminary result)   Collection Time: 10/20/20 11:07 PM   Specimen: BLOOD  Result Value Ref Range Status   Specimen Description   Final    BLOOD SITE NOT SPECIFIED Performed at [facility], [address], [place], [place] [address]    Special Requests   Final    BOTTLES DRAWN AEROBIC AND ANAEROBIC Blood Culture adequate volume Performed at [facility], [address], [place], [place] [address]    Culture   Final    NO GROWTH 3 DAYS Performed at [facility], [address], [place], [place] [address]    Report Status PENDING  Incomplete  Resp Panel by RT-PCR (Flu A&B, Covid) Nasopharyngeal Swab     Status: None   Collection Time: 10/20/20 11:08 PM   Specimen: Nasopharyngeal Swab; Nasopharyngeal(NP) swabs in vial  transport medium  Result Value Ref Range Status   SARS Coronavirus 2 by RT PCR NEGATIVE NEGATIVE Final    Comment: (NOTE) SARS-CoV-2 target nucleic acids are NOT DETECTED.  The SARS-CoV-2 RNA is generally detectable in upper respiratory specimens during the acute phase of infection. The lowest concentration of SARS-CoV-2 viral copies this assay can detect is 138 copies/mL. A negative result does not preclude SARS-Cov-2 infection and should not be used as the sole basis for treatment or other patient management decisions. A negative result may occur with  improper specimen collection/handling, submission of specimen other than nasopharyngeal swab, presence of viral mutation(s) within the areas targeted by this assay, and inadequate number of viral copies(<138 copies/mL). A negative result must be combined with clinical observations, patient history, and epidemiological information. The expected result is Negative.  Fact Sheet for Patients:  BloggerCourse.com  Fact Sheet for Healthcare Providers:  SeriousBroker.it  This test is no t yet approved or cleared by the [place] FDA and  has been authorized for detection and/or diagnosis of SARS-CoV-2 by FDA under an Emergency Use Authorization (EUA). This EUA will remain  in effect (meaning this test can be used) for the duration of the COVID-19 declaration under Section 564(b)(1) of the Act, 21 U.S.C.section 360bbb-3(b)(1), unless the authorization is terminated  or revoked sooner.       Influenza A by PCR NEGATIVE NEGATIVE Final   Influenza B by PCR NEGATIVE NEGATIVE Final    Comment: (NOTE) The Xpert Xpress SARS-CoV-2/FLU/RSV plus assay is intended as an aid in the diagnosis of influenza from Nasopharyngeal swab specimens and should not be used as a sole basis for treatment. Nasal washings and aspirates are unacceptable for Xpert Xpress SARS-CoV-2/FLU/RSV testing.  Fact  Sheet for Patients: BloggerCourse.com  Fact Sheet for Healthcare Providers: SeriousBroker.it  This test is not yet approved or cleared by the [place] FDA and has been authorized for detection and/or diagnosis of SARS-CoV-2 by FDA under an Emergency Use Authorization (EUA). This EUA will remain in effect (meaning this test can be used) for the duration of the COVID-19 declaration under Section 564(b)(1) of the Act, 21 U.S.C. section 360bbb-3(b)(1), unless the authorization is terminated or revoked.  Performed at [facility], [address], [place], [place] [address]   MRSA PCR Screening     Status: None   Collection Time: 10/21/20  6:26 AM   Specimen: Nasal Mucosa; Nasopharyngeal  Result Value Ref Range Status   MRSA by PCR NEGATIVE NEGATIVE Final    Comment:        The GeneXpert MRSA Assay (FDA approved for NASAL specimens only), is one component of a comprehensive MRSA colonization surveillance program. It is not intended to diagnose MRSA infection nor to guide or monitor treatment for MRSA infections. Performed at [facility], [address], [place], [place] [address]      Time coordinating discharge: 40 minutes  SIGNED:   Dorcas Carrow, MD  [facility] 10/24/2020, 12:49 PM

## 2020-10-25 ENCOUNTER — Telehealth: Payer: Self-pay

## 2020-10-25 NOTE — Telephone Encounter (Signed)
Transition Care Management Follow-up Telephone Call  Call completed with patient's step son- Mason Hart.  DPR on file to speak with him.   Date of discharge and from where:10/24/2020, [name]   How have you been since you were released from the hospital? His grandson stated that he is doing pretty good.   Any questions or concerns? No  Items Reviewed:  Did the pt receive and understand the discharge instructions provided? Mason Hart said that he did not have them available at the time of this call.  instructed him to review the paperwork when he is able and to call this clinic with any questions.   Medications obtained and verified? Mason Hart said that the patient has the keppra. he will need to call [name] to check on the order for ferrous sulfate.  he also said that he needs to get the multivitamins. no questions about the med regime.   Other? No   Any new allergies since your discharge? No   Do you have support at home? Yes , he has a roommate  Home Care and Equipment/Supplies: Were home health services ordered?yes If so, what is the name of the agency? [facility] Has the agency set up a time to come to the patient's home? Yes, PT is coming tomorrow - 10/26/2020 Were any new equipment or medical supplies ordered?  No What is the name of the medical supply agency? n/a Were you able to get the supplies/equipment? N/a Do you have any questions related to the use of the equipment or supplies? No, n/a  Functional Questionnaire: (I = Independent and D = Dependent) ADLs: independent, has walker but does not use it all of the time. His roommate oversees medication management and makes sure he takes his keppra as prescribed.    Follow up appointments reviewed:   PCP Hospital f/u appt confirmed? Yes  Mason [place], NP 11/08/2020.  Specialist Hospital f/u appt confirmed? No , none scheduled  at this time  Are transportation arrangements  needed? No   If their condition worsens, is the pt aware to call PCP or go to the Emergency Dept.? yes  Was the patient provided with contact information for the PCP's office or ED?   His step son has the phone number for the clinic  Was to pt encouraged to call back with questions or concerns? yes

## 2020-10-26 ENCOUNTER — Telehealth: Payer: Self-pay | Admitting: Nurse Practitioner

## 2020-10-26 LAB — CULTURE, BLOOD (ROUTINE X 2)
Culture: NO GROWTH
Culture: NO GROWTH
Special Requests: ADEQUATE
Special Requests: ADEQUATE

## 2020-10-26 NOTE — Telephone Encounter (Signed)
Call returned to [facility], message left with call back to this CM

## 2020-10-26 NOTE — Telephone Encounter (Signed)
Mason Hart, from [facility], called stating that they are not able to admit pt hh for services. She states that the pts need is beyond the hh services. She states that the pts home is infested and they are sending a referral to [facility]. Please advise.     [phone]

## 2020-10-27 NOTE — Telephone Encounter (Signed)
Call placed to [place], PT.  She said that the apartment is infested with bedbugs and roaches and the patient reports bed bug bites on his arm.  She has contacted [facility] about the home environment and they informed her that they are not able to assist/ open a case.   She also explained that she had a long discussion with the patient's step son about the home environment.  He told her that he was not aware of how bad the situation was. He said that the patient's roommate is a friend of the family. The step son said that he usually would just drop items off at the house or pick up the patient for appointments, he did not go in the house.  The step son said that finances are not an issue and he would plan to have the patient stay in a motel until they are able to find an acceptable apartment.   Mason Hart said that the patient did have his keppra in his home  She also said that [facility] will not be seeing the patient due to the home environment which they feel is not safe.

## 2020-10-27 NOTE — Telephone Encounter (Signed)
Jerilynn returned your call.

## 2020-10-28 NOTE — Telephone Encounter (Signed)
Noted  

## 2020-11-08 ENCOUNTER — Ambulatory Visit: Payer: BC Managed Care – PPO | Admitting: Nurse Practitioner

## 2021-01-02 ENCOUNTER — Inpatient Hospital Stay (HOSPITAL_COMMUNITY)
Admission: EM | Admit: 2021-01-02 | Discharge: 2021-01-13 | DRG: 193 | Disposition: A | Discharge status: Skilled Nursing Facility | Payer: Medicare Other | Attending: Family Medicine | Admitting: Family Medicine

## 2021-01-02 ENCOUNTER — Emergency Department (HOSPITAL_COMMUNITY): Payer: Medicare Other

## 2021-01-02 ENCOUNTER — Emergency Department (HOSPITAL_BASED_OUTPATIENT_CLINIC_OR_DEPARTMENT_OTHER): Payer: Medicare Other

## 2021-01-02 DIAGNOSIS — E876 Hypokalemia: Secondary | ICD-10-CM | POA: Diagnosis not present

## 2021-01-02 DIAGNOSIS — I5189 Other ill-defined heart diseases: Secondary | ICD-10-CM | POA: Diagnosis not present

## 2021-01-02 DIAGNOSIS — D6959 Other secondary thrombocytopenia: Secondary | ICD-10-CM | POA: Diagnosis present

## 2021-01-02 DIAGNOSIS — D638 Anemia in other chronic diseases classified elsewhere: Secondary | ICD-10-CM | POA: Diagnosis present

## 2021-01-02 DIAGNOSIS — D696 Thrombocytopenia, unspecified: Secondary | ICD-10-CM | POA: Diagnosis not present

## 2021-01-02 DIAGNOSIS — E860 Dehydration: Secondary | ICD-10-CM | POA: Diagnosis present

## 2021-01-02 DIAGNOSIS — E8770 Fluid overload, unspecified: Secondary | ICD-10-CM | POA: Diagnosis present

## 2021-01-02 DIAGNOSIS — F10231 Alcohol dependence with withdrawal delirium: Secondary | ICD-10-CM | POA: Diagnosis not present

## 2021-01-02 DIAGNOSIS — E8809 Other disorders of plasma-protein metabolism, not elsewhere classified: Secondary | ICD-10-CM | POA: Diagnosis present

## 2021-01-02 DIAGNOSIS — Z66 Do not resuscitate: Secondary | ICD-10-CM | POA: Diagnosis present

## 2021-01-02 DIAGNOSIS — R4182 Altered mental status, unspecified: Secondary | ICD-10-CM | POA: Diagnosis not present

## 2021-01-02 DIAGNOSIS — E43 Unspecified severe protein-calorie malnutrition: Secondary | ICD-10-CM | POA: Diagnosis present

## 2021-01-02 DIAGNOSIS — Z7289 Other problems related to lifestyle: Secondary | ICD-10-CM

## 2021-01-02 DIAGNOSIS — J811 Chronic pulmonary edema: Secondary | ICD-10-CM | POA: Diagnosis present

## 2021-01-02 DIAGNOSIS — G9349 Other encephalopathy: Secondary | ICD-10-CM | POA: Diagnosis present

## 2021-01-02 DIAGNOSIS — E878 Other disorders of electrolyte and fluid balance, not elsewhere classified: Secondary | ICD-10-CM | POA: Diagnosis present

## 2021-01-02 DIAGNOSIS — Z20822 Contact with and (suspected) exposure to covid-19: Secondary | ICD-10-CM | POA: Diagnosis present

## 2021-01-02 DIAGNOSIS — E162 Hypoglycemia, unspecified: Secondary | ICD-10-CM | POA: Diagnosis present

## 2021-01-02 DIAGNOSIS — N179 Acute kidney failure, unspecified: Secondary | ICD-10-CM | POA: Diagnosis present

## 2021-01-02 DIAGNOSIS — I9589 Other hypotension: Secondary | ICD-10-CM | POA: Diagnosis not present

## 2021-01-02 DIAGNOSIS — R531 Weakness: Secondary | ICD-10-CM

## 2021-01-02 DIAGNOSIS — R0902 Hypoxemia: Secondary | ICD-10-CM | POA: Diagnosis not present

## 2021-01-02 DIAGNOSIS — I10 Essential (primary) hypertension: Secondary | ICD-10-CM | POA: Diagnosis present

## 2021-01-02 DIAGNOSIS — E870 Hyperosmolality and hypernatremia: Secondary | ICD-10-CM | POA: Diagnosis present

## 2021-01-02 DIAGNOSIS — F1020 Alcohol dependence, uncomplicated: Secondary | ICD-10-CM | POA: Diagnosis present

## 2021-01-02 DIAGNOSIS — E039 Hypothyroidism, unspecified: Secondary | ICD-10-CM | POA: Diagnosis present

## 2021-01-02 DIAGNOSIS — R609 Edema, unspecified: Secondary | ICD-10-CM

## 2021-01-02 DIAGNOSIS — G40909 Epilepsy, unspecified, not intractable, without status epilepticus: Secondary | ICD-10-CM | POA: Diagnosis present

## 2021-01-02 DIAGNOSIS — R9389 Abnormal findings on diagnostic imaging of other specified body structures: Secondary | ICD-10-CM

## 2021-01-02 DIAGNOSIS — J9601 Acute respiratory failure with hypoxia: Secondary | ICD-10-CM | POA: Diagnosis not present

## 2021-01-02 DIAGNOSIS — M6282 Rhabdomyolysis: Secondary | ICD-10-CM | POA: Diagnosis present

## 2021-01-02 DIAGNOSIS — R680 Hypothermia, not associated with low environmental temperature: Secondary | ICD-10-CM | POA: Diagnosis present

## 2021-01-02 DIAGNOSIS — J189 Pneumonia, unspecified organism: Secondary | ICD-10-CM | POA: Diagnosis present

## 2021-01-02 DIAGNOSIS — I959 Hypotension, unspecified: Secondary | ICD-10-CM | POA: Diagnosis not present

## 2021-01-02 DIAGNOSIS — R627 Adult failure to thrive: Secondary | ICD-10-CM | POA: Diagnosis present

## 2021-01-02 DIAGNOSIS — Z6826 Body mass index (BMI) 26.0-26.9, adult: Secondary | ICD-10-CM

## 2021-01-02 DIAGNOSIS — Z789 Other specified health status: Secondary | ICD-10-CM | POA: Diagnosis not present

## 2021-01-02 DIAGNOSIS — Z515 Encounter for palliative care: Secondary | ICD-10-CM | POA: Diagnosis not present

## 2021-01-02 DIAGNOSIS — B888 Other specified infestations: Secondary | ICD-10-CM | POA: Diagnosis present

## 2021-01-02 DIAGNOSIS — R0602 Shortness of breath: Secondary | ICD-10-CM

## 2021-01-02 DIAGNOSIS — Z8249 Family history of ischemic heart disease and other diseases of the circulatory system: Secondary | ICD-10-CM

## 2021-01-02 DIAGNOSIS — E861 Hypovolemia: Secondary | ICD-10-CM | POA: Diagnosis present

## 2021-01-02 DIAGNOSIS — R296 Repeated falls: Secondary | ICD-10-CM | POA: Diagnosis present

## 2021-01-02 DIAGNOSIS — Z7189 Other specified counseling: Secondary | ICD-10-CM | POA: Diagnosis not present

## 2021-01-02 DIAGNOSIS — A419 Sepsis, unspecified organism: Secondary | ICD-10-CM | POA: Diagnosis not present

## 2021-01-02 DIAGNOSIS — D649 Anemia, unspecified: Secondary | ICD-10-CM | POA: Diagnosis not present

## 2021-01-02 LAB — CBC WITH DIFFERENTIAL/PLATELET
Abs Immature Granulocytes: 0.05 10*3/uL (ref 0.00–0.07)
Basophils Absolute: 0 10*3/uL (ref 0.0–0.1)
Basophils Relative: 0 %
Eosinophils Absolute: 0.2 10*3/uL (ref 0.0–0.5)
Eosinophils Relative: 2 %
HCT: 20.9 % — ABNORMAL LOW (ref 39.0–52.0)
Hemoglobin: 5.9 g/dL — CL (ref 13.0–17.0)
Immature Granulocytes: 0 %
Lymphocytes Relative: 7 %
Lymphs Abs: 0.9 10*3/uL (ref 0.7–4.0)
MCH: 23.9 pg — ABNORMAL LOW (ref 26.0–34.0)
MCHC: 28.2 g/dL — ABNORMAL LOW (ref 30.0–36.0)
MCV: 84.6 fL (ref 80.0–100.0)
Monocytes Absolute: 0.4 10*3/uL (ref 0.1–1.0)
Monocytes Relative: 3 %
Neutro Abs: 10.9 10*3/uL — ABNORMAL HIGH (ref 1.7–7.7)
Neutrophils Relative %: 88 %
Platelets: 75 10*3/uL — ABNORMAL LOW (ref 150–400)
RBC: 2.47 MIL/uL — ABNORMAL LOW (ref 4.22–5.81)
RDW: 25.9 % — ABNORMAL HIGH (ref 11.5–15.5)
WBC: 12.4 10*3/uL — ABNORMAL HIGH (ref 4.0–10.5)
nRBC: 0.3 % — ABNORMAL HIGH (ref 0.0–0.2)

## 2021-01-02 LAB — COMPREHENSIVE METABOLIC PANEL
ALT: 28 U/L (ref 0–44)
AST: 59 U/L — ABNORMAL HIGH (ref 15–41)
Albumin: 2 g/dL — ABNORMAL LOW (ref 3.5–5.0)
Alkaline Phosphatase: 59 U/L (ref 38–126)
Anion gap: 10 (ref 5–15)
BUN: 27 mg/dL — ABNORMAL HIGH (ref 8–23)
CO2: 22 mmol/L (ref 22–32)
Calcium: 8.4 mg/dL — ABNORMAL LOW (ref 8.9–10.3)
Chloride: 118 mmol/L — ABNORMAL HIGH (ref 98–111)
Creatinine, Ser: 1.16 mg/dL (ref 0.61–1.24)
GFR, Estimated: >60 mL/min (ref >60)
Glucose, Bld: 115 mg/dL — ABNORMAL HIGH (ref 70–99)
Potassium: 3.7 mmol/L (ref 3.5–5.1)
Sodium: 150 mmol/L — ABNORMAL HIGH (ref 135–145)
Total Bilirubin: 0.7 mg/dL (ref 0.3–1.2)
Total Protein: 6.1 g/dL — ABNORMAL LOW (ref 6.5–8.1)

## 2021-01-02 LAB — BRAIN NATRIURETIC PEPTIDE: B Natriuretic Peptide: 98.5 pg/mL (ref 0.0–100.0)

## 2021-01-02 LAB — CBG MONITORING, ED: Glucose-Capillary: 105 mg/dL — ABNORMAL HIGH (ref 70–99)

## 2021-01-02 LAB — PREPARE RBC (CROSSMATCH)

## 2021-01-02 LAB — ETHANOL: Alcohol, Ethyl (B): <10 mg/dL (ref <10)

## 2021-01-02 LAB — CK: Total CK: 824 U/L — ABNORMAL HIGH (ref 49–397)

## 2021-01-02 LAB — SARS CORONAVIRUS 2 (TAT 6-24 HRS): SARS Coronavirus 2: NEGATIVE

## 2021-01-02 LAB — AMMONIA: Ammonia: 27 umol/L (ref 9–35)

## 2021-01-02 LAB — LACTIC ACID, PLASMA: Lactic Acid, Venous: 1.8 mmol/L (ref 0.5–1.9)

## 2021-01-02 MED ORDER — FOLIC ACID 1 MG PO TABS
1.0000 mg | ORAL_TABLET | Freq: Every day | ORAL | Status: DC
  Administered 2021-01-05 – 2021-01-13 (×9): 1 mg via ORAL
  Filled 2021-01-02 (×9): qty 1

## 2021-01-02 MED ORDER — LORAZEPAM 2 MG/ML IJ SOLN: 1.0000 mg | INTRAMUSCULAR | Status: AC | PRN

## 2021-01-02 MED ORDER — SODIUM CHLORIDE 0.9% IV SOLUTION: Freq: Once | INTRAVENOUS | Status: AC

## 2021-01-02 MED ORDER — ADULT MULTIVITAMIN W/MINERALS CH
1.0000 | ORAL_TABLET | Freq: Every day | ORAL | Status: DC
  Administered 2021-01-05 – 2021-01-13 (×9): 1 via ORAL
  Filled 2021-01-02 (×9): qty 1

## 2021-01-02 MED ORDER — SODIUM CHLORIDE 0.9 % IV SOLN: 500.0000 mg | Freq: Every day | INTRAVENOUS | Status: DC

## 2021-01-02 MED ORDER — THIAMINE HCL 100 MG PO TABS
100.0000 mg | ORAL_TABLET | Freq: Every day | ORAL | Status: DC
  Administered 2021-01-07 – 2021-01-13 (×7): 100 mg via ORAL
  Filled 2021-01-02 (×7): qty 1

## 2021-01-02 MED ORDER — LEVETIRACETAM 500 MG PO TABS
1000.0000 mg | ORAL_TABLET | Freq: Two times a day (BID) | ORAL | Status: DC
  Administered 2021-01-03: 1000 mg via ORAL
  Filled 2021-01-02: qty 2

## 2021-01-02 MED ORDER — DIPHENHYDRAMINE-ZINC ACETATE 2-0.1 % EX CREA: TOPICAL_CREAM | Freq: Three times a day (TID) | CUTANEOUS | Status: DC | PRN

## 2021-01-02 MED ORDER — SODIUM CHLORIDE 0.45 % IV SOLN: INTRAVENOUS | Status: DC

## 2021-01-02 MED ORDER — THIAMINE HCL 100 MG/ML IJ SOLN
100.0000 mg | Freq: Every day | INTRAMUSCULAR | Status: DC
  Administered 2021-01-03 – 2021-01-06 (×3): 100 mg via INTRAVENOUS
  Filled 2021-01-02 (×3): qty 2

## 2021-01-02 MED ORDER — THIAMINE HCL 100 MG/ML IJ SOLN: 100.0000 mg | Freq: Every day | INTRAMUSCULAR | Status: DC

## 2021-01-02 MED ORDER — SODIUM CHLORIDE 0.9 % IV SOLN
1.0000 g | Freq: Once | INTRAVENOUS | Status: AC
  Administered 2021-01-02: 1 g via INTRAVENOUS
  Filled 2021-01-02: qty 10

## 2021-01-02 MED ORDER — SODIUM CHLORIDE 0.9 % IV SOLN
500.0000 mg | Freq: Once | INTRAVENOUS | Status: AC
  Administered 2021-01-02: 500 mg via INTRAVENOUS
  Filled 2021-01-02: qty 500

## 2021-01-02 MED ORDER — SODIUM CHLORIDE 0.9 % IV SOLN: 2.0000 g | Freq: Every day | INTRAVENOUS | Status: DC

## 2021-01-02 MED ORDER — ADULT MULTIVITAMIN W/MINERALS CH: 1.0000 | ORAL_TABLET | Freq: Every day | ORAL | Status: DC

## 2021-01-02 MED ORDER — LACTATED RINGERS IV BOLUS
1000.0000 mL | Freq: Once | INTRAVENOUS | Status: AC
  Administered 2021-01-02: 1000 mL via INTRAVENOUS

## 2021-01-02 MED ORDER — THIAMINE HCL 100 MG PO TABS: 100.0000 mg | ORAL_TABLET | Freq: Every day | ORAL | Status: DC

## 2021-01-02 MED ORDER — ACETAMINOPHEN 325 MG PO TABS: 650.0000 mg | ORAL_TABLET | Freq: Four times a day (QID) | ORAL | Status: DC | PRN

## 2021-01-02 MED ORDER — ACETAMINOPHEN 650 MG RE SUPP: 650.0000 mg | Freq: Four times a day (QID) | RECTAL | Status: DC | PRN

## 2021-01-02 MED ORDER — LORAZEPAM 1 MG PO TABS: 1.0000 mg | ORAL_TABLET | ORAL | Status: AC | PRN

## 2021-01-02 MED ORDER — FOLIC ACID 1 MG PO TABS: 1.0000 mg | ORAL_TABLET | Freq: Every day | ORAL | Status: DC

## 2021-01-02 NOTE — ED Notes (Signed)
IV team at beside 

## 2021-01-02 NOTE — ED Provider Notes (Signed)
[facility] EMERGENCY DEPARTMENT Provider Note   CSN: [id number] Arrival date & time: 01/02/21  1548     History Chief Complaint  Patient presents with  . Weakness  . Bradycardia    Mason Hart is a 65 y.o. male.  Pt is a 64y/o male with hx of alcoholism (still currently drinking), hypertension, hypothyroidism, seizure disorder who is presenting today at home after an unwitnessed fall and unknown downtime with worsening generalized weakness and inability to walk.  Patient's stepson reports that for a while now patient has had a decline in his functional status.  He is still drinking but only what ever his roommate brings in for him.  The stepson reports that he has been concerned about the patient's living conditions for a while.  He will asked the patient if he is bathing and he says yes but he was concerned that he has not been bathed in weeks.  The house is overrun with roaches and bedbugs.  He has ongoing issues with his skin.  The stepson does bring him his groceries every week and reports he is sure that he is eating but he has been losing weight.  He normally ambulates with a walker but today when he went over to check on him he was laying in the floor.  He was unable to get up or to walk.  He reports that he was awake but he did not seem his normal self and his speech seemed slow.  Since his last hospitalization at the end of November when he was found outside lying in the grass after falling when he was going to the store to get alcohol he has had worsening swelling of his right arm and left leg.  On exam patient denies any headache, chest pain or shortness of breath.  He does complain of weakness.  He reports he has been taking his medications.  He does not take anticoagulation.  He cannot recall why he fell today.  Patient also noted to have a cough on exam and he reports its been there for a while  The history is provided by the patient, a relative and the EMS  personnel.       Past Medical History:  Diagnosis Date  . Alcohol abuse   . Hypertension   . Hypothyroidism   . Noncompliance with medication regimen   . RBBB (right bundle branch block)   . Seizures [facility])     Patient Active Problem List   Diagnosis Date Noted  . Lactic acidosis 10/21/2020  . Metabolic acidosis 10/21/2020  . Hepatic cirrhosis (HCC) 05/26/2020  . Seizure disorder (HCC) 04/29/2020  . Gait abnormality 04/29/2020  . Acute respiratory failure (HCC) 04/13/2020  . Alcohol intoxication with delirium (HCC) 06/04/2019  . Pressure injury of skin 03/19/2019  . Breakthrough seizure (HCC) 03/18/2019  . Edema of left lower extremity 03/18/2019  . Chronic anemia 03/18/2019  . Anemia, normocytic normochromic 12/05/2018  . Endotracheally intubated 12/05/2018  . Hypothermia 12/05/2018  . Hyponatremia 12/05/2018  . Postictal state (HCC) 12/05/2018  . Sinusitis 12/05/2018  . Abnormal CXR 12/05/2018  . Hypotension 12/05/2018  . Bradycardia 12/05/2018  . ARF (acute renal failure) (HCC) 11/14/2018  . Clavicle fracture 11/14/2018  . Hypokalemia 06/24/2017  . Hypoglycemia 06/24/2017  . Alcohol use 06/24/2017  . Seizure (HCC) 06/24/2017  . Noncompliance with medication regimen   . Seizures (HCC)     Past Surgical History:  Procedure Laterality Date  . NO PAST SURGERIES  Family History  Problem Relation Age of Onset  . Heart disease Mother   . Other Father        unsure of medical history  . Cancer Neg Hx   . Diabetes Neg Hx   . Stroke Neg Hx     Social History   Tobacco Use  . Smoking status: Never Smoker  . Smokeless tobacco: Never Used  Vaping Use  . Vaping Use: Never used  Substance Use Topics  . Alcohol use: Yes    Comment: 1-2 beers per day- 42 oz, occasional liquor  . Drug use: No    Home Medications Prior to Admission medications   Medication Sig Start Date End Date Taking? Authorizing Provider  ferrous sulfate 325 (65 FE) MG tablet  Take 1 tablet (325 mg total) by mouth daily. 10/24/20 10/24/21  Dorcas Carrow, MD  levETIRAcetam (KEPPRA) 1000 MG tablet Take 1 tablet (1,000 mg total) by mouth 2 (two) times daily. Last fill! Must keep appointment. 04/29/20 07/23/21  Levert Feinstein, MD    Allergies    Patient has no known allergies.  Review of Systems   Review of Systems  All other systems reviewed and are negative.   Physical Exam Updated Vital Signs BP 116/69 (BP Location: Right Arm)   Pulse 77   Temp 98.4 F (36.9 C) (Oral)   Resp 20   Ht 6\' 1"  (1.854 m)   Wt 91.9 kg   SpO2 100%   BMI 26.73 kg/m   Physical Exam Vitals and nursing note reviewed.  Constitutional:      General: He is not in acute distress.    Appearance: He is well-developed and well-nourished.     Comments: Disheveled, dirty clothes, bed bugs crawling all over his skin and clothing  HENT:     Head: Normocephalic.      Mouth/Throat:     Mouth: Oropharynx is clear and moist.  Eyes:     Extraocular Movements: EOM normal.     Conjunctiva/sclera: Conjunctivae normal.     Pupils: Pupils are equal, round, and reactive to light.  Cardiovascular:     Rate and Rhythm: Normal rate and regular rhythm.     Pulses: Intact distal pulses.     Heart sounds: No murmur heard.   Pulmonary:     Effort: Pulmonary effort is normal. No respiratory distress.     Breath sounds: Normal breath sounds. No wheezing or rales.  Abdominal:     General: There is no distension.     Palpations: Abdomen is soft.     Tenderness: There is no abdominal tenderness. There is no guarding or rebound.  Musculoskeletal:        General: No tenderness. Normal range of motion.     Cervical back: Normal range of motion and neck supple.     Left lower leg: Edema present.     Comments: Significant edema noted in the right upper extremity as well as the left lower extremity  Skin:    General: Skin is warm and dry.     Findings: Rash present. No erythema.     Comments: Thick  excoriated skin with areas that have been bleeding present over the torso, upper and lower extremities.  No purulent discharge but skin is thickened and discolored  Neurological:     Mental Status: He is alert.     Comments: Awake and alert.  Oriented to self and place.  Patient is able to follow commands.  4 out of 5  strength noted in the right upper extremity and left lower extremity.  5 out of 5 strength in the left upper extremity and right lower extremity.  No asterixis.  No notable facial droop.  Speech is slow but purposeful.  No slurred speech noted  Psychiatric:        Mood and Affect: Mood and affect normal.     Comments: Calm and cooperative but quiet      ED Results / Procedures / Treatments   Labs (all labs ordered are listed, but only abnormal results are displayed) Labs Reviewed  CBC WITH DIFFERENTIAL/PLATELET - Abnormal; Notable for the following components:      Result Value   WBC 12.4 (*)    RBC 2.47 (*)    Hemoglobin 5.9 (*)    HCT 20.9 (*)    MCH 23.9 (*)    MCHC 28.2 (*)    RDW 25.9 (*)    Platelets 75 (*)    nRBC 0.3 (*)    Neutro Abs 10.9 (*)    All other components within normal limits  COMPREHENSIVE METABOLIC PANEL - Abnormal; Notable for the following components:   Sodium 150 (*)    Chloride 118 (*)    Glucose, Bld 115 (*)    BUN 27 (*)    Calcium 8.4 (*)    Total Protein 6.1 (*)    Albumin 2.0 (*)    AST 59 (*)    All other components within normal limits  CK - Abnormal; Notable for the following components:   Total CK 824 (*)    All other components within normal limits  CBG MONITORING, ED - Abnormal; Notable for the following components:   Glucose-Capillary 105 (*)    All other components within normal limits  SARS CORONAVIRUS 2 (TAT 6-24 HRS)  ETHANOL  LACTIC ACID, PLASMA  AMMONIA  URINALYSIS, ROUTINE W REFLEX MICROSCOPIC  BRAIN NATRIURETIC PEPTIDE  TYPE AND SCREEN  PREPARE RBC (CROSSMATCH)    EKG EKG  Interpretation  Date/Time:  Sunday January 02 2021 16:18:50 EST Ventricular Rate:  46 PR Interval:    QRS Duration: 173 QT Interval:  631 QTC Calculation: 553 R Axis:   68 Text Interpretation: Sinus bradycardia Short PR interval Nonspecific intraventricular conduction delay Artifact in lead(s) I II aVR V1 V2 No significant change since last tracing Confirmed by Gwyneth Sprout ([id number]) on 01/02/2021 4:54:38 PM   Radiology DG Chest Port 1 View  Result Date: 01/02/2021 CLINICAL DATA:  Found down EXAM: PORTABLE CHEST 1 VIEW COMPARISON:  October 21, 2020 FINDINGS: The cardiomediastinal silhouette is unchanged in contour.Tortuous thoracic aorta. No pleural effusion. No pneumothorax. RIGHT midlung predominately linear reticulonodular opacities. Visualized abdomen is unremarkable. Remote LEFT clavicular fracture. Degenerative changes of the RIGHT shoulder. IMPRESSION: RIGHT midlung predominately linear reticulonodular opacities. Differential considerations include atelectasis, infection or artifact from superimposed osseous structures. This could be better assessed with dedicated PA and lateral chest radiograph. Electronically Signed   By: Meda Klinefelter MD   On: 01/02/2021 18:17   VAS [place] LOWER EXTREMITY VENOUS (DVT) ([facility] and [facility] 7a-7p)  Result Date: 01/02/2021  Lower Venous DVT Study Indications: Edema. Other Indications: Severe bed bugs. Limitations: Body habitus and poor ultrasound/tissue interface. Comparison Study: LLE 4/20 Negative Performing Technologist: Clint Guy RVT  Examination Guidelines: A complete evaluation includes B-mode imaging, spectral Doppler, color Doppler, and power Doppler as needed of all accessible portions of each vessel. Bilateral testing is considered an integral part of a complete examination. Limited examinations for  reoccurring indications may be performed as noted. The reflux portion of the exam is performed with the patient in reverse Trendelenburg.   +---------+---------------+---------+-----------+----------+--------------+ LEFT     CompressibilityPhasicitySpontaneityPropertiesThrombus Aging +---------+---------------+---------+-----------+----------+--------------+ CFV      Full           Yes      Yes                                 +---------+---------------+---------+-----------+----------+--------------+ SFJ      Full                                                        +---------+---------------+---------+-----------+----------+--------------+ FV Prox  Full                                                        +---------+---------------+---------+-----------+----------+--------------+ FV Mid   Full                                                        +---------+---------------+---------+-----------+----------+--------------+ FV DistalFull                                                        +---------+---------------+---------+-----------+----------+--------------+ PFV      Full                                                        +---------+---------------+---------+-----------+----------+--------------+ POP      Full           Yes      Yes                                 +---------+---------------+---------+-----------+----------+--------------+  Summary: LEFT: - There is no evidence of deep vein thrombosis in the lower extremity. However, portions of this examination were limited- see technologist comments above.  - No cystic structure found in the popliteal fossa.  *See table(s) above for measurements and observations.    Preliminary     Procedures Procedures   Medications Ordered in ED Medications - No data to display  ED Course  I have reviewed the triage vital signs and the nursing notes.  Pertinent labs & imaging results that were available during my care of the patient were reviewed by me and considered in my medical decision making (see chart for details).    MDM  Rules/Calculators/A&P                          Elderly gentleman presenting today with weakness.  Patient has had failure to thrive at home and has poor living conditions.  Patient is covered in bedbugs as well is having diffuse skin thickening, excoriation and irritation.  Whether this is all from localized reaction from bedbug bites versus eczema is unknown.  Patient does have significant swelling of the right upper extremity and left lower extremity.  No reported pain per the patient and lower suspicion for acute trauma.  However this has been gradually worsening per his stepson who is his POA and attempts to take care of the patient.  Patient is still drinking alcohol and unknown if the fall today was result of a seizure, syncope or a trip and fall.  Unknown if patient had any loss of consciousness.  He does not take any anticoagulation.  Unknown downtime.  Patient denies any complaints and stepson reports that he has been trying to get the patient out of this house but the patient has been very resistant to change anything.  Concern today for possible recurrent anemia as when patient was here 2 months ago he required 2 units versus new AKI due to failure to thrive versus exacerbation of known hepatic cirrhosis.  Also concern for possible DVT in the extremities that have worsening with swelling versus new acute stroke.  Labs, head CT, chest x-ray and EKG are pending.  Patient is not displaying any evidence of postictal period at this time but may need a Keppra bolus in the future.  He is not displaying any symptoms of withdrawal unknown when patient's last alcoholic beverage was.  6:29 PM Labs are consistent with recurrent anemia at 5.9 today and thrombocytopenia with a platelet count of 75 which is baseline appears to be 120.  Lactic acid today within normal limits, ammonia within normal limits, CMP with hypernatremia of 150 and hyperchloremia of 118 with normal renal function.  Mild elevation of AST  normal anion gap.  Alcohol level today is undetectable and CK is elevated at 824.  Patient's oxygen saturation has been normal on room air but he does have a wet sounding cough.  Chest x-ray does show right midlung predominantly reticulonodular opacity which could be atelectasis infection or superimposed artifact.  Given patient's cough and other issues will cover with Rocephin and azithromycin.  We will transfuse 2 units and give IV fluids.  We will plan on admission for further care.  Ultrasound negative for DVT in the left lower extremity.  MDM Number of Diagnoses or Management Options   Amount and/or Complexity of Data Reviewed Clinical lab tests: ordered and reviewed Tests in the radiology section of CPT: ordered and reviewed Tests in the medicine section of CPT: ordered and reviewed Decide to obtain previous medical records or to obtain history from someone other than the patient: yes Obtain history from someone other than the patient: yes Review and summarize past medical records: yes Discuss the patient with other providers: yes Independent visualization of images, tracings, or specimens: yes  Risk of Complications, Morbidity, and/or Mortality Presenting problems: high Diagnostic procedures: moderate Management options: moderate  Patient Progress Patient progress: stable  CRITICAL CARE Performed by: Randell Teare Total critical care time: 30 minutes Critical care time was exclusive of separately billable procedures and treating other patients. Critical care was necessary to treat or prevent imminent or life-threatening deterioration. Critical care was time spent personally by me on the following activities: development of treatment plan with patient and/or surrogate as well as nursing, discussions with consultants, evaluation of patient's response to treatment, examination  of patient, obtaining history from patient or surrogate, ordering and performing treatments and  interventions, ordering and review of laboratory studies, ordering and review of radiographic studies, pulse oximetry and re-evaluation of patient's condition.  Final Clinical Impression(s) / ED Diagnoses Final diagnoses:  Weakness  Symptomatic anemia  Thrombocytopenia (HCC)  Hypernatremia  Community acquired pneumonia of right lung, unspecified part of lung    Rx / DC Orders ED Discharge Orders    None       Gwyneth Sprout, MD 01/02/21 [phone]

## 2021-01-02 NOTE — ED Notes (Signed)
Vascular Lab tech called from [facility], she plans to see pt before 7.

## 2021-01-02 NOTE — ED Notes (Signed)
Pt socks removed. Heavily coated with dead skin.

## 2021-01-02 NOTE — H&P (Addendum)
Family Medicine Teaching [facility] Admission History and Physical Service Pager: [phone]  Patient name: Mason Hart Medical record number: [id number] Date of birth: 01/04/1956 Age: 65 y.o. Gender: male  Primary Care Provider: [name], NP Consultants: none Code Status: Full Preferred Emergency Contact: [name] [phone]  Chief Complaint: altered mental status and weakness   Assessment and Plan: Mason Hart is a 65 y.o. male presenting with extremity weakness and altered mental status. PMH is significant for alcohol use disorder, hypertension, hypothyroidism and seizure disorder.   Altered mental status  Weakness  Fall Patient has recent hospitalization in November 2021 after he was found down in the grass after walking to the liquor store, during this time was also noted to have anemia that required transfusion and IV iron which resolved his anemia and he was discharged. Today, patient apparently found unresponsive by stepson in his family friend's home where he is living with known fall. Patient found down for an uknown amount of time with unknown time of last known normal. Here in the ED he is complaining of generalized weakness.  Patient presented with Na 150, Ethanol <10, Hgb 5.9, WBC 12.4, ammonia wnl and lactic acid wnl. CK 824 on admission, concern for possible rhabdomyolysis given recent fall. S/p LR 1L in the ED. On exam, AOx1 and noted to have 5/5 UE and LE strength. Given patient's history, seizure is a possibility as patient's compliance is questionable. Has several admissions for seizure activity and being found unresponsive. Possibly postictal. Pending CT head and consider EEG. Obtain keppra level. Concern for subdural hematoma given fall. Also on the differential although low suspicion for CNS infection, tumor and possible hemorrhage or subdural hematoma which CT head will determine. Unclear as to whether patient had a syncopal episode, no known history of  atrial fibrillation or arrhythmia. Encephalopathic causes potentially due to electrolyte imbalance or hepatic encephalopathy, awaiting CT head. Electrolyte abnormalities including hypernatremia could cause a degree of confusion as well. Continue with further pending testing and monitor. Decontamination delayed ability for patient to get CT head in a timely manner.  -Admit to FPTS, attending Dr. Jennette Kettle -pending CT head -125 ml/hr 0.45 NS -seizure precautions -delirium precautions  -f/u UA and UDS -f/u am labs -consider echo -PT/OT evaluation -SLP evaluation  -up with assistance  -monitor mental status    Normocytic anemia Hgb 5.9, MCV 84. Pt given 1 unit pRBC in the ED. Likely secondary multifactorial due to bedbugs (see below) and extensive excoriations and possibly due to iron deficiency anemia or vitamin defiency given alcoholic history. No known source of bleeding. Patient may benefit from IV iron. Currently receiving 1 unit pRBC. Unknown if patient has history of CAD, transfusion threshold for now is 7.  -f/u Hgb unit pRBC -f/u anemia panel  -consider IV iron if appropriate  -transfuse Hgb <7   Leukocytosis  WBC 12.4 with CXR notable right midlung opacity. COVID negative on presentation. Concern for infectious etiology given imaging and leukocytosis, with possible CAP. Also possibility of seizure causing elevated WBC. CXR notable for right midlung opacity with concern for possible pneumonia. Given azithromycin and 1 dose of ceftriaxone in the ED. -ceftriaxone for total 5 day course -azithromycin for total 3 day course  -pending repeat CXR for better imaging   RUE and LLE edema Noted to have non-pitting RUE and LLE edema on exam. Localized edematous presentation possiblly lymphedema vs DVT. Given concern for DVT, left LE US performed and demonstrated no concern for DVT.  Will obtain  RUE doppler. -pending right UE [place]   Thrombocytopenia  On admission platelet count of 75, appears to  be down trending per chart review from prior hospitalizations, likely secondary to extensive history of alcohol use. Baseline appears 100-120 since 02/2019.  -discontinue Lovenox at <50 -continue to monitor CBC   Bedbugs Infestation of bed bugs located systemically in the setting of poor living situation. Serveral bedbugs seen on patient in ED. Multiple hyperpigmented areas and excoriations with pinpoint areas of bleeding along with areas of thickened skin noted on exam. Decontamination to be performed.  -topical benadryl  - bathe patient -order placed to bathe patient  -contact precautions in place  History of alcoholism  Cirrhosis  Elevated transaminase  On admission ethanol <10, patient's social situation enables him to still consume alcohol regularly. Mildly elevated AST 59 and ALT wnl, likely due to chronic alcoholism causing hepatic dysfunction. Will continue to monitor for signs of withdrawal.  -monitor CIWAs with Ativan per protocol -f/u CMP -pending PT-INR and PTT -calculate MELD score once INR returns  -seizure precautions  -thiamine -folic acid -multivitamin   HTN Normotensive BP on admission at 116/69. No known home medications. -monitor BP, add anti-hypertensive as needed.  Hypothyroidism  Per chart review, TSH wnl from 04/2020 and was previously on 25 mcg Synthroid. Not listed as a home medication.   -pending TSH - restart Synthroid if needed   Seizure disorder No seizure-like activity reported however pt was found down. Several admissions for seizure activity possibly related to alcohol withdrawal vs medication non-compliance.  Known history of seizure disorder but unclear as to most recent seizure episode given patient's limited history due to mental status changes. Home medications include Keppra 1000 mg which patient is noted to take. -consider neurology consult for possible EEG -pending Keppra level  -continue home Keppra (IV if needed)  Hypernatremia  Na 150,  likely secondary to dehydration given patient's poor social situation. Expected to improve with adequate hydration. Differentials include seizure, central diabetes insipidus, pituitary disorder and head trauma. Further workup pending including CT head. -125 ml/hr 0.45 NS -pending CT head -f/u BMP q6hr -consider work up including ADH and urine osmolality levels  Protein calorie malnutrition  Albumin 2.0 likely in the setting of protein calorie malnutrition given possible concern that patient may not be eating properly and maintaining adequate hydration.  -Nutrition consult -patient may benefit from nutrition supplement    Social concerns  Patient has ongoing poor living situation. Lives with family friend who is supposed to ensure that patient is properly caring for himself. Form last hospitalization, patient approved for home health but patient is not receiving these services as home health will not come into the house given poor living conditions. There are concerns that patient not properly taking care of himself and is still drinking with extensive alcohol history since family friend he lives with also consumes alcohol.  -attempt made to touch base with stepson and other emergency contacts available regarding history and events surrounding this admission but no answer, secure message left when able,  -reach out again in the morning  -TOC consult to ensure patient has safe disposition at discharge   FEN/GI: NPO pending SLP evaluation  Prophylaxis: SCDs  Disposition: admit to inpatient, attending Dr. Jennette Kettle      History of Present Illness:  Mason Hart is a 65 y.o. male presenting with generalized weakness and altered mental status. Patient is seen by health and wellness for primary care needs. He has a current poor living  situation where he lives with a family friend who is supposed to ensure that he is taking care of himself. Doreene Adas is very involved and comes to bring him food daily but  does not always come inside the house given poor living situation.   Per sign out report, patient ambulates with assistance of walker. Prior fall history is unclear. Doreene Adas found patient unresponsive on the ground of his place of residence. Concern that patient not maintaining adequate nutrition and hydration daily given his poor presentation on admission.   Noted to have a productive cough that started today but denies any phlegm. States that his arms and legs typically don't swell but he noticed his right arm and left leg swell up today. Denies fever, chills, recent sick contacts, hematochezia, hemoptysis, hematemesis and dysuria. Reports that he lives by himself and that he fell. Unsure of how he fell but states he has had multiple falls. All he remembers that his stepson brought him to the ED. Typically uses a walker to ambulate but has been using a wheelchair recently. Denies receiving COVID or flu vaccines. Endorses alcohol use about at least once a week or whenever he has money to buy it. Last drink was today, he drank beer and liquor.   Level V caveat: patient is poor historian due to mental status change    Review Of Systems: Per HPI with the following additions:   Review of Systems  Unable to perform ROS: Mental status change     Patient Active Problem List   Diagnosis Date Noted  . Lactic acidosis 10/21/2020  . Metabolic acidosis 10/21/2020  . Hepatic cirrhosis (HCC) 05/26/2020  . Seizure disorder (HCC) 04/29/2020  . Gait abnormality 04/29/2020  . Acute respiratory failure (HCC) 04/13/2020  . Alcohol intoxication with delirium (HCC) 06/04/2019  . Pressure injury of skin 03/19/2019  . Breakthrough seizure (HCC) 03/18/2019  . Edema of left lower extremity 03/18/2019  . Chronic anemia 03/18/2019  . Anemia, normocytic normochromic 12/05/2018  . Endotracheally intubated 12/05/2018  . Hypothermia 12/05/2018  . Hyponatremia 12/05/2018  . Postictal state (HCC) 12/05/2018  .  Sinusitis 12/05/2018  . Abnormal CXR 12/05/2018  . Hypotension 12/05/2018  . Bradycardia 12/05/2018  . ARF (acute renal failure) (HCC) 11/14/2018  . Clavicle fracture 11/14/2018  . Hypokalemia 06/24/2017  . Hypoglycemia 06/24/2017  . Alcohol use 06/24/2017  . Seizure (HCC) 06/24/2017  . Noncompliance with medication regimen   . Seizures (HCC)     Past Medical History: Past Medical History:  Diagnosis Date  . Alcohol abuse   . Hypertension   . Hypothyroidism   . Noncompliance with medication regimen   . RBBB (right bundle branch block)   . Seizures (HCC)     Past Surgical History: Past Surgical History:  Procedure Laterality Date  . NO PAST SURGERIES      Social History: Social History   Tobacco Use  . Smoking status: Never Smoker  . Smokeless tobacco: Never Used  Vaping Use  . Vaping Use: Never used  Substance Use Topics  . Alcohol use: Yes    Comment: 1-2 beers per day- 42 oz, occasional liquor  . Drug use: No   Additional social history:  Please also refer to relevant sections of EMR.  Family History: Family History  Problem Relation Age of Onset  . Heart disease Mother   . Other Father        unsure of medical history  . Cancer Neg Hx   . Diabetes Neg Hx   .  Stroke Neg Hx     Allergies and Medications: No Known Allergies No current facility-administered medications on file prior to encounter.   Current Outpatient Medications on File Prior to Encounter  Medication Sig Dispense Refill  . ferrous sulfate 325 (65 FE) MG tablet Take 1 tablet (325 mg total) by mouth daily. 30 tablet 3  . levETIRAcetam (KEPPRA) 1000 MG tablet Take 1 tablet (1,000 mg total) by mouth 2 (two) times daily. Last fill! Must keep appointment. 180 tablet 4    Objective: BP 107/63 (BP Location: Right Arm)   Pulse (!) 57   Temp 98.4 F (36.9 C) (Oral)   Resp 20   Ht 6\' 1"  (1.854 m)   Wt 91.9 kg   SpO2 100%   BMI 26.73 kg/m   Exam: General: Patient laying in bed, in  no acute distress. Eyes: no scleral icterus  Neck: supple, no evidence of lymphadenopathy  Cardiovascular: tachycardic, no murmurs or gallops auscultated Respiratory: lungs clear to auscultation bilaterally anteriorly, unable to assess posteriorly Gastrointestinal: soft, nontender, no evidence of ascites or fluid wave MSK: no calf tenderness noted bilaterally, significant edema noted along right UE and left LE, left pedal edema noted, radial and DP pulses palpable  Derm: skin cool to touch peripherally, thickened skin with excoriations systemically with bedbugs present, 1-2 cm abrasion noted above right eye and right parietal region  Neuro: AOx1 (self), incoherent speech at times, follows all commands, easily arousable to voice, 5/5 strength UE and LE bilaterally, gross sensation intact  Psych: no agitation noted   Labs and Imaging: CBC BMET  Recent Labs  Lab 01/02/21 1728  WBC 12.4*  HGB 5.9*  HCT 20.9*  PLT 75*   Recent Labs  Lab 01/02/21 1728  NA 150*  K 3.7  CL 118*  CO2 22  BUN 27*  CREATININE 1.16  GLUCOSE 115*  CALCIUM 8.4*     EKG: sinus bradycardia, HR, short PR interval   DG Chest Port 1 View  Result Date: 01/02/2021 CLINICAL DATA:  Found down EXAM: PORTABLE CHEST 1 VIEW COMPARISON:  October 21, 2020 FINDINGS: The cardiomediastinal silhouette is unchanged in contour.Tortuous thoracic aorta. No pleural effusion. No pneumothorax. RIGHT midlung predominately linear reticulonodular opacities. Visualized abdomen is unremarkable. Remote LEFT clavicular fracture. Degenerative changes of the RIGHT shoulder. IMPRESSION: RIGHT midlung predominately linear reticulonodular opacities. Differential considerations include atelectasis, infection or artifact from superimposed osseous structures. This could be better assessed with dedicated PA and lateral chest radiograph. Electronically Signed   By: Meda Klinefelter MD   On: 01/02/2021 18:17   VAS [place] LOWER EXTREMITY VENOUS (DVT) ([facility]  and [facility] 7a-7p)  Result Date: 01/02/2021  Lower Venous DVT Study Indications: Edema. Other Indications: Severe bed bugs. Limitations: Body habitus and poor ultrasound/tissue interface. Comparison Study: LLE 4/20 Negative Performing Technologist: Clint Guy RVT  Examination Guidelines: A complete evaluation includes B-mode imaging, spectral Doppler, color Doppler, and power Doppler as needed of all accessible portions of each vessel. Bilateral testing is considered an integral part of a complete examination. Limited examinations for reoccurring indications may be performed as noted. The reflux portion of the exam is performed with the patient in reverse Trendelenburg.  +---------+---------------+---------+-----------+----------+--------------+ LEFT     CompressibilityPhasicitySpontaneityPropertiesThrombus Aging +---------+---------------+---------+-----------+----------+--------------+ CFV      Full           Yes      Yes                                 +---------+---------------+---------+-----------+----------+--------------+  SFJ      Full                                                        +---------+---------------+---------+-----------+----------+--------------+ FV Prox  Full                                                        +---------+---------------+---------+-----------+----------+--------------+ FV Mid   Full                                                        +---------+---------------+---------+-----------+----------+--------------+ FV DistalFull                                                        +---------+---------------+---------+-----------+----------+--------------+ PFV      Full                                                        +---------+---------------+---------+-----------+----------+--------------+ POP      Full           Yes      Yes                                  +---------+---------------+---------+-----------+----------+--------------+  Summary: LEFT: - There is no evidence of deep vein thrombosis in the lower extremity. However, portions of this examination were limited- see technologist comments above.  - No cystic structure found in the popliteal fossa.  *See table(s) above for measurements and observations.    Preliminary     Reece Leader, DO 01/02/2021, 7:05 PM PGY-1, [facility] Family Medicine FPTS Intern pager: [phone], text pages welcome  FPTS Upper-Level Resident Addendum   I have independently interviewed and examined the patient. I have discussed the above with the original author and agree with their documentation. My edits for correction/addition/clarification are within the document. Please see also any attending notes.   Katha Cabal, DO PGY-2, [place] [facility] 01/03/2021 3:44 AM  FPTS Service pager: [phone] (text pages welcome through [facility])

## 2021-01-02 NOTE — ED Notes (Signed)
IV also unable to get IV access on this pt. EDP notifed

## 2021-01-02 NOTE — Progress Notes (Signed)
Left lower extremity venous study completed.   Dr. Anitra Lauth present for test.  Limited LLE and Right Upper to be cancelled.    Please see CV Proc for preliminary results.   Clint Guy, RVT

## 2021-01-02 NOTE — ED Notes (Signed)
Patient decontaminated and moved from room 4 to 8, all belonging double bagged

## 2021-01-02 NOTE — ED Triage Notes (Signed)
Pt found by sil today. Unsure how long pt has been down. Caregiver not present since Friday. A&O , but sluggish with responses and commands. HX seizures. All extremities edematous and dry with legs stuck to pants. Brady 43-50, bp 134/77, cbg 130, 96% RA. 12 L artifact

## 2021-01-03 ENCOUNTER — Inpatient Hospital Stay (HOSPITAL_COMMUNITY): Payer: Medicare Other

## 2021-01-03 ENCOUNTER — Encounter (HOSPITAL_COMMUNITY): Payer: Medicare Other

## 2021-01-03 DIAGNOSIS — R531 Weakness: Secondary | ICD-10-CM | POA: Diagnosis not present

## 2021-01-03 DIAGNOSIS — D649 Anemia, unspecified: Secondary | ICD-10-CM | POA: Diagnosis not present

## 2021-01-03 DIAGNOSIS — R0602 Shortness of breath: Secondary | ICD-10-CM | POA: Insufficient documentation

## 2021-01-03 DIAGNOSIS — R4182 Altered mental status, unspecified: Secondary | ICD-10-CM | POA: Diagnosis not present

## 2021-01-03 DIAGNOSIS — J189 Pneumonia, unspecified organism: Secondary | ICD-10-CM | POA: Insufficient documentation

## 2021-01-03 DIAGNOSIS — E870 Hyperosmolality and hypernatremia: Secondary | ICD-10-CM | POA: Insufficient documentation

## 2021-01-03 DIAGNOSIS — D696 Thrombocytopenia, unspecified: Secondary | ICD-10-CM | POA: Diagnosis present

## 2021-01-03 LAB — RAPID URINE DRUG SCREEN, HOSP PERFORMED
Amphetamines: NOT DETECTED
Barbiturates: NOT DETECTED
Benzodiazepines: NOT DETECTED
Cocaine: NOT DETECTED
Opiates: NOT DETECTED
Tetrahydrocannabinol: NOT DETECTED

## 2021-01-03 LAB — CBC WITH DIFFERENTIAL/PLATELET
Abs Immature Granulocytes: 0.02 10*3/uL (ref 0.00–0.07)
Abs Immature Granulocytes: 0.04 10*3/uL (ref 0.00–0.07)
Basophils Absolute: 0 10*3/uL (ref 0.0–0.1)
Basophils Absolute: 0 10*3/uL (ref 0.0–0.1)
Basophils Relative: 0 %
Basophils Relative: 0 %
Eosinophils Absolute: 0.6 10*3/uL — ABNORMAL HIGH (ref 0.0–0.5)
Eosinophils Absolute: 0.4 10*3/uL (ref 0.0–0.5)
Eosinophils Relative: 7 %
Eosinophils Relative: 4 %
HCT: 19.7 % — ABNORMAL LOW (ref 39.0–52.0)
HCT: 22.5 % — ABNORMAL LOW (ref 39.0–52.0)
Hemoglobin: 6.1 g/dL — CL (ref 13.0–17.0)
Hemoglobin: 7 g/dL — ABNORMAL LOW (ref 13.0–17.0)
Immature Granulocytes: 0 %
Immature Granulocytes: 0 %
Lymphocytes Relative: 14 %
Lymphocytes Relative: 13 %
Lymphs Abs: 1.1 10*3/uL (ref 0.7–4.0)
Lymphs Abs: 1.4 10*3/uL (ref 0.7–4.0)
MCH: 26 pg (ref 26.0–34.0)
MCH: 25.8 pg — ABNORMAL LOW (ref 26.0–34.0)
MCHC: 31 g/dL (ref 30.0–36.0)
MCHC: 31.1 g/dL (ref 30.0–36.0)
MCV: 83.8 fL (ref 80.0–100.0)
MCV: 83 fL (ref 80.0–100.0)
Monocytes Absolute: 0.3 10*3/uL (ref 0.1–1.0)
Monocytes Absolute: 0.4 10*3/uL (ref 0.1–1.0)
Monocytes Relative: 3 %
Monocytes Relative: 4 %
Neutro Abs: 6 10*3/uL (ref 1.7–7.7)
Neutro Abs: 8.5 10*3/uL — ABNORMAL HIGH (ref 1.7–7.7)
Neutrophils Relative %: 76 %
Neutrophils Relative %: 79 %
Platelets: 69 10*3/uL — ABNORMAL LOW (ref 150–400)
Platelets: 79 10*3/uL — ABNORMAL LOW (ref 150–400)
RBC: 2.35 MIL/uL — ABNORMAL LOW (ref 4.22–5.81)
RBC: 2.71 MIL/uL — ABNORMAL LOW (ref 4.22–5.81)
RDW: 23.5 % — ABNORMAL HIGH (ref 11.5–15.5)
RDW: 22.3 % — ABNORMAL HIGH (ref 11.5–15.5)
WBC: 8 10*3/uL (ref 4.0–10.5)
WBC: 10.8 10*3/uL — ABNORMAL HIGH (ref 4.0–10.5)
nRBC: 0.6 % — ABNORMAL HIGH (ref 0.0–0.2)
nRBC: 0.6 % — ABNORMAL HIGH (ref 0.0–0.2)

## 2021-01-03 LAB — COMPREHENSIVE METABOLIC PANEL
ALT: 24 U/L (ref 0–44)
AST: 40 U/L (ref 15–41)
Albumin: 1.8 g/dL — ABNORMAL LOW (ref 3.5–5.0)
Alkaline Phosphatase: 63 U/L (ref 38–126)
Anion gap: 11 (ref 5–15)
BUN: 23 mg/dL (ref 8–23)
CO2: 22 mmol/L (ref 22–32)
Calcium: 8.4 mg/dL — ABNORMAL LOW (ref 8.9–10.3)
Chloride: 118 mmol/L — ABNORMAL HIGH (ref 98–111)
Creatinine, Ser: 1.01 mg/dL (ref 0.61–1.24)
GFR, Estimated: >60 mL/min (ref >60)
Glucose, Bld: 88 mg/dL (ref 70–99)
Potassium: 2.9 mmol/L — ABNORMAL LOW (ref 3.5–5.1)
Sodium: 151 mmol/L — ABNORMAL HIGH (ref 135–145)
Total Bilirubin: 0.6 mg/dL (ref 0.3–1.2)
Total Protein: 5.7 g/dL — ABNORMAL LOW (ref 6.5–8.1)

## 2021-01-03 LAB — TROPONIN I (HIGH SENSITIVITY)
Troponin I (High Sensitivity): 13 ng/L (ref <18)
Troponin I (High Sensitivity): 17 ng/L (ref <18)

## 2021-01-03 LAB — FOLATE: Folate: 6.4 ng/mL (ref >5.9)

## 2021-01-03 LAB — CBC
HCT: 22.9 % — ABNORMAL LOW (ref 39.0–52.0)
Hemoglobin: 6.6 g/dL — CL (ref 13.0–17.0)
MCH: 24.6 pg — ABNORMAL LOW (ref 26.0–34.0)
MCHC: 28.8 g/dL — ABNORMAL LOW (ref 30.0–36.0)
MCV: 85.4 fL (ref 80.0–100.0)
Platelets: 74 10*3/uL — ABNORMAL LOW (ref 150–400)
RBC: 2.68 MIL/uL — ABNORMAL LOW (ref 4.22–5.81)
RDW: 23 % — ABNORMAL HIGH (ref 11.5–15.5)
WBC: 10.3 10*3/uL (ref 4.0–10.5)
nRBC: 0.4 % — ABNORMAL HIGH (ref 0.0–0.2)

## 2021-01-03 LAB — BASIC METABOLIC PANEL
Anion gap: 13 (ref 5–15)
Anion gap: 10 (ref 5–15)
Anion gap: 7 (ref 5–15)
Anion gap: 9 (ref 5–15)
BUN: 25 mg/dL — ABNORMAL HIGH (ref 8–23)
BUN: 22 mg/dL (ref 8–23)
BUN: 20 mg/dL (ref 8–23)
BUN: 17 mg/dL (ref 8–23)
CO2: 18 mmol/L — ABNORMAL LOW (ref 22–32)
CO2: 22 mmol/L (ref 22–32)
CO2: 26 mmol/L (ref 22–32)
CO2: 22 mmol/L (ref 22–32)
Calcium: 8.3 mg/dL — ABNORMAL LOW (ref 8.9–10.3)
Calcium: 8.1 mg/dL — ABNORMAL LOW (ref 8.9–10.3)
Calcium: 8.1 mg/dL — ABNORMAL LOW (ref 8.9–10.3)
Calcium: 7.6 mg/dL — ABNORMAL LOW (ref 8.9–10.3)
Chloride: 120 mmol/L — ABNORMAL HIGH (ref 98–111)
Chloride: 119 mmol/L — ABNORMAL HIGH (ref 98–111)
Chloride: 119 mmol/L — ABNORMAL HIGH (ref 98–111)
Chloride: 118 mmol/L — ABNORMAL HIGH (ref 98–111)
Creatinine, Ser: 1.04 mg/dL (ref 0.61–1.24)
Creatinine, Ser: 1.04 mg/dL (ref 0.61–1.24)
Creatinine, Ser: 1 mg/dL (ref 0.61–1.24)
Creatinine, Ser: 0.97 mg/dL (ref 0.61–1.24)
GFR, Estimated: >60 mL/min (ref >60)
GFR, Estimated: >60 mL/min (ref >60)
GFR, Estimated: >60 mL/min (ref >60)
GFR, Estimated: >60 mL/min (ref >60)
Glucose, Bld: 92 mg/dL (ref 70–99)
Glucose, Bld: 61 mg/dL — ABNORMAL LOW (ref 70–99)
Glucose, Bld: 63 mg/dL — ABNORMAL LOW (ref 70–99)
Glucose, Bld: 82 mg/dL (ref 70–99)
Potassium: 3.4 mmol/L — ABNORMAL LOW (ref 3.5–5.1)
Potassium: 2.9 mmol/L — ABNORMAL LOW (ref 3.5–5.1)
Potassium: 3.1 mmol/L — ABNORMAL LOW (ref 3.5–5.1)
Potassium: 3.5 mmol/L (ref 3.5–5.1)
Sodium: 151 mmol/L — ABNORMAL HIGH (ref 135–145)
Sodium: 151 mmol/L — ABNORMAL HIGH (ref 135–145)
Sodium: 152 mmol/L — ABNORMAL HIGH (ref 135–145)
Sodium: 149 mmol/L — ABNORMAL HIGH (ref 135–145)

## 2021-01-03 LAB — LACTATE DEHYDROGENASE: LDH: 142 U/L (ref 98–192)

## 2021-01-03 LAB — IRON AND TIBC
Iron: 66 ug/dL (ref 45–182)
Saturation Ratios: 31 % (ref 17.9–39.5)
TIBC: 213 ug/dL — ABNORMAL LOW (ref 250–450)
UIBC: 147 ug/dL

## 2021-01-03 LAB — FERRITIN: Ferritin: 36 ng/mL (ref 24–336)

## 2021-01-03 LAB — TSH: TSH: 4.309 u[IU]/mL (ref 0.350–4.500)

## 2021-01-03 LAB — RETICULOCYTES
Immature Retic Fract: 21.1 % — ABNORMAL HIGH (ref 2.3–15.9)
RBC.: 2.66 MIL/uL — ABNORMAL LOW (ref 4.22–5.81)
Retic Count, Absolute: 84.9 10*3/uL (ref 19.0–186.0)
Retic Ct Pct: 3.2 % — ABNORMAL HIGH (ref 0.4–3.1)

## 2021-01-03 LAB — PHOSPHORUS: Phosphorus: 3.1 mg/dL (ref 2.5–4.6)

## 2021-01-03 LAB — APTT: aPTT: 44 seconds — ABNORMAL HIGH (ref 24–36)

## 2021-01-03 LAB — PROTIME-INR
INR: 1.3 — ABNORMAL HIGH (ref 0.8–1.2)
Prothrombin Time: 15.3 seconds — ABNORMAL HIGH (ref 11.4–15.2)

## 2021-01-03 LAB — VITAMIN B12: Vitamin B-12: 1493 pg/mL — ABNORMAL HIGH (ref 180–914)

## 2021-01-03 LAB — PREPARE RBC (CROSSMATCH)

## 2021-01-03 LAB — POC OCCULT BLOOD, ED: Fecal Occult Bld: POSITIVE — AB

## 2021-01-03 LAB — CK: Total CK: 293 U/L (ref 49–397)

## 2021-01-03 LAB — MAGNESIUM: Magnesium: 2.3 mg/dL (ref 1.7–2.4)

## 2021-01-03 LAB — DIRECT ANTIGLOBULIN TEST (NOT AT ARMC)
DAT, IgG: NEGATIVE
DAT, complement: NEGATIVE

## 2021-01-03 LAB — HIV ANTIBODY (ROUTINE TESTING W REFLEX): HIV Screen 4th Generation wRfx: NONREACTIVE

## 2021-01-03 MED ORDER — POTASSIUM CHLORIDE 2 MEQ/ML IV SOLN
INTRAVENOUS | Status: DC
  Filled 2021-01-03 (×4): qty 1000

## 2021-01-03 MED ORDER — POTASSIUM CHLORIDE 10 MEQ/100ML IV SOLN
10.0000 meq | INTRAVENOUS | Status: AC
  Administered 2021-01-03 (×4): 10 meq via INTRAVENOUS
  Filled 2021-01-03 (×2): qty 100

## 2021-01-03 MED ORDER — IOHEXOL 350 MG/ML SOLN
100.0000 mL | Freq: Once | INTRAVENOUS | Status: AC | PRN
  Administered 2021-01-03: 100 mL via INTRAVENOUS

## 2021-01-03 MED ORDER — SODIUM CHLORIDE 0.9% IV SOLUTION: Freq: Once | INTRAVENOUS | Status: AC

## 2021-01-03 MED ORDER — SODIUM CHLORIDE 0.9% IV SOLUTION: Freq: Once | INTRAVENOUS | Status: DC

## 2021-01-03 MED ORDER — SODIUM CHLORIDE 0.9 % IV SOLN
3.0000 g | Freq: Four times a day (QID) | INTRAVENOUS | Status: DC
  Administered 2021-01-03 – 2021-01-04 (×4): 3 g via INTRAVENOUS
  Filled 2021-01-03 (×6): qty 8

## 2021-01-03 MED ORDER — LEVETIRACETAM IN NACL 1000 MG/100ML IV SOLN
1000.0000 mg | Freq: Two times a day (BID) | INTRAVENOUS | Status: DC
  Administered 2021-01-04 – 2021-01-06 (×5): 1000 mg via INTRAVENOUS
  Filled 2021-01-03 (×6): qty 100

## 2021-01-03 MED ORDER — LACTATED RINGERS IV BOLUS
1000.0000 mL | Freq: Once | INTRAVENOUS | Status: AC
  Administered 2021-01-03: 1000 mL via INTRAVENOUS

## 2021-01-03 MED ORDER — SODIUM CHLORIDE 0.9 % IV SOLN
3.0000 g | Freq: Three times a day (TID) | INTRAVENOUS | Status: DC
  Filled 2021-01-03 (×2): qty 8

## 2021-01-03 MED ORDER — LEVETIRACETAM IN NACL 1000 MG/100ML IV SOLN
1000.0000 mg | Freq: Once | INTRAVENOUS | Status: AC
  Administered 2021-01-03: 1000 mg via INTRAVENOUS
  Filled 2021-01-03: qty 100

## 2021-01-03 MED ORDER — LACTATED RINGERS IV BOLUS: 2000.0000 mL | Freq: Once | INTRAVENOUS | Status: DC

## 2021-01-03 MED ORDER — POTASSIUM CHLORIDE 10 MEQ/100ML IV SOLN
10.0000 meq | INTRAVENOUS | Status: AC
  Filled 2021-01-03 (×2): qty 100

## 2021-01-03 NOTE — ED Notes (Signed)
This RN spoke to both [facility] and [facility] medicine provider [place] about patient lungs sounding wet/potential fluid overload. Family medicine Provider states they will be coming to see the patient. This RN will continue to monitor the patient at this time.

## 2021-01-03 NOTE — Progress Notes (Signed)
Pt is in bed hypothermic, hypotensive and anemic.  Follow up per nsg at another time when pt is more medically stable for the visit.  01/03/21 1100  PT Visit Information  Reason Eval/Treat Not Completed Other (comment)    Samul Dada, PT MS Acute Rehab Dept. Number: [facility] [id number] and [facility] [phone]

## 2021-01-03 NOTE — Progress Notes (Addendum)
Left message with ED secretary to have RN page [place] regarding his head CT. Head CT order placed around 4pm. ED RN calls to report that the patient has not gotten CT Head because he needs to be decontaminated prior to going to CT otherwise they would have to shut the room down.  I explained to her that this patient was found down and had evidence of trauma on right forehead and on the top of his head. Pt needs to get to CT. She states she will get staff together to decontaminate patient and get him to CT.   Update: Called ED again to inquire why pt has not yet been to CT. States he has been decontaminated but has to wait for CT because there has been several CVA's.   Katha Cabal, DO PGY-2, [place] [facility] 01/03/2021 12:38 AM

## 2021-01-03 NOTE — ED Notes (Signed)
Son terry called and would like social worker case manger to get involve with placement please call  [phone] due to house environment

## 2021-01-03 NOTE — Progress Notes (Signed)
NAME:  Mason Hart, MRN:  [id number], DOB:  25-Jan-1956, LOS: 1 ADMISSION DATE:  01/02/2021, CONSULTATION DATE:  01/03/21 REFERRING MD:  Rachael Darby, CHIEF COMPLAINT: weakness  Brief History:  65 yoM w/hx of ETOH abuse, HTN, hypothyroidism, and seizure disorder presenting from home with generalized weakness and AMS found down for unknown amount of time admitted to FPTS with altered hypotension, mental status, sepsis suspicious for pneumonia treating with CAP coverage, rhabdomyolysis, and acute on chronic anemia.  Since admit, s/p 3 units of PRBC with Hgb 5.9->7 without obvious source of blood loss and some improvement in blood pressure but remains marginal.  CTA abd/ pelvis pending.  PCCM asked to re-evaluate.    History of Present Illness:  65 yo man with hx seizures, etoh abuse, found down, initially bradycardic, encephalopathic, weakness, unable to ambulate, recently worsening living conditions (insects- bedbugs/ roaches, not maintained), etoh use, unable to ambulate.   Noted to have swelling in R arm and L LE, chronic (?).   +cough.  IN ED started on tx for PNA, 2 u prbc transfusion for anemia.  Started fluids 1/2 ns at 18ml/hr.  lr bolus charted ICU called due to hypothermia, persistent anemia despite 1 unit PRBC.   Past Medical History:  ETOH use HTN Hypothyroidism RBBB Seizure hx. ? Non compliance with keppra  Anemia  Significant Hospital Events:  2/6 admit FPTS  Consults:  PCCM   Procedures:    Significant Diagnostic Tests:  2/7 CTH >> 1. No acute finding by CT. Age related atrophy and chronic small-vessel ischemic changes of the white matter. 2. Chronic inflammatory changes of the paranasal sinuses, more pronounced on the left than the right. Complete opacification of the sphenoid sinus left division.  2/6 [place] LLE >> limited exam, no evidence of DVT  2/7 CTA abd/ pelvis >>  Micro Data:  2/6 SARS >>negative  2/7 BCx2 >> 2/7 UC >>  Antimicrobials:  2/6  Ceftriaxone 2/6 azithromycin Unasyn 2/7  Interim History / Subjective:   Pt remains in room air, MAP 68  Objective   Blood pressure 95/61, pulse 65, temperature (!) 94.7 F (34.8 C), resp. rate 16, height 6\' 1"  (1.854 m), weight 91.9 kg, SpO2 98 %.        Intake/Output Summary (Last 24 hours) at 01/03/2021 1634 Last data filed at 01/03/2021 1408 Gross per 24 hour  Intake 930 ml  Output -  Net 930 ml   Filed Weights   01/02/21 1602  Weight: 91.9 kg   Examination:   General:  Chronically ill-appearing M, awake, sleepy HEENT: MM pink/moist, thick oral secretions Neuro: arousable, answers questions and then quickly falls back asleep, no obvious focal deficits CV: s1s2 rrr, no m/r/g PULM:  Thick upper airway sounds throughout, on RA, no respiratory distress  GI: soft, bsx4 active  Extremities: warm/dry, no edema  Skin: thickened flaking dermis on all extremities, 3+ LLE 1+ RLE  Labs reviewed: Hgb  , WBC 8->10.8, Na 152, K 3.1, Cl 119, glucose 63, BUN 20, sCr 1, CK 824-> 293, trop hs trend flat 13-> 17, lactic 1.8, TSH 4.309, HIV neg, FOBT +, UDS neg CXR 2/7 c/w bilateral pulmonary infiltrates   No UA thus far  Resolved Hospital Problem list     Assessment & Plan:   Hypotension, multifactorial due to acute on chronic anemia, possible sepsis, though lactic acid WNL and has no leukocytosis  TSH WNL P: - currently MAP at goal, Suspect that he is still significantly volume down, additional 2L  LR -Change antibiotics to Unasyn for broader coverage  - MAP goal >65 - follow cultures - need UA/ UC -No current indication for ICU transfer, please contact PCCM for worsening clinical status or concerns  Encephalopathy  Suspect this is secondary to ETOH abuse and hypothermia, CTH without acute findings -responsive on exam P: -Monitor mental status, continue bair hugger -possibly post-ictal, but do not suspect continued sz activity   Normocytic Anemia FOBT+ - s/p 3 units of  PRBC with Hgb 5.9-> 7 P: -chronic schistocytes and thrombocytopenia, likely secondary to chronic myelosuppression -anemia work-up sent by primary team - trend H/H      Hypernatremia  Hypokalemia P: - mag 2.3 - K repleted -IVF    Rhabdomyolysis - resolving, CK 824-> 293 P: - supportive care, IVF, monitor renal function    Hx seizures Loaded with Keppra in the ED P: - seizure precautions  - keppra for AED   ETOH abuse - CIWA - thiamine/ folate   Labs   CBC: Recent Labs  Lab 01/02/21 1728 01/03/21 0224 01/03/21 0800  WBC 12.4* 8.0 10.8*  NEUTROABS 10.9* 6.0 8.5*  HGB 5.9* 6.1* 7.0*  HCT 20.9* 19.7* 22.5*  MCV 84.6 83.8 83.0  PLT 75* 69* 79*    Basic Metabolic Panel: Recent Labs  Lab 01/02/21 1728 01/03/21 0153 01/03/21 0337 01/03/21 0800 01/03/21 1233  NA 150* 151* 151* 151* 152*  K 3.7 3.4* 2.9* 2.9* 3.1*  CL 118* 120* 118* 119* 119*  CO2 22 18* 22 22 26   GLUCOSE 115* 92 88 61* 63*  BUN 27* 25* 23 22 20   CREATININE 1.16 1.04 1.01 1.04 1.00  CALCIUM 8.4* 8.3* 8.4* 8.1* 8.1*  MG  --   --  2.3  --   --   PHOS  --   --  3.1  --   --    GFR: Estimated Creatinine Clearance: 82.1 mL/min (by C-G formula based on SCr of 1 mg/dL). Recent Labs  Lab 01/02/21 1728 01/02/21 1729 01/03/21 0224 01/03/21 0800  WBC 12.4*  --  8.0 10.8*  LATICACIDVEN  --  1.8  --   --     Liver Function Tests: Recent Labs  Lab 01/02/21 1728 01/03/21 0337  AST 59* 40  ALT 28 24  ALKPHOS 59 63  BILITOT 0.7 0.6  PROT 6.1* 5.7*  ALBUMIN 2.0* 1.8*   No results for input(s): LIPASE, AMYLASE in the last 168 hours. Recent Labs  Lab 01/02/21 1729  AMMONIA 27    ABG    Component Value Date/Time   PHART 7.438 12/05/2018 0349   PCO2ART 30.1 (L) 12/05/2018 0349   PO2ART 65.2 (L) 12/05/2018 0349   HCO3 20.5 12/05/2018 0349   TCO2 14 (L) 10/20/2020 2125   ACIDBASEDEF 3.4 (H) 12/05/2018 0349   O2SAT 93.5 12/05/2018 0349     Coagulation Profile: Recent Labs   Lab 01/03/21 1233  INR 1.3*    Cardiac Enzymes: Recent Labs  Lab 01/02/21 1728 01/03/21 1233  CKTOTAL 824* 293    HbA1C: Hgb A1c MFr Bld  Date/Time Value Ref Range Status  04/13/2020 03:40 PM 4.8 4.8 - 5.6 % Final    Comment:             Prediabetes: 5.7 - 6.4          Diabetes: >6.4          Glycemic control for adults with diabetes: <7.0   12/09/2012 03:06 PM 4.5 (L) 4.6 - 6.5 % Final  Comment:    Glycemic Control Guidelines for People with Diabetes:Non Diabetic:  <6%Goal of Therapy: <7%Additional Action Suggested:  >8%     CBG: Recent Labs  Lab 01/02/21 1607  GLUCAP 105*    CRITICAL CARE Performed by: Darcella Gasman Hancel Ion   Total critical care time: 50 minutes  Critical care time was exclusive of separately billable procedures and treating other patients.  Critical care was necessary to treat or prevent imminent or life-threatening deterioration.  Critical care was time spent personally by me on the following activities: development of treatment plan with patient and/or surrogate as well as nursing, discussions with consultants, evaluation of patient's response to treatment, examination of patient, obtaining history from patient or surrogate, ordering and performing treatments and interventions, ordering and review of laboratory studies, ordering and review of radiographic studies, pulse oximetry and re-evaluation of patient's condition.  Darcella Gasman Sheralyn Pinegar, PA-C [facility] See Amion for pager If no response to pager , please call [phone] until 7pm After 7:00 pm call Elink  [phone]

## 2021-01-03 NOTE — ED Notes (Signed)
Patient is getting united of blood at this time 

## 2021-01-03 NOTE — ED Notes (Signed)
MD updated on decreased BP readings

## 2021-01-03 NOTE — Progress Notes (Signed)
Spoke with Dr. Jayme Cloud, CCM, regarding pt who is acutely hypothermic and anemic (Hgb 6.1) on Biar hugger. She will evaluate and make recommendations.   Katha Cabal, DO PGY-2, [facility] 01/03/2021 5:42 AM

## 2021-01-03 NOTE — Consult Note (Signed)
NAME:  Mason Hart, MRN:  [id number], DOB:  22-Feb-1956, LOS: 1 ADMISSION DATE:  01/02/2021, CONSULTATION DATE:  01/03/21 REFERRING MD:  Rachael Darby, CHIEF COMPLAINT: weakness  Brief History:  Found down by family, unk duration of time.   History of Present Illness:  65 yo man with hx seizures, etoh abuse, found down, initially bradycardic, encephalopathic, weakness, unable to ambulate, recently worsening living conditions (insects, not maintained), etoh use, unable to ambulate.    Noted to have swelling in R arm and L LE, chronic (?).   +cough.   IN ED started on tx for PNA, 2 u prbc transfusion for anemia.  Started fluids 1/2 ns at 122ml/hr.  lr bolus charted  ICU called due to hypothermia, persistent anemia despite 1 unit PRBC.  Past Medical History:  ETOH use HTN Hypothyroidism RBBB Seizure hx.  Anemia  Keppra, iron.  Significant Hospital Events:    Consults:  General surgery   Procedures:    Significant Diagnostic Tests:  Cxr: IMPRESSION: Increasing asymmetric and vague bilateral pulmonary opacities suggestive of bilateral pneumonia, greater on the left. Aspiration also a consideration in this setting. Asymmetric pulmonary edema not favored at this time.   Micro Data:    Antimicrobials:  Ceftriaxone  azithromycin  Interim History / Subjective:    Objective   Blood pressure 103/75, pulse (!) 47, temperature (!) 87.7 F (30.9 C), temperature source Rectal, resp. rate 16, height 6\' 1"  (1.854 m), weight 91.9 kg, SpO2 100 %.        Intake/Output Summary (Last 24 hours) at 01/03/2021 0545 Last data filed at 01/02/2021 2300 Gross per 24 hour  Intake 300 ml  Output --  Net 300 ml   Filed Weights   01/02/21 1602  Weight: 91.9 kg    Examination: General: awake  pleasant HENT: Perrl, slight swelling around eyelids  Lungs: ctab Cardiovascular: RRR  Abdomen: nt, nd, nbs Extremities: edema in RUE and LLE, non pitting.  Neuro: non focal  Resolved Hospital  Problem list     Assessment & Plan:  Weakness/lethargy: Hypothermia Possible PNA Anemia FOBT+ Hypotension - appears to be perfusing well. Awake, alert.  Still appears dry, would tolerate additional fluids.  Possible sepsis 2/2 pna.   Anemia: acute on chronic: received 1 u of blood from 5.9 --> 6.1.  Also received 1 L LR so lack of appropriate correction could be dilutional effect.  Likely multifactorial, Anemia of chronic disease, etoh related.  Retics low.   Hypoalbuminemia  Will order additional bolus of LR 1 L now.   Completing PRBC transfusion now.   Appears to be perfusing well.  Appreciate consult. Will sign off now. Please call 03/02/21 again if needed.    Labs   CBC: Recent Labs  Lab 01/02/21 1728 01/03/21 0224  WBC 12.4* 8.0  NEUTROABS 10.9* 6.0  HGB 5.9* 6.1*  HCT 20.9* 19.7*  MCV 84.6 83.8  PLT 75* 69*    Basic Metabolic Panel: Recent Labs  Lab 01/02/21 1728 01/03/21 0153 01/03/21 0337  NA 150* 151* 151*  K 3.7 3.4* 2.9*  CL 118* 120* 118*  CO2 22 18* 22  GLUCOSE 115* 92 88  BUN 27* 25* 23  CREATININE 1.16 1.04 1.01  CALCIUM 8.4* 8.3* 8.4*  MG  --   --  2.3  PHOS  --   --  3.1   GFR: Estimated Creatinine Clearance: 81.3 mL/min (by C-G formula based on SCr of 1.01 mg/dL). Recent Labs  Lab 01/02/21 1728 01/02/21 1729 01/03/21  0224  WBC 12.4*  --  8.0  LATICACIDVEN  --  1.8  --     Liver Function Tests: Recent Labs  Lab 01/02/21 1728 01/03/21 0337  AST 59* 40  ALT 28 24  ALKPHOS 59 63  BILITOT 0.7 0.6  PROT 6.1* 5.7*  ALBUMIN 2.0* 1.8*   No results for input(s): LIPASE, AMYLASE in the last 168 hours. Recent Labs  Lab 01/02/21 1729  AMMONIA 27    ABG    Component Value Date/Time   PHART 7.438 12/05/2018 0349   PCO2ART 30.1 (L) 12/05/2018 0349   PO2ART 65.2 (L) 12/05/2018 0349   HCO3 20.5 12/05/2018 0349   TCO2 14 (L) 10/20/2020 2125   ACIDBASEDEF 3.4 (H) 12/05/2018 0349   O2SAT 93.5 12/05/2018 0349     Coagulation  Profile: No results for input(s): INR, PROTIME in the last 168 hours.  Cardiac Enzymes: Recent Labs  Lab 01/02/21 1728  CKTOTAL 824*    HbA1C: Hgb A1c MFr Bld  Date/Time Value Ref Range Status  04/13/2020 03:40 PM 4.8 4.8 - 5.6 % Final    Comment:             Prediabetes: 5.7 - 6.4          Diabetes: >6.4          Glycemic control for adults with diabetes: <7.0   12/09/2012 03:06 PM 4.5 (L) 4.6 - 6.5 % Final    Comment:    Glycemic Control Guidelines for People with Diabetes:Non Diabetic:  <6%Goal of Therapy: <7%Additional Action Suggested:  >8%     CBG: Recent Labs  Lab 01/02/21 1607  GLUCAP 105*    Review of Systems:     Past Medical History:  He,  has a past medical history of Alcohol abuse, Hypertension, Hypothyroidism, Noncompliance with medication regimen, RBBB (right bundle branch block), and Seizures (HCC).   Surgical History:   Past Surgical History:  Procedure Laterality Date  . NO PAST SURGERIES       Social History:   reports that he has never smoked. He has never used smokeless tobacco. He reports current alcohol use. He reports that he does not use drugs.   Family History:  His family history includes Heart disease in his mother; Other in his father. There is no history of Cancer, Diabetes, or Stroke.   Allergies No Known Allergies   Home Medications  Prior to Admission medications   Medication Sig Start Date End Date Taking? Authorizing Provider  ferrous sulfate 325 (65 FE) MG tablet Take 1 tablet (325 mg total) by mouth daily. 10/24/20 10/24/21 Yes Dorcas Carrow, MD  levETIRAcetam (KEPPRA) 1000 MG tablet Take 1 tablet (1,000 mg total) by mouth 2 (two) times daily. Last fill! Must keep appointment. 04/29/20 07/23/21 Yes Levert Feinstein, MD     Critical care time: 45 min

## 2021-01-03 NOTE — ED Notes (Signed)
Per RN, RN will attempt to pull off line for labs

## 2021-01-03 NOTE — Progress Notes (Signed)
eLink Physician-Brief Progress Note Patient Name: Mason Hart DOB: 1956-08-27 MRN: [id number]   Date of Service  01/03/2021  HPI/Events of Note  Nursing concerned that patient sounds wet. Now on 2 L/min [address] O2 with sat = 94% and RR = 27. BP = 102/62 with MAP = 73. Portable CXR at 4:30 PM revealed bilateral pulmonary infiltrates.   eICU Interventions  Plan: 1. Decrease D5 0.9 NaCl + KCl IV infusion to 75 mL/hour.  2. Portable CXR STAT.      Intervention Category Major Interventions: Hypoxemia - evaluation and management  Colbi Schiltz Dennard Nip 01/03/2021, 11:48 PM

## 2021-01-03 NOTE — Progress Notes (Addendum)
FPTS Interim Progress Note  S: Received page regarding temperature drop to 87.7 F, went to go assess patient along with Dr. Rachael Darby. Patient placed in bair hugger upon our arrival. Nurse reports that patient recently was bathed and noted that he is currently receiving 2 units pRBC.   O: BP 103/75   Pulse (!) 47   Temp (!) 87.7 F (30.9 C) (Rectal) Comment (Src): rectal  Resp 16   Ht 6\' 1"  (1.854 m)   Wt 91.9 kg   SpO2 100%   BMI 26.73 kg/m   General: Patient laying comfortably in bed, in no acute distress. Resp: breathing comfortably without respiratory distress Neuro: AOx2, easily arousable to voice, answering questions appropriately    A/P: -Dr. contacted CCM for further evaluation and recommendations -concern for possible sepsis and cardiogenic shock   -repeat CXR -CT abdomen/pelvis -Hemoccult noted to be positive, will likely consult GI in the morning  -obtain blood and urine cultures   Rachael Darby, DO 01/03/2021, 5:35 AM PGY-1, [facility] Service pager [phone]

## 2021-01-03 NOTE — Progress Notes (Addendum)
Pharmacy Antibiotic Note  Mason Hart is a 65 y.o. male admitted on 01/02/2021 with aspiration pneumonia. Pharmacy has been consulted for Unasyn dosing.  CXR with bilateral opacities; WBC 10.8; Tm 94.9  Plan: Unasyn 3g IV q6hours  Monitor clinical improvement, LOT   Height: 6\' 1"  (185.4 cm) Weight: 91.9 kg (202 lb 9.6 oz) IBW/kg (Calculated) : 79.9  Temp (24hrs), Avg:93.9 F (34.4 C), Min:87.7 F (30.9 C), Max:98.9 F (37.2 C)  Recent Labs  Lab 01/02/21 1728 01/02/21 1729 01/03/21 0153 01/03/21 0224 01/03/21 0337 01/03/21 0800 01/03/21 1233  WBC 12.4*  --   --  8.0  --  10.8*  --   CREATININE 1.16  --  1.04  --  1.01 1.04 1.00  LATICACIDVEN  --  1.8  --   --   --   --   --     Estimated Creatinine Clearance: 82.1 mL/min (by C-G formula based on SCr of 1 mg/dL).    No Known Allergies  Antimicrobials this admission: Ceftriaxone 2/6 >> 2/7 Azithromycin 2/6 >> 2/7  Microbiology results: 2/7 BCx: sent 2/7 UCx: sent   Thank you for allowing pharmacy to be a part of this patient's care.  4/7, PharmD PGY-1 Acute Care Pharmacy Resident Office: [phone] 01/03/2021 5:07 PM

## 2021-01-03 NOTE — ED Notes (Signed)
This RN just took over care for this patient. This RN spoke to provider about whether or not to give the Unit of blood that hasn't been released before taking on care of this patient.  Provider states to hold off until repeat H&H has resulted.

## 2021-01-03 NOTE — H&P (Incomplete)
Family Medicine Teaching [facility] Ad[facility] History and Physical Service Pager: [phone]  Patient name: Mason Hart Medical record number: [id number] Date of birth: Apr 26, 1956 Age: 65 y.o. Gender: male  Primary Care Provider: Claiborne RiggFleming, Zelda W, NP Consultants: none Code Status: Full Preferred Emergency Contact: Erroll Lunaerry Watson [phone]  Chief Complaint: altered mental status and weakness   Assessment and Plan: Mason CarpenJohn Costley is a 65 y.o. male presenting with extremity weakness and altered mental status. PMH is significant for alcohol use disorder, hypertension, hypothyroidism and seizure disorder.   Altered mental status  Weakness  Fall Patient has recent hospitalization in November 2021 after he was found down in the grass after walking to the liquor store, during this time was also noted to have anemia that required transfusion and IV iron which resolved his anemia and he was discharged. Today, patient apparently found unresponsive by stepson in his family friend's home where he is living with known fall. Patient found down for an uknown amount of time with unknown time of last known normal. Here in the ED he is complaining of generalized weakness.  Patient presented with Na 150, Ethanol <10, Hgb 5.9, WBC 12.4, lactic acid wnl. CK 824 on admission, concern for possible rhabdomyolysis given recent fall. S/p LR 1L in the ED. On exam, AOx1 and noted to have 5/5 UE and LE strength. Given patient's history, seizure is a possibility as patient's compliance is questionable. Has several admissions for seizure activity and being found unresponsive. Possibly postictal. Pending CT head and consider EEG. Obtain keppra level. Concern for subdural hematoma given fall. Also on the differential although low suspicion for CNS infection, tumor and possible hemorrhage or subdural hematoma which CT head will determine. Unclear as to whether patient had a syncopal episode, no known history of atrial  fibrillation or arrhythmia. Pt is bradycardic with elevated QTc (553). Will obtain troponin. Encephalopathic causes potentially due to electrolyte imbalance or hepatic encephalopathy, awaiting CT head. Electrolyte abnormalities including hypernatremia could cause a degree of confusion as well. Continue with further pending testing and monitor. Pt mildly uremic. No hypoglycemia. Ammonia normal. Decontamination delayed ability for patient to get CT head in a timely manner.  -Admit to FPTS, attending Dr. Jennette KettleNeal -pending CT head -125 ml/hr 0.45 NS -Q6hr BMP  -NPO -seizure precautions -delirium precautions  -f/u Trops, UA and UDS -f/u Keppra level -f/u am labs -consider echo -PT/OT evaluation -SLP evaluation  -up with assistance  -monitor mental status    Leukocytosis  WBC 12.4 with CXR notable right midlung opacity. COVID negative on presentation. Concern for infectious etiology given imaging and leukocytosis, with possible CAP. Also possibility of seizure causing elevated WBC. LA normal. CXR notable for right midlung opacity with concern for possible pneumonia. Given azithromycin and 1 dose of ceftriaxone in the ED. -ceftriaxone for total 5 day course -azithromycin for total 3 day course  -pending repeat CXR for better imaging   Generalized edema Noted to have generalized edema on exam, more prominently noted along left LE and right UE. Edematous presentation possibly due to cirrhotic state although unclear as to asymmetry of edema findings noted, possible lymphadema. Less concern for CHF exacerbation given CXR results and BNP wnl. Given concern for DVT, left LE US performed and demonstrated no concern for DVT.   -pending right UE US  Bedbugs Full infestation of bed bugs located systemically in the setting of poor living situation. Multiple hyperpigmented areas and excoriations with pinpoint areas of bleeding along with areas of thickened skin noted on  exam. Decontamination performed.   -topical benadryl  -order placed to bathe patient  -contact precautions in place  Normocytic anemia Hgb 5.9, given 1 unit pRBC in the ED. Likely secondary multifactorial due to bedbugs and extensive excoriations and possibly due to iron deficiency anemia or macrocytic anemia given alcoholic history. No known source of bleeding. Patient may benefit from IV iron given bedbugs. Currently receiving 1 unit pRBC. Unknown if patient has history of CAD, transfusion threshold for now is 7.  -f/u Hgb after 2nd unit pRBC -f/u anemia panel  -consider IV iron if appropriate   Thrombocytopenia  On admission platelet count of 75, appears to be down trending per chart review from prior hospitalizations, likely secondary to extensive history of alcohol use.  -discontinue Lovenox at <50 -continue to monitor CBC   History of alcoholism  Cirrhosis  Elevated transaminase  On admission ethanol <10, patient's social situation enables him to still consume alcohol regularly. Mildly elevated AST 59 and ALT wnl, likely due to chronic alcoholism causing hepatic dysfunction. Will continue to monitor for signs of withdrawal.  -monitor CIWAs with Ativan per protocol -f/u CMP -pending PT-INR and PTT -calculate MELD score once INR returns  -seizure precautions  -thiamine -folic acid -multivitamin   HTN Normotensive BP on admission at 116/69. No known home medications. -monitor BP, add anti-hypertensive as needed.  Hypothyroidism  Per chart review, TSH wnl from 04/2020 and was previously on synthroid which was discontinued. -pending TSH  Seizure disorder Known history of seizure disorder but unclear as to most recent seizure episode given patient's limited history due to mental status changes. Home medications include Keppra 1000 mg which patient is noted to take. -consider neurology consult for possible EEG -pending Keppra level  -continue home Keppra  Hypernatremia  Na 150, likely secondary to  dehydration given patient's poor social situation. Expected to improve with adequate hydration. Differentials include seizure, central diabetes insipidus, pituitary disorder and trauma. Further workup pending including CT head. -125 ml/hr 0.45 NS -pending CT head -f/u BMP -consider work up including ADH and urine osmolality levels  Protein calorie malnutrition  Albumin 2.0 likely in the setting of protein calorie malnutrition given possible concern that patient may not be eating properly and maintaining adequate hydration.  -consider nutrition consult -patient may benefit from nutrition supplement   Social concerns  Patient has ongoing poor living situation. Lives with family friend who is supposed to ensure that patient is properly caring for himself. Form last hospitalization, patient approved for home health but patient is not receiving these services as home health will not come into the house given poor living conditions. There are concerns that patient not properly taking care of himself and is still drinking with extensive alcohol history since family friend he lives with also consumes alcohol.  -attempt made to touch base with stepson and other emergency contacts available regarding history and events surrounding this admission but no answer, secure message left when able, convey to day team to reach out again in the morning  -consider SW involvement to ensure patient has safe disposition at discharge   FEN/GI: NPO pending SLP evaluation  Prophylaxis: SCDs  Disposition: admit to inpatient, attending Dr. Jennette Kettle  History of Present Illness:  Sabre Leonetti is a 65 y.o. male presenting with generalized weakness and altered mental status. Patient is seen by health and wellness for primary care needs. He has a current poor living situation where he lives with a family friend who is supposed to ensure that he  is taking care of himself. Doreene Adas is very involved and comes to bring him food daily but  does not always come inside the house given poor living situation.   Per sign out report, patient ambulates with assistance of walker. Prior fall history is unclear. Doreene Adas found patient unresponsive on the ground of his place of residence. Concern that patient not maintaining adequate nutrition and hydration daily given his poor presentation on admission.   Noted to have a productive cough that started today but denies any phlegm. States that his arms and legs typically don't swell but he noticed his right arm and left leg swell up today. Denies fever, chills, recent sick contacts, hematochezia, hemoptysis, hematemesis and dysuria. Reports that he lives by himself and that he fell. Unsure of how he fell but states he has had multiple falls. All he remembers that his stepson brought him to the ED. Typically uses a walker to ambulate but has been using a wheelchair recently. Denies receiving COVID or flu vaccines. Endorses alcohol use about at least once a week or whenever he has money to buy it. Last drink was today, he drank beer and liquor.   Level V caveat: patient is poor historian due to mental status change vs dementia.   Review Of Systems: Per HPI with the following additions:   Review of Systems  Unable to perform ROS: Mental status change     Patient Active Problem List   Diagnosis Date Noted  . Lactic acidosis 10/21/2020  . Metabolic acidosis 10/21/2020  . Hepatic cirrhosis (HCC) 05/26/2020  . Seizure disorder (HCC) 04/29/2020  . Gait abnormality 04/29/2020  . Acute respiratory failure (HCC) 04/13/2020  . Alcohol intoxication with delirium (HCC) 06/04/2019  . Pressure injury of skin 03/19/2019  . Breakthrough seizure (HCC) 03/18/2019  . Edema of left lower extremity 03/18/2019  . Chronic anemia 03/18/2019  . Anemia, normocytic normochromic 12/05/2018  . Endotracheally intubated 12/05/2018  . Hypothermia 12/05/2018  . Hyponatremia 12/05/2018  . Postictal state (HCC)  12/05/2018  . Sinusitis 12/05/2018  . Abnormal CXR 12/05/2018  . Hypotension 12/05/2018  . Bradycardia 12/05/2018  . ARF (acute renal failure) (HCC) 11/14/2018  . Clavicle fracture 11/14/2018  . Hypokalemia 06/24/2017  . Hypoglycemia 06/24/2017  . Alcohol use 06/24/2017  . Seizure (HCC) 06/24/2017  . Noncompliance with medication regimen   . Seizures (HCC)     Past Medical History: Past Medical History:  Diagnosis Date  . Alcohol abuse   . Hypertension   . Hypothyroidism   . Noncompliance with medication regimen   . RBBB (right bundle branch block)   . Seizures (HCC)     Past Surgical History: Past Surgical History:  Procedure Laterality Date  . NO PAST SURGERIES      Social History: Social History   Tobacco Use  . Smoking status: Never Smoker  . Smokeless tobacco: Never Used  Vaping Use  . Vaping Use: Never used  Substance Use Topics  . Alcohol use: Yes    Comment: 1-2 beers per day- 42 oz, occasional liquor  . Drug use: No   Additional social history:  Please also refer to relevant sections of EMR.  Family History: Family History  Problem Relation Age of Onset  . Heart disease Mother   . Other Father        unsure of medical history  . Cancer Neg Hx   . Diabetes Neg Hx   . Stroke Neg Hx     Allergies and Medications: No  Known Allergies No current facility-administered medications on file prior to encounter.   Current Outpatient Medications on File Prior to Encounter  Medication Sig Dispense Refill  . ferrous sulfate 325 (65 FE) MG tablet Take 1 tablet (325 mg total) by mouth daily. 30 tablet 3  . levETIRAcetam (KEPPRA) 1000 MG tablet Take 1 tablet (1,000 mg total) by mouth 2 (two) times daily. Last fill! Must keep appointment. 180 tablet 4    Objective: BP 107/63 (BP Location: Right Arm)   Pulse (!) 57   Temp 98.4 F (36.9 C) (Oral)   Resp 20   Ht 6\' 1"  (1.854 m)   Wt 91.9 kg   SpO2 100%   BMI 26.73 kg/m  Exam: General: Patient laying  in bed, in no acute distress. Eyes: no scleral icterus  Neck: supple, no evidence of lymphadenopathy  Cardiovascular: tachycardic, no murmurs or gallops auscultated Respiratory: lungs clear to auscultation bilaterally anteriorly, unable to assess posteriorly Gastrointestinal: soft, nontender, no evidence of ascites or fluid wave MSK: no calf tenderness noted bilaterally, significant edema noted along right UE and left LE, left pedal edema noted Derm: skin cool to touch peripherally, thickened skin with excoriations systemically with bedbugs present, 1-2 cm laceration noted above right eye and right parietal region  Neuro: AOx1 (self), incoherent speech at times, follows all commands, easily arousable to voice, 5/5 strength UE and LE bilaterally, gross sensation intact  Psych: no agitation noted   Labs and Imaging: CBC BMET  Recent Labs  Lab 01/02/21 1728  WBC 12.4*  HGB 5.9*  HCT 20.9*  PLT 75*   Recent Labs  Lab 01/02/21 1728  NA 150*  K 3.7  CL 118*  CO2 22  BUN 27*  CREATININE 1.16  GLUCOSE 115*  CALCIUM 8.4*     EKG: bradycardia  DG Chest Port 1 View  Result Date: 01/02/2021 CLINICAL DATA:  Found down EXAM: PORTABLE CHEST 1 VIEW COMPARISON:  October 21, 2020 FINDINGS: The cardiomediastinal silhouette is unchanged in contour.Tortuous thoracic aorta. No pleural effusion. No pneumothorax. RIGHT midlung predominately linear reticulonodular opacities. Visualized abdomen is unremarkable. Remote LEFT clavicular fracture. Degenerative changes of the RIGHT shoulder. IMPRESSION: RIGHT midlung predominately linear reticulonodular opacities. Differential considerations include atelectasis, infection or artifact from superimposed osseous structures. This could be better assessed with dedicated PA and lateral chest radiograph. Electronically Signed   By: Meda Klinefelter MD   On: 01/02/2021 18:17   VAS [place] LOWER EXTREMITY VENOUS (DVT) (MC and [facility] 7a-7p)  Result Date: 01/02/2021  Lower  Venous DVT Study Indications: Edema. Other Indications: Severe bed bugs. Limitations: Body habitus and poor ultrasound/tissue interface. Comparison Study: LLE 4/20 Negative Performing Technologist: Clint Guy RVT  Examination Guidelines: A complete evaluation includes B-mode imaging, spectral Doppler, color Doppler, and power Doppler as needed of all accessible portions of each vessel. Bilateral testing is considered an integral part of a complete examination. Limited examinations for reoccurring indications may be performed as noted. The reflux portion of the exam is performed with the patient in reverse Trendelenburg.  +---------+---------------+---------+-----------+----------+--------------+ LEFT     CompressibilityPhasicitySpontaneityPropertiesThrombus Aging +---------+---------------+---------+-----------+----------+--------------+ CFV      Full           Yes      Yes                                 +---------+---------------+---------+-----------+----------+--------------+ SFJ      Full                                                        +---------+---------------+---------+-----------+----------+--------------+  FV Prox  Full                                                        +---------+---------------+---------+-----------+----------+--------------+ FV Mid   Full                                                        +---------+---------------+---------+-----------+----------+--------------+ FV DistalFull                                                        +---------+---------------+---------+-----------+----------+--------------+ PFV      Full                                                        +---------+---------------+---------+-----------+----------+--------------+ POP      Full           Yes      Yes                                 +---------+---------------+---------+-----------+----------+--------------+  Summary: LEFT: - There is  no evidence of deep vein thrombosis in the lower extremity. However, portions of this examination were limited- see technologist comments above.  - No cystic structure found in the popliteal fossa.  *See table(s) above for measurements and observations.    Preliminary     Reece Leader, DO 01/02/2021, 7:05 PM PGY-1, [facility] Family Medicine FPTS Intern pager: [phone], text pages welcome

## 2021-01-03 NOTE — ED Notes (Signed)
Patient rectal temp 87.7. Bair hugger applied, MD paged.

## 2021-01-03 NOTE — ED Notes (Signed)
MD returned page, coming to bedside to see patient.

## 2021-01-03 NOTE — ED Notes (Signed)
Paged admitting per RN  

## 2021-01-03 NOTE — Progress Notes (Signed)
Spoke with pt's [employer]. Pt cares for himself at his home and lives with a roommate. [name] is POA and helps him with food, bills and doctor's appointments. [name] went into the home yesterday and found him on the floor.  [name] saw him on Wednesday and he looked totally different. Talked on the phone with him Saturday night when he got there to put groceries in the house he said he .    Not sure if the pt is getting his medications as he should. Does not feel like he is safe to live by himself. Complaining of back pain. House is infested with bedbugs and roaches. [name] cares for his mom (pt wife). States pt is a Full Code.   Swelling in his arm and leg is not new. RUE has been after his November admission. Two weeks ago pt's roommate told [name] that pt's arms started swelling and needed to go to the hospital.   [name] knows that he pt drinks beer and liquor and drinks at least a couple of times a week. His roommate gets him alcohol.   [name] has been trying to get him another place to live but does not feel as if he can take care of himself. [name] would like for him to go to SNF, if recommended. States the pt can not return home.[name] #[phone].   Katha Cabal, DO PGY-2, [facility] 01/03/2021 6:08 AM

## 2021-01-03 NOTE — Progress Notes (Signed)
Family Medicine Teaching Service Daily Progress Note Intern Pager: [phone]  Patient name: Mason Hart Medical record number: [id number] Date of birth: 02/27/1956 Age: 65 y.o. Gender: male  Primary Care Provider: [name], NP Consultants: CCM Code Status: Full  Pt Overview and Major Events to Date:  2/6: admitted  Assessment and Plan:  Mason Hart is a 65 y.o. male presenting with extremity weakness and altered mental status. PMH is significant for alcohol use disorder, hypertension, hypothyroidism and seizure disorder.   Sepsis 2/2 likely PNA Hypotension  Hypothermia Patient's temp improved to 94.3 this morning with bair hugger and warmed IV fluids. Remains hypotensive with MAPs ~60. CXR with bilateral L>R opacities concerning for PNA and leukocytosis noted on labs.  -Continue Ceftriaxone and Azithromycin -Frequent BP checks and temperature checks -Continue bair hugger and warm IV fluids -Patient currently receiving 1L NS bolus, will give additional bolus and then maintenance with D5NS  Altered Mental Status  Fall Etiology remains unclear- possibly secondary to seizure activity given his hx of seizures and unclear compliance with Keppra (also leukocytosis and mildly elevated AST on admission c/w seizure activity). Could be secondary to infection (pneumonia on CXR, hypothermia, hypotension all pointing to sepsis-like picture). Less likely intracranial abnormality given CT head without bleed or ischemia. Labs not consistent with metabolic or hepatic encephalopathy. UA and UDS pending. Patient is awake and alert but oriented only to self on neuro exam this morning.  -f/u Keppra level -f/u UA and UDS -Seizure precautions -Delirium precautions -PT/OT eval -NPO pending SLP eval  Acute on Chronic Normocytic Anemia Hgb 5.9 on admission, improved to 7.0 this morning (has received 2u pRBCs, has 1 additional unit ordered). No known source of bleeding although patient was FOBT+.  Has never had colonoscopy. Iron panel unremarkable, vit B12 above normal limits and folate was normal. Hgb 5.5-8.0 on prior admission in Nov 2021. -CTA abdomen/pelvis -Consider GI consult once sepsis picture has stabilized -f/u post transfusion H&H  Rhabdomyolysis CK 824 on admission after patient was found down for unknown amount of time. Creatinine wnl at 1.04. UA pending. -s/p 1L LR bolus x2 -Now receiving 1L NS bolus x2 -Will then start D5NS at 130 cc/hr -Trend CK  Hypernatremia Most recent Na 151.  -IV fluids as discussed above -Frequent BMP monitoring  Hypokalemia K 2.9. -Replete with 10 mEq IV potassium x4 -Frequent BMP checks  Anarsarca  Hypoalbuminemia Patient with fairly diffuse extremity swelling. Likely chronic in nature. Ultrasound of L lower extremity negative for DVT. -Continue to monitor -f/u upper extremity DVT  Seizure Disorder States he had a seizure last week, although patient is altered and this is unclear. Patient takes Keppra at home -f/u Keppra level -Restart home Keppra (will give IV due to NPO status)  Hx of Alcohol Use Disorder  Cirrhosis Ethanol level <10 on admission, although brother states he continues to drink alcohol. -CIWA monitoring -Daily thiamine, folic acid  Thrombocytopenia Chronic, stable. Plt 79 this morning. Likely 2/2 alcohol use disorder -Daily CBC -D/c Lovenox if platelets <50  Hypothyroidism TSH wnl on admission. -Continue home Synthroid  Protein Calorie Malnutrition -Nutrition consult  Bedbugs S/p decontamination. -Topical Benadryl -Contact precautions   FEN/GI: NPO pending SLP eval PPx: SCDs   Status is: Inpatient Remains inpatient appropriate because:Inpatient level of care appropriate due to severity of illness   Dispo: The patient is from: Home              Anticipated d/c is to: TBD, likely SNF  Anticipated d/c date is: > 3 days              Patient currently is not medically stable to  d/c.   Difficult to place patient No    Subjective:  Patient denies complaints this morning. He is unable to tell me why he's in the hospital.  Objective: Temp:  [87.7 F (30.9 C)-98.9 F (37.2 C)] 90.6 F (32.6 C) (02/07 0645) Pulse Rate:  [44-90] 47 (02/07 0411) Resp:  [10-36] 15 (02/07 0600) BP: (83-136)/(51-88) 88/52 (02/07 0600) SpO2:  [96 %-100 %] 100 % (02/07 0200) Weight:  [91.9 kg] 91.9 kg (02/06 1602) Physical Exam: General: alert, ill-appearing HEENT: PERRL, EOMI Cardiovascular: RRR, normal S1/S2 Respiratory: normal WOB on room air Abdomen: soft, nontender, nondistended Extremities: diffuse 2+ pitting edema with weeping fluid in bilateral upper extremities and L lower extremity.  Neuro: oriented to self only, follows commands appropriately  Laboratory: Recent Labs  Lab 01/02/21 1728 01/03/21 0224  WBC 12.4* 8.0  HGB 5.9* 6.1*  HCT 20.9* 19.7*  PLT 75* 69*   Recent Labs  Lab 01/02/21 1728 01/03/21 0153 01/03/21 0337  NA 150* 151* 151*  K 3.7 3.4* 2.9*  CL 118* 120* 118*  CO2 22 18* 22  BUN 27* 25* 23  CREATININE 1.16 1.04 1.01  CALCIUM 8.4* 8.3* 8.4*  PROT 6.1*  --  5.7*  BILITOT 0.7  --  0.6  ALKPHOS 59  --  63  ALT 28  --  24  AST 59*  --  40  GLUCOSE 115* 92 88    Imaging/Diagnostic Tests: CT Head Wo Contrast Result Date: 01/03/2021 IMPRESSION: 1. No acute finding by CT. Age related atrophy and chronic small-vessel ischemic changes of the white matter. 2. Chronic inflammatory changes of the paranasal sinuses, more pronounced on the left than the right. Complete opacification of the sphenoid sinus left division.   DG Chest Portable 1 View Result Date: 01/03/2021 IMPRESSION: Increasing asymmetric and vague bilateral pulmonary opacities suggestive of bilateral pneumonia, greater on the left. Aspiration also a consideration in this setting. Asymmetric pulmonary edema not favored at this time.   DG Chest Port 1 View Result Date:  01/02/2021 IMPRESSION: RIGHT midlung predominately linear reticulonodular opacities. Differential considerations include atelectasis, infection or artifact from superimposed osseous structures. This could be better assessed with dedicated PA and lateral chest radiograph.   VAS [place] LOWER EXTREMITY VENOUS (DVT) (MC and WL 7a-7p) Result Date: 01/02/2021 Summary: LEFT: - There is no evidence of deep vein thrombosis in the lower extremity. However, portions of this examination were limited- see technologist comments above.  - No cystic structure found in the popliteal fossa.  *See table(s) above for measurements and observations.    Maury Dus, MD 01/03/2021, 7:00 AM PGY-1, [facility] Family Medicine FPTS Intern pager: [phone], text pages welcome

## 2021-01-04 ENCOUNTER — Inpatient Hospital Stay (HOSPITAL_COMMUNITY): Payer: Medicare Other

## 2021-01-04 DIAGNOSIS — R9389 Abnormal findings on diagnostic imaging of other specified body structures: Secondary | ICD-10-CM | POA: Diagnosis not present

## 2021-01-04 DIAGNOSIS — A419 Sepsis, unspecified organism: Secondary | ICD-10-CM

## 2021-01-04 DIAGNOSIS — R4182 Altered mental status, unspecified: Secondary | ICD-10-CM | POA: Diagnosis not present

## 2021-01-04 DIAGNOSIS — I5189 Other ill-defined heart diseases: Secondary | ICD-10-CM

## 2021-01-04 DIAGNOSIS — E039 Hypothyroidism, unspecified: Secondary | ICD-10-CM | POA: Diagnosis present

## 2021-01-04 DIAGNOSIS — R0902 Hypoxemia: Secondary | ICD-10-CM | POA: Diagnosis not present

## 2021-01-04 DIAGNOSIS — I9589 Other hypotension: Secondary | ICD-10-CM

## 2021-01-04 DIAGNOSIS — Z789 Other specified health status: Secondary | ICD-10-CM

## 2021-01-04 DIAGNOSIS — R531 Weakness: Secondary | ICD-10-CM | POA: Diagnosis not present

## 2021-01-04 DIAGNOSIS — E8770 Fluid overload, unspecified: Secondary | ICD-10-CM | POA: Diagnosis not present

## 2021-01-04 DIAGNOSIS — Z7189 Other specified counseling: Secondary | ICD-10-CM

## 2021-01-04 DIAGNOSIS — R0602 Shortness of breath: Secondary | ICD-10-CM

## 2021-01-04 DIAGNOSIS — J9601 Acute respiratory failure with hypoxia: Secondary | ICD-10-CM

## 2021-01-04 DIAGNOSIS — Z515 Encounter for palliative care: Secondary | ICD-10-CM

## 2021-01-04 LAB — BPAM RBC
Blood Product Expiration Date: 202203012359
Blood Product Expiration Date: 202203082359
Blood Product Expiration Date: 202203082359
Blood Product Expiration Date: 202203092359
ISSUE DATE / TIME: 202202062042
ISSUE DATE / TIME: 202202070347
ISSUE DATE / TIME: 202202071205
ISSUE DATE / TIME: 202202071832
Unit Type and Rh: 5100
Unit Type and Rh: 5100
Unit Type and Rh: 5100
Unit Type and Rh: 5100

## 2021-01-04 LAB — URINALYSIS, MICROSCOPIC (REFLEX): Squamous Epithelial / HPF: NONE SEEN (ref 0–5)

## 2021-01-04 LAB — BASIC METABOLIC PANEL
Anion gap: 8 (ref 5–15)
Anion gap: 10 (ref 5–15)
BUN: 15 mg/dL (ref 8–23)
BUN: 14 mg/dL (ref 8–23)
CO2: 21 mmol/L — ABNORMAL LOW (ref 22–32)
CO2: 21 mmol/L — ABNORMAL LOW (ref 22–32)
Calcium: 7.2 mg/dL — ABNORMAL LOW (ref 8.9–10.3)
Calcium: 7.5 mg/dL — ABNORMAL LOW (ref 8.9–10.3)
Chloride: 123 mmol/L — ABNORMAL HIGH (ref 98–111)
Chloride: 122 mmol/L — ABNORMAL HIGH (ref 98–111)
Creatinine, Ser: 1.02 mg/dL (ref 0.61–1.24)
Creatinine, Ser: 1.25 mg/dL — ABNORMAL HIGH (ref 0.61–1.24)
GFR, Estimated: >60 mL/min (ref >60)
GFR, Estimated: >60 mL/min (ref >60)
Glucose, Bld: 67 mg/dL — ABNORMAL LOW (ref 70–99)
Glucose, Bld: 71 mg/dL (ref 70–99)
Potassium: 3.9 mmol/L (ref 3.5–5.1)
Potassium: 3.6 mmol/L (ref 3.5–5.1)
Sodium: 152 mmol/L — ABNORMAL HIGH (ref 135–145)
Sodium: 153 mmol/L — ABNORMAL HIGH (ref 135–145)

## 2021-01-04 LAB — TYPE AND SCREEN
ABO/RH(D): O POS
Antibody Screen: NEGATIVE
Unit division: 0
Unit division: 0
Unit division: 0
Unit division: 0

## 2021-01-04 LAB — CBC
HCT: 31.3 % — ABNORMAL LOW (ref 39.0–52.0)
HCT: 31.5 % — ABNORMAL LOW (ref 39.0–52.0)
Hemoglobin: 9.4 g/dL — ABNORMAL LOW (ref 13.0–17.0)
Hemoglobin: 9.6 g/dL — ABNORMAL LOW (ref 13.0–17.0)
MCH: 25.7 pg — ABNORMAL LOW (ref 26.0–34.0)
MCH: 26 pg (ref 26.0–34.0)
MCHC: 30 g/dL (ref 30.0–36.0)
MCHC: 30.5 g/dL (ref 30.0–36.0)
MCV: 85.5 fL (ref 80.0–100.0)
MCV: 85.4 fL (ref 80.0–100.0)
Platelets: 79 10*3/uL — ABNORMAL LOW (ref 150–400)
Platelets: 85 10*3/uL — ABNORMAL LOW (ref 150–400)
RBC: 3.66 MIL/uL — ABNORMAL LOW (ref 4.22–5.81)
RBC: 3.69 MIL/uL — ABNORMAL LOW (ref 4.22–5.81)
RDW: 20.7 % — ABNORMAL HIGH (ref 11.5–15.5)
RDW: 21.1 % — ABNORMAL HIGH (ref 11.5–15.5)
WBC: 12.7 10*3/uL — ABNORMAL HIGH (ref 4.0–10.5)
WBC: 14.9 10*3/uL — ABNORMAL HIGH (ref 4.0–10.5)
nRBC: 0.8 % — ABNORMAL HIGH (ref 0.0–0.2)
nRBC: 0.6 % — ABNORMAL HIGH (ref 0.0–0.2)

## 2021-01-04 LAB — URINALYSIS, ROUTINE W REFLEX MICROSCOPIC
Bilirubin Urine: NEGATIVE
Glucose, UA: NEGATIVE mg/dL
Ketones, ur: NEGATIVE mg/dL
Leukocytes,Ua: NEGATIVE
Nitrite: NEGATIVE
Protein, ur: NEGATIVE mg/dL
Specific Gravity, Urine: 1.015 (ref 1.005–1.030)
pH: 5 (ref 5.0–8.0)

## 2021-01-04 LAB — I-STAT ARTERIAL BLOOD GAS, ED
Acid-Base Excess: 0 mmol/L (ref 0.0–2.0)
Bicarbonate: 24.2 mmol/L (ref 20.0–28.0)
Calcium, Ion: 1.18 mmol/L (ref 1.15–1.40)
HCT: 27 % — ABNORMAL LOW (ref 39.0–52.0)
Hemoglobin: 9.2 g/dL — ABNORMAL LOW (ref 13.0–17.0)
O2 Saturation: 97 %
Potassium: 3.3 mmol/L — ABNORMAL LOW (ref 3.5–5.1)
Sodium: 156 mmol/L — ABNORMAL HIGH (ref 135–145)
TCO2: 25 mmol/L (ref 22–32)
pCO2 arterial: 38.6 mmHg (ref 32.0–48.0)
pH, Arterial: 7.405 (ref 7.350–7.450)
pO2, Arterial: 93 mmHg (ref 83.0–108.0)

## 2021-01-04 LAB — URINE CULTURE: Culture: NO GROWTH

## 2021-01-04 LAB — ECHOCARDIOGRAM COMPLETE
Area-P 1/2: 2.09 cm2
Height: 73 in
P 1/2 time: 518 msec
S' Lateral: 3.9 cm
Weight: 3241.64 oz

## 2021-01-04 LAB — BRAIN NATRIURETIC PEPTIDE: B Natriuretic Peptide: 219.1 pg/mL — ABNORMAL HIGH (ref 0.0–100.0)

## 2021-01-04 LAB — HEMOGLOBIN AND HEMATOCRIT, BLOOD
HCT: 29.8 % — ABNORMAL LOW (ref 39.0–52.0)
Hemoglobin: 9.3 g/dL — ABNORMAL LOW (ref 13.0–17.0)

## 2021-01-04 LAB — GLUCOSE, CAPILLARY
Glucose-Capillary: 52 mg/dL — ABNORMAL LOW (ref 70–99)
Glucose-Capillary: 82 mg/dL (ref 70–99)
Glucose-Capillary: 85 mg/dL (ref 70–99)
Glucose-Capillary: 86 mg/dL (ref 70–99)
Glucose-Capillary: 93 mg/dL (ref 70–99)

## 2021-01-04 LAB — CBG MONITORING, ED: Glucose-Capillary: 71 mg/dL (ref 70–99)

## 2021-01-04 LAB — LACTIC ACID, PLASMA: Lactic Acid, Venous: 1.1 mmol/L (ref 0.5–1.9)

## 2021-01-04 LAB — PROCALCITONIN: Procalcitonin: 0.23 ng/mL

## 2021-01-04 LAB — PATHOLOGIST SMEAR REVIEW

## 2021-01-04 LAB — MRSA PCR SCREENING: MRSA by PCR: NEGATIVE

## 2021-01-04 LAB — CORTISOL: Cortisol, Plasma: 20.6 ug/dL

## 2021-01-04 LAB — T4, FREE: Free T4: 0.74 ng/dL (ref 0.61–1.12)

## 2021-01-04 MED ORDER — DEXTROSE 10 % IV SOLN: INTRAVENOUS | Status: DC

## 2021-01-04 MED ORDER — SODIUM CHLORIDE 0.9 % IV SOLN
250.0000 mL | INTRAVENOUS | Status: DC
  Administered 2021-01-04: 18:00:00 250 mL via INTRAVENOUS

## 2021-01-04 MED ORDER — CHLORHEXIDINE GLUCONATE CLOTH 2 % EX PADS
6.0000 | MEDICATED_PAD | Freq: Every day | CUTANEOUS | Status: DC
  Administered 2021-01-04 – 2021-01-13 (×10): 6 via TOPICAL

## 2021-01-04 MED ORDER — NOREPINEPHRINE 4 MG/250ML-% IV SOLN
2.0000 ug/min | INTRAVENOUS | Status: DC
  Administered 2021-01-04: 4 ug/min via INTRAVENOUS
  Filled 2021-01-04: qty 250

## 2021-01-04 MED ORDER — HEPARIN SODIUM (PORCINE) 5000 UNIT/ML IJ SOLN
5000.0000 [IU] | Freq: Three times a day (TID) | INTRAMUSCULAR | Status: DC
  Administered 2021-01-04 – 2021-01-05 (×4): 5000 [IU] via SUBCUTANEOUS
  Filled 2021-01-04 (×4): qty 1

## 2021-01-04 MED ORDER — ALBUMIN HUMAN 25 % IV SOLN
25.0000 g | Freq: Once | INTRAVENOUS | Status: AC
  Administered 2021-01-04: 25 g via INTRAVENOUS
  Filled 2021-01-04: qty 100

## 2021-01-04 MED ORDER — DEXTROSE 50 % IV SOLN
INTRAVENOUS | Status: AC
  Administered 2021-01-04: 25 mL
  Filled 2021-01-04: qty 50

## 2021-01-04 MED ORDER — PANTOPRAZOLE SODIUM 40 MG IV SOLR
40.0000 mg | INTRAVENOUS | Status: DC
  Administered 2021-01-04 – 2021-01-06 (×3): 40 mg via INTRAVENOUS
  Filled 2021-01-04 (×3): qty 40

## 2021-01-04 MED ORDER — ORAL CARE MOUTH RINSE
15.0000 mL | Freq: Two times a day (BID) | OROMUCOSAL | Status: DC
  Administered 2021-01-04 – 2021-01-13 (×18): 15 mL via OROMUCOSAL

## 2021-01-04 MED ORDER — SODIUM CHLORIDE 0.9 % IV SOLN
2.0000 g | INTRAVENOUS | Status: DC
  Administered 2021-01-04: 2 g via INTRAVENOUS
  Filled 2021-01-04: qty 20

## 2021-01-04 MED ORDER — CHLORHEXIDINE GLUCONATE 0.12 % MT SOLN
15.0000 mL | Freq: Two times a day (BID) | OROMUCOSAL | Status: DC
  Administered 2021-01-04 – 2021-01-13 (×17): 15 mL via OROMUCOSAL
  Filled 2021-01-04 (×10): qty 15

## 2021-01-04 MED ORDER — DEXTROSE 50 % IV SOLN
1.0000 | Freq: Once | INTRAVENOUS | Status: DC
  Filled 2021-01-04: qty 50

## 2021-01-04 MED ORDER — FUROSEMIDE 10 MG/ML IJ SOLN
20.0000 mg | Freq: Once | INTRAMUSCULAR | Status: AC
  Administered 2021-01-04: 20 mg via INTRAVENOUS
  Filled 2021-01-04: qty 2

## 2021-01-04 NOTE — ED Notes (Signed)
51M nurse called back and stated a nurse is coming in at 64 and as soon as she is here she will call back and get report

## 2021-01-04 NOTE — Progress Notes (Signed)
FPTS Interim Progress Note  S: Went to assess patient.    O: BP (!) 84/51   Pulse 88   Temp 99 F (37.2 C)   Resp (!) 25   Ht 6\' 1"  (1.854 m)   Wt 91.9 kg   SpO2 98%   BMI 26.73 kg/m    Physical Exam:  General: 65 y.o. male sitting with HOB elevated in stretcher. Cardio: RRR no m/r/g Lungs: decreased A/E bilaterally, crackles heard over entire lung fields Abdomen: Soft, non-tender to palpation, non-distended, positive bowel sounds Skin: warm and dry Extremities: edematous RUE>LUE and LLL>RLL, apparently this is chronic.  Distal pulses are present  A/P: Acute Resp Failure  Hypotension Pulmonary Edema Continues to have copious amounts of thick tenacious frothy sputum. Appears somnolent and opens eyes to speech.  His oriented to place only. Map<60 s/p IV lasix 20 mg.   -ABG now -CCM called, suggest dose of Albumin, they will go and see patient.  Will wait for recommendations. -Continue to monitor closely   69, MD 01/04/2021, 7:43 AM PGY-2, [facility] Family Medicine Service pager [phone]

## 2021-01-04 NOTE — Progress Notes (Signed)
FPTS Interim Progress Note  S:Went to bedside along with Dr. Rachael Darby to check on patient. RN discontinued fluids and administering lasix, ordered by CCM, while in the room. Patient upright in bed, no frothy sputum at the mouth. BP 80/50 range upon reviewing monitors with most recent being 99/64 in the room.   O: BP 99/62   Pulse 87   Temp 97.6 F (36.4 C)   Resp (!) 29   Ht 6\' 1"  (1.854 m)   Wt 91.9 kg   SpO2 97%   BMI 26.73 kg/m   General: Patient sitting upright in bed, in no acute distress. Resp: breathing comfortably on 5L nasal canula, no increased work of breathing noted Neuro: AOx2  A/P: -lasix 20 mg once per CCM -continue to monitor fluid status -will reassess as appropriate   , DO 01/04/2021, 2:23 AM PGY-1, [facility] Family Medicine Service pager [phone]

## 2021-01-04 NOTE — ED Notes (Signed)
Attempted report 

## 2021-01-04 NOTE — Progress Notes (Signed)
Pt w/ small abrasions moister associated on buttock cheeks and sacrum. Does not present as PU.

## 2021-01-04 NOTE — Consult Note (Signed)
Consultation Note Date: 01/04/2021   Patient Name: Mason Hart  DOB: 1956/06/03  MRN: [id number]  Age / Sex: 65 y.o., male  PCP: Mason Pounds, NP Referring Physician: Lanier Clam, MD  Reason for Consultation: Establishing goals of care  HPI/Patient Profile: 65 y.o. male  with past medical history of seizures and ETOH abuse presented to the ED from home after an unwitnessed fall and unknown downtime. Patient was admitted on 01/02/2021 with AMS, sepsis of unknown source likely CAP, rhabdomyolysis, weakness/fall, acute on chronic anemia, and protein calorie malnutrition (albumin noted at 1.8 on 01/03/21).   Of note, patient does have social concerns of poor living conditions, which include roaches and bedbug infestation.   Patient and family face treatment option decisions, advanced directive decisions, and anticipatory care needs.   Clinical Assessment and Goals of Care: I have reviewed medical records including EPIC notes, labs, and imaging. Received report from primary RN - no acute concerns.   Went to visit patient at bedside - no family/visitors present. Patient was lying in bed - he does wake to voice/gentle touch and is able to participate in minimal conversation. Patient is lethargic and is only able to stay away for short periods of time. He is disoriented to date and situation. I do not feel he is able to make complex medical decisions today. No signs or non-verbal gestures of pain or discomfort noted. No respiratory distress or increased work of breathing noted. Patient has secretions and sounds congested.  Met with son/Mason Hart via phone  to discuss diagnosis, prognosis, GOC, EOL wishes, disposition, and options.  I introduced Palliative Medicine as specialized medical care for people living with serious illness. It focuses on providing relief from the symptoms and stress of a serious illness.  The goal is to improve quality of life for both the patient and the family.  We discussed a brief life review of the patient as well as functional and nutritional status. The patient is currently married and has been with his wife for over 40 years. They had 2 sons and 1 daughter. The patient's wife had a stroke in 2004 - at that time their son/Mason Hart became her primary caregiver. Mason Hart states that it was around this time he began noticing a decline in the patient's mental status. He became depressed, drank more, and began having more seizures. Prior to this hospitalization, the patient was living in a private residence with a roommate that was supposed to be assisting with his care. The roommate and the patient both are alcohol abusers per Mason Hart. Mason Hart brings the patient food/groceries each week - the roommate states the patient eats well. Mason Hart assumed the patient was eating as the food would be gone after every week. The roommate also tells Mason Hart the patient bathes; however, in his condition, Mason Hart feels this is not true. He feels the roommate may not be telling the truth but states "that's what they tell me so that's what I have to go on." Prior to his fall, the patient was able  to ambulate independently without assistance - Mason Hart states the patient would be able to carry his groceries into the house. Mason Hart states that he often does not actually go in the patient's house due the condition of the home - it is infested with bugs per Mason Hart. Mason Hart states these conditions are the patient's "normal" and it does not bother him. Mason Hart tells me he has been trying to get the patient into a nursing home for several months but did not know "where to turn" after some places he reached out to did not help him.  We discussed patient's current illness and what it means in the larger context of patient's on-going co-morbidities. Mason Hart has a somewhat clear understanding of the patient's current medical condition. We reviewed  and discussed the patient's acute medical situation in detail. After discussion, Mason Hart understands that the patient remains critically ill and is at a high risk for decline/decomensation. Natural disease trajectory and expectations at EOL were discussed. I attempted to elicit values and goals of care important to the patient. The difference between aggressive medical intervention and comfort care was considered in light of the patient's goals of care. Reviewed in detail what comfort measures entale. I reviewed lab work with Mason Hart - explained that despite treatment and interventions, [name] WBCs are continuing to trend up, which is not good. Education was provided on significance of elevated WBCs in context of infection. Mason Hart stated that his mother had sepsis once and was able to recover, he hopes the best for the patient. I expressed that I hope for the best too, but explained that each person responds differently to illness and treatment. Educated that we have to look at [name] holistically in context of his current situation - Mason Hart expressed understanding. I expressed concern that the patient's malnutrition did not leave his body with much reserve to fight the infection and how effects of chronic alcohol abuse can also make it harder to recover. Mason Hart's goal at this time is to continue full scope care. I recommended 24-48 hours of watchful waiting and if patient shows no improvement within that time or declines that we continue the conversation on comfort measures. Mason Hart was agreeable with this plan.   Advance directives and concepts specific to code status were considered and discussed. [name] does not have a living will or HCPOA. Mason Hart states that he is the patient's financial POA but not health care. Reviewed that legal decisions would come down to the patient's wife - Mason Hart states they have been discussing the patient and his care and the wife is also wanting full scope treatment at this  time. Encouraged family to consider DNR/DNI status understanding evidenced based poor outcomes in similar hospitalized patient, as the cause of arrest is likely associated with advanced chronic/terminal illness rather than an easily reversible acute cardio-pulmonary event. I explained that DNR/DNI does not change the medical plan and it only comes into effect after a person has arrested (died).  It is a protective measure to keep [place] from harming the patient in their last moments of life. Mason Hart was not agreeable to DNR/DNI with understanding that patient would receive CPR, defibrillation, ACLS medications, and intubation. Mason Hart states he previously had a discussion with the patient and he expressed he would want everything done. Mason Hart states the patient's wife does not want DNR/DNI at this time.   Discussed with Mason Hart the importance of continued conversation with family, the patient, and the medical providers regarding overall plan of care and treatment  options, ensuring decisions are within the context of the patient's values and GOCs.    Questions and concerns were addressed. The patient/family was encouraged to call with questions and/or concerns. PMT number was provided.  Primary Decision Maker: NEXT OF KIN -  Wife/Linda with assistance of son/Mason Hart Shon Baton who is her primary caregiver    SUMMARY OF RECOMMENDATIONS:  Continue full scope medical treatment  Continue full code status  Family would like 24-48 hours of watchful waiting - if patient declines or shows no improvement they are open to continue discussion on comfort measures  PMT will continue to follow and support holistically   Code Status/Advance Care Planning:  Full code  Palliative Prophylaxis:   Aspiration, Bowel Regimen, Delirium Protocol, Frequent Pain Assessment, Oral Care and Turn Reposition  Additional Recommendations (Limitations, Scope, Preferences):  Full Scope Treatment  Psycho-social/Spiritual:   Desire for  further Chaplaincy support:no Created space and opportunity for patient and family to express thoughts and feelings regarding patient's current medical situation.   Emotional support and therapeutic listening provided.  Prognosis:   Unable to determine guarded in context of his current acute medical situation in addition to malnutrition and chronic illnesses  Discharge Planning: To Be Determined      Primary Diagnoses: Present on Admission: . Altered mental status . Hypothyroidism . Thrombocytopenia ([place]) . Hypotension   I have reviewed the medical record, interviewed the patient and family, and examined the patient. The following aspects are pertinent.  Past Medical History:  Diagnosis Date  . Alcohol abuse   . Hypertension   . Hypothyroidism   . Noncompliance with medication regimen   . RBBB (right bundle branch block)   . Seizures ([place])    Social History   Socioeconomic History  . Marital status: Legally Separated    Spouse name: Not on file  . Number of children: 0  . Years of education: 70  . Highest education level: High school graduate  Occupational History  . Occupation: Retired  Tobacco Use  . Smoking status: Never Smoker  . Smokeless tobacco: Never Used  Vaping Use  . Vaping Use: Never used  Substance and Sexual Activity  . Alcohol use: Yes    Comment: 1-2 beers per day- 42 oz, occasional liquor  . Drug use: No  . Sexual activity: Not Currently    Birth control/protection: None  Other Topics Concern  . Not on file  Social History Narrative   Lives alone.   Right-handed.   Occasional use of caffeine.   Social Determinants of Health   Financial Resource Strain: Not on file  Food Insecurity: Not on file  Transportation Needs: Not on file  Physical Activity: Not on file  Stress: Not on file  Social Connections: Not on file   Family History  Problem Relation Age of Onset  . Heart disease Mother   . Other Father        unsure of medical  history  . Cancer Neg Hx   . Diabetes Neg Hx   . Stroke Neg Hx    Scheduled Meds: . chlorhexidine  15 mL Mouth Rinse BID  . Chlorhexidine Gluconate Cloth  6 each Topical Daily  . dextrose  1 ampule Intravenous Once  . folic acid  1 mg Oral Daily  . heparin injection (subcutaneous)  5,000 Units Subcutaneous Q8H  . mouth rinse  15 mL Mouth Rinse q12n4p  . multivitamin with minerals  1 tablet Oral Daily  . pantoprazole (PROTONIX) IV  40 mg  Intravenous Q24H  . thiamine  100 mg Oral Daily   Or  . thiamine  100 mg Intravenous Daily   Continuous Infusions: . sodium chloride    . cefTRIAXone (ROCEPHIN)  IV    . dextrose 50 mL/hr at 01/04/21 1600  . levETIRAcetam Stopped (01/04/21 1032)  . norepinephrine (LEVOPHED) Adult infusion 3 mcg/min (01/04/21 1600)   PRN Meds:.acetaminophen **OR** acetaminophen, diphenhydrAMINE-zinc acetate, LORazepam **OR** LORazepam Medications Prior to Admission:  Prior to Admission medications   Medication Sig Start Date End Date Taking? Authorizing Provider  ferrous sulfate 325 (65 FE) MG tablet Take 1 tablet (325 mg total) by mouth daily. 10/24/20 10/24/21 Yes Barb Merino, MD  levETIRAcetam (KEPPRA) 1000 MG tablet Take 1 tablet (1,000 mg total) by mouth 2 (two) times daily. Last fill! Must keep appointment. 04/29/20 07/23/21 Yes Marcial Pacas, MD   No Known Allergies Review of Systems  Unable to perform ROS: Acuity of condition    Physical Exam Vitals and nursing note reviewed.  Constitutional:      General: He is not in acute distress.    Appearance: He is cachectic. He is ill-appearing.  Pulmonary:     Effort: No respiratory distress.     Comments: Secretions and congestion Skin:    General: Skin is warm and dry.  Neurological:     Mental Status: He is lethargic.     Motor: Weakness present.  Psychiatric:        Cognition and Memory: Cognition is impaired. Memory is impaired.     Vital Signs: BP (!) 106/59   Pulse 66   Temp (!) 97.4 F  (36.3 C) (Axillary)   Resp (!) 24   Ht '6\' 1"'  (1.854 m)   Wt 91.9 kg   SpO2 99%   BMI 26.73 kg/m  Pain Scale: 0-10   Pain Score: 0-No pain   SpO2: SpO2: 99 % O2 Device:SpO2: 99 % O2 Flow Rate: .   IO: Intake/output summary:   Intake/Output Summary (Last 24 hours) at 01/04/2021 1702 Last data filed at 01/04/2021 1600 Gross per 24 hour  Intake 950 ml  Output 1800 ml  Net -850 ml    LBM:   Baseline Weight: Weight: 91.9 kg Most recent weight: Weight: 91.9 kg     Palliative Assessment/Data: PPS 10%     Time In: 1455 Time Out: 1607 Time Total: 72 minutes  Greater than 50%  of this time was spent counseling and coordinating care related to the above assessment and plan.  Signed by: Lin Landsman, NP   Please contact Palliative Medicine Team phone at [phone] for questions and concerns.  For individual provider: See Shea Evans

## 2021-01-04 NOTE — ED Notes (Addendum)
Patient coughing up white/cream frothy sputum. Provider aware and at bedside. Specimen collected and at bedside. Patient O2 sats also in the 80s on 2L [place]. Patient bumped up to 5L [place]. After coughing/suctioning patient up to 96%

## 2021-01-04 NOTE — Progress Notes (Signed)
Pt continues tobe medically unstable, unable to get OOB due to his low BP and co-morbidities of this stay.  Pt is going to IC bed and will reattempt the evaluation when more stable to do so.  01/04/21 1100  PT Visit Information  Reason Eval/Treat Not Completed Medical issues which prohibited therapy   Samul Dada, PT MS Acute Rehab Dept. Number: [facility] [id number] and [facility] [phone]

## 2021-01-04 NOTE — Progress Notes (Signed)
Spoke with Nehemiah Settle CCM who accepted patient to their service due declining condition and for closer monitoring.  FPTS greatly appreciates recommendations and will be happy to resume care of Mr Tsuchiya when he is stable to transfer to the floor.  Dana Allan, MD Family Medicine Residency

## 2021-01-04 NOTE — Progress Notes (Signed)
FPTS Interim Progress Note  O: BP (!) 84/51   Pulse 88   Temp 99 F (37.2 C)   Resp (!) 25   Ht 6\' 1"  (1.854 m)   Wt 91.9 kg   SpO2 98%   BMI 26.73 kg/m    General: somnolent but easily arousable to voice  Cardiovascular: regular rate and rhythm  Respiratory: diffuse rales, wearing 4L [place]  Extremities: anasarca  Neuro: oriented to person, place not year     A/P: Acute Respiratory Failure  Pulmonary Edema  Patient continues to have copious frothy sputum from his mouth.MAP 57-59 in ED. More somnolent than previous. He has had 4u pRBCs. S/p 20 mg IV Lasix. Obtain ABG.  Will re-consult PCCM.   , DO 01/04/2021, 7:26 AM PGY-2 [facility] Family Medicine Service pager [phone]

## 2021-01-04 NOTE — Progress Notes (Signed)
NAME:  Mason CarpenJohn Hart MRN:  [id number] DOB:  Dec 18, 1955 LOS: 2 ADMISSION DATE:  01/02/2021    OVERNIGHT COVERAGE CRITICAL CARE PROGRESS NOTE  Brief History   CTSP re: hypotension and "need for vasopressors".  Also, family practice resident reported that the patient is "gurgling".  Also reported be hypoxic, requiring 10 LPM.  Of note, the patient is status post 4 units PRBC transfusion today.  No diuretics were administered.  Known history of diastolic dysfunction.  Also, the patient has a known history of hypothyroidism (noncompliant with thyroid supplement per patient report).  On clinical examination, resting comfortably.  Awake, alert and answers appropriately.  SPO2 94%.  Audibly gurgling indeed.  Cough productive of white frothy sputum.  SBP 90s, MAP 70+.  [age] yoM w/hx of ETOH abuse, HTN, hypothyroidism, and seizure disorder presenting from home with generalized weakness and AMS found down for unknown amount of time admitted to FPTS with altered hypotension, mental status, sepsis suspicious for pneumonia treating with CAP coverage, rhabdomyolysis, and acute on chronic anemia.  Since admit, s/p 3 units of PRBC with Hgb 5.9->7 without obvious source of blood loss and some improvement in blood pressure but remains marginal.  CTA abd/ pelvis pending.  PCCM asked to re-evaluate.    History of present illness   65 yo man with hx seizures, etoh abuse, found down, initially bradycardic, encephalopathic, weakness, unable to ambulate, recently worsening living conditions (insects- bedbugs/ roaches, not maintained), etoh use, unable to ambulate.   Noted to have swelling in R arm and L LE, chronic (?).   +cough.  IN ED started on tx for PNA, 2 u prbc transfusion for anemia.  Started fluids 1/2 ns at 15925ml/hr.  lr bolus charted ICU called due to hypothermia, persistent anemia despite 1 unit PRBC.   Past Medical/Surgical/Social/Family History   Past Medical History:  Diagnosis Date  . Alcohol abuse   .  Hypertension   . Hypothyroidism   . Noncompliance with medication regimen   . RBBB (right bundle branch block)   . Seizures (HCC)     Past Surgical History:  Procedure Laterality Date  . NO PAST SURGERIES      Social History   Tobacco Use  . Smoking status: Never Smoker  . Smokeless tobacco: Never Used  Substance Use Topics  . Alcohol use: Yes    Comment: 1-2 beers per day- 42 oz, occasional liquor    Family History  Problem Relation Age of Onset  . Heart disease Mother   . Other Father        unsure of medical history  . Cancer Neg Hx   . Diabetes Neg Hx   . Stroke Neg Hx      Significant Hospital Events   2/6 admit F PTS   Consults:  PCCM   Procedures:  N/A   Significant Diagnostic Tests:  2/7 CTH >> 1. No acute finding by CT. Age related atrophy and chronic small-vessel ischemic changes of the white matter. 2. Chronic inflammatory changes of the paranasal sinuses, more pronounced on the left than the right. Complete opacification of the sphenoid sinus left division.  2/6 US LLE >> limited exam, no evidence of DVT  2/7 CTA abd/ pelvis >>   Micro Data:   Results for orders placed or performed during the hospital encounter of 01/02/21  SARS CORONAVIRUS 2 (TAT 6-24 HRS) Nasopharyngeal Nasopharyngeal Swab     Status: None   Collection Time: 01/02/21  6:20 PM   Specimen: Nasopharyngeal  Swab  Result Value Ref Range Status   SARS Coronavirus 2 NEGATIVE NEGATIVE Final    Comment: (NOTE) SARS-CoV-2 target nucleic acids are NOT DETECTED.  The SARS-CoV-2 RNA is generally detectable in upper and lower respiratory specimens during the acute phase of infection. Negative results do not preclude SARS-CoV-2 infection, do not rule out co-infections with other pathogens, and should not be used as the sole basis for treatment or other patient management decisions. Negative results must be combined with clinical observations, patient history, and epidemiological  information. The expected result is Negative.  Fact Sheet for Patients: HairSlick.no  Fact Sheet for Healthcare Providers: quierodirigir.com  This test is not yet approved or cleared by the [place] FDA and  has been authorized for detection and/or diagnosis of SARS-CoV-2 by FDA under an Emergency Use Authorization (EUA). This EUA will remain  in effect (meaning this test can be used) for the duration of the COVID-19 declaration under Se ction 564(b)(1) of the Act, 21 U.S.C. section 360bbb-3(b)(1), unless the authorization is terminated or revoked sooner.  Performed at [facility], [address] [place], [place] [address]       Antimicrobials:  2/6 Ceftriaxone 2/6 azithromycin Unasyn 2/7   Objective   BP 101/61   Pulse 78   Temp (!) 96.8 F (36 C)   Resp (!) 26   Ht 6\' 1"  (1.854 m)   Wt 91.9 kg   SpO2 93%   BMI 26.73 kg/m     Filed Weights   01/02/21 1602  Weight: 91.9 kg    Intake/Output Summary (Last 24 hours) at 01/04/2021 0100 Last data filed at 01/03/2021 2109 Gross per 24 hour  Intake 1380 ml  Output -  Net 1380 ml       Examination: GENERAL: alert, normal mood, behavior, speech and thought processes, pleasant or well-developed. No acute distress. HEAD: normocephalic, atraumatic EYE: PERRLA, EOM intact, no scleral icterus, no pallor. THROAT/ORAL CAVITY: Poor dentition. No oral thrush. No exudate. Mucous membranes are moist. No tonsillar enlargement.  NECK: supple, no thyromegaly, no JVD, no lymphadenopathy. Trachea midline. CHEST/LUNG: symmetric in development and expansion. Good air entry.  Bilateral rales.  Scant rhonchi.   HEART: Regular S1 and S2 without murmur, rub or gallop. ABDOMEN: soft, nontender, nondistended. Normoactive bowel sounds. No rebound. No guarding. No hepatosplenomegaly. EXTREMITIES: Edema: Trace. No cyanosis. No clubbing. 2+ DP pulses LYMPHATIC: no  cervical/axillary/inguinal lymph nodes appreciated MUSCULOSKELETAL: No point tenderness.  No bulk atrophy. Joints: Normal inspection.  SKIN: Dry skin.  No rash/lesions. NEUROLOGIC: Doll's eyes intact. Corneal reflex intact. Spontaneous respirations intact. Cranial nerves II-XII are grossly symmetric and physiologic. Babinski absent. No sensory deficit. Motor: 5/5 @ RUE, 5/5 @ LUE, 5/5 @ RLL,  5/5 @ LLL.  DTR: 2+ @ R biceps, 2+ @ L biceps, 2+ @ R patellar,  2+ @ L patellar. No cerebellar signs. Gait was not assessed.   Resolved Hospital Problem list   N/A   ASSESSMENT/PLAN:  ASSESSMENT (included in the Hospital Problem List)  Principal Problem:   Abnormal chest x-ray Active Problems:   Hypothyroidism   Hypoxia   Symptomatic anemia   Altered mental status   Thrombocytopenia (HCC)   By systems: PULMONARY: Hypoxia/elevated A-a gradient  Interval worsening of chest x-ray, likely owing to volume overload.  Agree with ongoing empiric Unasyn therapy.  Furosemide 20 mg IV single dose.  Monitor urine output.  May need repeat dose.   CARDIOVASCULAR  Volume overload   Diastolic dysfunction  Hypotension, clinically insignificant.  Continue to monitor blood pressure.  No need for vasopressor administration at this time.  In fact, would probably benefit from diuresis.   RENAL: No acute issues.  Monitor urine output.   GASTROINTESTINAL: No acute issues  GI PROPHYLAXIS: Protonix   HEMATOLOGIC  Normocytic anemia, status post transfusion 4 units PRBC  Thrombocytopenia   DVT PROPHYLAXIS: Heparin   INFECTIOUS: No acute issues  Continue empiric Unasyn   ENDOCRINE  Hypothyroidism, nonadherent to prescribed therapy Check free T4.   NEUROLOGIC  Altered mental status appears to be improved.   PLAN/RECOMMENDATIONS   See above.  Transfer to ICU is not required at this time.  Strict I's and O's.    My assessment, plan of care, findings, medications, side effects,  etc. were discussed with: nurse and Dr. Rachael Darby [facility]).   Best practice:  Diet: Per primary team Pain/Anxiety/Delirium protocol (if indicated): Not indicated VAP protocol (if indicated): Not applicable DVT prophylaxis: Heparin GI prophylaxis: Protonix Glucose control: Not applicable Mobility/Activity: Bedrest   Code Status: Full Code  Family Communication:  No family at bedside Disposition: no change.  Transfer to ICU as not required at this time.   Labs   CBC: Recent Labs  Lab 01/02/21 1728 01/03/21 0224 01/03/21 0800 01/03/21 1653 01/03/21 2305  WBC 12.4* 8.0 10.8* 10.3  --   NEUTROABS 10.9* 6.0 8.5*  --   --   HGB 5.9* 6.1* 7.0* 6.6* 9.3*  HCT 20.9* 19.7* 22.5* 22.9* 29.8*  MCV 84.6 83.8 83.0 85.4  --   PLT 75* 69* 79* 74*  --     Basic Metabolic Panel: Recent Labs  Lab 01/03/21 0153 01/03/21 0337 01/03/21 0800 01/03/21 1233 01/03/21 1653  NA 151* 151* 151* 152* 149*  K 3.4* 2.9* 2.9* 3.1* 3.5  CL 120* 118* 119* 119* 118*  CO2 18* 22 22 26 22   GLUCOSE 92 88 61* 63* 82  BUN 25* 23 22 20 17   CREATININE 1.04 1.01 1.04 1.00 0.97  CALCIUM 8.3* 8.4* 8.1* 8.1* 7.6*  MG  --  2.3  --   --   --   PHOS  --  3.1  --   --   --    GFR: Estimated Creatinine Clearance: 84.7 mL/min (by C-G formula based on SCr of 0.97 mg/dL). Recent Labs  Lab 01/02/21 1728 01/02/21 1729 01/03/21 0224 01/03/21 0800 01/03/21 1653  WBC 12.4*  --  8.0 10.8* 10.3  LATICACIDVEN  --  1.8  --   --   --     Liver Function Tests: Recent Labs  Lab 01/02/21 1728 01/03/21 0337  AST 59* 40  ALT 28 24  ALKPHOS 59 63  BILITOT 0.7 0.6  PROT 6.1* 5.7*  ALBUMIN 2.0* 1.8*   No results for input(s): LIPASE, AMYLASE in the last 168 hours. Recent Labs  Lab 01/02/21 1729  AMMONIA 27    ABG    Component Value Date/Time   PHART 7.438 12/05/2018 0349   PCO2ART 30.1 (L) 12/05/2018 0349   PO2ART 65.2 (L) 12/05/2018 0349   HCO3 20.5 12/05/2018 0349   TCO2 14 (L) 10/20/2020  2125   ACIDBASEDEF 3.4 (H) 12/05/2018 0349   O2SAT 93.5 12/05/2018 0349     Coagulation Profile: Recent Labs  Lab 01/03/21 1233  INR 1.3*    Cardiac Enzymes: Recent Labs  Lab 01/02/21 1728 01/03/21 1233  CKTOTAL 824* 293    HbA1C: Hgb A1c MFr Bld  Date/Time Value Ref Range Status  04/13/2020 03:40 PM 4.8 4.8 - 5.6 % Final    Comment:             Prediabetes: 5.7 - 6.4          Diabetes: >6.4          Glycemic control for adults with diabetes: <7.0   12/09/2012 03:06 PM 4.5 (L) 4.6 - 6.5 % Final    Comment:    Glycemic Control Guidelines for People with Diabetes:Non Diabetic:  <6%Goal of Therapy: <7%Additional Action Suggested:  >8%     CBG: Recent Labs  Lab 01/02/21 1607  GLUCAP 105*     Allergies No Known Allergies   Current Medications  Current Facility-Administered Medications:  .  0.9 %  sodium chloride infusion (Manually program via Guardrails IV Fluids), , Intravenous, Once, Mirian Mo, MD, Held at 01/03/21 1511 .  acetaminophen (TYLENOL) tablet 650 mg, 650 mg, Oral, Q6H PRN **OR** acetaminophen (TYLENOL) suppository 650 mg, 650 mg, Rectal, Q6H PRN, Ganta, Anupa, DO .  Ampicillin-Sulbactam (UNASYN) 3 g in sodium chloride 0.9 % 100 mL IVPB, 3 g, Intravenous, Q6H, Nestor Ramp, MD, Stopped at 01/04/21 [phone] .  dextrose 5 % and 0.9% NaCl 1,000 mL with potassium chloride 30 mEq/L Pediatric IV infusion, , Intravenous, Continuous, Arsenio Loader Ananias Pilgrim, MD, Stopped at 01/04/21 [phone] .  diphenhydrAMINE-zinc acetate (BENADRYL) 2-0.1 % cream, , Topical, TID PRN, Ganta, Anupa, DO .  folic acid (FOLVITE) tablet 1 mg, 1 mg, Oral, Daily, Ganta, Anupa, DO .  furosemide (LASIX) injection 20 mg, 20 mg, Intravenous, Once, Marcelle Smiling, MD .  lactated ringers bolus 2,000 mL, 2,000 mL, Intravenous, Once, Gleason, Darcella Gasman, PA-C, Held at 01/04/21 0023 .  levETIRAcetam (KEPPRA) IVPB 1000 mg/100 mL premix, 1,000 mg, Intravenous, Q12H, Wells, Ashleigh, MD .  LORazepam (ATIVAN)  tablet 1-4 mg, 1-4 mg, Oral, Q1H PRN **OR** LORazepam (ATIVAN) injection 1-4 mg, 1-4 mg, Intravenous, Q1H PRN, Ganta, Anupa, DO .  multivitamin with minerals tablet 1 tablet, 1 tablet, Oral, Daily, Ganta, Anupa, DO .  thiamine tablet 100 mg, 100 mg, Oral, Daily **OR** thiamine (B-1) injection 100 mg, 100 mg, Intravenous, Daily, Ganta, Anupa, DO, 100 mg at 01/03/21 1227  Current Outpatient Medications:  .  ferrous sulfate 325 (65 FE) MG tablet, Take 1 tablet (325 mg total) by mouth daily., Disp: 30 tablet, Rfl: 3 .  levETIRAcetam (KEPPRA) 1000 MG tablet, Take 1 tablet (1,000 mg total) by mouth 2 (two) times daily. Last fill! Must keep appointment., Disp: 180 tablet, Rfl: 4   Home Medications  Prior to Admission medications   Medication Sig Start Date End Date Taking? Authorizing Provider  ferrous sulfate 325 (65 FE) MG tablet Take 1 tablet (325 mg total) by mouth daily. 10/24/20 10/24/21 Yes Dorcas Carrow, MD  levETIRAcetam (KEPPRA) 1000 MG tablet Take 1 tablet (1,000 mg total) by mouth 2 (two) times daily. Last fill! Must keep appointment. 04/29/20 07/23/21 Yes Levert Feinstein, MD      Critical care time: 30 minutes.  The treatment and management of the patient's condition was required based on the threat of imminent deterioration. This time reflects time spent by the physician evaluating, providing care and managing the critically ill patient's care. The time was spent at the immediate bedside (or on the same floor/unit and dedicated to this patient's care). Time involved in separately billable procedures is NOT included int he critical care time indicated above. Family meeting and update time may be included above if and only if  the patient is unable/incompetent to participate in clinical interview and/or decision making, and the discussion was necessary to determining treatment decisions.    Marcelle Smiling, MD Board Certified by the ABIM, Pulmonary Diseases & Critical Care Medicine

## 2021-01-04 NOTE — Progress Notes (Signed)
PCCM Interval Note  Called back by FPTS this am to re-evaluate patient as patient more lethargic, ongoing copious secretions, and marginal to low blood pressures.    Was seen overnight by PCCM ground team and at that time did not meet ICU criteria.  He was given lasix 20mg  and since BP has declined again.  UOP has had great (although not charted in ER).   Patient has no complaints, denies pain or SOB.  Currently on 4L [place]   See PCCM prior full note from this morning.    Vitals:   01/04/21 0245 01/04/21 0600  BP: 95/63 (!) 84/51  Pulse: 89 88  Resp: (!) 26 (!) 25  Temp: 98 F (36.7 C) 99 F (37.2 C)  SpO2: 97% 98%     General:  Chronically ill appearing elderly male sitting upright in bed HEENT: MM pink/moist, copious oral thick secretions/ drooling- he has a very weak cough, soft phonation, pupils 3/reactive, scleral edema  Neuro: Awake, oriented to name, follows commands, generalized weakness- barely able to move extremities to gravity  CV: rr, no murmur  PULM:  rr 22, non labored, clear anteriorly, otherwise diminished, faint bibasilar crackles GI: soft, bs active, foley- great UOP Extremities: warm/dry, BUE and LLE edema, with diffuse  skin excoriations    P:  - Will transfer to ICU to monitor closely, high risk of deterioration and risk for intubation with poor oral secretions.  At this time he is protecting his airway.  ABG is reassuring 7.405/ 38.6/ 93/ 24 - continue supplemental O2 for sat goal > 92, currently on 4L, no visible resp distress at this time.  He would not be a candidate for BiPAP given his oral secretions, mental status and generalized weakness for aspiration precautions.  - aspiration/ seizure precautions - no seizure activity thus far; may need to consider EEG given seizure hx - albumin x 1 now  - goal MAP > 65, may need to brief peripheral vasopressors vs midodrine, suspect this is more chronic c/w failure to thrive; consider PICC for limited IV access,  sCr remains normal  - H/H remains stable after yesterdays blood transfusions, 9.2 on ABG, correlates with H/H 9.4 on early am labs - Remains NPO, seen by SLP this am but evaluation deferred given his copious oral secretions.  Will order cortrak for nutrition per RD/ meds per RD and will start free water flushes  - Recheck lactic, cortisol (concern for AI given hypothermia, hypoglycemia, and hypokalemia with reassuring TSH), and trend PCT - monitor CBG closely, q 4hr and prn for suspected hypoglycemia  - follow cultures, continue unasyn for now - have asked UA and culture to be sent - strict I/Os - ongoing neuro exams  - PMT consulted for GOC/ failure to thrive   Called and discussed with Dr. 03/04/21 with FPTS.    Called and updated Clent Ridges, who is his step-son but also his HPOA.  Patient is married but wife suffered a stroke in 2016 and [place] cares for her at home.  Patient lives with a room mate who helps care for him but gets him alcohol as well.  [place] tells me that previous conversations, patient has wanted everything done.  I did start discussions on my concerns of failure to thrive, ongoing ETOH abuse, his other comorbid conditions, and ? Non compliance, and noted previous admit in November for fall/ similar admission and that PMT has been consulted.    CCT 45 mins   December, ACNP [facility] 01/04/2021, 9:14 AM

## 2021-01-04 NOTE — ED Notes (Signed)
Per provider. Okay for patient soft Bps (90s systolic) as long as patient map is 65 or greater.

## 2021-01-04 NOTE — ED Notes (Signed)
Family medicine providers at bedside  

## 2021-01-04 NOTE — Progress Notes (Signed)
FPTS Interim Progress Note  S: Notified by nurse regarding patient gurgling and sounding very wet. Went to bedside to assess patient along with Dr. Rachael Darby. On initial presentation, patient coughing up white, foamy material. Nurse at bedside.  O: BP 101/61   Pulse 84   Temp (!) 96.3 F (35.7 C)   Resp (!) 25   Ht 6\' 1"  (1.854 m)   Wt 91.9 kg   SpO2 93%   BMI 26.73 kg/m   General: Patient laying slightly upright in bed, in no acute distress, foaming at the mouth. CV: unable to auscultate due to audible lung sounds but in normal sinus rhythm per monitor  Resp: diffuse crackles noted throughout all lung fields anteriorly, breathing on 2L O2 initially until desatted to 83 and increased to 10 L O2 Ext: anasarca, more prominently along left LE, 2+ edema noted bilaterally, pedal edema present bilaterally Neuro: AOx2, answers questions appropriately  A/P: -decreased fluids to 75 mL/hr D5 NS, unable to give lasix given concern for hemodynamic instability  -CXR notable for potential pleural effusion as a possible complication of pneumonia -will contact CCM given concern for sudden desaturation,patient would most likely benefit from pressors   , DO 01/04/2021, 12:19 AM PGY-1, [facility] pager [phone]

## 2021-01-04 NOTE — Progress Notes (Signed)
SLP Cancellation Note  Patient Details Name: April Carlyon MRN: [id number] DOB: [birth year]   Cancelled treatment:       Reason Eval/Treat Not Completed: Patient not medically ready. Pt in ED, NPO with orders for swallow eval. Per notes pt not managing secretions well. Will f/u when medically appropriate.    Alesha Jaffee, Riley Nearing 01/04/2021, 8:14 AM

## 2021-01-04 NOTE — Progress Notes (Signed)
CBG 51, pt w/ lethagy with CBG >70 earlier, difficult to surmise if pt syptomatic of hypoglycemia w/ no other S/S; treated w/ 12.5 of D50 and D10 maintenance started. F/U CBG 85 after 45 min.

## 2021-01-04 NOTE — Progress Notes (Signed)
°  Echocardiogram 2D Echocardiogram has been performed.  Marlyn Tondreau G Ashleen Demma 01/04/2021, 9:11 AM

## 2021-01-04 NOTE — Progress Notes (Addendum)
Initial Nutrition Assessment  DOCUMENTATION CODES:   Severe malnutrition in context of social or environmental circumstances  INTERVENTION:   When Cortrak placed, initiate tube feeding: Osmolite 1.5 at 20 ml/h, increase by 10 ml every 8 hours to goal rate of 60 ml/h (1440 ml per day) Prosource TF 45 ml TID  Provides 2280 kcal, 123 gm protein, 1097 ml free water daily.  Monitor magnesium, potassium, and phosphorus x 3 days, MD to replete as needed, as pt is at risk for refeeding syndrome given severe PCM with unknown last PO intake.   NUTRITION DIAGNOSIS:   Severe Malnutrition related to social / environmental circumstances as evidenced by mild fat depletion,moderate fat depletion,mild muscle depletion,moderate muscle depletion.  GOAL:   Patient will meet greater than or equal to 90% of their needs  MONITOR:   Diet advancement,PO intake,Labs,Skin  REASON FOR ASSESSMENT:   Consult Assessment of nutrition requirement/status  ASSESSMENT:   66 yo male admitted after being found down by family for an unknown duration of time. PMH includes seizures, alcohol abuse, HTN, hypothyroidism, RBBB.   Patient is being treated for PNA. Chest x-ray worse this morning r/t volume overload.   Spoke with patient at bedside. He was unable to provide any reliable nutrition hx. He says his usual weight is 137 lbs. Usual weights reviewed. Weight has fluctuated between 88.3 kg and 103.9 kg over the past 9 months.  Patient with mild-moderate depletion of muscle and subcutaneous fat mass. Difficult to assess some areas due to edema. Meets criteria for moderate malnutrition.   Patient has been NPO since admission. Currently unsafe to take POs per discussion with RN due to decreased alertness.  SLP unable to perform swallow evaluation today due to patient gurgling, not managing secretions well.  Cortrak has been ordered, service available tomorrow.   Palliative care team has been consulted.    Labs reviewed. Sodium 153 CBG: 71-52-85 Required IV dextrose for low glucose earlier today.  Medications reviewed and include folic acid, MVI with minerals, thiamine, Keppra, Levophed.   NUTRITION - FOCUSED PHYSICAL EXAM:  Flowsheet Row Most Recent Value  Orbital Region Moderate depletion  Upper Arm Region Unable to assess  Thoracic and Lumbar Region Moderate depletion  Buccal Region Mild depletion  Temple Region Mild depletion  Clavicle Bone Region Moderate depletion  Clavicle and Acromion Bone Region Moderate depletion  Scapular Bone Region Moderate depletion  Dorsal Hand Unable to assess  Patellar Region Unable to assess  Anterior Thigh Region Unable to assess  Posterior Calf Region Unable to assess  Edema (RD Assessment) Severe  Hair Reviewed  Eyes Reviewed  Mouth Reviewed  Skin Reviewed  Nails Reviewed       Diet Order:   Diet Order            Diet NPO time specified  Diet effective now                 EDUCATION NEEDS:   Not appropriate for education at this time  Skin:  Skin Assessment:  (RN assessment not yet completed)  Last BM:  no BM documented  Height:   Ht Readings from Last 1 Encounters:  01/02/21 6\' 1"  (1.854 m)    Weight:   Wt Readings from Last 1 Encounters:  01/02/21 91.9 kg    Ideal Body Weight:  83.6 kg  BMI:  Body mass index is 26.73 kg/m.  Estimated Nutritional Needs:   Kcal:  2100-2400  Protein:  120-140 gm  Fluid:  >/=  2.1 L    Gabriel Rainwater, RD, LDN, CNSC Please refer to [facility] for contact information.

## 2021-01-04 NOTE — Progress Notes (Addendum)
Spoke with PCCM regarding pt's hypoxia and pulmonary edema. Pt hypoxic to 70s-80% requiring 10L. PCCM to re-evaluate pt and determine need for pressors.   Katha Cabal, DO PGY-2, [facility] 01/04/2021 12:33 AM

## 2021-01-04 NOTE — Progress Notes (Signed)
NAME:  Mason Hart, MRN:  [id number], DOB:  08-06-1956, LOS: 2 ADMISSION DATE:  01/02/2021, CONSULTATION DATE:  01/03/21 REFERRING MD:  Rachael Darby, CHIEF COMPLAINT: weakness  Brief History:  42 yoM w/hx of ETOH abuse, HTN, hypothyroidism, and seizure disorder presenting from home with generalized weakness and AMS found down for unknown amount of time admitted to FPTS with altered hypotension, mental status, sepsis suspicious for pneumonia treating with CAP coverage, rhabdomyolysis, and acute on chronic anemia.  Since admit, s/p 3 units of PRBC with Hgb 5.9->7 without obvious source of blood loss and some improvement in blood pressure but remains marginal.  CTA abd/ pelvis pending.  PCCM asked to re-evaluate.    History of Present Illness:  65 yo man with hx seizures, etoh abuse, found down, initially bradycardic, encephalopathic, weakness, unable to ambulate, recently worsening living conditions (insects- bedbugs/ roaches, not maintained), etoh use, unable to ambulate.   Noted to have swelling in R arm and L LE, chronic (?).   +cough.  IN ED started on tx for PNA, 2 u prbc transfusion for anemia.  Started fluids 1/2 ns at 197ml/hr.  lr bolus charted ICU called due to hypothermia, persistent anemia despite 1 unit PRBC.   Past Medical History:  ETOH use HTN Hypothyroidism RBBB Seizure hx. ? Non compliance with keppra  Anemia  Significant Hospital Events:  2/6 admit FPTS  Consults:  PCCM   Procedures:    Significant Diagnostic Tests:  2/7 CTH >> 1. No acute finding by CT. Age related atrophy and chronic small-vessel ischemic changes of the white matter. 2. Chronic inflammatory changes of the paranasal sinuses, more pronounced on the left than the right. Complete opacification of the sphenoid sinus left division.  2/6 [place] LLE >> limited exam, no evidence of DVT  2/7 CTA abd/ pelvis >>  Micro Data:  2/6 SARS >>negative  2/7 BCx2 >> 2/7 UC >>  Antimicrobials:  2/6  Ceftriaxone 2/6 azithromycin Unasyn 2/7 CTX 2/8  Interim History / Subjective:   PT on 5L satting 100, lungs clear, BPS borderline, good UOP, lactic acid WNL. Mild Cr bump, Bps borderline, brought to ICU, low dose NE started peripherally.  Objective   Blood pressure (!) 106/59, pulse 61, temperature (!) 97.4 F (36.3 C), temperature source Axillary, resp. rate 20, height 6\' 1"  (1.854 m), weight 91.9 kg, SpO2 100 %.        Intake/Output Summary (Last 24 hours) at 01/04/2021 1851 Last data filed at 01/04/2021 1800 Gross per 24 hour  Intake 1167.25 ml  Output 1850 ml  Net -682.75 ml   Filed Weights   01/02/21 1602  Weight: 91.9 kg   Examination:   General:  Chronically ill-appearing M, awake, sleepy HEENT: MM pink/moist, oral secretions moderated Neuro: arousable, answers questions appropriately, no obvious focal deficits CV: s1s2 rrr, no m/r/g PULM:  Thick upper airway sounds throughout, on RA, no respiratory distress  GI: soft, bsx4 active  Extremities: warm/dry,1+ edema  Skin: thickened flaking dermis on all extremities, 3+ LLE 1+ RLE  Resolved Hospital Problem list     Assessment & Plan:   Shock: Hypovolemia and septic due to pneumonia. Mild AKI 2/8. TSH WNL P: - MAP > 65, NE titrating -Abc for CAP - follow cultures  Encephalopathy  Suspect this is secondary to ETOH abuse and hypothermia, CTH without acute findings. Also with hypoglycemia. -responsive on exam -Mild improvement with better sugars P: -Monitor mental status  Normocytic Anemia FOBT+ - s/p 3 units of PRBC with  Hgb 5.9-> 7 P: -chronic schistocytes and thrombocytopenia, likely secondary to chronic myelosuppression -anemia work-up sent by primary team - trend H/H   Hypernatremia  Hypokalemia Due to poor solute intake P: - replace as needed - Free water D10 - Cortrak 2/9  Hx seizures Loaded with Keppra in the ED P: - seizure precautions  - keppra for AED  ETOH abuse - CIWA -  thiamine/ folate   Labs   CBC: Recent Labs  Lab 01/02/21 1728 01/03/21 0224 01/03/21 0800 01/03/21 1653 01/03/21 2305 01/04/21 0155 01/04/21 0752 01/04/21 0820  WBC 12.4* 8.0 10.8* 10.3  --  12.7*  --  14.9*  NEUTROABS 10.9* 6.0 8.5*  --   --   --   --   --   HGB 5.9* 6.1* 7.0* 6.6* 9.3* 9.4* 9.2* 9.6*  HCT 20.9* 19.7* 22.5* 22.9* 29.8* 31.3* 27.0* 31.5*  MCV 84.6 83.8 83.0 85.4  --  85.5  --  85.4  PLT 75* 69* 79* 74*  --  79*  --  85*    Basic Metabolic Panel: Recent Labs  Lab 01/03/21 0337 01/03/21 0800 01/03/21 1233 01/03/21 1653 01/04/21 0155 01/04/21 0752 01/04/21 0820  NA 151* 151* 152* 149* 152* 156* 153*  K 2.9* 2.9* 3.1* 3.5 3.9 3.3* 3.6  CL 118* 119* 119* 118* 123*  --  122*  CO2 22 22 26 22  21*  --  21*  GLUCOSE 88 61* 63* 82 67*  --  71  BUN 23 22 20 17 15   --  14  CREATININE 1.01 1.04 1.00 0.97 1.02  --  1.25*  CALCIUM 8.4* 8.1* 8.1* 7.6* 7.2*  --  7.5*  MG 2.3  --   --   --   --   --   --   PHOS 3.1  --   --   --   --   --   --    GFR: Estimated Creatinine Clearance: 65.7 mL/min (A) (by C-G formula based on SCr of 1.25 mg/dL (H)). Recent Labs  Lab 01/02/21 1729 01/03/21 0224 01/03/21 0800 01/03/21 1653 01/04/21 0155 01/04/21 0818 01/04/21 0820 01/04/21 0843  PROCALCITON  --   --   --   --   --   --   --  0.23  WBC  --    < > 10.8* 10.3 12.7*  --  14.9*  --   LATICACIDVEN 1.8  --   --   --   --  1.1  --   --    < > = values in this interval not displayed.    Liver Function Tests: Recent Labs  Lab 01/02/21 1728 01/03/21 0337  AST 59* 40  ALT 28 24  ALKPHOS 59 63  BILITOT 0.7 0.6  PROT 6.1* 5.7*  ALBUMIN 2.0* 1.8*   No results for input(s): LIPASE, AMYLASE in the last 168 hours. Recent Labs  Lab 01/02/21 1729  AMMONIA 27    ABG    Component Value Date/Time   PHART 7.405 01/04/2021 0752   PCO2ART 38.6 01/04/2021 0752   PO2ART 93 01/04/2021 0752   HCO3 24.2 01/04/2021 0752   TCO2 25 01/04/2021 0752   ACIDBASEDEF 3.4 (H)  12/05/2018 0349   O2SAT 97.0 01/04/2021 0752     Coagulation Profile: Recent Labs  Lab 01/03/21 1233  INR 1.3*    Cardiac Enzymes: Recent Labs  Lab 01/02/21 1728 01/03/21 1233  CKTOTAL 824* 293    HbA1C: Hgb A1c MFr Bld  Date/Time  Value Ref Range Status  04/13/2020 03:40 PM 4.8 4.8 - 5.6 % Final    Comment:             Prediabetes: 5.7 - 6.4          Diabetes: >6.4          Glycemic control for adults with diabetes: <7.0   12/09/2012 03:06 PM 4.5 (L) 4.6 - 6.5 % Final    Comment:    Glycemic Control Guidelines for People with Diabetes:Non Diabetic:  <6%Goal of Therapy: <7%Additional Action Suggested:  >8%     CBG: Recent Labs  Lab 01/02/21 1607 01/04/21 0825 01/04/21 1245 01/04/21 1331 01/04/21 1619  GLUCAP 105* 71 52* 85 86    CRITICAL CARE Performed by: Lesia Sago Jamyiah Labella   Total critical care time: 30 minutes  Critical care time was exclusive of separately billable procedures and treating other patients.  Critical care was necessary to treat or prevent imminent or life-threatening deterioration.  Critical care was time spent personally by me on the following activities: development of treatment plan with patient and/or surrogate as well as nursing, discussions with consultants, evaluation of patient's response to treatment, examination of patient, obtaining history from patient or surrogate, ordering and performing treatments and interventions, ordering and review of laboratory studies, ordering and review of radiographic studies, pulse oximetry and re-evaluation of patient's condition.  Karren Burly, MD

## 2021-01-05 ENCOUNTER — Inpatient Hospital Stay (HOSPITAL_COMMUNITY): Payer: Medicare Other

## 2021-01-05 DIAGNOSIS — F10231 Alcohol dependence with withdrawal delirium: Secondary | ICD-10-CM | POA: Diagnosis not present

## 2021-01-05 DIAGNOSIS — E878 Other disorders of electrolyte and fluid balance, not elsewhere classified: Secondary | ICD-10-CM

## 2021-01-05 DIAGNOSIS — E43 Unspecified severe protein-calorie malnutrition: Secondary | ICD-10-CM

## 2021-01-05 DIAGNOSIS — R9389 Abnormal findings on diagnostic imaging of other specified body structures: Secondary | ICD-10-CM | POA: Diagnosis not present

## 2021-01-05 DIAGNOSIS — E870 Hyperosmolality and hypernatremia: Secondary | ICD-10-CM | POA: Diagnosis not present

## 2021-01-05 DIAGNOSIS — R531 Weakness: Secondary | ICD-10-CM | POA: Diagnosis not present

## 2021-01-05 DIAGNOSIS — E860 Dehydration: Secondary | ICD-10-CM

## 2021-01-05 DIAGNOSIS — R0902 Hypoxemia: Secondary | ICD-10-CM

## 2021-01-05 DIAGNOSIS — J9601 Acute respiratory failure with hypoxia: Secondary | ICD-10-CM | POA: Diagnosis not present

## 2021-01-05 DIAGNOSIS — E876 Hypokalemia: Secondary | ICD-10-CM

## 2021-01-05 DIAGNOSIS — R4182 Altered mental status, unspecified: Secondary | ICD-10-CM | POA: Diagnosis not present

## 2021-01-05 LAB — COMPREHENSIVE METABOLIC PANEL
ALT: 16 U/L (ref 0–44)
AST: 23 U/L (ref 15–41)
Albumin: 1.4 g/dL — ABNORMAL LOW (ref 3.5–5.0)
Alkaline Phosphatase: 42 U/L (ref 38–126)
Anion gap: 9 (ref 5–15)
BUN: 14 mg/dL (ref 8–23)
CO2: 21 mmol/L — ABNORMAL LOW (ref 22–32)
Calcium: 7.5 mg/dL — ABNORMAL LOW (ref 8.9–10.3)
Chloride: 124 mmol/L — ABNORMAL HIGH (ref 98–111)
Creatinine, Ser: 1.12 mg/dL (ref 0.61–1.24)
GFR, Estimated: >60 mL/min (ref >60)
Glucose, Bld: 94 mg/dL (ref 70–99)
Potassium: 3.4 mmol/L — ABNORMAL LOW (ref 3.5–5.1)
Sodium: 154 mmol/L — ABNORMAL HIGH (ref 135–145)
Total Bilirubin: 0.4 mg/dL (ref 0.3–1.2)
Total Protein: 4.7 g/dL — ABNORMAL LOW (ref 6.5–8.1)

## 2021-01-05 LAB — GLUCOSE, CAPILLARY
Glucose-Capillary: 76 mg/dL (ref 70–99)
Glucose-Capillary: 80 mg/dL (ref 70–99)
Glucose-Capillary: 81 mg/dL (ref 70–99)
Glucose-Capillary: 85 mg/dL (ref 70–99)
Glucose-Capillary: 86 mg/dL (ref 70–99)
Glucose-Capillary: 96 mg/dL (ref 70–99)

## 2021-01-05 LAB — CBC
HCT: 24.8 % — ABNORMAL LOW (ref 39.0–52.0)
Hemoglobin: 8 g/dL — ABNORMAL LOW (ref 13.0–17.0)
MCH: 27.4 pg (ref 26.0–34.0)
MCHC: 32.3 g/dL (ref 30.0–36.0)
MCV: 84.9 fL (ref 80.0–100.0)
RBC: 2.92 MIL/uL — ABNORMAL LOW (ref 4.22–5.81)
RDW: 21.5 % — ABNORMAL HIGH (ref 11.5–15.5)
WBC: 9.9 10*3/uL (ref 4.0–10.5)
nRBC: 0.7 % — ABNORMAL HIGH (ref 0.0–0.2)

## 2021-01-05 LAB — PHOSPHORUS: Phosphorus: 3.2 mg/dL (ref 2.5–4.6)

## 2021-01-05 LAB — MAGNESIUM: Magnesium: 2 mg/dL (ref 1.7–2.4)

## 2021-01-05 LAB — PROCALCITONIN: Procalcitonin: 0.18 ng/mL

## 2021-01-05 MED ORDER — POTASSIUM CHLORIDE 10 MEQ/100ML IV SOLN
10.0000 meq | INTRAVENOUS | Status: AC
  Administered 2021-01-05 (×4): 10 meq via INTRAVENOUS
  Filled 2021-01-05 (×3): qty 100

## 2021-01-05 MED ORDER — ENOXAPARIN SODIUM 40 MG/0.4ML ~~LOC~~ SOLN
40.0000 mg | SUBCUTANEOUS | Status: DC
  Administered 2021-01-06 – 2021-01-13 (×8): 40 mg via SUBCUTANEOUS
  Filled 2021-01-05 (×8): qty 0.4

## 2021-01-05 MED ORDER — ENSURE ENLIVE PO LIQD
237.0000 mL | Freq: Three times a day (TID) | ORAL | Status: DC
  Administered 2021-01-05 – 2021-01-13 (×23): 237 mL via ORAL

## 2021-01-05 NOTE — Evaluation (Signed)
Clinical/Bedside Swallow Evaluation Patient Details  Name: Mason Hart MRN: [id number] Date of Birth: 1956-01-17  Today's Date: 01/05/2021 Time: SLP Start Time (ACUTE ONLY): 9147 SLP Stop Time (ACUTE ONLY): 1001 SLP Time Calculation (min) (ACUTE ONLY): 19 min  Past Medical History:  Past Medical History:  Diagnosis Date  . Alcohol abuse   . Hypertension   . Hypothyroidism   . Noncompliance with medication regimen   . RBBB (right bundle branch block)   . Seizures (HCC)    Past Surgical History:  Past Surgical History:  Procedure Laterality Date  . NO PAST SURGERIES     HPI:  Pt is a 65 y.o. male with PMH is significant for alcohol use disorder, hypertension, hypothyroidism and seizure disorder. He presented with extremity weakness and altered mental status; admitted with hypotension, rhabdomyolysis, acute on chronic anemia, and sepsis suspicious for pneumonia treating with CAP coverage. Pt loaded with Keppra in ED.  CXR 2/9: No substantial change in the right greater than left basilar airspace disease with small left pleural effusion. Cortrak scheduled to be placed 2/9. MBS 03/20/19: moderate oropharyngeal dysphagia; Oral phase with decreased oral control due to weakess with premature spillage of boluses into pharynx/larynx.  Also laryngeal penetration of thin and nectar apparent due to decreased epiglottic deflection and timing of laryngeal closure.  Mild vallecular residuals present without pt awareness.  A dysphagia 1 diet with thin liquids was recommended at that time with subsequent advancement to regular thin by 03/24/19.   Assessment / Plan / Recommendation Clinical Impression  Pt was seen for bedside swallow evaluation and he denied a history of dysphagia. Oral mechanism exam was [facility] and dentition was reduced with multiple maxillary molars missing. He exhibited a delayed cough with one large bolus of puree, but tolerated the rest of the 4oz container without overt s/sx of aspiration.  Pt tolerated all other solids and liquids without signs or symptoms of oropharyngeal dysphagia even when challenged with larger boluses and with consecutive swallows. A regular texture diet with thin liquids is recommended at this time and SLP will follow to ensure diet tolerance. SLP Visit Diagnosis: Dysphagia, unspecified (R13.10)    Aspiration Risk  Mild aspiration risk    Diet Recommendation Thin liquid;Regular   Liquid Administration via: Cup;Straw Medication Administration: Whole meds with puree Supervision: Staff to assist with self feeding Compensations: Slow rate;Small sips/bites Postural Changes: Remain upright for at least 30 minutes after po intake    Other  Recommendations Oral Care Recommendations: Oral care BID   Follow up Recommendations  (TBD)      Frequency and Duration min 1 x/week  1 week       Prognosis Prognosis for Safe Diet Advancement: Good Barriers to Reach Goals: Cognitive deficits      Swallow Study   General Date of Onset: 01/04/21 HPI: Pt is a 65 y.o. male with PMH is significant for alcohol use disorder, hypertension, hypothyroidism and seizure disorder. He presented with extremity weakness and altered mental status; admitted with hypotension, rhabdomyolysis, acute on chronic anemia, and sepsis suspicious for pneumonia treating with CAP coverage. Pt loaded with Keppra in ED.  CXR 2/9: No substantial change in the right greater than left basilar airspace disease with small left pleural effusion. Cortrak scheduled to be placed 2/9. MBS 03/20/19: moderate oropharyngeal dysphagia; Oral phase with decreased oral control due to weakess with premature spillage of boluses into pharynx/larynx.  Also laryngeal penetration of thin and nectar apparent due to decreased epiglottic deflection and timing of  laryngeal closure.  Mild vallecular residuals present without pt awareness.  A dysphagia 1 diet with thin liquids was recommended at that time with subsequent  advancement to regular thin by 03/24/19. Type of Study: Bedside Swallow Evaluation Previous Swallow Assessment: See HPI Diet Prior to this Study: NPO Temperature Spikes Noted: No Respiratory Status: Room air History of Recent Intubation: No Behavior/Cognition: Alert;Cooperative;Pleasant mood Oral Cavity Assessment: Within Functional Limits Oral Care Completed by SLP: No Oral Cavity - Dentition: Poor condition;Missing dentition Vision: Functional for self-feeding Self-Feeding Abilities: Needs assist Patient Positioning: Upright in bed;Postural control adequate for testing Baseline Vocal Quality: Low vocal intensity Volitional Cough: Weak;Congested Volitional Swallow: Able to elicit    Oral/Motor/Sensory Function Overall Oral Motor/Sensory Function: Within functional limits   Ice Chips Ice chips: Within functional limits Presentation: Spoon   Thin Liquid Thin Liquid: Within functional limits Presentation: Cup;Straw    Nectar Thick Nectar Thick Liquid: Not tested   Honey Thick Honey Thick Liquid: Not tested   Puree Puree: Impaired Presentation: Spoon Pharyngeal Phase Impairments: Cough - Delayed (with 1 large bolus)   Solid     Solid: Within functional limits     Laurey Salser I. Vear Clock, MS, CCC-SLP Acute Rehabilitation Services Office number [phone] Pager [phone]  Scheryl Marten 01/05/2021,10:08 AM

## 2021-01-05 NOTE — Progress Notes (Signed)
NAME:  Mason Hart, MRN:  [id number], DOB:  [birth year], LOS: 3 ADMISSION DATE:  01/02/2021, CONSULTATION DATE:  01/03/21 REFERRING MD:  Rachael Darby, CHIEF COMPLAINT: weakness  Brief History:  1 yoM w/hx of ETOH abuse, HTN, hypothyroidism, and seizure disorder presenting from home with generalized weakness and AMS found down for unknown amount of time admitted to FPTS with altered hypotension, mental status, sepsis suspicious for pneumonia treating with CAP coverage, rhabdomyolysis, and acute on chronic anemia.  Since admit, s/p 3 units of PRBC with Hgb 5.9->7 without obvious source of blood loss and some improvement in blood pressure but remains marginal.  CTA abd/ pelvis pending.  PCCM asked to re-evaluate.    History of Present Illness:  65 yo man with hx seizures, etoh abuse, found down, initially bradycardic, encephalopathic, weakness, unable to ambulate, recently worsening living conditions (insects- bedbugs/ roaches, not maintained), etoh use, unable to ambulate.   Noted to have swelling in R arm and L LE, chronic (?).   +cough.  IN ED started on tx for PNA, 2 u prbc transfusion for anemia.  Started fluids 1/2 ns at 184ml/hr.  lr bolus charted ICU called due to hypothermia, persistent anemia despite 1 unit PRBC.   Past Medical History:  ETOH use HTN Hypothyroidism RBBB Seizure hx. ? Non compliance with keppra  Anemia  Significant Hospital Events:  2/6 admit FPTS  Consults:  PCCM   Procedures:    Significant Diagnostic Tests:  2/7 CTH >> 1. No acute finding by CT. Age related atrophy and chronic small-vessel ischemic changes of the white matter. 2. Chronic inflammatory changes of the paranasal sinuses, more pronounced on the left than the right. Complete opacification of the sphenoid sinus left division.  2/6 [place] LLE >> limited exam, no evidence of DVT  2/7 CTA abd/ pelvis >>  Micro Data:  2/6 SARS >>negative  2/7 BCx2 >> 2/7 UC >>  Antimicrobials:  2/6  Ceftriaxone 2/6 azithromycin Unasyn 2/7 CTX 2/8  Interim History / Subjective:   No overnight issues.  He is more alert this morning.  Asking for food.  Afebrile overnight.  Objective   Blood pressure 114/64, pulse 63, temperature (!) 94.3 F (34.6 C), temperature source Axillary, resp. rate 16, height 6\' 1"  (1.854 m), weight 91.9 kg, SpO2 100 %.        Intake/Output Summary (Last 24 hours) at 01/05/2021 1227 Last data filed at 01/05/2021 0800 Gross per 24 hour  Intake 1499.07 ml  Output 655 ml  Net 844.07 ml   Filed Weights   01/02/21 1602  Weight: 91.9 kg   Examination:  Thin, chronically ill-appearing Coarse bilateral rhonchi Weak cough Thin extremities Follows commands, diffusely weak  Labs reviewed: Notable for hypernatremia, hypokalemia, hyperchloremia, hypoglycemia Low procalcitonin  Resolved Hospital Problem list     Assessment & Plan:   Shock: Hypovolemia and septic due to pneumonia. Mild AKI 2/8. TSH WNL P: - MAP > 65, NE titrating -Abc for CAP - follow cultures - sepsis ruled out. procalcitonin is low. Suspect aspiration rather than an infectious pneumonia. Will d/c abx  Encephalopathy  Suspect this is secondary to ETOH abuse and hypothermia, CTH without acute findings. Also with hypoglycemia. - improved with IVF resuscitation and hypoglycemia treatment P: -Monitor mental status  Normocytic Anemia - s/p 3 units of PRBC with Hgb 5.9-> 7 P: -chronic schistocytes and thrombocytopenia, likely secondary to chronic myelosuppression -anemia work-up pending - trend H/H   Hypernatremia  Hypoglycemia Hypokalemia Hyperchloremia Due to poor solute intake  and dehydration P: - Free water with D10. Will titrate down as tolerated to blood sugars as he is able to keep down PO intake - will hold off on cortrak for now. Liberalize fluid intake - electrolytes replaced  Hx seizures Loaded with Keppra in the ED P: - seizure precautions  - keppra for  AED  ETOH abuse - CIWA - thiamine/ folate   This patient is being transferred out of ICU today.  Will sign out to [facility] medicine to assume care 2/10. I spent 35 minutes with greater than 50% of the time including face to face time, coordination of care including order reconciliation, patient transfer coordination, provider handoff.      Labs   CBC: Recent Labs  Lab 01/02/21 1728 01/03/21 0224 01/03/21 0800 01/03/21 1653 01/03/21 2305 01/04/21 0155 01/04/21 0752 01/04/21 0820 01/05/21 0436  WBC 12.4* 8.0 10.8* 10.3  --  12.7*  --  14.9* 9.9  NEUTROABS 10.9* 6.0 8.5*  --   --   --   --   --   --   HGB 5.9* 6.1* 7.0* 6.6* 9.3* 9.4* 9.2* 9.6* 8.0*  HCT 20.9* 19.7* 22.5* 22.9* 29.8* 31.3* 27.0* 31.5* 24.8*  MCV 84.6 83.8 83.0 85.4  --  85.5  --  85.4 84.9  PLT 75* 69* 79* 74*  --  79*  --  85* QNS    Basic Metabolic Panel: Recent Labs  Lab 01/03/21 0337 01/03/21 0800 01/03/21 1233 01/03/21 1653 01/04/21 0155 01/04/21 0752 01/04/21 0820 01/05/21 0436  NA 151*   < > 152* 149* 152* 156* 153* 154*  K 2.9*   < > 3.1* 3.5 3.9 3.3* 3.6 3.4*  CL 118*   < > 119* 118* 123*  --  122* 124*  CO2 22   < > 26 22 21*  --  21* 21*  GLUCOSE 88   < > 63* 82 67*  --  71 94  BUN 23   < > 20 17 15   --  14 14  CREATININE 1.01   < > 1.00 0.97 1.02  --  1.25* 1.12  CALCIUM 8.4*   < > 8.1* 7.6* 7.2*  --  7.5* 7.5*  MG 2.3  --   --   --   --   --   --  2.0  PHOS 3.1  --   --   --   --   --   --  3.2   < > = values in this interval not displayed.   GFR: Estimated Creatinine Clearance: 73.3 mL/min (by C-G formula based on SCr of 1.12 mg/dL). Recent Labs  Lab 01/02/21 1729 01/03/21 0224 01/03/21 1653 01/04/21 0155 01/04/21 0818 01/04/21 0820 01/04/21 0843 01/05/21 0436 01/05/21 0619  PROCALCITON  --   --   --   --   --   --  0.23  --  0.18  WBC  --    < > 10.3 12.7*  --  14.9*  --  9.9  --   LATICACIDVEN 1.8  --   --   --  1.1  --   --   --   --    < > = values in this interval  not displayed.    Liver Function Tests: Recent Labs  Lab 01/02/21 1728 01/03/21 0337 01/05/21 0436  AST 59* 40 23  ALT 28 24 16   ALKPHOS 59 63 42  BILITOT 0.7 0.6 0.4  PROT 6.1* 5.7* 4.7*  ALBUMIN 2.0*  1.8* 1.4*   No results for input(s): LIPASE, AMYLASE in the last 168 hours. Recent Labs  Lab 01/02/21 1729  AMMONIA 27    ABG    Component Value Date/Time   PHART 7.405 01/04/2021 0752   PCO2ART 38.6 01/04/2021 0752   PO2ART 93 01/04/2021 0752   HCO3 24.2 01/04/2021 0752   TCO2 25 01/04/2021 0752   ACIDBASEDEF 3.4 (H) 12/05/2018 0349   O2SAT 97.0 01/04/2021 0752     Coagulation Profile: Recent Labs  Lab 01/03/21 1233  INR 1.3*    Cardiac Enzymes: Recent Labs  Lab 01/02/21 1728 01/03/21 1233  CKTOTAL 824* 293    HbA1C: Hgb A1c MFr Bld  Date/Time Value Ref Range Status  04/13/2020 03:40 PM 4.8 4.8 - 5.6 % Final    Comment:             Prediabetes: 5.7 - 6.4          Diabetes: >6.4          Glycemic control for adults with diabetes: <7.0   12/09/2012 03:06 PM 4.5 (L) 4.6 - 6.5 % Final    Comment:    Glycemic Control Guidelines for People with Diabetes:Non Diabetic:  <6%Goal of Therapy: <7%Additional Action Suggested:  >8%     CBG: Recent Labs  Lab 01/04/21 1945 01/04/21 2348 01/05/21 0346 01/05/21 0717 01/05/21 1156  GLUCAP 82 93 80 86 81

## 2021-01-05 NOTE — Progress Notes (Signed)
[facility] ADULT ICU REPLACEMENT PROTOCOL   The patient does apply for the [facility] Adult ICU Electrolyte Replacment Protocol based on the criteria listed below:   1. Is GFR >/= 30 ml/min? Yes.    Patient's GFR today is >60 2. Is SCr </= 2? Yes.   Patient's SCr is 1.12 ml/kg/hr 3. Did SCr increase >/= 0.5 in 24 hours? No. 4. Abnormal electrolyte(s): k 3.4 5. Ordered repletion with: protocol 6. If a panic level lab has been reported, has the CCM MD in charge been notified? No..   Physician:    Markus Daft A 01/05/2021 6:50 AM

## 2021-01-05 NOTE — Evaluation (Signed)
Occupational Therapy Evaluation Patient Details Name: Mason Hart MRN: [id number] DOB: 1956-07-17 Today's Date: 01/05/2021    History of Present Illness 65 y.o. man admitted on 2/6/22with AMS, likely from sepsis of unknown source, CXR suspicious for bil opacity, rhabdomyolysis, acute on chronic anemia, seizure disorder with history of non- compliance. Other significant PMH includes RBBB, HTN, ETOH abuse.   Clinical Impression   Pt PTA: Pt living in home with roommate who was reportedly assisting pt with meals, medicines and baths, but son in law found pt down in bad shape. Pt currently limited by decreased strength, decreased mobility, decreased activity tolerance and decreased ability to follow commands. Pt's edema in BUEs, decreased ROM shoulder through digits due to edema. Pt requiring cues to answer questions; SIL able to answer most of the PLOF.  Pt minA to totalA for ADL and modA to maxA for bed mobility to EOB. Deferred standing due to low BP. ] RA for O2 >90%; BP low throughout session 90/59 in sitting 91/57 after a few mins for EOB.  Pt would benefit from continued OT skilled services for ADL, mobility , safety and cognition. OT following acutely.    Follow Up Recommendations  SNF;Supervision/Assistance - 24 hour    Equipment Recommendations  3 in 1 bedside commode    Recommendations for Other Services       Precautions / Restrictions Precautions Precautions: Fall Restrictions Weight Bearing Restrictions: No      Mobility Bed Mobility Overal bed mobility: Needs Assistance Bed Mobility: Supine to Sit;Sit to Supine     Supine to sit: Mod assist;HOB elevated Sit to supine: Mod assist   General bed mobility comments: Mod assist to assist maneuvering BLEs to EOB, support trunk to come up to sitting EOB and assist with weight shifting to scoot.  ModA to lift both legs back into bed, trunk uncontrolled down to pillow.    Transfers Overall transfer level: Needs  assistance Equipment used: 1 person hand held assist Transfers: Sit to/from Stand Sit to Stand: Mod assist         General transfer comment: deferred as pt appeared to be fatigued at EOB and BP too low to advance 90/59 in sitting    Balance Overall balance assessment: Needs assistance Sitting-balance support: Feet supported;Bilateral upper extremity supported Sitting balance-Leahy Scale: Fair Sitting balance - Comments: edema noted in BUEs, able to stabilize trunk at EOB for static sitting   Standing balance support: Single extremity supported Standing balance-Leahy Scale: Poor Standing balance comment: needs external support in standing.                           ADL either performed or assessed with clinical judgement   ADL Overall ADL's : Needs assistance/impaired Eating/Feeding: Minimal assistance;Sitting Eating/Feeding Details (indicate cue type and reason): BUEs swelling and fatigue noted to make more difficult Grooming: Minimal assistance;Sitting   Upper Body Bathing: Minimal assistance;Sitting   Lower Body Bathing: Total assistance;Sitting/lateral leans;Sit to/from stand   Upper Body Dressing : Minimal assistance;Sitting   Lower Body Dressing: Total assistance;Sitting/lateral leans;Sit to/from stand   Toilet Transfer: Total assistance   Toileting- Clothing Manipulation and Hygiene: Total assistance;+2 for physical assistance;+2 for safety/equipment;Bed level Toileting - Clothing Manipulation Details (indicate cue type and reason): aware of BM, but did not know how to use call bell     Functional mobility during ADLs: Maximal assistance;Cueing for safety General ADL Comments: Pt limited by decreased strength, decreased mobility, decreased activity tolerance  and decreased ability to follow commands.     Vision Baseline Vision/History: No visual deficits Patient Visual Report: No change from baseline Vision Assessment?: No apparent visual deficits      Perception     Praxis      Pertinent Vitals/Pain Pain Assessment: No/denies pain     Hand Dominance Right   Extremity/Trunk Assessment Upper Extremity Assessment Upper Extremity Assessment: Generalized weakness;RUE deficits/detail;LUE deficits/detail RUE Deficits / Details: edema in BUEs, decreased ROM shoulder FF 80*, elbow 0-110* and wrist and hand limited due to swelling RUE Coordination: decreased fine motor;decreased gross motor LUE Deficits / Details: edema in BUEs, decreased ROM shoulder FF 80*, elbow 0-110* and wrist and hand limited due to swelling LUE Coordination: decreased fine motor;decreased gross motor   Lower Extremity Assessment Lower Extremity Assessment: Generalized weakness;LLE deficits/detail LLE Deficits / Details: left knee with limited functional flexion ~45 degrees in sitting EOB of L knee flexion, bil LEs with significant skin flaking, edema, and generalized weakness 3-/5.   Cervical / Trunk Assessment Cervical / Trunk Assessment: Kyphotic Cervical / Trunk Exceptions: rounded shoulders and upper back.   Communication Communication Communication: Expressive difficulties (slurry speech)   Cognition Arousal/Alertness: Awake/alert Behavior During Therapy: WFL for tasks assessed/performed Overall Cognitive Status: No family/caregiver present to determine baseline cognitive functioning                                 General Comments: Pt requiring cues to answer questions; SIL able to answer most of the PLOF.   General Comments  RA for O2 >90%; BP low throughout session 90/59 in sitting 91/57 after a few mins for EOB    Exercises     Shoulder Instructions      Home Living Family/patient expects to be discharged to:: Private residence Living Arrangements: Other (Comment) (rooommate) Available Help at Discharge: Family;Available PRN/intermittently Type of Home: House Home Access: Level entry     Home Layout: One level      Bathroom Shower/Tub: Chief Strategy Officer: Standard     Home Equipment: Environmental consultant - 2 wheels          Prior Functioning/Environment Level of Independence: Needs assistance  Gait / Transfers Assistance Needed: uses RW for mobility; 3 falls is the past few months (no warning for falls) ADL's / Homemaking Assistance Needed: son in law reports medical management and groceries were his priorities; hosuemate to assist with cleaning, cooking and showers, but son in law reports that the housemate most likely has not been assisting with these. Pt unable to properly care for self.            OT Problem List: Decreased strength;Decreased activity tolerance;Impaired balance (sitting and/or standing);Decreased cognition;Decreased safety awareness;Increased edema;Impaired UE functional use;Cardiopulmonary status limiting activity;Impaired vision/perception;Decreased knowledge of use of DME or AE;Obesity;Pain      OT Treatment/Interventions: Self-care/ADL training;Therapeutic exercise;Energy conservation;DME and/or AE instruction;Therapeutic activities;Cognitive remediation/compensation;Visual/perceptual remediation/compensation;Patient/family education;Balance training    OT Goals(Current goals can be found in the care plan section) Acute Rehab OT Goals Patient Stated Goal: to sit up OT Goal Formulation: With patient/family Time For Goal Achievement: 01/19/21 Potential to Achieve Goals: Fair ADL Goals Pt Will Perform Grooming: with supervision;sitting Pt Will Perform Upper Body Dressing: with min assist;sitting Pt Will Perform Lower Body Dressing: with mod assist;sitting/lateral leans;sit to/from stand;with adaptive equipment Pt Will Transfer to Toilet: with min assist;stand pivot transfer;bedside commode Pt Will Perform Toileting - Clothing  Manipulation and hygiene: with mod assist;sitting/lateral leans;sit to/from stand Pt/caregiver will Perform Home Exercise Program: Increased  strength;Both right and left upper extremity Additional ADL Goal #1: Pt will increase to minA overall for bed mobility as precursor for OOB ADL.  OT Frequency: Min 2X/week   Barriers to D/C: Inaccessible home environment  Pt's home is deemed unliveable and his roommate is unable to care for him       Co-evaluation              AM-PAC OT "6 Clicks" Daily Activity     Outcome Measure Help from another person eating meals?: A Lot Help from another person taking care of personal grooming?: A Lot Help from another person toileting, which includes using toliet, bedpan, or urinal?: Total Help from another person bathing (including washing, rinsing, drying)?: Total Help from another person to put on and taking off regular upper body clothing?: A Lot Help from another person to put on and taking off regular lower body clothing?: Total 6 Click Score: 9   End of Session Nurse Communication: Mobility status  Activity Tolerance: Patient limited by pain;Patient limited by fatigue Patient left: in bed;with call bell/phone within reach;Other (comment);with nursing/sitter in room (NT in room for bowel movement)  OT Visit Diagnosis: Unsteadiness on feet (R26.81);Muscle weakness (generalized) (M62.81);Pain;Other symptoms and signs involving cognitive function                Time: 1114-1200 OT Time Calculation (min): 46 min Charges:  OT General Charges $OT Visit: 1 Visit OT Evaluation $OT Eval Moderate Complexity: 1 Mod OT Treatments $Self Care/Home Management : 8-22 mins $Therapeutic Activity: 8-22 mins  Flora Lipps, OTR/L Acute Rehabilitation Services Pager: [phone] Office: [phone]  Mason Hart C 01/05/2021, 4:35 PM

## 2021-01-05 NOTE — Progress Notes (Signed)
Family Medicine Teaching Service Daily Progress Note Intern Pager: [phone]  Patient name: Mason Hart Medical record number: [id number] Date of birth: Apr 12, 1956 Age: 65 y.o. Gender: male  Primary Care Provider: [name], NP Consultants: CCM, palliative Code Status: Full  Pt Overview and Major Events to Date:  2/6: admitted 2/8: transferred to ICU  Assessment and Plan:  Freedom Peddy a 65 y.o.malewho presented with altered mental status after being found down. PMH is significant foralcohol use disorder, hypertension, hypothyroidism and seizure disorder.  Altered Mental Status Resolving. Patient is alert and oriented x3 this morning. Appears to be at his baseline. Etiology of initial AMS thought to be multifactorial 2/2 alcohol use disorder, hypothermia, hypoglycemia and possible seizure. -Monitor mental status -Ongoing PT/OT -PT/OT recommended SNF on prior eval -Appreciate SW assistance with SNF placement  Hypotension Patient initially hypotensive with MAPs in the 60s and did not respond to aggressive fluid resuscitation. He was placed on levophed in the ICU with improvement. Most recent BP 126/64. -Levophed discontinued this morning ~4am -Close monitoring of blood pressure  Temperature Instability Most recent temp 97.9. Patient has had intermittent temperature instability this hospitalization with Tmin of 87.7. Improved s/p bair hugger and warm IVF. Am cortisol wnl, TSH and free T4 wnl. -d/c bair hugger -Monitor temperature  Sepsis- ruled out Initially there was concern for sepsis given his hypothermia, hypotension, and evidence of possible pneumonia on CXR. Blood cx and urine cx negative. Procal is <0.10 which is very reassuring against bacterial illness. -s/p antibiotics:  Ceftriaxone and Azithro (2/6)  Unasyn (2/7-2/8)  Ceftriaxone (2/8)  Hypernatremia  Hypokalemia Hypernatremia- improving. Na 148 this morning Hypokalemia- K 3.2 today. -Continue D10W @  30 cc/hr -K repleted with IV potassium chloride x6 per ICU repletion protocol -Daily BMP  Acute on Chronic Normocytic Anemia  Schistocytes S/p 3u pRBCs on admission, Hgb stable at 8.7 this morning. Was FOBT+ initially, but no signs of active bleeding. Has schistocytes on peripheral smear, LDH unremarkable. -Daily CBC -Continue Protonix 40mg  daily (will switch from IV to PO) -Will curbside heme-onc -Consider GI consult  Thrombocytopenia Platelets 68. Likely related to his alcohol use disorder.  -Daily CBC  -Curbside heme-onc -Discontinue Lovenox if platelets <50  Seizure Disorder Patient with hx of seizure disorder, on Keppra daily at home. He is unable to tell me when his last seizure was. High suspicion that patient had seizure prior to arrival. -f/u Keppra level  -Continue Keppra 1000mg  BID -Seizure precautions  Alcohol Use Disorder Alcohol level <10 on admission (2/6). Patient admits he typically drinks "however much he can get his hands on". -CIWA monitoring (can likely d/c tomorrow as he is at least 4 days out from last drink) -Daily thiamine, folic acid, multivitamin  Hypothyroidism Patient has prior hx of hypothyroidism but does not currently take medications at home. TSH wnl on admission. Free T4 also normal. -Repeat thyroid studies as an outpatient  Protein Calorie Malnutrition Nutrition following. -Regular diet -Ensure enlive supplementation TID per nutrition  Bedbugs  Poor living situation S/p decontamination in the ED. -Topical benadryl TID prn for itching -PT recommending SNF, which both patient and family are agreeable to   FEN/GI: Regular diet, D10 @ 30 cc/hr (will d/c if adequate PO intake today) PPx: Lovenox   Status is: Inpatient Remains inpatient appropriate because:Inpatient level of care appropriate due to severity of illness  Dispo: The patient is from: Home              Anticipated d/c is  to: SNF              Anticipated d/c date is:  2 days              Patient currently is not medically stable to d/c.   Difficult to place patient No    Subjective:  Patient denies complaints this morning. He is asking for a diet coke/pepsi. States he had a cough previously but it's improved now.  Objective: Temp:  [95.9 F (35.5 C)-97.9 F (36.6 C)] 97.9 F (36.6 C) (02/10 0349) Pulse Rate:  [54-79] 69 (02/10 0400) Resp:  [0-23] 17 (02/10 0400) BP: (92-136)/(54-75) 129/60 (02/10 0400) SpO2:  [91 %-100 %] 93 % (02/10 0400) Weight:  [96 kg] 96 kg (02/10 0458) Physical Exam: General: alert, resting comfortably in bed, NAD Cardiovascular: RRR, normal S1/S2 Respiratory: normal WOB on room air, lungs CTAB Abdomen: soft, non-distended Extremities: 3+ edema of L lower extremity and bilateral upper extremities Derm: diffuse scaling of skin on extremities Neuro: alert, oriented to person, place and time. Follows commands appropriately. Able to tell me he takes seizure medication at home. States he came to the hospital because he fell and couldn't get up.  Laboratory: Recent Labs  Lab 01/04/21 0820 01/05/21 0436 01/06/21 0055  WBC 14.9* 9.9 8.5  HGB 9.6* 8.0* 8.7*  HCT 31.5* 24.8* 27.0*  PLT 85* QNS 68*   Recent Labs  Lab 01/02/21 1728 01/03/21 0153 01/03/21 0337 01/03/21 0800 01/04/21 0820 01/05/21 0436 01/06/21 0055  NA 150*   < > 151*   < > 153* 154* 148*  K 3.7   < > 2.9*   < > 3.6 3.4* 3.2*  CL 118*   < > 118*   < > 122* 124* 119*  CO2 22   < > 22   < > 21* 21* 21*  BUN 27*   < > 23   < > 14 14 15   CREATININE 1.16   < > 1.01   < > 1.25* 1.12 1.07  CALCIUM 8.4*   < > 8.4*   < > 7.5* 7.5* 7.7*  PROT 6.1*  --  5.7*  --   --  4.7*  --   BILITOT 0.7  --  0.6  --   --  0.4  --   ALKPHOS 59  --  63  --   --  42  --   ALT 28  --  24  --   --  16  --   AST 59*  --  40  --   --  23  --   GLUCOSE 115*   < > 88   < > 71 94 89   < > = values in this interval not displayed.    Imaging/Diagnostic Tests: No new imaging  in past 24h.    , MD 01/06/2021, 6:18 AM PGY-1, [facility] Family Medicine FPTS Intern pager: [phone], text pages welcome

## 2021-01-05 NOTE — Progress Notes (Signed)
Daily Progress Note   Patient Name: Mason Hart       Date: 01/05/2021 DOB: 1956/09/10  Age: 65 y.o. MRN#: [id number] Attending Physician: Mason Holler, MD Primary Care Physician: Mason Rigg, NP Admit Date: 01/02/2021  Reason for Consultation/Follow-up: Establishing goals of care  Subjective: Chart review performed. Received report from primary RN - no acute concerns. RN states patient was able to work with PT and had speech eval. States he is more alert today and the plan is for transfer out of ICU when bed becomes available on progressive unit.  Went to visit patient at bedside - no family/vistors present. Patient was lying in bed asleep - he does wake to voice/gentle touch. He does intermittently fall asleep during conversation but is easily arrousable. He denies pain or shortness of breath. No signs or non-verbal gestures of pain or discomfort noted. No respiratory distress or increased work of breathing; secretions noted but seem improved from yesterday.   I spoke with patient about his wishes for code status as he is still at an increased risk for decline. Encouraged patient to consider DNR/DNI status understanding evidenced based poor outcomes in similar hospitalized patient, as the cause of arrest is likely associated with advanced chronic/terminal illness rather than an easily reversible acute cardio-pulmonary event. I explained that DNR/DNI does not change the medical plan and it only comes into effect after a person has arrested (died).  It is a protective measure to keep [place] from harming the patient in their last moments of life. Patient was not agreeable to DNR/DNI with understanding that he would receive CPR, defibrillation, ACLS medications, or intubation.   We reviewed his  malnutrition and discussed if needed, would he ever want artifical feeding - he stated he would want tube feeds and would want "everything." Explained that at this time, the plan is to hold off on placing feeding tube and I encouraged him to eat/drink as he was able to tolerate. He asked for something to drink while I was present - requested pepsi - I held the cup for him while he drank a cup of pepsi - he tolerated liquid well with no signs of aspiration or difficulty swallowing.  I called son/Mason Hart to provide updates. Explained that patient was more alert today and we were able to have discussion around  code status and feeding tube. We reviewed speech evaluation results. Education provided on what "aspiration" means - reviewed aspiration in context of patient's pneumonia and risk for recurrent aspiration. We reviewed lab work, including WBCs, which today have returned within adequate range. Provided update that patient will be transferring out of ICU today once bed is available on another unit. [name] expressed joy that the patient has improved today. We did review that patient remains very weak and still has risk for decline; however, it is very encouraging he is showing these improvements. I explained to [name] that at this time, PT is recommending rehab on discharge - [name] is agreeable to rehab on discharge if the patient continues to improve. We also discussed long term options - [name] does not feel the patient should return to his previous living conditions - he is interested in getting the patient into assisted living or nursing facility after rehab. I recommended outpatient Palliative Care to follow patient at discharge to assist with continued GOC and planning. [name] was agreeable to outpatient PC and expressed appreciation.   All questions and concerns addressed. Encouraged to call with questions and/or concerns. PMT number previously provided.   Length of Stay: 3  Current Medications: Scheduled  Meds:  . chlorhexidine  15 mL Mouth Rinse BID  . Chlorhexidine Gluconate Cloth  6 each Topical Daily  . dextrose  1 ampule Intravenous Once  . [START ON 01/06/2021] enoxaparin (LOVENOX) injection  40 mg Subcutaneous Q24H  . feeding supplement  237 mL Oral TID BM  . folic acid  1 mg Oral Daily  . mouth rinse  15 mL Mouth Rinse q12n4p  . multivitamin with minerals  1 tablet Oral Daily  . pantoprazole (PROTONIX) IV  40 mg Intravenous Q24H  . thiamine  100 mg Oral Daily   Or  . thiamine  100 mg Intravenous Daily    Continuous Infusions: . sodium chloride 20 mL/hr at 01/05/21 0600  . dextrose 50 mL/hr at 01/05/21 0600  . levETIRAcetam 1,000 mg (01/05/21 0615)  . potassium chloride 10 mEq (01/05/21 1415)    PRN Meds: acetaminophen **OR** acetaminophen, diphenhydrAMINE-zinc acetate, LORazepam **OR** LORazepam  Physical Exam Vitals and nursing note reviewed.  Constitutional:      General: He is not in acute distress.    Appearance: He is cachectic. He is ill-appearing.  Pulmonary:     Effort: No respiratory distress.     Comments: Chest congestion Skin:    General: Skin is warm and dry.  Neurological:     Mental Status: He is alert and oriented to person, place, and time.     Motor: Weakness present.  Psychiatric:        Attention and Perception: Attention normal.        Behavior: Behavior is cooperative.        Cognition and Memory: Cognition and memory normal.             Vital Signs: BP 114/64   Pulse 63   Temp (!) 94.3 F (34.6 C) (Axillary)   Resp 16   Ht 6\' 1"  (1.854 m)   Wt 91.9 kg   SpO2 100%   BMI 26.73 kg/m  SpO2: SpO2: 100 % O2 Device: O2 Device: Nasal Cannula O2 Flow Rate: O2 Flow Rate (L/min): 2 L/min  Intake/output summary:   Intake/Output Summary (Last 24 hours) at 01/05/2021 1425 Last data filed at 01/05/2021 0800 Gross per 24 hour  Intake 1399.07 ml  Output 480 ml  Net  919.07 ml   LBM: Last BM Date: 01/04/21 Baseline Weight: Weight: 91.9  kg Most recent weight: Weight: 91.9 kg       Palliative Assessment/Data: PPS 30%      Patient Active Problem List   Diagnosis Date Noted  . Protein-calorie malnutrition, severe 01/05/2021  . Hypothyroidism 01/04/2021  . Hypoxia 01/04/2021  . Volume overload 01/04/2021  . Diastolic dysfunction 01/04/2021  . Community acquired pneumonia of right lung   . Hypernatremia   . Thrombocytopenia (HCC)   . Weakness   . SOB (shortness of breath)   . Altered mental status 01/02/2021  . Lactic acidosis 10/21/2020  . Metabolic acidosis 10/21/2020  . Hepatic cirrhosis (HCC) 05/26/2020  . Seizure disorder (HCC) 04/29/2020  . Gait abnormality 04/29/2020  . Acute respiratory failure (HCC) 04/13/2020  . Alcohol intoxication with delirium (HCC) 06/04/2019  . Pressure injury of skin 03/19/2019  . Breakthrough seizure (HCC) 03/18/2019  . Edema of left lower extremity 03/18/2019  . Symptomatic anemia 03/18/2019  . Anemia, normocytic normochromic 12/05/2018  . Endotracheally intubated 12/05/2018  . Hypothermia 12/05/2018  . Hyponatremia 12/05/2018  . Postictal state (HCC) 12/05/2018  . Sinusitis 12/05/2018  . Abnormal chest x-ray 12/05/2018  . Hypotension 12/05/2018  . Bradycardia 12/05/2018  . ARF (acute renal failure) (HCC) 11/14/2018  . Clavicle fracture 11/14/2018  . Hypokalemia 06/24/2017  . Hypoglycemia 06/24/2017  . Alcohol use 06/24/2017  . Seizure (HCC) 06/24/2017  . Noncompliance with medication regimen   . Seizures [facility])     Palliative Care Assessment & Plan   Patient Profile: 65 y.o. male  with past medical history of seizures and ETOH abuse presented to the ED from home after an unwitnessed fall and unknown downtime. Patient was admitted on 01/02/2021 with AMS, sepsis of unknown source likely CAP, rhabdomyolysis, weakness/fall, acute on chronic anemia, and protein calorie malnutrition (albumin noted at 1.8 on 01/03/21).   Of note, patient does have social concerns of poor  living conditions, which include roaches and bedbug infestation.   Assessment: Hypovolemia Hypoglycemia Shock Encephalopathy Anemia History of seizures ETOH abuse Weakness/fall Protein calorie malnutrition   Recommendations/Plan:  Continue full scope treatment  Continue full code status  Continue watchful waiting period - patient seems to be improving  Son is agreeable for patient discharge to rehab if recommended by PT  Long term, son want's patient placed in assisted living or nursing facility   If patient discharges to rehab, son is agreeable to outpatient Palliative Care referral   Encouraged oral intake with patient; he is open to artificial feeding/coretrak if medically recommended  Ongoing GOC discussions pending clinical course  PMT will continue to follow and support holistically  Goals of Care and Additional Recommendations:  Limitations on Scope of Treatment: Full Scope Treatment  Code Status:    Code Status Orders  (From admission, onward)         Start     Ordered   01/02/21 2258  Full code  Continuous        01/02/21 2343        Code Status History    Date Active Date Inactive Code Status Order ID Comments User Context   10/21/2020 0434 10/24/2020 2240 Full Code [id number]  Patric Giovanni, MD ED   03/18/2019 2223 03/25/2019 2009 Full Code [id number]  Jaylen Giovanni, MD ED   12/04/2018 2147 12/18/2018 2337 Full Code [id number]  Kathlene Cote, PA-C ED   11/14/2018 0439 11/19/2018 1611 Full Code [id number]  Eduard Clos,  MD ED   06/24/2017 2226 06/27/2017 1735 Full Code [id number]  Lorretta Harp, MD ED   Advance Care Planning Activity       Prognosis:   Unable to determine, guarded in context of his current acute medical situation in addition to malnutrition and chronic illnesses   Discharge Planning:  To Be Determined if continues to medically improve, likely SNF rehab  Care plan was discussed with primary RN, patient,  son/Mason Hart  Thank you for allowing the Palliative Medicine Team to assist in the care of this patient.   Total Time 37 minutes Prolonged Time Billed  no       Greater than 50%  of this time was spent counseling and coordinating care related to the above assessment and plan.  Haskel Khan, NP  Please contact Palliative Medicine Team phone at [phone] for questions and concerns.

## 2021-01-05 NOTE — TOC Initial Note (Signed)
Transition of Care [facility]) - Initial/Assessment Note    Patient Details  Name: Mason Hart MRN: [id number] Date of Birth: May 02, 1956  [facility]) CM/SW Contact:    Lawerance Sabal, RN Phone Number: 01/05/2021, 3:32 PM  Clinical Narrative:             Spoke w patient at bedside.    Discussed plan for after hospital stay. He confirmed demographics, stated he lived at address on face sheet, alone. Discussed PT recs. He stated that he would like to go to SNF at DC to gain strength to be able to return to living at home alone safely.  TOC will workup for SNF.  Discussed w CSW.    Expected Discharge Plan: Skilled Nursing Facility Barriers to Discharge: Continued Medical Work up   Patient Goals and CMS Choice Patient states their goals for this hospitalization and ongoing recovery are:: to go to rehab      Expected Discharge Plan and Services Expected Discharge Plan: Skilled Nursing Facility                                              Prior Living Arrangements/Services                       Activities of Daily Living      Permission Sought/Granted                  Emotional Assessment              Admission diagnosis:  Hypernatremia [E87.0] Altered mental status [R41.82] Weakness [R53.1] SOB (shortness of breath) [R06.02] Thrombocytopenia (HCC) [D69.6] Hypoxia [R09.02] Acute respiratory failure with hypoxia (HCC) [J96.01] Hypotension [I95.9] Symptomatic anemia [D64.9] Community acquired pneumonia of right lung, unspecified part of lung [J18.9] Patient Active Problem List   Diagnosis Date Noted  . Protein-calorie malnutrition, severe 01/05/2021  . Hypothyroidism 01/04/2021  . Hypoxia 01/04/2021  . Volume overload 01/04/2021  . Diastolic dysfunction 01/04/2021  . Community acquired pneumonia of right lung   . Hypernatremia   . Thrombocytopenia (HCC)   . Weakness   . SOB (shortness of breath)   . Altered mental status  01/02/2021  . Lactic acidosis 10/21/2020  . Metabolic acidosis 10/21/2020  . Hepatic cirrhosis (HCC) 05/26/2020  . Seizure disorder (HCC) 04/29/2020  . Gait abnormality 04/29/2020  . Acute respiratory failure (HCC) 04/13/2020  . Alcohol intoxication with delirium (HCC) 06/04/2019  . Pressure injury of skin 03/19/2019  . Breakthrough seizure (HCC) 03/18/2019  . Edema of left lower extremity 03/18/2019  . Symptomatic anemia 03/18/2019  . Anemia, normocytic normochromic 12/05/2018  . Endotracheally intubated 12/05/2018  . Hypothermia 12/05/2018  . Hyponatremia 12/05/2018  . Postictal state (HCC) 12/05/2018  . Sinusitis 12/05/2018  . Abnormal chest x-ray 12/05/2018  . Hypotension 12/05/2018  . Bradycardia 12/05/2018  . ARF (acute renal failure) (HCC) 11/14/2018  . Clavicle fracture 11/14/2018  . Hypokalemia 06/24/2017  . Hypoglycemia 06/24/2017  . Alcohol use 06/24/2017  . Seizure (HCC) 06/24/2017  . Noncompliance with medication regimen   . Seizures (HCC)    PCP:  [name], NP Pharmacy:   [facility] [address] [place]), [place] - [address] [place] [address] Phone: [phone] Fax: [phone]     Social Determinants of Health (SDOH) Interventions  Readmission Risk Interventions Readmission Risk Prevention Plan 03/23/2019  Transportation Screening Complete  PCP or Specialist Appt within 5-7 Days Not Complete  Home Care Screening Complete  Medication Review (RN CM) Not Complete  Some recent data might be hidden

## 2021-01-05 NOTE — Progress Notes (Signed)
Initial Nutrition Assessment  DOCUMENTATION CODES:   Severe malnutrition in context of social or environmental circumstances  INTERVENTION:    Ensure Enlive po TID, each supplement provides 350 kcal and 20 grams of protein.  MVI with minerals daily.  NUTRITION DIAGNOSIS:   Severe Malnutrition related to social / environmental circumstances as evidenced by mild fat depletion,moderate fat depletion,mild muscle depletion,moderate muscle depletion.  GOAL:   Patient will meet greater than or equal to 90% of their needs  MONITOR:   Diet advancement,PO intake,Labs,Skin  REASON FOR ASSESSMENT:   Consult Assessment of nutrition requirement/status  ASSESSMENT:   65 yo male admitted after being found down by family for an unknown duration of time. PMH includes seizures, alcohol abuse, HTN, hypothyroidism, RBBB.   Palliative care team assessed patient 2/8. Noted poor living conditions, including roaches and bedbug infestation. Family wishes to continue full code status.  SLP was able to complete swallow evaluation this morning. Diet advanced to regular with thin liquids. With improved alertness, hopeful for good intake.   Plans to hold off on Cortrak placement for now.  Labs reviewed. Sodium 154, K 3.4 CBG: 80-86  Medications reviewed and include folic acid, MVI with minerals, thiamine, Keppra, potassium chloride. IVF: D10 at 50 ml/h  No new weight today.   Diet Order:   Diet Order            Diet regular Room service appropriate? Yes with Assist; Fluid consistency: Thin  Diet effective now                 EDUCATION NEEDS:   Not appropriate for education at this time  Skin:  Skin Assessment: Reviewed RN Assessment (abrasions to buttocks)  Last BM:  2/8 type 4  Height:   Ht Readings from Last 1 Encounters:  01/02/21 6\' 1"  (1.854 m)    Weight:   Wt Readings from Last 1 Encounters:  01/02/21 91.9 kg    Ideal Body Weight:  83.6 kg  BMI:  Body mass  index is 26.73 kg/m.  Estimated Nutritional Needs:   Kcal:  2100-2400  Protein:  120-140 gm  Fluid:  >/= 2.1 L    03/02/21, RD, LDN, CNSC Please refer to Amion for contact information.

## 2021-01-05 NOTE — Progress Notes (Signed)
eLink Physician-Brief Progress Note Patient Name: Mason Hart DOB: 09-13-1956 MRN: [id number]   Date of Service  01/05/2021  HPI/Events of Note  Hypoglycemia - Blood glucose = 76 --> 85 --> 81.   eICU Interventions  Plan: 1. Restart D10W IV infusion at 30 mL/hour.      Intervention Category Major Interventions: Other:  Lenell Antu 01/05/2021, 9:59 PM

## 2021-01-05 NOTE — Evaluation (Signed)
Physical Therapy Evaluation Patient Details Name: Mason Hart MRN: [id number] DOB: 27-Oct-1956 Today's Date: 01/05/2021   History of Present Illness  65 y.o. man admitted on 2/6/22with AMS, likely from sepsis of unknown source, CXR suspicious for bil opacity, rhabdomyolysis, acute on chronic anemia, seizure disorder with history of non- compliance. Other significant PMH includes RBBB, HTN, ETOH abuse.  Clinical Impression  Pt with significant edema in bil UEs R>L, but was able to participate in coming to EOB to eat sitting upright today.  He eventually fatigued in his arms to the point that I had to take over to finish feeding him.  He had difficulty coming to standing EOB with just me (usually uses a RW and one not available at the time), but we were able to with mod assist overall.  Unable to side step without collapsing down to the bed.  He currently would benefit from SNF level rehab at discharge.  PT will continue to follow acutely for safe mobility progression.   PT to follow acutely for deficits listed below.    Follow Up Recommendations SNF    Equipment Recommendations  3in1 (PT);Wheelchair (measurements PT);Wheelchair cushion (measurements PT)    Recommendations for Other Services       Precautions / Restrictions Precautions Precautions: Fall Restrictions Weight Bearing Restrictions: No      Mobility  Bed Mobility Overal bed mobility: Needs Assistance Bed Mobility: Supine to Sit;Sit to Supine     Supine to sit: Mod assist;HOB elevated Sit to supine: Mod assist   General bed mobility comments: Mod assist to assist in progressing bil legs to EOB, support trunk to come up to sitting EOB and assist with weight shifting to scoot.  Mod assist to lift both legs back into bed, trunk uncontrolled down to pillow.  Two person total assist to scoot toHOB.    Transfers Overall transfer level: Needs assistance Equipment used: 1 person hand held assist Transfers: Sit to/from  Stand Sit to Stand: Mod assist         General transfer comment: Mod assist and needed two attempts to get to standing, per pt report he cannot bend L knee well at baseline due to h/o "knee cap" injury.  L knee looks quite arthritic as well.  Once up he could stabilize over his leg, but when side steps attempted he buckled back down to the bed. He would do significantly better with a RW next time.  Ambulation/Gait                Stairs            Wheelchair Mobility    Modified Rankin (Stroke Patients Only)       Balance Overall balance assessment: Needs assistance Sitting-balance support: Feet supported;Bilateral upper extremity supported Sitting balance-Leahy Scale: Fair Sitting balance - Comments: can sit EOB with supervision and lift one if not both arms up, did not test or challenge in sitting today.  Sat EOB attempting to eat (difficult due to bil UE edema, so after ~10 mins therapist took over and assisted in feeding.)   Standing balance support: Single extremity supported Standing balance-Leahy Scale: Poor Standing balance comment: needs external support in standing.                             Pertinent Vitals/Pain Pain Assessment: No/denies pain    Home Living Family/patient expects to be discharged to:: Private residence Living Arrangements: Other (Comment) (rooommate) Available  Help at Discharge: Family;Available PRN/intermittently Type of Home: House Home Access: Level entry     Home Layout: One level Home Equipment: Walker - 2 wheels      Prior Function Level of Independence: Needs assistance   Gait / Transfers Assistance Needed: uses RW for mobility; 3 falls is the past few months (no warning for falls)  ADL's / Homemaking Assistance Needed: son in law reports medical management and eating, cleaning, showers with assist        Hand Dominance        Extremity/Trunk Assessment   Upper Extremity Assessment Upper  Extremity Assessment: Defer to OT evaluation    Lower Extremity Assessment Lower Extremity Assessment: Generalized weakness;LLE deficits/detail LLE Deficits / Details: left knee with limited functional flexion ~45 degrees in sitting EOB of L knee flexion, bil LEs with significant skin flaking, edema, and generalized weakness 3-/5.    Cervical / Trunk Assessment Cervical / Trunk Assessment: Kyphotic Cervical / Trunk Exceptions: rounded shoulders and upper back.  Communication      Cognition Arousal/Alertness: Awake/alert Behavior During Therapy: WFL for tasks assessed/performed Overall Cognitive Status: No family/caregiver present to determine baseline cognitive functioning                                 General Comments: Sees a bit reliant on others, "maybe" "not sure" for quite a few answers.      General Comments General comments (skin integrity, edema, etc.): VSS on RA throughout session, cough, runny nose, more congested soundind eating in reclined bed than in upright EOB position.  Pt declined wanting to get up to the chair.    Exercises     Assessment/Plan    PT Assessment Patient needs continued PT services  PT Problem List Decreased strength;Decreased activity tolerance;Decreased balance;Decreased range of motion;Decreased mobility;Decreased cognition;Decreased knowledge of use of DME;Decreased safety awareness;Decreased knowledge of precautions;Decreased skin integrity;Obesity       PT Treatment Interventions DME instruction;Gait training;Functional mobility training;Therapeutic activities;Therapeutic exercise;Neuromuscular re-education;Balance training;Cognitive remediation;Patient/family education;Wheelchair mobility training    PT Goals (Current goals can be found in the Care Plan section)  Acute Rehab PT Goals Patient Stated Goal: to finish eating PT Goal Formulation: With patient Time For Goal Achievement: 01/19/21 Potential to Achieve Goals:  Good    Frequency Min 2X/week   Barriers to discharge Decreased caregiver support questionable help from roommate?  Sounds like she cooks, but not sure about physical assist and assist with ADLs/self care    Co-evaluation               AM-PAC PT "6 Clicks" Mobility  Outcome Measure Help needed turning from your back to your side while in a flat bed without using bedrails?: A Lot Help needed moving from lying on your back to sitting on the side of a flat bed without using bedrails?: A Lot Help needed moving to and from a bed to a chair (including a wheelchair)?: A Lot Help needed standing up from a chair using your arms (e.g., wheelchair or bedside chair)?: A Lot Help needed to walk in hospital room?: Total Help needed climbing 3-5 steps with a railing? : Total 6 Click Score: 10    End of Session   Activity Tolerance: Patient limited by fatigue Patient left: in bed;with call bell/phone within reach;with nursing/sitter in room Nurse Communication: Mobility status PT Visit Diagnosis: Muscle weakness (generalized) (M62.81);Difficulty in walking, not elsewhere classified (R26.2)  Time: 7829-5621 PT Time Calculation (min) (ACUTE ONLY): 46 min   Charges:   PT Evaluation $PT Eval Moderate Complexity: 1 Mod PT Treatments $Therapeutic Activity: 23-37 mins       Corinna Capra, PT, DPT  Acute Rehabilitation [phone] pager [phone]) [phone] office

## 2021-01-06 ENCOUNTER — Inpatient Hospital Stay (HOSPITAL_COMMUNITY): Payer: Medicare Other

## 2021-01-06 DIAGNOSIS — E8770 Fluid overload, unspecified: Secondary | ICD-10-CM

## 2021-01-06 DIAGNOSIS — J9601 Acute respiratory failure with hypoxia: Secondary | ICD-10-CM | POA: Diagnosis not present

## 2021-01-06 DIAGNOSIS — R4182 Altered mental status, unspecified: Secondary | ICD-10-CM | POA: Diagnosis not present

## 2021-01-06 DIAGNOSIS — R531 Weakness: Secondary | ICD-10-CM | POA: Diagnosis not present

## 2021-01-06 DIAGNOSIS — R9389 Abnormal findings on diagnostic imaging of other specified body structures: Secondary | ICD-10-CM | POA: Diagnosis not present

## 2021-01-06 LAB — BASIC METABOLIC PANEL
Anion gap: 8 (ref 5–15)
BUN: 15 mg/dL (ref 8–23)
CO2: 21 mmol/L — ABNORMAL LOW (ref 22–32)
Calcium: 7.7 mg/dL — ABNORMAL LOW (ref 8.9–10.3)
Chloride: 119 mmol/L — ABNORMAL HIGH (ref 98–111)
Creatinine, Ser: 1.07 mg/dL (ref 0.61–1.24)
GFR, Estimated: >60 mL/min (ref >60)
Glucose, Bld: 89 mg/dL (ref 70–99)
Potassium: 3.2 mmol/L — ABNORMAL LOW (ref 3.5–5.1)
Sodium: 148 mmol/L — ABNORMAL HIGH (ref 135–145)

## 2021-01-06 LAB — CBC
HCT: 27 % — ABNORMAL LOW (ref 39.0–52.0)
Hemoglobin: 8.7 g/dL — ABNORMAL LOW (ref 13.0–17.0)
MCH: 26.3 pg (ref 26.0–34.0)
MCHC: 32.2 g/dL (ref 30.0–36.0)
MCV: 81.6 fL (ref 80.0–100.0)
Platelets: 68 10*3/uL — ABNORMAL LOW (ref 150–400)
RBC: 3.31 MIL/uL — ABNORMAL LOW (ref 4.22–5.81)
RDW: 21.4 % — ABNORMAL HIGH (ref 11.5–15.5)
WBC: 8.5 10*3/uL (ref 4.0–10.5)
nRBC: 0.9 % — ABNORMAL HIGH (ref 0.0–0.2)

## 2021-01-06 LAB — GLUCOSE, CAPILLARY
Glucose-Capillary: 72 mg/dL (ref 70–99)
Glucose-Capillary: 80 mg/dL (ref 70–99)
Glucose-Capillary: 87 mg/dL (ref 70–99)
Glucose-Capillary: 110 mg/dL — ABNORMAL HIGH (ref 70–99)
Glucose-Capillary: 88 mg/dL (ref 70–99)

## 2021-01-06 LAB — DIC (DISSEMINATED INTRAVASCULAR COAGULATION)PANEL
D-Dimer, Quant: 2.08 ug/mL-FEU — ABNORMAL HIGH (ref 0.00–0.50)
Fibrinogen: 486 mg/dL — ABNORMAL HIGH (ref 210–475)
INR: 1.3 — ABNORMAL HIGH (ref 0.8–1.2)
Platelets: 67 10*3/uL — ABNORMAL LOW (ref 150–400)
Prothrombin Time: 15.8 seconds — ABNORMAL HIGH (ref 11.4–15.2)
Smear Review: NONE SEEN
aPTT: 53 seconds — ABNORMAL HIGH (ref 24–36)

## 2021-01-06 LAB — PROCALCITONIN: Procalcitonin: <0.1 ng/mL

## 2021-01-06 LAB — LEVETIRACETAM LEVEL: Levetiracetam Lvl: 60.2 ug/mL — ABNORMAL HIGH (ref 10.0–40.0)

## 2021-01-06 MED ORDER — POTASSIUM CHLORIDE 10 MEQ/100ML IV SOLN
10.0000 meq | INTRAVENOUS | Status: AC
  Administered 2021-01-06 (×5): 10 meq via INTRAVENOUS
  Filled 2021-01-06 (×5): qty 100

## 2021-01-06 MED ORDER — PANTOPRAZOLE SODIUM 40 MG PO TBEC
40.0000 mg | DELAYED_RELEASE_TABLET | Freq: Every day | ORAL | Status: DC
  Administered 2021-01-07 – 2021-01-13 (×7): 40 mg via ORAL
  Filled 2021-01-06 (×7): qty 1

## 2021-01-06 MED ORDER — LEVETIRACETAM 500 MG PO TABS
1000.0000 mg | ORAL_TABLET | Freq: Two times a day (BID) | ORAL | Status: DC
  Administered 2021-01-06 – 2021-01-13 (×15): 1000 mg via ORAL
  Filled 2021-01-06 (×16): qty 2

## 2021-01-06 NOTE — Progress Notes (Signed)
  Speech Language Pathology Treatment: Dysphagia  Patient Details Name: Mason Hart MRN: [id number] DOB: 1956-02-18 Today's Date: 01/06/2021 Time: 3419-6222 SLP Time Calculation (min) (ACUTE ONLY): 10 min  Assessment / Plan / Recommendation Clinical Impression  Pt found to have subtle signs of dysphagia including multiple swallows, wet vocal quality and throat clearing. Given history of dysphagia and current pna as well as d/c plan to SNF in setting of full code status, will plan on instrumental assessment today.   HPI HPI: Pt is a 65 y.o. male with PMH is significant for alcohol use disorder, hypertension, hypothyroidism and seizure disorder. He presented with extremity weakness and altered mental status; admitted with hypotension, rhabdomyolysis, acute on chronic anemia, and sepsis suspicious for pneumonia treating with CAP coverage. Pt loaded with Keppra in ED.  CXR 2/9: No substantial change in the right greater than left basilar airspace disease with small left pleural effusion. Cortrak scheduled to be placed 2/9. MBS 03/20/19: moderate oropharyngeal dysphagia; Oral phase with decreased oral control due to weakess with premature spillage of boluses into pharynx/larynx.  Also laryngeal penetration of thin and nectar apparent due to decreased epiglottic deflection and timing of laryngeal closure.  Mild vallecular residuals present without pt awareness.  A dysphagia 1 diet with thin liquids was recommended at that time with subsequent advancement to regular thin by 03/24/19.      SLP Plan  MBS       Recommendations  Diet recommendations: Thin liquid;Regular Liquids provided via: Cup;Straw Medication Administration: Whole meds with puree Supervision: Full supervision/cueing for compensatory strategies Compensations: Slow rate;Small sips/bites Postural Changes and/or Swallow Maneuvers: Seated upright 90 degrees                Oral Care Recommendations: Oral care BID Follow up  Recommendations: Skilled Nursing facility SLP Visit Diagnosis: Dysphagia, unspecified (R13.10) Plan: MBS       GO               [name], MA CCC-SLP  Acute Rehabilitation Services Pager [phone] Office [phone]  Claudine Mouton 01/06/2021, 10:04 AM

## 2021-01-06 NOTE — Progress Notes (Addendum)
Modified Barium Swallow Progress Note  Patient Details  Name: Cardale Dorer MRN: [id number] Date of Birth: 03-Nov-1956  Today's Date: 01/06/2021  Modified Barium Swallow completed.  Full report located under Chart Review in the Imaging Section.  Brief recommendations include the following:  Clinical Impression  Pt demonstrates a moderate oropharyngeal dysphagia. Oral deficits including decreased bolus cohesion with pooling of liquids around the floor of the mouth and premature spillage, exacerbated by delayed swallow initaition. Though laryngeal elevation and hyoid excursion are good, epiglottic deflection and vestibular closure are impaired by presence of bony protrusion at C3/4 that blocks deflection and contribues to moderate residue in the valleculae. There are instances of frank penetration in between multiple swallow/subswallow efforts that pt makes with each bolus. Silent penetration accumulates to mild silent trace aspiration if intervention not given. A simple cue for pt to clear throat and swallow again ejects penetrate and clears oral and oropharyngeal resdiue. Thickened liquids and resulted equally in penetration and residue. Recommend pt continue with thin liquids and regular solids though f/u with SLP needed for compensatory strategy training.   Swallow Evaluation Recommendations       SLP Diet Recommendations: Regular solids;Thin liquid   Liquid Administration via: Cup;Straw   Medication Administration: Whole meds with puree   Supervision: Full assist for feeding (pt cannot use hands)   Compensations: Slow rate;Small sips/bites;Clear throat after each swallow;Multiple dry swallows after each bite/sip   Postural Changes: Seated upright at 90 degrees       Other Recommendations: Have oral suction available   [name], MA CCC-SLP  Acute Rehabilitation Services Pager [phone] Office [phone]  Claudine Mouton 01/06/2021,12:58 PM

## 2021-01-06 NOTE — Progress Notes (Incomplete)
Daily Progress Note   Patient Name: Mason Hart       Date: 01/06/2021 DOB: February 12, 1956  Age: 65 y.o. MRN#: [id number] Attending Physician: Billey Co, MD Primary Care Physician: [name], NP Admit Date: 01/02/2021  Reason for Consultation/Follow-up: Establishing goals of care  Subjective: Chart review performed. Received report from Dr. Anner Crete - reports levo was stopped last night, bair hugger was turned off, stable vital signs, and patient ate 100% of breakfast. Received report from primary RN - no acute concerns. RN states patient remains very sleepy.  Went to visit patient at bedside - no family/visitors present. Patient is awake, alert, oriented, and able to participate in conversation. He denies pain or shortness of breath. No signs or non-verbal gestures of pain or discomfort noted. No respiratory distress, increased work of breathing, or secretions noted. He seems less congested today.  Patient states that he feels "alright" today. He does not seem to be a man to complain and seems very easy going. I told him I heard he had eaten all of his breakfast and he smiled - I asked if he enjoyed it and he said it was "alright" for hospital food. I encourage him to continue his oral intake for nutrition and expressed joy that he was eating as it is less likely he will need a feeding tube - the patient was happy to hear this. He states that he feels better today than yesterday and also feels his congestion is better. We discussed plan for him to discharge to a rehab facility - patient is ok with rehab and willing to work with PT/OT to gain strength.   I provided education and discussion around completing HCPOA while in house. [name] has previously stated that he is the patient's financial POA  but not HCPOA - patient confirms this. I educated that document only takes effect if he is unable to speak for himself - explained we would want to approach the person he trusts the most to make medical decisions on his behalf if he were unable to speak for himself. I asked if there was someone he trusted and would want to make medical decisions for him if he could not speak - patient stated "I don't trust anyone" and "I make my own decisions." I reviewed the events of his hospitalization and  reminded him that several days ago, he could not speak for himself and this may happen again in the future. He stated, "I do not plan on that happening," to which I responded that he didn't plan on this event happening either and we never plan on these things to happen - he stated "you're right." I explained what we can plan on, is put things in place to support him if/when it ever did happen again. I recommended he complete HCPOA. I explained legal basis of order for medical decision makers if document was not completed., which would be his wife (whom has her own medical issues) and then all of his children. He still did not want to complete document but states he would think about it. I asked if he had someone in mind he trusted to be a Management consultant, he stated no.  I then introduced, reviewed, and completed MOST form with patient as outlined under Recommendation section below. Once form was completed patient stated he did not have his glasses to review the form and wanted to review it with his son/[name]. I left the form at beside for their review. He was agreeable for PMT to return tomorrow for completion and would let us know if he decided to complete HCPOA.  1:58 PM Spoke with son/[name] and provided updates as outlined per Dr. Anner Crete above. [name] expresses joy that [name] continues to improve. I reviewed with [name] I recommended patient complete HCPOA while in house with understanding he is not close with some of  his children -  I explained to [place] legal basis of order for medical decision makers if document was not completed. I also reviewed that patient and I had completed MOST form and it was left at bedside for their review - I requested he bring the patient's glasses if able. [name] is familiar with what a MOST form is because one was previously completed for his mother/the patient's wife. [name] will be stopping by to visit with the patient his afternoon and states he will review MOST with patient and also speak with him about getting HCPOA completed. I stated PMT would follow up tomorrow to complete MOST and assist with HCPOA if patient was interested. [name] expressed appreciation for call and updates.  All questions and concerns addressed. Encouraged to call with questions and/or concerns. PMT number previosuly provided.  Length of Stay: 4  Current Medications: Scheduled Meds:  . chlorhexidine  15 mL Mouth Rinse BID  . Chlorhexidine Gluconate Cloth  6 each Topical Daily  . enoxaparin (LOVENOX) injection  40 mg Subcutaneous Q24H  . feeding supplement  237 mL Oral TID BM  . folic acid  1 mg Oral Daily  . mouth rinse  15 mL Mouth Rinse q12n4p  . multivitamin with minerals  1 tablet Oral Daily  . [START ON 01/07/2021] pantoprazole  40 mg Oral Daily  . thiamine  100 mg Oral Daily   Or  . thiamine  100 mg Intravenous Daily    Continuous Infusions: . sodium chloride Stopped (01/05/21 1416)  . dextrose 30 mL/hr at 01/06/21 1100  . levETIRAcetam Stopped (01/06/21 0518)    PRN Meds: acetaminophen **OR** acetaminophen, diphenhydrAMINE-zinc acetate  Physical Exam Vitals and nursing note reviewed.  Constitutional:      General: He is not in acute distress.    Appearance: He is ill-appearing.  Pulmonary:     Effort: No respiratory distress.  Musculoskeletal:     Right upper arm: Swelling present.  Left upper arm: Swelling present.  Skin:    General: Skin is warm and dry.  Neurological:      Mental Status: He is alert and oriented to person, place, and time.     Motor: Weakness present.  Psychiatric:        Attention and Perception: Attention normal.        Behavior: Behavior is cooperative.        Cognition and Memory: Cognition and memory normal.             Vital Signs: BP (!) 110/50   Pulse 69   Temp 97.9 F (36.6 C) (Oral)   Resp 17   Ht 6\' 1"  (1.854 m)   Wt 96 kg   SpO2 100%   BMI 27.92 kg/m  SpO2: SpO2: 100 % O2 Device: O2 Device: Room Air O2 Flow Rate: O2 Flow Rate (L/min): 2 L/min  Intake/output summary:   Intake/Output Summary (Last 24 hours) at 01/06/2021 1156 Last data filed at 01/06/2021 1100 Gross per 24 hour  Intake 1594.75 ml  Output 285 ml  Net 1309.75 ml   LBM: Last BM Date: 01/05/21 Baseline Weight: Weight: 91.9 kg Most recent weight: Weight: 96 kg       Palliative Assessment/Data: PPS 40%      Patient Active Problem List   Diagnosis Date Noted  . Protein-calorie malnutrition, severe 01/05/2021  . Hypothyroidism 01/04/2021  . Hypoxia 01/04/2021  . Volume overload 01/04/2021  . Diastolic dysfunction 01/04/2021  . Community acquired pneumonia of right lung   . Hypernatremia   . Thrombocytopenia (HCC)   . Weakness   . SOB (shortness of breath)   . Altered mental status 01/02/2021  . Lactic acidosis 10/21/2020  . Metabolic acidosis 10/21/2020  . Hepatic cirrhosis (HCC) 05/26/2020  . Seizure disorder (HCC) 04/29/2020  . Gait abnormality 04/29/2020  . Acute respiratory failure (HCC) 04/13/2020  . Alcohol intoxication with delirium (HCC) 06/04/2019  . Pressure injury of skin 03/19/2019  . Breakthrough seizure (HCC) 03/18/2019  . Edema of left lower extremity 03/18/2019  . Symptomatic anemia 03/18/2019  . Anemia, normocytic normochromic 12/05/2018  . Endotracheally intubated 12/05/2018  . Hypothermia 12/05/2018  . Hyponatremia 12/05/2018  . Postictal state (HCC) 12/05/2018  . Sinusitis 12/05/2018  . Abnormal chest x-ray  12/05/2018  . Hypotension 12/05/2018  . Bradycardia 12/05/2018  . ARF (acute renal failure) (HCC) 11/14/2018  . Clavicle fracture 11/14/2018  . Hypokalemia 06/24/2017  . Hypoglycemia 06/24/2017  . Alcohol use 06/24/2017  . Seizure (HCC) 06/24/2017  . Noncompliance with medication regimen   . Seizures [facility])     Palliative Care Assessment & Plan   Patient Profile: 65 y.o.malewith past medical history of seizures and ETOH abuse presented to the ED from home after an unwitnessed fall and unknown downtime. Patient wasadmitted on 2/6/2022with AMS, sepsis of unknown source likely CAP, rhabdomyolysis, weakness/fall, acute on chronic anemia, and protein calorie malnutrition (albumin noted at 1.8 on 01/03/21).  Of note, patient does have social concerns of poor living conditions, which include roaches and bedbug infestation.  Assessment: Hypovolemia Hypoglycemia Shock Encephalopathy Anemia History of seizures ETOH abuse Weakness/fall Protein calorie malnutrition  Recommendations/Plan:  Continue current medical treatment  Continue full code status as previously documented  Patient agreeable for discharge to rehab facility  Recommended patient complete HCPOA - he is considering  MOST form completed with patient (but not finalized) as follows: Attempt Resuscitation, Full Scope of Treatment, Determine use or limitation of antibiotics when infection occurs,  IV fluids for a defined trial period, Feeding tube for a defined trial period    Goals of Care and Additional Recommendations:  Limitations on Scope of Treatment: {Recommended Scope and Preferences:21019}  Code Status:    Code Status Orders  (From admission, onward)         Start     Ordered   01/02/21 2258  Full code  Continuous        01/02/21 2343        Code Status History    Date Active Date Inactive Code Status Order ID Comments User Context   10/21/2020 0434 10/24/2020 2240 Full Code [id number]   Caydn Giovanni, MD ED   03/18/2019 2223 03/25/2019 2009 Full Code [id number]  Zubair Giovanni, MD ED   12/04/2018 2147 12/18/2018 2337 Full Code [id number]  Kathlene Cote, PA-C ED   11/14/2018 0439 11/19/2018 1611 Full Code [id number]  Eduard Clos, MD ED   06/24/2017 2226 06/27/2017 1735 Full Code [id number]  Lorretta Harp, MD ED   Advance Care Planning Activity       Prognosis:   {Palliative Care Prognosis:23504}  Discharge Planning:  {Palliative dispostion:23505}  Care plan was discussed with ***  Thank you for allowing the Palliative Medicine Team to assist in the care of this patient.   Time In: *** Time Out: *** Total Time *** Prolonged Time Billed  {YES NO:22349}       Greater than 50%  of this time was spent counseling and coordinating care related to the above assessment and plan.  Haskel Khan, NP  Please contact Palliative Medicine Team phone at [phone] for questions and concerns.

## 2021-01-06 NOTE — Consult Note (Signed)
Oral - Honey Teaspoon -- Oral - Honey Cup -- Oral - Nectar Teaspoon --  Oral - Nectar Cup -- Oral - Nectar Straw Decreased bolus cohesion;Lingual/palatal residue Oral - Thin Teaspoon -- Oral - Thin Cup Decreased bolus cohesion;Lingual/palatal residue Oral - Thin Straw -- Oral - Puree Decreased bolus cohesion;Lingual/palatal residue Oral - Mech Soft -- Oral - Regular Decreased bolus cohesion;Lingual/palatal residue Oral - Multi-Consistency -- Oral - Pill Decreased bolus cohesion;Lingual/palatal residue Oral Phase - Comment --  CHL IP PHARYNGEAL PHASE 01/06/2021 Pharyngeal Phase Impaired Pharyngeal- Pudding Teaspoon -- Pharyngeal -- Pharyngeal- Pudding Cup -- Pharyngeal -- Pharyngeal- Honey Teaspoon -- Pharyngeal -- Pharyngeal- Honey Cup -- Pharyngeal -- Pharyngeal- Nectar Teaspoon -- Pharyngeal -- Pharyngeal- Nectar Cup -- Pharyngeal -- Pharyngeal- Nectar Straw Delayed swallow initiation-pyriform sinuses;Reduced epiglottic inversion;Reduced airway/laryngeal closure;Trace aspiration;Penetration/Aspiration during swallow;Penetration/Apiration after swallow;Pharyngeal residue - valleculae;Pharyngeal residue - pyriform Pharyngeal Material enters airway, passes BELOW cords without attempt by patient to eject out (silent aspiration);Material enters airway, CONTACTS cords and not ejected out Pharyngeal- Thin Teaspoon -- Pharyngeal -- Pharyngeal- Thin Cup Delayed swallow initiation-pyriform sinuses;Reduced epiglottic inversion;Reduced airway/laryngeal closure;Trace aspiration;Penetration/Aspiration during swallow;Penetration/Apiration after swallow;Pharyngeal residue - valleculae;Pharyngeal residue - pyriform Pharyngeal Material enters airway, passes BELOW cords without attempt by patient to eject out (silent aspiration);Material enters airway, CONTACTS cords and not ejected out;Material enters airway, CONTACTS cords and then ejected out Pharyngeal- Thin Straw Delayed swallow initiation-pyriform sinuses;Reduced epiglottic inversion;Reduced airway/laryngeal closure;Trace  aspiration;Penetration/Aspiration during swallow;Penetration/Apiration after swallow;Pharyngeal residue - valleculae;Pharyngeal residue - pyriform Pharyngeal Material enters airway, passes BELOW cords without attempt by patient to eject out (silent aspiration);Material enters airway, CONTACTS cords and not ejected out;Material enters airway, CONTACTS cords and then ejected out Pharyngeal- Puree Delayed swallow initiation-pyriform sinuses;Reduced epiglottic inversion;Reduced airway/laryngeal closure;Pharyngeal residue - valleculae;Pharyngeal residue - pyriform Pharyngeal -- Pharyngeal- Mechanical Soft -- Pharyngeal -- Pharyngeal- Regular Delayed swallow initiation-pyriform sinuses;Reduced epiglottic inversion;Reduced airway/laryngeal closure;Pharyngeal residue - valleculae;Pharyngeal residue - pyriform Pharyngeal -- Pharyngeal- Multi-consistency -- Pharyngeal -- Pharyngeal- Pill -- Pharyngeal -- Pharyngeal Comment --  CHL IP CERVICAL ESOPHAGEAL PHASE 03/20/2019 Cervical Esophageal Phase Impaired Pudding Teaspoon -- Pudding Cup -- Honey Teaspoon -- Honey Cup -- Nectar Teaspoon -- Nectar Cup -- Nectar Straw -- Thin Teaspoon -- Thin Cup -- Thin Straw -- Puree -- Mechanical Soft -- Regular -- Multi-consistency -- Pill -- Cervical Esophageal Comment appearance of delayed clearance distally with retrograde propulsion, radiologist not present to confirm, ? consistent with dysmotility? Herbie Baltimore, MA CCC-SLP Acute Rehabilitation Services Pager [phone] Office [phone] Lynann Beaver 01/06/2021, 1:03 PM              ECHOCARDIOGRAM COMPLETE  Result Date: 01/04/2021    ECHOCARDIOGRAM REPORT   Patient Name:   Mason Hart Date of Exam: 01/04/2021 Medical Rec #:  [id number]    Height:       73.0 in Accession #:    [id number]   Weight:       202.6 lb Date of Birth:  25-Dec-1955    BSA:          2.163 m Patient Age:    65 years     BP:           85/43 mmHg Patient Gender: M            HR:           74 bpm. Exam  Location:  Inpatient Procedure: 2D Echo, Cardiac Doppler and Color Doppler Indications:    Sepsis [id number]  History:  Oral - Honey Teaspoon -- Oral - Honey Cup -- Oral - Nectar Teaspoon --  Oral - Nectar Cup -- Oral - Nectar Straw Decreased bolus cohesion;Lingual/palatal residue Oral - Thin Teaspoon -- Oral - Thin Cup Decreased bolus cohesion;Lingual/palatal residue Oral - Thin Straw -- Oral - Puree Decreased bolus cohesion;Lingual/palatal residue Oral - Mech Soft -- Oral - Regular Decreased bolus cohesion;Lingual/palatal residue Oral - Multi-Consistency -- Oral - Pill Decreased bolus cohesion;Lingual/palatal residue Oral Phase - Comment --  CHL IP PHARYNGEAL PHASE 01/06/2021 Pharyngeal Phase Impaired Pharyngeal- Pudding Teaspoon -- Pharyngeal -- Pharyngeal- Pudding Cup -- Pharyngeal -- Pharyngeal- Honey Teaspoon -- Pharyngeal -- Pharyngeal- Honey Cup -- Pharyngeal -- Pharyngeal- Nectar Teaspoon -- Pharyngeal -- Pharyngeal- Nectar Cup -- Pharyngeal -- Pharyngeal- Nectar Straw Delayed swallow initiation-pyriform sinuses;Reduced epiglottic inversion;Reduced airway/laryngeal closure;Trace aspiration;Penetration/Aspiration during swallow;Penetration/Apiration after swallow;Pharyngeal residue - valleculae;Pharyngeal residue - pyriform Pharyngeal Material enters airway, passes BELOW cords without attempt by patient to eject out (silent aspiration);Material enters airway, CONTACTS cords and not ejected out Pharyngeal- Thin Teaspoon -- Pharyngeal -- Pharyngeal- Thin Cup Delayed swallow initiation-pyriform sinuses;Reduced epiglottic inversion;Reduced airway/laryngeal closure;Trace aspiration;Penetration/Aspiration during swallow;Penetration/Apiration after swallow;Pharyngeal residue - valleculae;Pharyngeal residue - pyriform Pharyngeal Material enters airway, passes BELOW cords without attempt by patient to eject out (silent aspiration);Material enters airway, CONTACTS cords and not ejected out;Material enters airway, CONTACTS cords and then ejected out Pharyngeal- Thin Straw Delayed swallow initiation-pyriform sinuses;Reduced epiglottic inversion;Reduced airway/laryngeal closure;Trace  aspiration;Penetration/Aspiration during swallow;Penetration/Apiration after swallow;Pharyngeal residue - valleculae;Pharyngeal residue - pyriform Pharyngeal Material enters airway, passes BELOW cords without attempt by patient to eject out (silent aspiration);Material enters airway, CONTACTS cords and not ejected out;Material enters airway, CONTACTS cords and then ejected out Pharyngeal- Puree Delayed swallow initiation-pyriform sinuses;Reduced epiglottic inversion;Reduced airway/laryngeal closure;Pharyngeal residue - valleculae;Pharyngeal residue - pyriform Pharyngeal -- Pharyngeal- Mechanical Soft -- Pharyngeal -- Pharyngeal- Regular Delayed swallow initiation-pyriform sinuses;Reduced epiglottic inversion;Reduced airway/laryngeal closure;Pharyngeal residue - valleculae;Pharyngeal residue - pyriform Pharyngeal -- Pharyngeal- Multi-consistency -- Pharyngeal -- Pharyngeal- Pill -- Pharyngeal -- Pharyngeal Comment --  CHL IP CERVICAL ESOPHAGEAL PHASE 03/20/2019 Cervical Esophageal Phase Impaired Pudding Teaspoon -- Pudding Cup -- Honey Teaspoon -- Honey Cup -- Nectar Teaspoon -- Nectar Cup -- Nectar Straw -- Thin Teaspoon -- Thin Cup -- Thin Straw -- Puree -- Mechanical Soft -- Regular -- Multi-consistency -- Pill -- Cervical Esophageal Comment appearance of delayed clearance distally with retrograde propulsion, radiologist not present to confirm, ? consistent with dysmotility? Herbie Baltimore, MA CCC-SLP Acute Rehabilitation Services Pager [phone] Office [phone] Lynann Beaver 01/06/2021, 1:03 PM              ECHOCARDIOGRAM COMPLETE  Result Date: 01/04/2021    ECHOCARDIOGRAM REPORT   Patient Name:   Mason Hart Date of Exam: 01/04/2021 Medical Rec #:  [id number]    Height:       73.0 in Accession #:    [id number]   Weight:       202.6 lb Date of Birth:  25-Dec-1955    BSA:          2.163 m Patient Age:    65 years     BP:           85/43 mmHg Patient Gender: M            HR:           74 bpm. Exam  Location:  Inpatient Procedure: 2D Echo, Cardiac Doppler and Color Doppler Indications:    Sepsis [id number]  History:  Oral - Honey Teaspoon -- Oral - Honey Cup -- Oral - Nectar Teaspoon --  Oral - Nectar Cup -- Oral - Nectar Straw Decreased bolus cohesion;Lingual/palatal residue Oral - Thin Teaspoon -- Oral - Thin Cup Decreased bolus cohesion;Lingual/palatal residue Oral - Thin Straw -- Oral - Puree Decreased bolus cohesion;Lingual/palatal residue Oral - Mech Soft -- Oral - Regular Decreased bolus cohesion;Lingual/palatal residue Oral - Multi-Consistency -- Oral - Pill Decreased bolus cohesion;Lingual/palatal residue Oral Phase - Comment --  CHL IP PHARYNGEAL PHASE 01/06/2021 Pharyngeal Phase Impaired Pharyngeal- Pudding Teaspoon -- Pharyngeal -- Pharyngeal- Pudding Cup -- Pharyngeal -- Pharyngeal- Honey Teaspoon -- Pharyngeal -- Pharyngeal- Honey Cup -- Pharyngeal -- Pharyngeal- Nectar Teaspoon -- Pharyngeal -- Pharyngeal- Nectar Cup -- Pharyngeal -- Pharyngeal- Nectar Straw Delayed swallow initiation-pyriform sinuses;Reduced epiglottic inversion;Reduced airway/laryngeal closure;Trace aspiration;Penetration/Aspiration during swallow;Penetration/Apiration after swallow;Pharyngeal residue - valleculae;Pharyngeal residue - pyriform Pharyngeal Material enters airway, passes BELOW cords without attempt by patient to eject out (silent aspiration);Material enters airway, CONTACTS cords and not ejected out Pharyngeal- Thin Teaspoon -- Pharyngeal -- Pharyngeal- Thin Cup Delayed swallow initiation-pyriform sinuses;Reduced epiglottic inversion;Reduced airway/laryngeal closure;Trace aspiration;Penetration/Aspiration during swallow;Penetration/Apiration after swallow;Pharyngeal residue - valleculae;Pharyngeal residue - pyriform Pharyngeal Material enters airway, passes BELOW cords without attempt by patient to eject out (silent aspiration);Material enters airway, CONTACTS cords and not ejected out;Material enters airway, CONTACTS cords and then ejected out Pharyngeal- Thin Straw Delayed swallow initiation-pyriform sinuses;Reduced epiglottic inversion;Reduced airway/laryngeal closure;Trace  aspiration;Penetration/Aspiration during swallow;Penetration/Apiration after swallow;Pharyngeal residue - valleculae;Pharyngeal residue - pyriform Pharyngeal Material enters airway, passes BELOW cords without attempt by patient to eject out (silent aspiration);Material enters airway, CONTACTS cords and not ejected out;Material enters airway, CONTACTS cords and then ejected out Pharyngeal- Puree Delayed swallow initiation-pyriform sinuses;Reduced epiglottic inversion;Reduced airway/laryngeal closure;Pharyngeal residue - valleculae;Pharyngeal residue - pyriform Pharyngeal -- Pharyngeal- Mechanical Soft -- Pharyngeal -- Pharyngeal- Regular Delayed swallow initiation-pyriform sinuses;Reduced epiglottic inversion;Reduced airway/laryngeal closure;Pharyngeal residue - valleculae;Pharyngeal residue - pyriform Pharyngeal -- Pharyngeal- Multi-consistency -- Pharyngeal -- Pharyngeal- Pill -- Pharyngeal -- Pharyngeal Comment --  CHL IP CERVICAL ESOPHAGEAL PHASE 03/20/2019 Cervical Esophageal Phase Impaired Pudding Teaspoon -- Pudding Cup -- Honey Teaspoon -- Honey Cup -- Nectar Teaspoon -- Nectar Cup -- Nectar Straw -- Thin Teaspoon -- Thin Cup -- Thin Straw -- Puree -- Mechanical Soft -- Regular -- Multi-consistency -- Pill -- Cervical Esophageal Comment appearance of delayed clearance distally with retrograde propulsion, radiologist not present to confirm, ? consistent with dysmotility? Herbie Baltimore, MA CCC-SLP Acute Rehabilitation Services Pager [phone] Office [phone] Lynann Beaver 01/06/2021, 1:03 PM              ECHOCARDIOGRAM COMPLETE  Result Date: 01/04/2021    ECHOCARDIOGRAM REPORT   Patient Name:   Mason Hart Date of Exam: 01/04/2021 Medical Rec #:  [id number]    Height:       73.0 in Accession #:    [id number]   Weight:       202.6 lb Date of Birth:  25-Dec-1955    BSA:          2.163 m Patient Age:    65 years     BP:           85/43 mmHg Patient Gender: M            HR:           74 bpm. Exam  Location:  Inpatient Procedure: 2D Echo, Cardiac Doppler and Color Doppler Indications:    Sepsis [id number]  History:  Oral - Honey Teaspoon -- Oral - Honey Cup -- Oral - Nectar Teaspoon --  Oral - Nectar Cup -- Oral - Nectar Straw Decreased bolus cohesion;Lingual/palatal residue Oral - Thin Teaspoon -- Oral - Thin Cup Decreased bolus cohesion;Lingual/palatal residue Oral - Thin Straw -- Oral - Puree Decreased bolus cohesion;Lingual/palatal residue Oral - Mech Soft -- Oral - Regular Decreased bolus cohesion;Lingual/palatal residue Oral - Multi-Consistency -- Oral - Pill Decreased bolus cohesion;Lingual/palatal residue Oral Phase - Comment --  CHL IP PHARYNGEAL PHASE 01/06/2021 Pharyngeal Phase Impaired Pharyngeal- Pudding Teaspoon -- Pharyngeal -- Pharyngeal- Pudding Cup -- Pharyngeal -- Pharyngeal- Honey Teaspoon -- Pharyngeal -- Pharyngeal- Honey Cup -- Pharyngeal -- Pharyngeal- Nectar Teaspoon -- Pharyngeal -- Pharyngeal- Nectar Cup -- Pharyngeal -- Pharyngeal- Nectar Straw Delayed swallow initiation-pyriform sinuses;Reduced epiglottic inversion;Reduced airway/laryngeal closure;Trace aspiration;Penetration/Aspiration during swallow;Penetration/Apiration after swallow;Pharyngeal residue - valleculae;Pharyngeal residue - pyriform Pharyngeal Material enters airway, passes BELOW cords without attempt by patient to eject out (silent aspiration);Material enters airway, CONTACTS cords and not ejected out Pharyngeal- Thin Teaspoon -- Pharyngeal -- Pharyngeal- Thin Cup Delayed swallow initiation-pyriform sinuses;Reduced epiglottic inversion;Reduced airway/laryngeal closure;Trace aspiration;Penetration/Aspiration during swallow;Penetration/Apiration after swallow;Pharyngeal residue - valleculae;Pharyngeal residue - pyriform Pharyngeal Material enters airway, passes BELOW cords without attempt by patient to eject out (silent aspiration);Material enters airway, CONTACTS cords and not ejected out;Material enters airway, CONTACTS cords and then ejected out Pharyngeal- Thin Straw Delayed swallow initiation-pyriform sinuses;Reduced epiglottic inversion;Reduced airway/laryngeal closure;Trace  aspiration;Penetration/Aspiration during swallow;Penetration/Apiration after swallow;Pharyngeal residue - valleculae;Pharyngeal residue - pyriform Pharyngeal Material enters airway, passes BELOW cords without attempt by patient to eject out (silent aspiration);Material enters airway, CONTACTS cords and not ejected out;Material enters airway, CONTACTS cords and then ejected out Pharyngeal- Puree Delayed swallow initiation-pyriform sinuses;Reduced epiglottic inversion;Reduced airway/laryngeal closure;Pharyngeal residue - valleculae;Pharyngeal residue - pyriform Pharyngeal -- Pharyngeal- Mechanical Soft -- Pharyngeal -- Pharyngeal- Regular Delayed swallow initiation-pyriform sinuses;Reduced epiglottic inversion;Reduced airway/laryngeal closure;Pharyngeal residue - valleculae;Pharyngeal residue - pyriform Pharyngeal -- Pharyngeal- Multi-consistency -- Pharyngeal -- Pharyngeal- Pill -- Pharyngeal -- Pharyngeal Comment --  CHL IP CERVICAL ESOPHAGEAL PHASE 03/20/2019 Cervical Esophageal Phase Impaired Pudding Teaspoon -- Pudding Cup -- Honey Teaspoon -- Honey Cup -- Nectar Teaspoon -- Nectar Cup -- Nectar Straw -- Thin Teaspoon -- Thin Cup -- Thin Straw -- Puree -- Mechanical Soft -- Regular -- Multi-consistency -- Pill -- Cervical Esophageal Comment appearance of delayed clearance distally with retrograde propulsion, radiologist not present to confirm, ? consistent with dysmotility? Herbie Baltimore, MA CCC-SLP Acute Rehabilitation Services Pager [phone] Office [phone] Lynann Beaver 01/06/2021, 1:03 PM              ECHOCARDIOGRAM COMPLETE  Result Date: 01/04/2021    ECHOCARDIOGRAM REPORT   Patient Name:   Mason Hart Date of Exam: 01/04/2021 Medical Rec #:  [id number]    Height:       73.0 in Accession #:    [id number]   Weight:       202.6 lb Date of Birth:  25-Dec-1955    BSA:          2.163 m Patient Age:    65 years     BP:           85/43 mmHg Patient Gender: M            HR:           74 bpm. Exam  Location:  Inpatient Procedure: 2D Echo, Cardiac Doppler and Color Doppler Indications:    Sepsis [id number]  History:  Oral - Honey Teaspoon -- Oral - Honey Cup -- Oral - Nectar Teaspoon --  Oral - Nectar Cup -- Oral - Nectar Straw Decreased bolus cohesion;Lingual/palatal residue Oral - Thin Teaspoon -- Oral - Thin Cup Decreased bolus cohesion;Lingual/palatal residue Oral - Thin Straw -- Oral - Puree Decreased bolus cohesion;Lingual/palatal residue Oral - Mech Soft -- Oral - Regular Decreased bolus cohesion;Lingual/palatal residue Oral - Multi-Consistency -- Oral - Pill Decreased bolus cohesion;Lingual/palatal residue Oral Phase - Comment --  CHL IP PHARYNGEAL PHASE 01/06/2021 Pharyngeal Phase Impaired Pharyngeal- Pudding Teaspoon -- Pharyngeal -- Pharyngeal- Pudding Cup -- Pharyngeal -- Pharyngeal- Honey Teaspoon -- Pharyngeal -- Pharyngeal- Honey Cup -- Pharyngeal -- Pharyngeal- Nectar Teaspoon -- Pharyngeal -- Pharyngeal- Nectar Cup -- Pharyngeal -- Pharyngeal- Nectar Straw Delayed swallow initiation-pyriform sinuses;Reduced epiglottic inversion;Reduced airway/laryngeal closure;Trace aspiration;Penetration/Aspiration during swallow;Penetration/Apiration after swallow;Pharyngeal residue - valleculae;Pharyngeal residue - pyriform Pharyngeal Material enters airway, passes BELOW cords without attempt by patient to eject out (silent aspiration);Material enters airway, CONTACTS cords and not ejected out Pharyngeal- Thin Teaspoon -- Pharyngeal -- Pharyngeal- Thin Cup Delayed swallow initiation-pyriform sinuses;Reduced epiglottic inversion;Reduced airway/laryngeal closure;Trace aspiration;Penetration/Aspiration during swallow;Penetration/Apiration after swallow;Pharyngeal residue - valleculae;Pharyngeal residue - pyriform Pharyngeal Material enters airway, passes BELOW cords without attempt by patient to eject out (silent aspiration);Material enters airway, CONTACTS cords and not ejected out;Material enters airway, CONTACTS cords and then ejected out Pharyngeal- Thin Straw Delayed swallow initiation-pyriform sinuses;Reduced epiglottic inversion;Reduced airway/laryngeal closure;Trace  aspiration;Penetration/Aspiration during swallow;Penetration/Apiration after swallow;Pharyngeal residue - valleculae;Pharyngeal residue - pyriform Pharyngeal Material enters airway, passes BELOW cords without attempt by patient to eject out (silent aspiration);Material enters airway, CONTACTS cords and not ejected out;Material enters airway, CONTACTS cords and then ejected out Pharyngeal- Puree Delayed swallow initiation-pyriform sinuses;Reduced epiglottic inversion;Reduced airway/laryngeal closure;Pharyngeal residue - valleculae;Pharyngeal residue - pyriform Pharyngeal -- Pharyngeal- Mechanical Soft -- Pharyngeal -- Pharyngeal- Regular Delayed swallow initiation-pyriform sinuses;Reduced epiglottic inversion;Reduced airway/laryngeal closure;Pharyngeal residue - valleculae;Pharyngeal residue - pyriform Pharyngeal -- Pharyngeal- Multi-consistency -- Pharyngeal -- Pharyngeal- Pill -- Pharyngeal -- Pharyngeal Comment --  CHL IP CERVICAL ESOPHAGEAL PHASE 03/20/2019 Cervical Esophageal Phase Impaired Pudding Teaspoon -- Pudding Cup -- Honey Teaspoon -- Honey Cup -- Nectar Teaspoon -- Nectar Cup -- Nectar Straw -- Thin Teaspoon -- Thin Cup -- Thin Straw -- Puree -- Mechanical Soft -- Regular -- Multi-consistency -- Pill -- Cervical Esophageal Comment appearance of delayed clearance distally with retrograde propulsion, radiologist not present to confirm, ? consistent with dysmotility? Herbie Baltimore, MA CCC-SLP Acute Rehabilitation Services Pager [phone] Office [phone] Lynann Beaver 01/06/2021, 1:03 PM              ECHOCARDIOGRAM COMPLETE  Result Date: 01/04/2021    ECHOCARDIOGRAM REPORT   Patient Name:   Mason Hart Date of Exam: 01/04/2021 Medical Rec #:  [id number]    Height:       73.0 in Accession #:    [id number]   Weight:       202.6 lb Date of Birth:  25-Dec-1955    BSA:          2.163 m Patient Age:    65 years     BP:           85/43 mmHg Patient Gender: M            HR:           74 bpm. Exam  Location:  Inpatient Procedure: 2D Echo, Cardiac Doppler and Color Doppler Indications:    Sepsis [id number]  History:  Oral - Honey Teaspoon -- Oral - Honey Cup -- Oral - Nectar Teaspoon --  Oral - Nectar Cup -- Oral - Nectar Straw Decreased bolus cohesion;Lingual/palatal residue Oral - Thin Teaspoon -- Oral - Thin Cup Decreased bolus cohesion;Lingual/palatal residue Oral - Thin Straw -- Oral - Puree Decreased bolus cohesion;Lingual/palatal residue Oral - Mech Soft -- Oral - Regular Decreased bolus cohesion;Lingual/palatal residue Oral - Multi-Consistency -- Oral - Pill Decreased bolus cohesion;Lingual/palatal residue Oral Phase - Comment --  CHL IP PHARYNGEAL PHASE 01/06/2021 Pharyngeal Phase Impaired Pharyngeal- Pudding Teaspoon -- Pharyngeal -- Pharyngeal- Pudding Cup -- Pharyngeal -- Pharyngeal- Honey Teaspoon -- Pharyngeal -- Pharyngeal- Honey Cup -- Pharyngeal -- Pharyngeal- Nectar Teaspoon -- Pharyngeal -- Pharyngeal- Nectar Cup -- Pharyngeal -- Pharyngeal- Nectar Straw Delayed swallow initiation-pyriform sinuses;Reduced epiglottic inversion;Reduced airway/laryngeal closure;Trace aspiration;Penetration/Aspiration during swallow;Penetration/Apiration after swallow;Pharyngeal residue - valleculae;Pharyngeal residue - pyriform Pharyngeal Material enters airway, passes BELOW cords without attempt by patient to eject out (silent aspiration);Material enters airway, CONTACTS cords and not ejected out Pharyngeal- Thin Teaspoon -- Pharyngeal -- Pharyngeal- Thin Cup Delayed swallow initiation-pyriform sinuses;Reduced epiglottic inversion;Reduced airway/laryngeal closure;Trace aspiration;Penetration/Aspiration during swallow;Penetration/Apiration after swallow;Pharyngeal residue - valleculae;Pharyngeal residue - pyriform Pharyngeal Material enters airway, passes BELOW cords without attempt by patient to eject out (silent aspiration);Material enters airway, CONTACTS cords and not ejected out;Material enters airway, CONTACTS cords and then ejected out Pharyngeal- Thin Straw Delayed swallow initiation-pyriform sinuses;Reduced epiglottic inversion;Reduced airway/laryngeal closure;Trace  aspiration;Penetration/Aspiration during swallow;Penetration/Apiration after swallow;Pharyngeal residue - valleculae;Pharyngeal residue - pyriform Pharyngeal Material enters airway, passes BELOW cords without attempt by patient to eject out (silent aspiration);Material enters airway, CONTACTS cords and not ejected out;Material enters airway, CONTACTS cords and then ejected out Pharyngeal- Puree Delayed swallow initiation-pyriform sinuses;Reduced epiglottic inversion;Reduced airway/laryngeal closure;Pharyngeal residue - valleculae;Pharyngeal residue - pyriform Pharyngeal -- Pharyngeal- Mechanical Soft -- Pharyngeal -- Pharyngeal- Regular Delayed swallow initiation-pyriform sinuses;Reduced epiglottic inversion;Reduced airway/laryngeal closure;Pharyngeal residue - valleculae;Pharyngeal residue - pyriform Pharyngeal -- Pharyngeal- Multi-consistency -- Pharyngeal -- Pharyngeal- Pill -- Pharyngeal -- Pharyngeal Comment --  CHL IP CERVICAL ESOPHAGEAL PHASE 03/20/2019 Cervical Esophageal Phase Impaired Pudding Teaspoon -- Pudding Cup -- Honey Teaspoon -- Honey Cup -- Nectar Teaspoon -- Nectar Cup -- Nectar Straw -- Thin Teaspoon -- Thin Cup -- Thin Straw -- Puree -- Mechanical Soft -- Regular -- Multi-consistency -- Pill -- Cervical Esophageal Comment appearance of delayed clearance distally with retrograde propulsion, radiologist not present to confirm, ? consistent with dysmotility? Herbie Baltimore, MA CCC-SLP Acute Rehabilitation Services Pager [phone] Office [phone] Lynann Beaver 01/06/2021, 1:03 PM              ECHOCARDIOGRAM COMPLETE  Result Date: 01/04/2021    ECHOCARDIOGRAM REPORT   Patient Name:   Mason Hart Date of Exam: 01/04/2021 Medical Rec #:  [id number]    Height:       73.0 in Accession #:    [id number]   Weight:       202.6 lb Date of Birth:  25-Dec-1955    BSA:          2.163 m Patient Age:    65 years     BP:           85/43 mmHg Patient Gender: M            HR:           74 bpm. Exam  Location:  Inpatient Procedure: 2D Echo, Cardiac Doppler and Color Doppler Indications:    Sepsis [id number]  History:  Oral - Honey Teaspoon -- Oral - Honey Cup -- Oral - Nectar Teaspoon --  Oral - Nectar Cup -- Oral - Nectar Straw Decreased bolus cohesion;Lingual/palatal residue Oral - Thin Teaspoon -- Oral - Thin Cup Decreased bolus cohesion;Lingual/palatal residue Oral - Thin Straw -- Oral - Puree Decreased bolus cohesion;Lingual/palatal residue Oral - Mech Soft -- Oral - Regular Decreased bolus cohesion;Lingual/palatal residue Oral - Multi-Consistency -- Oral - Pill Decreased bolus cohesion;Lingual/palatal residue Oral Phase - Comment --  CHL IP PHARYNGEAL PHASE 01/06/2021 Pharyngeal Phase Impaired Pharyngeal- Pudding Teaspoon -- Pharyngeal -- Pharyngeal- Pudding Cup -- Pharyngeal -- Pharyngeal- Honey Teaspoon -- Pharyngeal -- Pharyngeal- Honey Cup -- Pharyngeal -- Pharyngeal- Nectar Teaspoon -- Pharyngeal -- Pharyngeal- Nectar Cup -- Pharyngeal -- Pharyngeal- Nectar Straw Delayed swallow initiation-pyriform sinuses;Reduced epiglottic inversion;Reduced airway/laryngeal closure;Trace aspiration;Penetration/Aspiration during swallow;Penetration/Apiration after swallow;Pharyngeal residue - valleculae;Pharyngeal residue - pyriform Pharyngeal Material enters airway, passes BELOW cords without attempt by patient to eject out (silent aspiration);Material enters airway, CONTACTS cords and not ejected out Pharyngeal- Thin Teaspoon -- Pharyngeal -- Pharyngeal- Thin Cup Delayed swallow initiation-pyriform sinuses;Reduced epiglottic inversion;Reduced airway/laryngeal closure;Trace aspiration;Penetration/Aspiration during swallow;Penetration/Apiration after swallow;Pharyngeal residue - valleculae;Pharyngeal residue - pyriform Pharyngeal Material enters airway, passes BELOW cords without attempt by patient to eject out (silent aspiration);Material enters airway, CONTACTS cords and not ejected out;Material enters airway, CONTACTS cords and then ejected out Pharyngeal- Thin Straw Delayed swallow initiation-pyriform sinuses;Reduced epiglottic inversion;Reduced airway/laryngeal closure;Trace  aspiration;Penetration/Aspiration during swallow;Penetration/Apiration after swallow;Pharyngeal residue - valleculae;Pharyngeal residue - pyriform Pharyngeal Material enters airway, passes BELOW cords without attempt by patient to eject out (silent aspiration);Material enters airway, CONTACTS cords and not ejected out;Material enters airway, CONTACTS cords and then ejected out Pharyngeal- Puree Delayed swallow initiation-pyriform sinuses;Reduced epiglottic inversion;Reduced airway/laryngeal closure;Pharyngeal residue - valleculae;Pharyngeal residue - pyriform Pharyngeal -- Pharyngeal- Mechanical Soft -- Pharyngeal -- Pharyngeal- Regular Delayed swallow initiation-pyriform sinuses;Reduced epiglottic inversion;Reduced airway/laryngeal closure;Pharyngeal residue - valleculae;Pharyngeal residue - pyriform Pharyngeal -- Pharyngeal- Multi-consistency -- Pharyngeal -- Pharyngeal- Pill -- Pharyngeal -- Pharyngeal Comment --  CHL IP CERVICAL ESOPHAGEAL PHASE 03/20/2019 Cervical Esophageal Phase Impaired Pudding Teaspoon -- Pudding Cup -- Honey Teaspoon -- Honey Cup -- Nectar Teaspoon -- Nectar Cup -- Nectar Straw -- Thin Teaspoon -- Thin Cup -- Thin Straw -- Puree -- Mechanical Soft -- Regular -- Multi-consistency -- Pill -- Cervical Esophageal Comment appearance of delayed clearance distally with retrograde propulsion, radiologist not present to confirm, ? consistent with dysmotility? Herbie Baltimore, MA CCC-SLP Acute Rehabilitation Services Pager [phone] Office [phone] Lynann Beaver 01/06/2021, 1:03 PM              ECHOCARDIOGRAM COMPLETE  Result Date: 01/04/2021    ECHOCARDIOGRAM REPORT   Patient Name:   Mason Hart Date of Exam: 01/04/2021 Medical Rec #:  [id number]    Height:       73.0 in Accession #:    [id number]   Weight:       202.6 lb Date of Birth:  25-Dec-1955    BSA:          2.163 m Patient Age:    65 years     BP:           85/43 mmHg Patient Gender: M            HR:           74 bpm. Exam  Location:  Inpatient Procedure: 2D Echo, Cardiac Doppler and Color Doppler Indications:    Sepsis [id number]  History:  By: Lowella Grip III M.D.   On: 01/06/2021 10:47   DG Swallowing Func-Speech Pathology  Result Date: 01/06/2021 Objective Swallowing Evaluation: Type of Study: MBS-Modified Barium Swallow Study   Patient Details Name: Mason Hart MRN: [id number] Date of Birth: 1956-02-24 Today's Date: 01/06/2021 Time: SLP Start Time (ACUTE ONLY): 1100 -SLP Stop Time (ACUTE ONLY): 1115 SLP Time Calculation (min) (ACUTE ONLY): 15 min Past Medical History: Past Medical History: Diagnosis Date . Alcohol abuse  . Hypertension  . Hypothyroidism  . Noncompliance with medication regimen  . RBBB (right bundle branch block)  . Seizures ([facility])  Past Surgical History: Past Surgical History: Procedure Laterality Date . NO PAST SURGERIES   HPI: Pt is a 65 y.o. male with PMH is significant for alcohol use disorder, hypertension, hypothyroidism and seizure disorder. He presented with extremity weakness and altered mental status; admitted with hypotension, rhabdomyolysis, acute on chronic anemia, and sepsis suspicious for pneumonia treating with CAP coverage. Pt loaded with Keppra in ED.  CXR 2/9: No substantial change in the right greater than left basilar airspace disease with small left pleural effusion. Cortrak scheduled to be placed 2/9. MBS 03/20/19: moderate oropharyngeal dysphagia; Oral phase with decreased oral control due to weakess with premature spillage of boluses into pharynx/larynx.  Also laryngeal penetration of thin and nectar apparent due to decreased epiglottic deflection and timing of laryngeal closure.  Mild vallecular residuals present without pt awareness.  A dysphagia 1 diet with thin liquids was recommended at that time with subsequent advancement to regular thin by 03/24/19.  No data recorded Assessment / Plan / Recommendation CHL IP CLINICAL IMPRESSIONS 01/06/2021 Clinical Impression Pt demonstrates a moderate oropharyngeal dysphagia. Oral deficits including decreased bolus cohesion with pooling of liquids around the floor of the mouth and premature spillage, exacerbated by delayed swallow initaition. Though laryngeal elevation and hyoid excursion are good, epiglottic deflection and vestibular closure are impaired by  presence of bony protrusion at C3/4 that blocks deflection and contribues to moderate residue in the valleculae. There are instances of frank penetration in between multiple swallow/subswallow efforts that pt makes with each bolus. Silent penetration accumulates to mild silent trace aspiration if intervention not given. A simple cue for pt to clear throat and swallow again ejects penetrate and clears oral and oropharyngeal resdiue. Thickened liquids and resulted equally in penetration and residue. Recommend pt continue with thin liquids and regular solids though f/u with SLP needed for compensatory strategy training. SLP Visit Diagnosis Dysphagia, unspecified (R13.10) Attention and concentration deficit following -- Frontal lobe and executive function deficit following -- Impact on safety and function Mild aspiration risk   CHL IP TREATMENT RECOMMENDATION 01/06/2021 Treatment Recommendations Therapy as outlined in treatment plan below   Prognosis 01/05/2021 Prognosis for Safe Diet Advancement Good Barriers to Reach Goals Cognitive deficits Barriers/Prognosis Comment -- CHL IP DIET RECOMMENDATION 01/06/2021 SLP Diet Recommendations Regular solids;Thin liquid Liquid Administration via Cup;Straw Medication Administration Whole meds with puree Compensations Slow rate;Small sips/bites;Clear throat after each swallow;Multiple dry swallows after each bite/sip Postural Changes Seated upright at 90 degrees   CHL IP OTHER RECOMMENDATIONS 01/06/2021 Recommended Consults -- Oral Care Recommendations -- Other Recommendations Have oral suction available   CHL IP FOLLOW UP RECOMMENDATIONS 01/06/2021 Follow up Recommendations Skilled Nursing facility   [facility] IP FREQUENCY AND DURATION 01/06/2021 Speech Therapy Frequency (ACUTE ONLY) min 2x/week Treatment Duration 2 weeks      CHL IP ORAL PHASE 01/06/2021 Oral Phase Impaired Oral - Pudding Teaspoon -- Oral - Pudding Cup --  By: Lowella Grip III M.D.   On: 01/06/2021 10:47   DG Swallowing Func-Speech Pathology  Result Date: 01/06/2021 Objective Swallowing Evaluation: Type of Study: MBS-Modified Barium Swallow Study   Patient Details Name: Mason Hart MRN: [id number] Date of Birth: 1956-02-24 Today's Date: 01/06/2021 Time: SLP Start Time (ACUTE ONLY): 1100 -SLP Stop Time (ACUTE ONLY): 1115 SLP Time Calculation (min) (ACUTE ONLY): 15 min Past Medical History: Past Medical History: Diagnosis Date . Alcohol abuse  . Hypertension  . Hypothyroidism  . Noncompliance with medication regimen  . RBBB (right bundle branch block)  . Seizures ([facility])  Past Surgical History: Past Surgical History: Procedure Laterality Date . NO PAST SURGERIES   HPI: Pt is a 65 y.o. male with PMH is significant for alcohol use disorder, hypertension, hypothyroidism and seizure disorder. He presented with extremity weakness and altered mental status; admitted with hypotension, rhabdomyolysis, acute on chronic anemia, and sepsis suspicious for pneumonia treating with CAP coverage. Pt loaded with Keppra in ED.  CXR 2/9: No substantial change in the right greater than left basilar airspace disease with small left pleural effusion. Cortrak scheduled to be placed 2/9. MBS 03/20/19: moderate oropharyngeal dysphagia; Oral phase with decreased oral control due to weakess with premature spillage of boluses into pharynx/larynx.  Also laryngeal penetration of thin and nectar apparent due to decreased epiglottic deflection and timing of laryngeal closure.  Mild vallecular residuals present without pt awareness.  A dysphagia 1 diet with thin liquids was recommended at that time with subsequent advancement to regular thin by 03/24/19.  No data recorded Assessment / Plan / Recommendation CHL IP CLINICAL IMPRESSIONS 01/06/2021 Clinical Impression Pt demonstrates a moderate oropharyngeal dysphagia. Oral deficits including decreased bolus cohesion with pooling of liquids around the floor of the mouth and premature spillage, exacerbated by delayed swallow initaition. Though laryngeal elevation and hyoid excursion are good, epiglottic deflection and vestibular closure are impaired by  presence of bony protrusion at C3/4 that blocks deflection and contribues to moderate residue in the valleculae. There are instances of frank penetration in between multiple swallow/subswallow efforts that pt makes with each bolus. Silent penetration accumulates to mild silent trace aspiration if intervention not given. A simple cue for pt to clear throat and swallow again ejects penetrate and clears oral and oropharyngeal resdiue. Thickened liquids and resulted equally in penetration and residue. Recommend pt continue with thin liquids and regular solids though f/u with SLP needed for compensatory strategy training. SLP Visit Diagnosis Dysphagia, unspecified (R13.10) Attention and concentration deficit following -- Frontal lobe and executive function deficit following -- Impact on safety and function Mild aspiration risk   CHL IP TREATMENT RECOMMENDATION 01/06/2021 Treatment Recommendations Therapy as outlined in treatment plan below   Prognosis 01/05/2021 Prognosis for Safe Diet Advancement Good Barriers to Reach Goals Cognitive deficits Barriers/Prognosis Comment -- CHL IP DIET RECOMMENDATION 01/06/2021 SLP Diet Recommendations Regular solids;Thin liquid Liquid Administration via Cup;Straw Medication Administration Whole meds with puree Compensations Slow rate;Small sips/bites;Clear throat after each swallow;Multiple dry swallows after each bite/sip Postural Changes Seated upright at 90 degrees   CHL IP OTHER RECOMMENDATIONS 01/06/2021 Recommended Consults -- Oral Care Recommendations -- Other Recommendations Have oral suction available   CHL IP FOLLOW UP RECOMMENDATIONS 01/06/2021 Follow up Recommendations Skilled Nursing facility   [facility] IP FREQUENCY AND DURATION 01/06/2021 Speech Therapy Frequency (ACUTE ONLY) min 2x/week Treatment Duration 2 weeks      CHL IP ORAL PHASE 01/06/2021 Oral Phase Impaired Oral - Pudding Teaspoon -- Oral - Pudding Cup --  By: Lowella Grip III M.D.   On: 01/06/2021 10:47   DG Swallowing Func-Speech Pathology  Result Date: 01/06/2021 Objective Swallowing Evaluation: Type of Study: MBS-Modified Barium Swallow Study   Patient Details Name: Mason Hart MRN: [id number] Date of Birth: 1956-02-24 Today's Date: 01/06/2021 Time: SLP Start Time (ACUTE ONLY): 1100 -SLP Stop Time (ACUTE ONLY): 1115 SLP Time Calculation (min) (ACUTE ONLY): 15 min Past Medical History: Past Medical History: Diagnosis Date . Alcohol abuse  . Hypertension  . Hypothyroidism  . Noncompliance with medication regimen  . RBBB (right bundle branch block)  . Seizures ([facility])  Past Surgical History: Past Surgical History: Procedure Laterality Date . NO PAST SURGERIES   HPI: Pt is a 65 y.o. male with PMH is significant for alcohol use disorder, hypertension, hypothyroidism and seizure disorder. He presented with extremity weakness and altered mental status; admitted with hypotension, rhabdomyolysis, acute on chronic anemia, and sepsis suspicious for pneumonia treating with CAP coverage. Pt loaded with Keppra in ED.  CXR 2/9: No substantial change in the right greater than left basilar airspace disease with small left pleural effusion. Cortrak scheduled to be placed 2/9. MBS 03/20/19: moderate oropharyngeal dysphagia; Oral phase with decreased oral control due to weakess with premature spillage of boluses into pharynx/larynx.  Also laryngeal penetration of thin and nectar apparent due to decreased epiglottic deflection and timing of laryngeal closure.  Mild vallecular residuals present without pt awareness.  A dysphagia 1 diet with thin liquids was recommended at that time with subsequent advancement to regular thin by 03/24/19.  No data recorded Assessment / Plan / Recommendation CHL IP CLINICAL IMPRESSIONS 01/06/2021 Clinical Impression Pt demonstrates a moderate oropharyngeal dysphagia. Oral deficits including decreased bolus cohesion with pooling of liquids around the floor of the mouth and premature spillage, exacerbated by delayed swallow initaition. Though laryngeal elevation and hyoid excursion are good, epiglottic deflection and vestibular closure are impaired by  presence of bony protrusion at C3/4 that blocks deflection and contribues to moderate residue in the valleculae. There are instances of frank penetration in between multiple swallow/subswallow efforts that pt makes with each bolus. Silent penetration accumulates to mild silent trace aspiration if intervention not given. A simple cue for pt to clear throat and swallow again ejects penetrate and clears oral and oropharyngeal resdiue. Thickened liquids and resulted equally in penetration and residue. Recommend pt continue with thin liquids and regular solids though f/u with SLP needed for compensatory strategy training. SLP Visit Diagnosis Dysphagia, unspecified (R13.10) Attention and concentration deficit following -- Frontal lobe and executive function deficit following -- Impact on safety and function Mild aspiration risk   CHL IP TREATMENT RECOMMENDATION 01/06/2021 Treatment Recommendations Therapy as outlined in treatment plan below   Prognosis 01/05/2021 Prognosis for Safe Diet Advancement Good Barriers to Reach Goals Cognitive deficits Barriers/Prognosis Comment -- CHL IP DIET RECOMMENDATION 01/06/2021 SLP Diet Recommendations Regular solids;Thin liquid Liquid Administration via Cup;Straw Medication Administration Whole meds with puree Compensations Slow rate;Small sips/bites;Clear throat after each swallow;Multiple dry swallows after each bite/sip Postural Changes Seated upright at 90 degrees   CHL IP OTHER RECOMMENDATIONS 01/06/2021 Recommended Consults -- Oral Care Recommendations -- Other Recommendations Have oral suction available   CHL IP FOLLOW UP RECOMMENDATIONS 01/06/2021 Follow up Recommendations Skilled Nursing facility   [facility] IP FREQUENCY AND DURATION 01/06/2021 Speech Therapy Frequency (ACUTE ONLY) min 2x/week Treatment Duration 2 weeks      CHL IP ORAL PHASE 01/06/2021 Oral Phase Impaired Oral - Pudding Teaspoon -- Oral - Pudding Cup --

## 2021-01-06 NOTE — Progress Notes (Signed)
[facility] ADULT ICU REPLACEMENT PROTOCOL   The patient does apply for the [facility] Adult ICU Electrolyte Replacment Protocol based on the criteria listed below:   1. Is GFR >/= 30 ml/min? Yes.    Patient's GFR today is >60 2. Is SCr </= 2? Yes.   Patient's SCr is 1.07 ml/kg/hr 3. Did SCr increase >/= 0.5 in 24 hours? No. 4. Abnormal electrolyte(s): k 3.2 5. Ordered repletion with: protocol 6. If a panic level lab has been reported, has the CCM MD in charge been notified? No..   Physician:    Markus Daft A 01/06/2021 3:07 AM

## 2021-01-06 NOTE — NC FL2 (Signed)
[place] MEDICAID FL2 LEVEL OF CARE SCREENING TOOL     IDENTIFICATION  Patient Name: Mason Hart Birthdate: August 06, 1956 Sex: male Admission Date (Current Location): 01/02/2021  [facility] and [place] Number:  Producer, television/film/video and Address:  [facility] [place], [address], [place], [place] [address]      Provider Number: [id number]  Attending Physician Name and Address:  Billey Co, MD  Relative Name and Phone Number:  Mateo Flow, [phone]    Current Level of Care: Hospital Recommended Level of Care: Skilled Nursing Facility Prior Approval Number:    Date Approved/Denied:   PASRR Number: [id number]  Discharge Plan: SNF    Current Diagnoses: Patient Active Problem List   Diagnosis Date Noted  . Protein-calorie malnutrition, severe 01/05/2021  . Hypothyroidism 01/04/2021  . Hypoxia 01/04/2021  . Volume overload 01/04/2021  . Diastolic dysfunction 01/04/2021  . Community acquired pneumonia of right lung   . Hypernatremia   . Thrombocytopenia (HCC)   . Weakness   . SOB (shortness of breath)   . Altered mental status 01/02/2021  . Lactic acidosis 10/21/2020  . Metabolic acidosis 10/21/2020  . Hepatic cirrhosis (HCC) 05/26/2020  . Seizure disorder (HCC) 04/29/2020  . Gait abnormality 04/29/2020  . Acute respiratory failure (HCC) 04/13/2020  . Alcohol intoxication with delirium (HCC) 06/04/2019  . Pressure injury of skin 03/19/2019  . Breakthrough seizure (HCC) 03/18/2019  . Edema of left lower extremity 03/18/2019  . Symptomatic anemia 03/18/2019  . Anemia, normocytic normochromic 12/05/2018  . Endotracheally intubated 12/05/2018  . Hypothermia 12/05/2018  . Hyponatremia 12/05/2018  . Postictal state (HCC) 12/05/2018  . Sinusitis 12/05/2018  . Abnormal chest x-ray 12/05/2018  . Hypotension 12/05/2018  . Bradycardia 12/05/2018  . ARF (acute renal failure) (HCC) 11/14/2018  . Clavicle fracture 11/14/2018  . Hypokalemia  06/24/2017  . Hypoglycemia 06/24/2017  . Alcohol use 06/24/2017  . Seizure (HCC) 06/24/2017  . Noncompliance with medication regimen   . Seizures (HCC)     Orientation RESPIRATION BLADDER Height & Weight     Self,Time,Situation,Place  Normal External catheter,Incontinent Weight: 211 lb 10.3 oz (96 kg) Height:  6\' 1"  (185.4 cm)  BEHAVIORAL SYMPTOMS/MOOD NEUROLOGICAL BOWEL NUTRITION STATUS      Incontinent Diet (See DC Summary)  AMBULATORY STATUS COMMUNICATION OF NEEDS Skin   Extensive Assist Verbally Skin abrasions (R and L lower buttocks abrassions w/ foam dressing)                       Personal Care Assistance Level of Assistance  Bathing,Feeding,Dressing Bathing Assistance: Maximum assistance Feeding assistance: Limited assistance Dressing Assistance: Maximum assistance     Functional Limitations Info  Sight,Hearing,Speech Sight Info: Adequate Hearing Info: Adequate Speech Info: Adequate    SPECIAL CARE FACTORS FREQUENCY  PT (By licensed PT),OT (By licensed OT)     PT Frequency: 5x week OT Frequency: 5x week            Contractures Contractures Info: Not present    Additional Factors Info  Code Status,Allergies Code Status Info: Full Allergies Info: NKA           Current Medications (01/06/2021):  This is the current hospital active medication list Current Facility-Administered Medications  Medication Dose Route Frequency Provider Last Rate Last Admin  . 0.9 %  sodium chloride infusion  250 mL Intravenous Continuous 03/06/2021 B, NP   Stopped at 01/05/21 1416  . acetaminophen (TYLENOL) tablet 650  mg  650 mg Oral Q6H PRN Ganta, Anupa, DO       Or  . acetaminophen (TYLENOL) suppository 650 mg  650 mg Rectal Q6H PRN Ganta, Anupa, DO      . chlorhexidine (PERIDEX) 0.12 % solution 15 mL  15 mL Mouth Rinse BID Hunsucker, Lesia Sago, MD   15 mL at 01/06/21 0936  . Chlorhexidine Gluconate Cloth 2 % PADS 6 each  6 each Topical Daily Hunsucker, Lesia Sago,  MD   6 each at 01/05/21 1304  . dextrose 10 % infusion   Intravenous Continuous Karl Ito, MD 30 mL/hr at 01/06/21 1000 Infusion Verify at 01/06/21 1000  . diphenhydrAMINE-zinc acetate (BENADRYL) 2-0.1 % cream   Topical TID PRN Ganta, Anupa, DO      . enoxaparin (LOVENOX) injection 40 mg  40 mg Subcutaneous Q24H Charlott Holler, MD   40 mg at 01/06/21 0936  . feeding supplement (ENSURE ENLIVE / ENSURE PLUS) liquid 237 mL  237 mL Oral TID BM Charlott Holler, MD   237 mL at 01/06/21 0937  . folic acid (FOLVITE) tablet 1 mg  1 mg Oral Daily Ganta, Anupa, DO   1 mg at 01/06/21 0933  . levETIRAcetam (KEPPRA) IVPB 1000 mg/100 mL premix  1,000 mg Intravenous Q12H Maury Dus, MD   Paused at 01/06/21 0518  . MEDLINE mouth rinse  15 mL Mouth Rinse q12n4p Hunsucker, Lesia Sago, MD   15 mL at 01/05/21 1304  . multivitamin with minerals tablet 1 tablet  1 tablet Oral Daily Ganta, Anupa, DO   1 tablet at 01/06/21 0933  . [START ON 01/07/2021] pantoprazole (PROTONIX) EC tablet 40 mg  40 mg Oral Daily Maury Dus, MD      . thiamine tablet 100 mg  100 mg Oral Daily Ganta, Anupa, DO       Or  . thiamine (B-1) injection 100 mg  100 mg Intravenous Daily Ganta, Anupa, DO   100 mg at 01/06/21 4656     Discharge Medications: Please see discharge summary for a list of discharge medications.  Relevant Imaging Results:  Relevant Lab Results:   Additional Information SSN: [id number], [place]

## 2021-01-06 NOTE — Progress Notes (Addendum)
Daily Progress Note   Patient Name: Mason Hart       Date: 01/06/2021 DOB: February 12, 1956  Age: 65 y.o. MRN#: [id number] Attending Physician: Mason Co, MD Primary Care Physician: Mason Rigg, NP Admit Date: 01/02/2021  Reason for Consultation/Follow-up: Establishing goals of care  Subjective: Chart review performed. Received report from Mason Hart - reports levo was stopped last night, bair hugger was turned off, stable vital signs, and patient ate 100% of breakfast. Received report from primary RN - no acute concerns. RN states patient remains very sleepy.  Went to visit patient at bedside - no family/visitors present. Patient is awake, alert, oriented, and able to participate in conversation. He denies pain or shortness of breath. No signs or non-verbal gestures of pain or discomfort noted. No respiratory distress, increased work of breathing, or secretions noted. He seems less congested today.  Patient states that he feels "alright" today. He does not seem to be a man to complain and seems very easy going. I told him I heard he had eaten all of his breakfast and he smiled - I asked if he enjoyed it and he said it was "alright" for hospital food. I encourage him to continue his oral intake for nutrition and expressed joy that he was eating as it is less likely he will need a feeding tube - the patient was happy to hear this. He states that he feels better today than yesterday and also feels his congestion is better. We discussed plan for him to discharge to a rehab facility - patient is ok with rehab and willing to work with PT/OT to gain strength.   I provided education and discussion around completing HCPOA while in house. Mason Hart has previously stated that he is the patient's financial POA  but not HCPOA - patient confirms this. I educated that document only takes effect if he is unable to speak for himself - explained we would want to approach the person he trusts the most to make medical decisions on his behalf if he were unable to speak for himself. I asked if there was someone he trusted and would want to make medical decisions for him if he could not speak - patient stated "I don't trust anyone" and "I make my own decisions." I reviewed the events of his hospitalization and  reminded him that several days ago, he could not speak for himself and this may happen again in the future. He stated, "I do not plan on that happening," to which I responded that he didn't plan on this event happening either and we never plan on these things to happen - he stated "you're right." I explained what we can plan on, is put things in place to support him if/when it ever did happen again. I recommended he complete HCPOA. I explained legal basis of order for medical decision makers if document was not completed., which would be his wife (whom has her own medical issues) and then all of his children. He still did not want to complete document but states he would think about it. I asked if he had someone in mind he trusted to be a Management consultant, he stated no.  I then introduced, reviewed, and completed MOST form with patient as outlined under Recommendation section below. Once form was completed patient stated he did not have his glasses to review the form and wanted to review it with his son/Mason Hart. I left the form at beside for their review. He was agreeable for PMT to return tomorrow for completion and would let us know if he decided to complete HCPOA.  1:58 PM Spoke with son/Mason Hart and provided updates as outlined per Mason Hart above. Mason Hart expresses joy that Mason Hart continues to improve. I reviewed with Mason Hart I recommended patient complete HCPOA while in house with understanding he is not close with some of  his children -  I explained to Mason Hart legal basis of order for medical decision makers if document was not completed. I also reviewed that patient and I had completed MOST form and it was left at bedside for their review - I requested he bring the patient's glasses if able. Mason Hart is familiar with what a MOST form is because one was previously completed for his mother/the patient's wife. Mason Hart will be stopping by to visit with the patient his afternoon and states he will review MOST with patient and also speak with him about getting HCPOA completed. I stated PMT would follow up tomorrow to complete MOST and assist with HCPOA if patient was interested. Mason Hart expressed appreciation for call and updates.  All questions and concerns addressed. Encouraged to call with questions and/or concerns. PMT number previosuly provided.  Length of Stay: 4  Current Medications: Scheduled Meds:  . chlorhexidine  15 mL Mouth Rinse BID  . Chlorhexidine Gluconate Cloth  6 each Topical Daily  . enoxaparin (LOVENOX) injection  40 mg Subcutaneous Q24H  . feeding supplement  237 mL Oral TID BM  . folic acid  1 mg Oral Daily  . mouth rinse  15 mL Mouth Rinse q12n4p  . multivitamin with minerals  1 tablet Oral Daily  . [START ON 01/07/2021] pantoprazole  40 mg Oral Daily  . thiamine  100 mg Oral Daily   Or  . thiamine  100 mg Intravenous Daily    Continuous Infusions: . sodium chloride Stopped (01/05/21 1416)  . dextrose 30 mL/hr at 01/06/21 1100  . levETIRAcetam Stopped (01/06/21 0518)    PRN Meds: acetaminophen **OR** acetaminophen, diphenhydrAMINE-zinc acetate  Physical Exam Vitals and nursing note reviewed.  Constitutional:      General: He is not in acute distress.    Appearance: He is ill-appearing.  Pulmonary:     Effort: No respiratory distress.  Musculoskeletal:     Right upper arm: Swelling present.  Left upper arm: Swelling present.  Skin:    General: Skin is warm and dry.  Neurological:      Mental Status: He is alert and oriented to person, place, and time.     Motor: Weakness present.  Psychiatric:        Attention and Perception: Attention normal.        Behavior: Behavior is cooperative.        Cognition and Memory: Cognition and memory normal.             Vital Signs: BP (!) 110/50   Pulse 69   Temp 97.9 F (36.6 C) (Oral)   Resp 17   Ht 6\' 1"  (1.854 m)   Wt 96 kg   SpO2 100%   BMI 27.92 kg/m  SpO2: SpO2: 100 % O2 Device: O2 Device: Room Air O2 Flow Rate: O2 Flow Rate (L/min): 2 L/min  Intake/output summary:   Intake/Output Summary (Last 24 hours) at 01/06/2021 1156 Last data filed at 01/06/2021 1100 Gross per 24 hour  Intake 1594.75 ml  Output 285 ml  Net 1309.75 ml   LBM: Last BM Date: 01/05/21 Baseline Weight: Weight: 91.9 kg Most recent weight: Weight: 96 kg       Palliative Assessment/Data: PPS 40%      Patient Active Problem List   Diagnosis Date Noted  . Protein-calorie malnutrition, severe 01/05/2021  . Hypothyroidism 01/04/2021  . Hypoxia 01/04/2021  . Volume overload 01/04/2021  . Diastolic dysfunction 01/04/2021  . Community acquired pneumonia of right lung   . Hypernatremia   . Thrombocytopenia (HCC)   . Weakness   . SOB (shortness of breath)   . Altered mental status 01/02/2021  . Lactic acidosis 10/21/2020  . Metabolic acidosis 10/21/2020  . Hepatic cirrhosis (HCC) 05/26/2020  . Seizure disorder (HCC) 04/29/2020  . Gait abnormality 04/29/2020  . Acute respiratory failure (HCC) 04/13/2020  . Alcohol intoxication with delirium (HCC) 06/04/2019  . Pressure injury of skin 03/19/2019  . Breakthrough seizure (HCC) 03/18/2019  . Edema of left lower extremity 03/18/2019  . Symptomatic anemia 03/18/2019  . Anemia, normocytic normochromic 12/05/2018  . Endotracheally intubated 12/05/2018  . Hypothermia 12/05/2018  . Hyponatremia 12/05/2018  . Postictal state (HCC) 12/05/2018  . Sinusitis 12/05/2018  . Abnormal chest x-ray  12/05/2018  . Hypotension 12/05/2018  . Bradycardia 12/05/2018  . ARF (acute renal failure) (HCC) 11/14/2018  . Clavicle fracture 11/14/2018  . Hypokalemia 06/24/2017  . Hypoglycemia 06/24/2017  . Alcohol use 06/24/2017  . Seizure (HCC) 06/24/2017  . Noncompliance with medication regimen   . Seizures [facility])     Palliative Care Assessment & Plan   Patient Profile: 65 y.o.malewith past medical history of seizures and ETOH abuse presented to the ED from home after an unwitnessed fall and unknown downtime. Patient wasadmitted on 2/6/2022with AMS, sepsis of unknown source likely CAP, rhabdomyolysis, weakness/fall, acute on chronic anemia, and protein calorie malnutrition (albumin noted at 1.8 on 01/03/21).  Of note, patient does have social concerns of poor living conditions, which include roaches and bedbug infestation.  Assessment: Hypovolemia Hypoglycemia Shock Encephalopathy Anemia History of seizures ETOH abuse Weakness/fall Protein calorie malnutrition  Recommendations/Plan:  Continue current medical treatment  Continue full code status as previously documented  Patient agreeable for discharge to rehab facility  [facility] consulted for: outpatient Palliative Care referral  Recommended patient complete HCPOA - he is considering   MOST form completed with patient (but not finalized) as follows: Attempt Resuscitation, Full Scope of Treatment,  Determine use or limitation of antibiotics when infection occurs, IV fluids for a defined trial period, Feeding tube for a defined trial period. Unsigned form left at bedside.  PMT will follow up tomorrow 2/10 to finalize MOST form and assist with coordinating completion of HCPOA if patient desires  PMT will continue to follow and support holistically  Goals of Care and Additional Recommendations:  Limitations on Scope of Treatment: Full Scope Treatment  Code Status:    Code Status Orders  (From admission, onward)          Start     Ordered   01/02/21 2258  Full code  Continuous        01/02/21 2343        Code Status History    Date Active Date Inactive Code Status Order ID Comments User Context   10/21/2020 0434 10/24/2020 2240 Full Code [id number]  Lejon Giovanni, MD ED   03/18/2019 2223 03/25/2019 2009 Full Code [id number]  Dquan Giovanni, MD ED   12/04/2018 2147 12/18/2018 2337 Full Code [id number]  Kathlene Cote, PA-C ED   11/14/2018 0439 11/19/2018 1611 Full Code [id number]  Eduard Clos, MD ED   06/24/2017 2226 06/27/2017 1735 Full Code [id number]  Lorretta Harp, MD ED   Advance Care Planning Activity       Prognosis:   Unable to determine  Discharge Planning:  Skilled Nursing Facility for rehab with Palliative care service follow-up  Care plan was discussed with patient, patient's son, Mason Hart, primary RN  Thank you for allowing the Palliative Medicine Team to assist in the care of this patient.   Total Time 65 minutes Prolonged Time Billed  no       Greater than 50%  of this time was spent counseling and coordinating care related to the above assessment and plan.  Haskel Khan, NP  Please contact Palliative Medicine Team phone at [phone] for questions and concerns.

## 2021-01-07 ENCOUNTER — Inpatient Hospital Stay (HOSPITAL_COMMUNITY): Payer: Medicare Other

## 2021-01-07 DIAGNOSIS — J189 Pneumonia, unspecified organism: Secondary | ICD-10-CM | POA: Diagnosis not present

## 2021-01-07 DIAGNOSIS — R531 Weakness: Secondary | ICD-10-CM | POA: Diagnosis not present

## 2021-01-07 DIAGNOSIS — D649 Anemia, unspecified: Secondary | ICD-10-CM | POA: Diagnosis not present

## 2021-01-07 DIAGNOSIS — R9389 Abnormal findings on diagnostic imaging of other specified body structures: Secondary | ICD-10-CM | POA: Diagnosis not present

## 2021-01-07 DIAGNOSIS — J9601 Acute respiratory failure with hypoxia: Secondary | ICD-10-CM | POA: Diagnosis not present

## 2021-01-07 LAB — MAGNESIUM: Magnesium: 2.2 mg/dL (ref 1.7–2.4)

## 2021-01-07 LAB — BASIC METABOLIC PANEL
Anion gap: 5 (ref 5–15)
BUN: 16 mg/dL (ref 8–23)
CO2: 25 mmol/L (ref 22–32)
Calcium: 8 mg/dL — ABNORMAL LOW (ref 8.9–10.3)
Chloride: 116 mmol/L — ABNORMAL HIGH (ref 98–111)
Creatinine, Ser: 1.12 mg/dL (ref 0.61–1.24)
GFR, Estimated: >60 mL/min (ref >60)
Glucose, Bld: 79 mg/dL (ref 70–99)
Potassium: 3.4 mmol/L — ABNORMAL LOW (ref 3.5–5.1)
Sodium: 146 mmol/L — ABNORMAL HIGH (ref 135–145)

## 2021-01-07 LAB — CBC
HCT: 29.3 % — ABNORMAL LOW (ref 39.0–52.0)
Hemoglobin: 8.7 g/dL — ABNORMAL LOW (ref 13.0–17.0)
MCH: 26 pg (ref 26.0–34.0)
MCHC: 29.7 g/dL — ABNORMAL LOW (ref 30.0–36.0)
MCV: 87.5 fL (ref 80.0–100.0)
Platelets: 78 10*3/uL — ABNORMAL LOW (ref 150–400)
RBC: 3.35 MIL/uL — ABNORMAL LOW (ref 4.22–5.81)
RDW: 21.7 % — ABNORMAL HIGH (ref 11.5–15.5)
WBC: 9.9 10*3/uL (ref 4.0–10.5)
nRBC: 0.8 % — ABNORMAL HIGH (ref 0.0–0.2)

## 2021-01-07 LAB — GLUCOSE, CAPILLARY
Glucose-Capillary: 66 mg/dL — ABNORMAL LOW (ref 70–99)
Glucose-Capillary: 87 mg/dL (ref 70–99)
Glucose-Capillary: 66 mg/dL — ABNORMAL LOW (ref 70–99)
Glucose-Capillary: 76 mg/dL (ref 70–99)
Glucose-Capillary: 64 mg/dL — ABNORMAL LOW (ref 70–99)
Glucose-Capillary: 76 mg/dL (ref 70–99)
Glucose-Capillary: 109 mg/dL — ABNORMAL HIGH (ref 70–99)
Glucose-Capillary: 89 mg/dL (ref 70–99)
Glucose-Capillary: 84 mg/dL (ref 70–99)
Glucose-Capillary: 90 mg/dL (ref 70–99)

## 2021-01-07 LAB — SAVE SMEAR(SSMR), FOR PROVIDER SLIDE REVIEW

## 2021-01-07 LAB — LACTATE DEHYDROGENASE: LDH: 134 U/L (ref 98–192)

## 2021-01-07 LAB — HEPATITIS PANEL, ACUTE
HCV Ab: NONREACTIVE
Hep A IgM: NONREACTIVE
Hep B C IgM: NONREACTIVE
Hepatitis B Surface Ag: NONREACTIVE

## 2021-01-07 LAB — PHOSPHORUS: Phosphorus: 2.7 mg/dL (ref 2.5–4.6)

## 2021-01-07 MED ORDER — DEXTROSE 50 % IV SOLN
12.5000 g | INTRAVENOUS | Status: AC
  Administered 2021-01-07: 12.5 g via INTRAVENOUS

## 2021-01-07 MED ORDER — POTASSIUM CHLORIDE 20 MEQ PO PACK
40.0000 meq | PACK | Freq: Once | ORAL | Status: AC
  Administered 2021-01-07: 40 meq via ORAL
  Filled 2021-01-07: qty 2

## 2021-01-07 NOTE — Progress Notes (Signed)
Family Medicine Teaching Service Daily Progress Note Intern Pager: [phone]  Patient name: Mason Hart Medical record number: [id number] Date of birth: 1956-02-14 Age: 65 y.o. Gender: male  Primary Care Provider: [name], NP Consultants: CCM, palliative, heme-onc Code Status: Full  Pt Overview and Major Events to Date:  2/6: admitted 2/8: transferred to ICU  Assessment and Plan:  Mason Hart a 65 y.o.malewho presented with altered mental status after being found down. PMH is significant foralcohol use disorder, hypertension, hypothyroidism and seizure disorder.  Hypoglycemia BG 66-87 overnight after stopping D10. He was given 1/2 amp D50 for the glucose reading of 66. This morning POC glucose is still 64. Patient asymptomatic. No hx of diabetes, not on insulin. Had adequate PO intake yesterday (ate 100% of breakfast and lunch, ate 100% of breakfast this morning) -Will obtain MRI brain, hepatitis panel to look for alternate cause of hypoglycemia/hypothermia -Consider endocrine consult if persistently hypoglycemic despite good intake -CBG monitoring q4h -Monitor PO intake  Temperature Instability Patient initially maintained adequate temperatures off the bair hugger yesterday with temp range 97.4-98.7. However, most recent temp this morning 96.1. -Will obtain MRI brain and hepatitis panel to look for alternate causes of hypothermia/hypoglycemia -Continue to monitor temperature closely  Acute on Chronic Normocytic Anemia  Thrombocytopenia Hgb remains stable at 8.7 this morning, platelets stable as well (78 today). Etiology likely multifactorial (alcohol use, acute illness, seizure medication, nutritional deficiency). Anemia panel c/w anemia of chronic disease. No evidence of DIC on DIC panel. No schistocytes seen on repeat peripheral smear (initial schistocytes likely 2/2 transfusion).  -Heme-onc consulted, appreciate recommendations -f/u homocysteine/MMA, copper and  haptoglobin per heme-onc recs -Outpatient GI f/u -Daily CBC -Transfusion threshold <7.0  Acute Encephalopathy- resolved Appears to be at baseline again today. Etiology likely multifactorial 2/2 substance use, ?seizure, hypothermia. -Monitor mental status -PT/OT recommending SNF -SW consulted for SNF placement  Hypotension- improved Patient has been maintaining BP for the past 24hrs off levophed. BP range 93-125 systolic. -Continue to monitor BP  Hypernatremia  Hypokalemia Nearly resolved- Na 146 this morning. K 3.4. -Will give klor-con x1 -Daily BMP -Encourage PO intake  Seizure Disorder Chronic, stable. No seizure activity since admission. -Continue Keppra 1000mg  BID  Alcohol Use Disorder Alcohol level <10 on admission (2/6). -CIWA monitoring -Daily thiamine, folic acid, multivitamin  Protein Calorie Malnutrition Nutrition following. -Regular diet -Ensure enlive supplementation TID  FEN/GI: Regular diet PPx: Lovenox   Status is: Inpatient Remains inpatient appropriate because:Ongoing diagnostic testing needed not appropriate for outpatient work up   Dispo: The patient is from: Home              Anticipated d/c is to: SNF              Anticipated d/c date is: 2 days              Patient currently is not medically stable to d/c.   Difficult to place patient No    Subjective:  Patient denies complaints this morning. He is sitting in bed eating breakfast without assistance.  Objective: Temp:  [97.4 F (36.3 C)-98.7 F (37.1 C)] 97.4 F (36.3 C) (02/11 0340) Pulse Rate:  [51-83] 57 (02/11 0500) Resp:  [12-21] 12 (02/11 0500) BP: (93-131)/(46-75) 118/63 (02/11 0500) SpO2:  [91 %-100 %] 100 % (02/11 0500) Weight:  [96.6 kg] 96.6 kg (02/11 0100) Physical Exam: General: alert, sitting upright in bed eating breakfast Cardiovascular: RRR, normal S1/S2 without m/r/g Respiratory: normal WOB, lungs CTAB Extremities: 2-3+  edema of bilateral upper  extremities and left lower extremity Derm: diffuse dry skin of extremities with peeling of bilateral legs Neuro: alert, oriented x3, grossly intact  Laboratory: Recent Labs  Lab 01/05/21 0436 01/06/21 0055 01/06/21 1249 01/07/21 0249  WBC 9.9 8.5  --  9.9  HGB 8.0* 8.7*  --  8.7*  HCT 24.8* 27.0*  --  29.3*  PLT QNS 68* 67* 78*   Recent Labs  Lab 01/02/21 1728 01/03/21 0153 01/03/21 0337 01/03/21 0800 01/05/21 0436 01/06/21 0055 01/07/21 0249  NA 150*   < > 151*   < > 154* 148* 146*  K 3.7   < > 2.9*   < > 3.4* 3.2* 3.4*  CL 118*   < > 118*   < > 124* 119* 116*  CO2 22   < > 22   < > 21* 21* 25  BUN 27*   < > 23   < > 14 15 16   CREATININE 1.16   < > 1.01   < > 1.12 1.07 1.12  CALCIUM 8.4*   < > 8.4*   < > 7.5* 7.7* 8.0*  PROT 6.1*  --  5.7*  --  4.7*  --   --   BILITOT 0.7  --  0.6  --  0.4  --   --   ALKPHOS 59  --  63  --  42  --   --   ALT 28  --  24  --  16  --   --   AST 59*  --  40  --  23  --   --   GLUCOSE 115*   < > 88   < > 94 89 79   < > = values in this interval not displayed.    Imaging/Diagnostic Tests: DG Shoulder Left Result Date: 01/06/2021 IMPRESSION: Generalized osteoarthritic change. Superior migration of the humeral head likely reflects chronic rotator cuff tear. No acute fracture or dislocation. Prior fracture mid left clavicle with remodeling.     03/06/2021, MD 01/07/2021, 6:36 AM PGY-1, [facility] Family Medicine FPTS Intern pager: [phone], text pages welcome

## 2021-01-07 NOTE — Progress Notes (Signed)
Physical Therapy Treatment Patient Details Name: Mason Hart MRN: [id number] DOB: 10/03/1956 Today's Date: 01/07/2021    History of Present Illness 65 y.o. man admitted on 2/6/22with AMS, likely from sepsis of unknown source, CXR suspicious for bil opacity, rhabdomyolysis, acute on chronic anemia, seizure disorder with history of non- compliance. Other significant PMH includes RBBB, HTN, ETOH abuse.    PT Comments    Pt very pleasant finishing breakfast on arrival. Pt with cognitive deficits in memory and orientation noted with significant improvement in mobility. Pt lives with roommate who is not available all the time and to return home pt would need to be capable of completing ADLs and increased mobility. Will continue to follow to progress function in hopes of progression to home with SNF still currently recommended based on today's session.  Pt encouraged to be OOB for meals and to bathroom with staff.     Follow Up Recommendations  SNF;Supervision for mobility/OOB;Home health PT (if able to achieve ADLs home is possible)     Equipment Recommendations  3in1 (PT)    Recommendations for Other Services       Precautions / Restrictions Precautions Precautions: Fall;Other (comment) Precaution Comments: flaking skin, bil UE edema and LLE edema    Mobility  Bed Mobility Overal bed mobility: Needs Assistance Bed Mobility: Supine to Sit     Supine to sit: Min guard;HOB elevated     General bed mobility comments: HOB 45 degrees for pivot to right side of bed with rail    Transfers Overall transfer level: Needs assistance   Transfers: Sit to/from Stand Sit to Stand: Min assist         General transfer comment: cues for hand placement, min assist to stand from slightly elevated bed  Ambulation/Gait Ambulation/Gait assistance: Min guard Gait Distance (Feet): 150 Feet Assistive device: Rolling walker (2 wheeled) Gait Pattern/deviations: Trunk flexed;Wide base of  support   Gait velocity interpretation: 1.31 - 2.62 ft/sec, indicative of limited community ambulator General Gait Details: cues for posture, direction and position in RW, chair follow but not needed next session   Stairs             Wheelchair Mobility    Modified Rankin (Stroke Patients Only)       Balance Overall balance assessment: Needs assistance Sitting-balance support: No upper extremity supported;Feet supported Sitting balance-Leahy Scale: Good     Standing balance support: Bilateral upper extremity supported Standing balance-Leahy Scale: Poor Standing balance comment: needs external support in standing.                            Cognition Arousal/Alertness: Awake/alert Behavior During Therapy: WFL for tasks assessed/performed Overall Cognitive Status: No family/caregiver present to determine baseline cognitive functioning Area of Impairment: Orientation;Memory                 Orientation Level: Disoriented to;Place;Time   Memory: Decreased short-term memory         General Comments: decreased recall of room number stating he is at [facility]      Exercises General Exercises - Lower Extremity Long Arc Quad: AROM;Both;Seated;20 reps Hip Flexion/Marching: AROM;Both;Seated;20 reps    General Comments        Pertinent Vitals/Pain Pain Assessment: No/denies pain    Home Living                      Prior Function  PT Goals (current goals can now be found in the care plan section) Progress towards PT goals: Progressing toward goals    Frequency    Min 3X/week      PT Plan Current plan remains appropriate    Co-evaluation              AM-PAC PT "6 Clicks" Mobility   Outcome Measure  Help needed turning from your back to your side while in a flat bed without using bedrails?: A Little Help needed moving from lying on your back to sitting on the side of a flat bed without using bedrails?: A  Little Help needed moving to and from a bed to a chair (including a wheelchair)?: A Little Help needed standing up from a chair using your arms (e.g., wheelchair or bedside chair)?: A Little Help needed to walk in hospital room?: A Little Help needed climbing 3-5 steps with a railing? : A Lot 6 Click Score: 17    End of Session Equipment Utilized During Treatment: Gait belt Activity Tolerance: Patient tolerated treatment well Patient left: in chair;with call bell/phone within reach;with chair alarm set Nurse Communication: Mobility status PT Visit Diagnosis: Muscle weakness (generalized) (M62.81);Difficulty in walking, not elsewhere classified (R26.2)     Time: 1610-9604 PT Time Calculation (min) (ACUTE ONLY): 23 min  Charges:  $Gait Training: 8-22 mins $Therapeutic Exercise: 8-22 mins                     Atoya Andrew P, PT Acute Rehabilitation Services Pager: [phone] Office: [phone]    Allen Basista B Kristiane Morsch 01/07/2021, 9:01 AM

## 2021-01-07 NOTE — Hospital Course (Addendum)
Mason Hart is a 65 y.o. male who presented with altered mental status after being found down. PMH is significant for alcohol use disorder, hypertension, hypothyroidism and seizure disorder.  Altered Mental Status  Sepsis- ruled out Patient presented with acute encephalopathy. CT head was unremarkable. He was treated with antibiotics (Ceftriaxone and Azithromycin, then broadened to Unasyn) due to concern for sepsis and possible PNA on CXR. However, procal was low at <0.10 so abx were discontinued on 2/8. His mental status gradually improved and he returned to baseline. Etiology ultimately thought to be multifactorial secondary to alcohol use, possible seizure, hypothermia, and hypoglycemia.  Hypotension Patient was hypotensive with MAPs in the 60s on admission. His BP did not improve despite aggressive fluid resuscitation (~8L and 3u pRBCs). He required levophed in the ICU x2 days but was subsequently able to maintain adequate pressures.   Hypothermia Patient noted to have temperature of 87.7 on admission. It improved gradually with warm IV fluids and bair hugger. Not related to environmental exposure. Adrenal insufficiency ruled out with normal am cortisol. Thyroid studies wnl. MRI brain unremarkable. He was able to maintain temp between 97-98*F for several days prior to discharge.  Acute on Chronic Normocytic Anemia Patient presented with hemoglobin 5.9. He was given 3u pRBCs and hemoglobin subsequently stabilized ~8.7. Heme-onc was consulted due to schistocytes on peripheral smear. Ultimately the etiology of his anemia thought to be multifactorial 2/2 alcohol use, critical illness, seizure medication, and nutrition deficiency.  Hypoglycemia Patient's POC glucose checks were intermittently low despite adequate PO intake. Endocrinology was curbsided on 2/14 and felt his fingerstick glucoses were not reflective of his true glucose levels. ***

## 2021-01-07 NOTE — Progress Notes (Addendum)
Hypoglycemic Event  CBG: 66  Treatment: 8 oz juice/soda  Symptoms: None  Follow-up CBG: Time:0522 CBG Result:76  Possible Reasons for Event: Inadequate meal intake  Comments/MD notified:Elink notified, MD ordered to stop checking CBGs for now, but continue every 4 hours.    Sidonie Dickens

## 2021-01-07 NOTE — Progress Notes (Addendum)
Daily Progress Note   Patient Name: Mason Hart       Date: 01/07/2021 DOB: [birth year]  Age: 65 y.o. MRN#: [id number] Attending Physician: Billey Co, MD Primary Care Physician: [name], NP Admit Date: 01/02/2021  Reason for Follow-up: continued GOC discussion  Subjective: Received report from primary RN. Patient is eating well and able to feed himself today. He also walked around the unit today, although remains very weak and requiring 2 person assist.   At bedside, I reviewed with Mason Hart the MOST form which had been started the previous day. Confirmed that all sections were correct and reflected his goals of care.   The patient outlined his wishes for the following treatment decisions:  Cardiopulmonary Resuscitation: Attempt Resuscitation (CPR)  Medical Interventions: Full Scope of Treatment: Use intubation, advanced airway interventions, mechanical ventilation, cardioversion as indicated, medical treatment, IV fluids, etc, also provide comfort measures. Transfer to the hospital if indicated  Antibiotics: Determine use of limitation of antibiotics when infection occurs  IV Fluids: IV fluids for a defined trial period  Feeding Tube: Feeding tube for a defined trial period   Again discussed the issue of HCPOA with patient. He states he doesn't want anyone to make his medical decisions. I provided education that an HCPOA only takes effect if he is unable to make those decisions for himself. Provided education that by default, his wife Mason Hart would be HCPOA unless he designated otherwise.   18:30--I spoke with son Mason Hart by phone. He shares that he discussed the issue of HCPOA with his father last night, and patient was agreeable for Mason Hart to be his HCPOA. He explains he is  full POA for his mother (patient's wife), and has been her caregiver since she had a stroke several years ago. I offered to meet in the room tomorrow so we can explain this to patient together.   Length of Stay: 5  Current Medications: Scheduled Meds:  . chlorhexidine  15 mL Mouth Rinse BID  . Chlorhexidine Gluconate Cloth  6 each Topical Daily  . enoxaparin (LOVENOX) injection  40 mg Subcutaneous Q24H  . feeding supplement  237 mL Oral TID BM  . folic acid  1 mg Oral Daily  . levETIRAcetam  1,000 mg Oral BID  . mouth rinse  15 mL Mouth Rinse q12n4p  .  multivitamin with minerals  1 tablet Oral Daily  . pantoprazole  40 mg Oral Daily  . thiamine  100 mg Oral Daily   Or  . thiamine  100 mg Intravenous Daily    Continuous Infusions: . sodium chloride Stopped (01/05/21 1416)    PRN Meds: acetaminophen **OR** acetaminophen, diphenhydrAMINE-zinc acetate  Physical Exam Constitutional:      General: He is not in acute distress.    Appearance: He is ill-appearing.  Pulmonary:     Effort: Pulmonary effort is normal.  Skin:    Comments: Dry flaky skin  Neurological:     Mental Status: He is alert and oriented to person, place, and time.     Motor: Weakness present.             Vital Signs: BP (!) 148/74 (BP Location: Left Arm)   Pulse 75   Temp 98.3 F (36.8 C) (Oral)   Resp 18   Ht 6\' 1"  (1.854 m)   Wt 96.6 kg   SpO2 100%   BMI 28.10 kg/m  SpO2: SpO2: 100 % O2 Device: O2 Device: Room Air O2 Flow Rate: O2 Flow Rate (L/min): 2 L/min  Intake/output summary:   Intake/Output Summary (Last 24 hours) at 01/07/2021 1537 Last data filed at 01/07/2021 03/07/2021 Gross per 24 hour  Intake --  Output 515 ml  Net -515 ml   LBM: Last BM Date: 01/07/21 Baseline Weight: Weight: 91.9 kg Most recent weight: Weight: 96.6 kg       Palliative Assessment/Data: PPS 40%     Palliative Care Assessment & Plan   Patient Profile: 65 y.o.malewith past medical history of seizures and  ETOH abuse presented to the ED from home after an unwitnessed fall and unknown downtime. Patient wasadmitted on 2/6/2022with AMS, sepsis of unknown source likely CAP, rhabdomyolysis, weakness/fall, acute on chronic anemia, and protein calorie malnutrition (albumin noted at 1.8 on 01/03/21).  Of note, patient does have social concerns of poor living conditions, which include roaches and bedbug infestation.  Assessment: Hypovolemia Hypoglycemia Shock Encephalopathy Anemia History of seizures ETOH abuse Weakness/fall Protein calorie malnutrition   Recommendations/Plan:  Full code full scope treatment  Continue current medical care  MOST form completed and will be scanned into EMR with original placed on shadow chart  Patient stating he wants his wife to be his HCPOA, but she is unable to fulfill this role  Plan to meet with son 03/03/21 tomorrow at 11:00  Goals of Care and Additional Recommendations:  Limitations on Scope of Treatment: Full Scope Treatment  Code Status:  Full  Prognosis:   Unable to determine  Discharge Planning:  SNF for rehab  Care plan was discussed with nursing  Thank you for allowing the Palliative Medicine Team to assist in the care of this patient.   Total Time 35 minutes Prolonged Time Billed  no       Greater than 50%  of this time was spent counseling and coordinating care related to the above assessment and plan.  Mason Loft, NP  Please contact Palliative Medicine Team phone at [phone] for questions and concerns.

## 2021-01-07 NOTE — Progress Notes (Signed)
eLink Physician-Brief Progress Note Patient Name: Mason Hart DOB: 05-12-1956 MRN: [id number]   Date of Service  01/07/2021  HPI/Events of Note  Called for POC glucose of 66 for which RN gave 1/2 amp of D50.  Fingersticks are being checked Q4H in this non-diabetic patient who is not on insulin. Lowest POC glucose check I see was 63. He has never been symptomatic of hypoglycemia that I can tell.  The only question is whether he has adequate nutritional status to maintain euglycemia via gluconeogenesis. Difficult for me to ascertain this from afar.   eICU Interventions  Suspect we may be overtreating these mildly low values in a non-diabetic patient who appears to be taking adequate PO intake during the daytime.   Nevertheless, out of an abundance of caution I will continue the Q4H POC glucose checks at this time. Primary ICU team may wish to discontinue these checks provided his PO intake is adequate.     Intervention Category Minor Interventions: Other:  Janae Bridgeman 01/07/2021, 5:31 AM

## 2021-01-07 NOTE — Progress Notes (Signed)
Hypoglycemic Event  CBG: 66  Treatment: D50 25 mL (12.5 gm)  Symptoms: None  Follow-up CBG: Time:0354 CBG Result:87  Possible Reasons for Event: Inadequate meal intake  Comments/MD notified:will continue to monitor     Sidonie Dickens

## 2021-01-07 NOTE — Progress Notes (Signed)
SLP Cancellation Note  Patient Details Name: Mason Hart MRN: [id number] DOB: November 23, 1956   Cancelled treatment:       Reason Eval/Treat Not Completed: Patient at procedure or test/unavailable. Discussed precautions with RN, posted sign   Thena Devora, Riley Nearing 01/07/2021, 1:44 PM

## 2021-01-08 DIAGNOSIS — R9389 Abnormal findings on diagnostic imaging of other specified body structures: Secondary | ICD-10-CM | POA: Diagnosis not present

## 2021-01-08 DIAGNOSIS — J189 Pneumonia, unspecified organism: Secondary | ICD-10-CM | POA: Diagnosis not present

## 2021-01-08 DIAGNOSIS — E43 Unspecified severe protein-calorie malnutrition: Secondary | ICD-10-CM | POA: Diagnosis not present

## 2021-01-08 DIAGNOSIS — R531 Weakness: Secondary | ICD-10-CM | POA: Diagnosis not present

## 2021-01-08 DIAGNOSIS — J9601 Acute respiratory failure with hypoxia: Secondary | ICD-10-CM | POA: Diagnosis not present

## 2021-01-08 LAB — CBC
HCT: 27.7 % — ABNORMAL LOW (ref 39.0–52.0)
Hemoglobin: 8.2 g/dL — ABNORMAL LOW (ref 13.0–17.0)
MCH: 25.7 pg — ABNORMAL LOW (ref 26.0–34.0)
MCHC: 29.6 g/dL — ABNORMAL LOW (ref 30.0–36.0)
MCV: 86.8 fL (ref 80.0–100.0)
Platelets: 82 10*3/uL — ABNORMAL LOW (ref 150–400)
RBC: 3.19 MIL/uL — ABNORMAL LOW (ref 4.22–5.81)
RDW: 22.3 % — ABNORMAL HIGH (ref 11.5–15.5)
WBC: 8.6 10*3/uL (ref 4.0–10.5)
nRBC: 0.5 % — ABNORMAL HIGH (ref 0.0–0.2)

## 2021-01-08 LAB — PHOSPHORUS: Phosphorus: 2.3 mg/dL — ABNORMAL LOW (ref 2.5–4.6)

## 2021-01-08 LAB — GLUCOSE, CAPILLARY
Glucose-Capillary: 50 mg/dL — ABNORMAL LOW (ref 70–99)
Glucose-Capillary: 52 mg/dL — ABNORMAL LOW (ref 70–99)
Glucose-Capillary: 68 mg/dL — ABNORMAL LOW (ref 70–99)
Glucose-Capillary: 106 mg/dL — ABNORMAL HIGH (ref 70–99)
Glucose-Capillary: 68 mg/dL — ABNORMAL LOW (ref 70–99)
Glucose-Capillary: 84 mg/dL (ref 70–99)
Glucose-Capillary: 105 mg/dL — ABNORMAL HIGH (ref 70–99)
Glucose-Capillary: 98 mg/dL (ref 70–99)
Glucose-Capillary: 72 mg/dL (ref 70–99)

## 2021-01-08 LAB — BASIC METABOLIC PANEL
Anion gap: 6 (ref 5–15)
BUN: 15 mg/dL (ref 8–23)
CO2: 22 mmol/L (ref 22–32)
Calcium: 8 mg/dL — ABNORMAL LOW (ref 8.9–10.3)
Chloride: 115 mmol/L — ABNORMAL HIGH (ref 98–111)
Creatinine, Ser: 0.93 mg/dL (ref 0.61–1.24)
GFR, Estimated: >60 mL/min (ref >60)
Glucose, Bld: 78 mg/dL (ref 70–99)
Potassium: 4 mmol/L (ref 3.5–5.1)
Sodium: 143 mmol/L (ref 135–145)

## 2021-01-08 LAB — CULTURE, BLOOD (ROUTINE X 2): Culture: NO GROWTH

## 2021-01-08 LAB — HOMOCYSTEINE: Homocysteine: 8 umol/L (ref 0.0–17.2)

## 2021-01-08 LAB — HAPTOGLOBIN: Haptoglobin: 104 mg/dL (ref 32–363)

## 2021-01-08 NOTE — Progress Notes (Addendum)
Hypoglycemic Event  CBG: 50  Treatment: 8 oz juice/soda  Symptoms: None  Follow-up CBG: Time:0452 CBG Result:68  Possible Reasons for Event: Inadequate meal intake  Comments/MD notified:Elink notified, no new orders at this time    Sidonie Dickens

## 2021-01-08 NOTE — Progress Notes (Addendum)
Daily Progress Note   Patient Name: Mason Hart       Date: 01/08/2021 DOB: 1956/09/03  Age: 65 y.o. MRN#: [id number] Attending Physician: Lenoria Chime, MD Primary Care Physician: Gildardo Pounds, NP Admit Date: 01/02/2021  Reason for Follow-up: continued GOC discussion  Subjective: I met with son [name] at bedside. We reviewed the MOST form that patient had completed and signed yesterday. I provided [name] with a copy of this form.   Discussed again the issue of HCPOA. [name] explains to patient that he is already [facility] for his mother (pateint's wife Vaughan Basta). [name] also explains that Vaughan Basta has her own medical issues to deal with and is not really able to fulfill the role of HCPOA. Patient indicates that he understands, and would designate [name] to make his health care decisions if needed.   Reviewed that patient had opted for full code, full scope interventions and what that meant. Discussed that patient had fortunately been able to be extubated fairly quickly this time, but that isn't always the case. Inquired whether patient would want to be prolonged on the ventilator. [name] states he would want to give Mr. Fodor a "fighting chance", but that if he was in a situation with low chance of meaningful recovery, he would not want him to be prolonged by artificial means in that state.  Discussed disposition plan of SNF rehab and where patient would transition next. [name] states patient will not be able to return to his previous living situation. He is currently exploring options for assisted living facilities.   Length of Stay: 6  Current Medications: Scheduled Meds:  . chlorhexidine  15 mL Mouth Rinse BID  . Chlorhexidine Gluconate Cloth  6 each Topical Daily  . enoxaparin (LOVENOX) injection   40 mg Subcutaneous Q24H  . feeding supplement  237 mL Oral TID BM  . folic acid  1 mg Oral Daily  . levETIRAcetam  1,000 mg Oral BID  . mouth rinse  15 mL Mouth Rinse q12n4p  . multivitamin with minerals  1 tablet Oral Daily  . pantoprazole  40 mg Oral Daily  . thiamine  100 mg Oral Daily   Or  . thiamine  100 mg Intravenous Daily    Continuous Infusions: . sodium chloride Stopped (01/05/21 1416)    PRN Meds: acetaminophen **OR**  acetaminophen, diphenhydrAMINE-zinc acetate  Physical Exam Constitutional:      General: He is not in acute distress.    Comments: Chronically ill-appearing  Pulmonary:     Effort: Pulmonary effort is normal.  Skin:    Comments: Dry flaky skin  Neurological:     Mental Status: He is alert and oriented to person, place, and time.             Vital Signs: BP 125/66 (BP Location: Left Arm)   Pulse (!) 52   Temp (!) 97.3 F (36.3 C) (Oral)   Resp 15   Ht '6\' 1"'  (1.854 m)   Wt 96.6 kg   SpO2 100%   BMI 28.10 kg/m  SpO2: SpO2: 100 % O2 Device: O2 Device: Room Air O2 Flow Rate: O2 Flow Rate (L/min): 2 L/min  Intake/output summary:   Intake/Output Summary (Last 24 hours) at 01/08/2021 1146 Last data filed at 01/08/2021 0100 Gross per 24 hour  Intake 720 ml  Output 400 ml  Net 320 ml   LBM: Last BM Date: 01/08/21 Baseline Weight: Weight: 91.9 kg Most recent weight: Weight: 96.6 kg       Palliative Assessment/Data: 40%     Palliative Care Assessment & Plan   Patient Profile: 65 y.o.malewith past medical history of seizures and ETOH abuse presented to the ED from home after an unwitnessed fall and unknown downtime. Patient wasadmitted on 2/6/2022with AMS, sepsis of unknown source likely CAP, rhabdomyolysis, weakness/fall, acute on chronic anemia, and protein calorie malnutrition (albumin noted at 1.8 on 01/03/21).  Of note, patient does have social concerns of poor living conditions, which include roaches and bedbug  infestation.  Assessment: Hypovolemia Hypoglycemia Shock Encephalopathy Anemia History of seizures ETOH abuse Weakness/fall Protein calorie malnutrition   Recommendations/Plan:  Full code full scope treatment  Continue current medical care  MOST form reviewed with [name]  Patient would not want to be prolonged by artificial means/life support in a situation with low chance of meaningful recovery  Patient now stating he is ok with [name] as his HCPOA  Plan to meet with son [name] tomorrow at 11:00 at bedside to complete this document   Goals of Care and Additional Recommendations (per MOST completed 01/07/21):  Cardiopulmonary Resuscitation: Attempt Resuscitation (CPR)  Medical Interventions: Full Scope of Treatment: Use intubation, advanced airway interventions, mechanical ventilation, cardioversion as indicated, medical treatment, IV fluids, etc, also provide comfort measures. Transfer to the hospital if indicated  Antibiotics: Determine use of limitation of antibiotics when infection occurs  IV Fluids: IV fluids for a defined trial period  Feeding Tube: Feeding tube for a defined trial period     Code Status:  Full code  Prognosis:   Unable to determine  Discharge Planning:  SNF for rehab  Care plan was discussed with nursing  Thank you for allowing the Palliative Medicine Team to assist in the care of this patient.   Total Time 35 minutes Prolonged Time Billed  no       Greater than 50%  of this time was spent counseling and coordinating care related to the above assessment and plan.  Lavena Bullion, NP  Please contact Palliative Medicine Team phone at [phone] for questions and concerns.

## 2021-01-08 NOTE — Progress Notes (Signed)
eLink Physician-Brief Progress Note Patient Name: Mason Hart DOB: Aug 20, 1956 MRN: [id number]   Date of Service  01/08/2021  HPI/Events of Note  Blood sugar currently 106 mg / dl.  eICU Interventions  No intervention indicated, will defer any changes to daytime attending physician.        Thomasene Lot [name] 01/08/2021, 5:46 AM

## 2021-01-08 NOTE — Progress Notes (Addendum)
Family Medicine Teaching Service Daily Progress Note Intern Pager: [phone]  Patient name: Mason Hart Medical record number: [id number] Date of birth: 07/18/1956 Age: 65 y.o. Gender: male  Primary Care Provider: [name], NP Consultants: CCM, palliative, hematology/oncology  Code Status: Full  Pt Overview and Major Events to Date:  2/6: admitted 2/8: transferred to ICU  Assessment and Plan: Mason Hart a 65 y.o.malewho presentedwith altered mental statusafter being found down. PMH is significant foralcohol use disorder, hypertension, hypothyroidism and seizure disorder.  Hypoglycemia BG most recently 106 range between 50-106.  Appears asymptomatic otherwise. -Monitor and encourage PO intake -monitor CBGs  Temperature Instability Temp this morning range between 96.1-98.3, most recently being 97.4 MRI unremarkable, no thalamic impairment noted. -Continue to monitor temperature closely  Acute on Chronic Normocytic Anemia  Thrombocytopenia Stable this morning, Hgb 8.2 and platelets 82. Etiology likely multifactorial (alcohol use, acute illness, seizure medication, nutritional deficiency). Anemia panel consistent anemia of chronic disease. No evidence of DIC on DIC panel. No schistocytes seen on repeat peripheral smear (initial schistocytes likely 2/2 transfusion). Transfusion threshold <7.0 -Hematology/oncology consulted, appreciate recommendations -daily CBC -f/u homocysteine/MMA, copper and haptoglobin per heme-onc recs -recommend outpatient GI follow up  Acute Encephalopathy- resolved Appears to be at baseline today. Etiology likely multifactorial 2/2 substance use, seizure disorder and electrolyte abnormalities. -Monitor mental status -PT/OT recommending SNF -SW consulted for SNF placement  Hypotension Patient normotensive, most recently 120/70.  -Continue to monitor BP  Electrolyte abnormalities  Na 143 and K 4 this morning, likely resolved from  yesterday's hypernatremia and hypokalemia.  -Daily BMP -Encourage PO intake  Seizure Disorder Chronic, stable. No seizure activity since admission. -Continue Keppra 1000mg  BID  Alcohol Use Disorder Alcohol level <10 on admission (2/6). No signs of withdrawal at this time. -thiamine -folic acid -multivitamin  Protein Calorie Malnutrition Nutrition following. -Regular diet -Ensure enlive supplementation TID  FEN/GI: regular diet PPx: lovenox    Status is: Inpatient  Remains inpatient appropriate because:Ongoing diagnostic testing needed not appropriate for outpatient work up   Dispo: The patient is from: Home              Anticipated d/c is to: SNF              Anticipated d/c date is: 1 day              Patient currently is not medically stable to d/c.   Difficult to place patient No        Subjective:  No acute overnight events reported. Patient denies any complaints at this time. Nurse stated that she needed to give him orange juice to maintain appropriate blood glucose levels.   Objective: Temp:  [96.1 F (35.6 C)-98.3 F (36.8 C)] 97.4 F (36.3 C) (02/11 2300) Pulse Rate:  [51-76] 57 (02/11 2300) Resp:  [11-22] 15 (02/11 2300) BP: (109-148)/(57-75) 109/57 (02/11 2014) SpO2:  [96 %-100 %] 96 % (02/11 2300) Weight:  [96.6 kg] 96.6 kg (02/11 0100) Physical Exam: General: Patient sitting upright, in no acute distress.  Cardiovascular: RRR, no murmurs or gallops auscultated  Respiratory: faint crackles noted diffusely more prominently along upper lobes bilaterally, breathing comfortably on room air Abdomen: soft, nontender on palpation, presence of active bowel sounds Extremities: presence of edema noted UE bilaterally and left LE, radial and dorsalis pedis pulses strong and equal bilaterally Neuro: AOx3, appropriately conversational Psych: mood appropriate   Laboratory: Recent Labs  Lab 01/05/21 0436 01/06/21 0055 01/06/21 1249 01/07/21 0249  WBC  9.9  8.5  --  9.9  HGB 8.0* 8.7*  --  8.7*  HCT 24.8* 27.0*  --  29.3*  PLT QNS 68* 67* 78*   Recent Labs  Lab 01/02/21 1728 01/03/21 0153 01/03/21 0337 01/03/21 0800 01/05/21 0436 01/06/21 0055 01/07/21 0249  NA 150*   < > 151*   < > 154* 148* 146*  K 3.7   < > 2.9*   < > 3.4* 3.2* 3.4*  CL 118*   < > 118*   < > 124* 119* 116*  CO2 22   < > 22   < > 21* 21* 25  BUN 27*   < > 23   < > 14 15 16   CREATININE 1.16   < > 1.01   < > 1.12 1.07 1.12  CALCIUM 8.4*   < > 8.4*   < > 7.5* 7.7* 8.0*  PROT 6.1*  --  5.7*  --  4.7*  --   --   BILITOT 0.7  --  0.6  --  0.4  --   --   ALKPHOS 59  --  63  --  42  --   --   ALT 28  --  24  --  16  --   --   AST 59*  --  40  --  23  --   --   GLUCOSE 115*   < > 88   < > 94 89 79   < > = values in this interval not displayed.      Imaging/Diagnostic Tests: MR BRAIN WO CONTRAST  Result Date: 01/07/2021 CLINICAL DATA:  Persistent hypothermia and hypoglycemia EXAM: MRI HEAD WITHOUT CONTRAST TECHNIQUE: Multiplanar, multiecho pulse sequences of the brain and surrounding structures were obtained without intravenous contrast. COMPARISON:  03/19/2019 FINDINGS: Motion artifact is present. Brain: There is no acute infarction or intracranial hemorrhage. There is no intracranial mass, mass effect, or edema. There is no hydrocephalus or extra-axial fluid collection. Patchy areas of T2 hyperintensity in the supratentorial white matter are nonspecific but probably reflect stable chronic microvascular ischemic changes. Prominence of the ventricles and sulci reflects generalized parenchymal volume loss similar to the prior study. Vascular: Major vessel flow voids at the skull base are preserved. Skull and upper cervical spine: Heterogeneously decreased T1 marrow signal may reflect hematopoietic marrow given anemia. Sinuses/Orbits: Pansinus mucosal thickening. There is complete opacification of the left sphenoid sinus with evidence of chronic inflammation. Orbits are  unremarkable. Other: Sella is unremarkable.  Mastoid air cells are clear. IMPRESSION: Motion degraded. No evidence of recent infarction, hemorrhage, or mass. Chronic paranasal sinus inflammatory changes. Electronically Signed   By: 03/21/2019 M.D.   On: 01/07/2021 14:53    03/07/2021, DO 01/08/2021, 12:42 AM PGY-1,  Family Medicine FPTS Intern pager: [phone], text pages welcome

## 2021-01-08 NOTE — Progress Notes (Signed)
Hypoglycemic Event  CBG: 68  Treatment: 8 oz juice/soda  Symptoms: None  Follow-up CBG: [id number] CBG Result:84  Possible Reasons for Event: Unknown  Comments/MD notified:    Carleene Mains

## 2021-01-09 DIAGNOSIS — R9389 Abnormal findings on diagnostic imaging of other specified body structures: Secondary | ICD-10-CM | POA: Diagnosis not present

## 2021-01-09 DIAGNOSIS — R531 Weakness: Secondary | ICD-10-CM | POA: Diagnosis not present

## 2021-01-09 DIAGNOSIS — J189 Pneumonia, unspecified organism: Secondary | ICD-10-CM | POA: Diagnosis not present

## 2021-01-09 DIAGNOSIS — J9601 Acute respiratory failure with hypoxia: Secondary | ICD-10-CM | POA: Diagnosis not present

## 2021-01-09 DIAGNOSIS — E43 Unspecified severe protein-calorie malnutrition: Secondary | ICD-10-CM | POA: Diagnosis not present

## 2021-01-09 LAB — CBC WITH DIFFERENTIAL/PLATELET
Abs Immature Granulocytes: 0.1 10*3/uL — ABNORMAL HIGH (ref 0.00–0.07)
Basophils Absolute: 0 10*3/uL (ref 0.0–0.1)
Basophils Relative: 0 %
Eosinophils Absolute: 1.2 10*3/uL — ABNORMAL HIGH (ref 0.0–0.5)
Eosinophils Relative: 15 %
HCT: 28.7 % — ABNORMAL LOW (ref 39.0–52.0)
Hemoglobin: 8.9 g/dL — ABNORMAL LOW (ref 13.0–17.0)
Immature Granulocytes: 1 %
Lymphocytes Relative: 22 %
Lymphs Abs: 1.9 10*3/uL (ref 0.7–4.0)
MCH: 26.4 pg (ref 26.0–34.0)
MCHC: 31 g/dL (ref 30.0–36.0)
MCV: 85.2 fL (ref 80.0–100.0)
Monocytes Absolute: 0.5 10*3/uL (ref 0.1–1.0)
Monocytes Relative: 6 %
Neutro Abs: 4.7 10*3/uL (ref 1.7–7.7)
Neutrophils Relative %: 56 %
Platelets: 127 10*3/uL — ABNORMAL LOW (ref 150–400)
RBC: 3.37 MIL/uL — ABNORMAL LOW (ref 4.22–5.81)
RDW: 22.5 % — ABNORMAL HIGH (ref 11.5–15.5)
WBC: 8.5 10*3/uL (ref 4.0–10.5)
nRBC: 0.4 % — ABNORMAL HIGH (ref 0.0–0.2)

## 2021-01-09 LAB — BASIC METABOLIC PANEL
Anion gap: 5 (ref 5–15)
BUN: 10 mg/dL (ref 8–23)
CO2: 26 mmol/L (ref 22–32)
Calcium: 8.1 mg/dL — ABNORMAL LOW (ref 8.9–10.3)
Chloride: 111 mmol/L (ref 98–111)
Creatinine, Ser: 0.78 mg/dL (ref 0.61–1.24)
GFR, Estimated: >60 mL/min (ref >60)
Glucose, Bld: 56 mg/dL — ABNORMAL LOW (ref 70–99)
Potassium: 3.9 mmol/L (ref 3.5–5.1)
Sodium: 142 mmol/L (ref 135–145)

## 2021-01-09 LAB — GLUCOSE, CAPILLARY
Glucose-Capillary: 109 mg/dL — ABNORMAL HIGH (ref 70–99)
Glucose-Capillary: 109 mg/dL — ABNORMAL HIGH (ref 70–99)
Glucose-Capillary: 117 mg/dL — ABNORMAL HIGH (ref 70–99)
Glucose-Capillary: 34 mg/dL — CL (ref 70–99)
Glucose-Capillary: 56 mg/dL — ABNORMAL LOW (ref 70–99)
Glucose-Capillary: 60 mg/dL — ABNORMAL LOW (ref 70–99)
Glucose-Capillary: 65 mg/dL — ABNORMAL LOW (ref 70–99)
Glucose-Capillary: 67 mg/dL — ABNORMAL LOW (ref 70–99)
Glucose-Capillary: 67 mg/dL — ABNORMAL LOW (ref 70–99)
Glucose-Capillary: 78 mg/dL (ref 70–99)
Glucose-Capillary: 80 mg/dL (ref 70–99)
Glucose-Capillary: 87 mg/dL (ref 70–99)
Glucose-Capillary: 89 mg/dL (ref 70–99)

## 2021-01-09 MED ORDER — DEXTROSE 50 % IV SOLN
25.0000 mL | Freq: Once | INTRAVENOUS | Status: AC
  Administered 2021-01-09: 25 mL via INTRAVENOUS

## 2021-01-09 MED ORDER — DEXTROSE 50 % IV SOLN
INTRAVENOUS | Status: AC
  Filled 2021-01-09: qty 50

## 2021-01-09 MED ORDER — DEXTROSE 50 % IV SOLN
INTRAVENOUS | Status: AC
  Administered 2021-01-09: 50 mL
  Filled 2021-01-09: qty 50

## 2021-01-09 NOTE — Progress Notes (Signed)
Daily Progress Note   Patient Name: Mason Hart       Date: 01/09/2021 DOB: 07/28/1956  Age: 65 y.o. MRN#: [id number] Attending Physician: Billey Co, MD Primary Care Physician: [name], NP Admit Date: 01/02/2021  Reason for Follow-up: GOC, advanced directives  Subjective: HCPOA document was completed at bedside with patient naming step-son [name] as his health care agent. Document was notarized by Joey LCSW. I made a copy of the document to scan into the EMR and gave the original to [place].   Patient is OOB to recliner. Discussed again disposition plan is for SNF rehab for 30 days or so. [name] expresses doubt that patient will be strong enough to go to assisted living after that. He shares that he is working on obtaining Medicaid for patient, which will give patient options for long-term care.    Length of Stay: 7  Current Medications: Scheduled Meds:  . chlorhexidine  15 mL Mouth Rinse BID  . Chlorhexidine Gluconate Cloth  6 each Topical Daily  . enoxaparin (LOVENOX) injection  40 mg Subcutaneous Q24H  . feeding supplement  237 mL Oral TID BM  . folic acid  1 mg Oral Daily  . levETIRAcetam  1,000 mg Oral BID  . mouth rinse  15 mL Mouth Rinse q12n4p  . multivitamin with minerals  1 tablet Oral Daily  . pantoprazole  40 mg Oral Daily  . thiamine  100 mg Oral Daily   Or  . thiamine  100 mg Intravenous Daily    Continuous Infusions: . sodium chloride Stopped (01/05/21 1416)    PRN Meds: acetaminophen **OR** acetaminophen, diphenhydrAMINE-zinc acetate  Physical Exam Vitals reviewed.  Constitutional:      General: He is not in acute distress.    Comments: Chronically ill-appearing  Pulmonary:     Effort: Pulmonary effort is normal.  Neurological:     Mental  Status: He is alert and oriented to person, place, and time.     Motor: Weakness present.             Vital Signs: BP 132/65 (BP Location: Left Arm)   Pulse 60   Temp (!) 97.4 F (36.3 C) (Oral)   Resp (!) 21   Ht 6\' 1"  (1.854 m)   Wt 96.6 kg   SpO2 100%  BMI 28.10 kg/m  SpO2: SpO2: 100 % O2 Device: O2 Device: Room Air O2 Flow Rate: O2 Flow Rate (L/min): 2 L/min  Intake/output summary:   Intake/Output Summary (Last 24 hours) at 01/09/2021 1214 Last data filed at 01/09/2021 0600 Gross per 24 hour  Intake --  Output 950 ml  Net -950 ml   LBM: Last BM Date: 01/09/21 Baseline Weight: Weight: 91.9 kg Most recent weight: Weight: 96.6 kg       Palliative Assessment/Data: 40%      Palliative Care Assessment & Plan   Patient Profile: 65 y.o. male  with past medical history of seizures and ETOH abuse presented to the ED from home after an unwitnessed fall and unknown downtime. Patient was admitted on 01/02/2021 with AMS, sepsis of unknown source likely CAP, rhabdomyolysis, weakness/fall, acute on chronic anemia, and protein calorie malnutrition (albumin noted at 1.8 on 01/03/21).    Of note, patient does have social concerns of poor living conditions, which include roaches and bedbug infestation.    Assessment: Hypovolemia Hypoglycemia Shock Encephalopathy Anemia History of seizures ETOH abuse Weakness/fall Protein calorie malnutrition     Recommendations/Plan:  Full code full scope treatment  Continue current medical care  Patient would not want to be prolonged by artificial means/life support in a situation with low chance of meaningful recovery  HCPOA document completed with step-son [employer] as health care agent. Original given to [name] and copy scanned into the EMR  No additional PMT needs at this time, please call [phone] if we can be of additional assistance or need to actively re-engage with this patient and family      Goals of Care and  Additional Recommendations (per MOST completed 01/07/21):   Cardiopulmonary Resuscitation: Attempt Resuscitation (CPR)  Medical Interventions: Full Scope of Treatment: Use intubation, advanced airway interventions, mechanical ventilation, cardioversion as indicated, medical treatment, IV fluids, etc, also provide comfort measures. Transfer to the hospital if indicated  Antibiotics: Determine use of limitation of antibiotics when infection occurs  IV Fluids: IV fluids for a defined trial period  Feeding Tube: Feeding tube for a defined trial period      Code Status:  Full  Prognosis:   Unable to determine  Discharge Planning:  SNF for rehab  Care plan was discussed with nursing  Thank you for allowing the Palliative Medicine Team to assist in the care of this patient.   Total Time 25 minutes Prolonged Time Billed  no       Greater than 50%  of this time was spent counseling and coordinating care related to the above assessment and plan.  Merry Proud, NP  Please contact Palliative Medicine Team phone at [phone] for questions and concerns.

## 2021-01-09 NOTE — Progress Notes (Signed)
Ambulated patient on the unit 182 feet.  Patient tolerated well, used front wheel walker for assistance.

## 2021-01-09 NOTE — Progress Notes (Signed)
Family Medicine Teaching Service Daily Progress Note Intern Pager: [phone]  Patient name: Mason Hart Medical record number: [id number] Date of birth: 1956-05-14 Age: 65 y.o. Gender: male  Primary Care Provider: [name], NP Consultants: CCM, palliative, heme-onc Code Status: Full  Pt Overview and Major Events to Date:  2/6: admitted 2/8: transferred to ICU  Assessment and Plan:  Mason Hart a 65 y.o.malewho presented with altered mental status after being found down. PMH is significant foralcohol use disorder, hypertension, hypothyroidism and seizure disorder.  Persistent Hypoglycemia BG range 56-117 overnight. This morning he had a CBG reading of 43 on R and 34 on L. He was sweaty and temperatures have been slightly low, but patient is otherwise asymptomatic. Has been repeatedly hypoglycemic despite good PO intake. -Will consult endocrine for additional recommendations -Continue CBG monitoring q4h  Hypothermia Stable, improved. Temp has remained between 97.3-98 over past 24h. Unclear etiology. -Endocrine consult as above -Continue to monitor temperature closely  Acute on Chronic Normocytic Anemia  Thrombocytopenia Stable. Hgb 8.9, Plt 127. Etiology likely multifactorial (alcohol use, acute illness, seizure medication, nutritional deficiency). -f/u MMA and copper (odered per heme-onc recs). Haptoglobin and homocysteine wnl. -Outpatient GI f/u (will need colonoscopy) -Daily CBC -Transfusion threshold <7.0  Acute Encephalopathy- resolved Patient remains at his baseline mental status, A&O x3. Etiology of initial encephalopathy likely multifactorial 2/2 substance use, ?seizure, hypothermia. -PT/OT recommending SNF -SW consulted for SNF placement  Hypotension- improved BP range 95-126 systolic over past 24h. -Continue to monitor BP  Seizure Disorder Chronic, stable. No seizure activity since admission. -Continue Keppra 1000mg  BID  Alcohol Use  Disorder Alcohol level <10 on admission (2/6). -Will d/c CIWA monitoring -Daily thiamine, folic acid, multivitamin  Protein Calorie Malnutrition Nutrition following. -Regular diet -Ensure enlive supplementation TID  FEN/GI: Regular diet PPx: Lovenox   Status is: Inpatient Remains inpatient appropriate because:Ongoing diagnostic testing needed not appropriate for outpatient work up   Dispo: The patient is from: Home              Anticipated d/c is to: SNF              Anticipated d/c date is: 2 days              Patient currently is not medically stable to d/c.   Difficult to place patient No    Subjective:  Patient with hypoglycemic episodes overnight (57) and this morning (34/43). He denies any symptoms related to his hypoglycemia. Per nurse, patient did very well ambulating in the hallway with a walker this morning. When he returned to the room, that's when his sugar was 34/43. He was slightly sweaty but otherwise asymptomatic.  Objective: Temp:  [97.4 F (36.3 C)-98 F (36.7 C)] 97.4 F (36.3 C) (02/13 0400) Pulse Rate:  [52-68] 65 (02/13 0400) Resp:  [11-21] 13 (02/13 0400) BP: (95-126)/(46-78) 126/78 (02/13 0400) SpO2:  [90 %-100 %] 98 % (02/13 0400) Physical Exam: General: alert, sitting in chair Cardiovascular: RRR, normal S1/S2 without m/r/g Respiratory: normal WOB, lungs CTAB Extremities: 2+ pitting edema of left lower extremity, edema of bilateral hands improved from prior Neuro: alert, oriented x3, grossly intact  Laboratory: Recent Labs  Lab 01/06/21 0055 01/06/21 1249 01/07/21 0249 01/08/21 0128  WBC 8.5  --  9.9 8.6  HGB 8.7*  --  8.7* 8.2*  HCT 27.0*  --  29.3* 27.7*  PLT 68* 67* 78* 82*   Recent Labs  Lab 01/02/21 1728 01/03/21 0153 01/03/21 0337 01/03/21 0800 01/05/21  3710 01/06/21 0055 01/07/21 0249 01/08/21 0128  NA 150*   < > 151*   < > 154* 148* 146* 143  K 3.7   < > 2.9*   < > 3.4* 3.2* 3.4* 4.0  CL 118*   < > 118*   < > 124*  119* 116* 115*  CO2 22   < > 22   < > 21* 21* 25 22  BUN 27*   < > 23   < > 14 15 16 15   CREATININE 1.16   < > 1.01   < > 1.12 1.07 1.12 0.93  CALCIUM 8.4*   < > 8.4*   < > 7.5* 7.7* 8.0* 8.0*  PROT 6.1*  --  5.7*  --  4.7*  --   --   --   BILITOT 0.7  --  0.6  --  0.4  --   --   --   ALKPHOS 59  --  63  --  42  --   --   --   ALT 28  --  24  --  16  --   --   --   AST 59*  --  40  --  23  --   --   --   GLUCOSE 115*   < > 88   < > 94 89 79 78   < > = values in this interval not displayed.    Imaging/Diagnostic Tests: No new results in past 24h.   , MD 01/09/2021, 6:46 AM PGY-1, [facility] Family Medicine FPTS Intern pager: [phone], text pages welcome

## 2021-01-09 NOTE — Progress Notes (Signed)
CBG is 60 at this time. Pt is wanting to drink juice and eat on his tray. RN will recheck once pt finishes.   Larey Days, RN

## 2021-01-09 NOTE — Progress Notes (Signed)
Hypoglycemic Event  CBG: 67  Treatment: D50 50 mL (25 gm)  Symptoms: None  Follow-up CBG: Time:1218 CBG Result:109  Possible Reasons for Event: Unknown     Carleene Mains

## 2021-01-09 NOTE — Progress Notes (Signed)
Hypoglycemic Event  CBG: 65  Treatment: D50 50 mL (25 gm)  Symptoms: None  Follow-up CBG: Time:0850 CBG Result:89  Possible Reasons for Event: Unknown  Carleene Mains

## 2021-01-09 NOTE — Progress Notes (Signed)
Patient ambulated in the hall 182 feet. Patient tolerated well, used front wheel walker for assistance.

## 2021-01-09 NOTE — Progress Notes (Signed)
Hypoglycemic Event  CBG: 43 on right hand and 34 left hand  Treatment: D50 50 mL (25 gm)  Symptoms: Sweaty  Follow-up CBG: [id number]:[id number] CBG Result:65  Possible Reasons for Event: Unknown  Comments/MD notified:MD aware and will consult endocronologist    Carleene Mains

## 2021-01-09 NOTE — Progress Notes (Signed)
Hypoglycemic Event  CBG: 67  Treatment: Apple juice and Graham crackers  Symptoms: None  Follow-up CBG: Time: 0444 CBG Result: 56  Treatment: 1/2 amp D50  Follow-up CBG: Time: 0509   CBG Result: 89  Possible Reasons for Event: Unknown   Comments/MD notified:    Mason Hart

## 2021-01-10 ENCOUNTER — Encounter (HOSPITAL_COMMUNITY): Payer: Self-pay | Admitting: Pulmonary Disease

## 2021-01-10 ENCOUNTER — Other Ambulatory Visit: Payer: Self-pay

## 2021-01-10 DIAGNOSIS — R9389 Abnormal findings on diagnostic imaging of other specified body structures: Secondary | ICD-10-CM | POA: Diagnosis not present

## 2021-01-10 LAB — CBC
HCT: 28.2 % — ABNORMAL LOW (ref 39.0–52.0)
Hemoglobin: 8.5 g/dL — ABNORMAL LOW (ref 13.0–17.0)
MCH: 26 pg (ref 26.0–34.0)
MCHC: 30.1 g/dL (ref 30.0–36.0)
MCV: 86.2 fL (ref 80.0–100.0)
Platelets: 150 10*3/uL (ref 150–400)
RBC: 3.27 MIL/uL — ABNORMAL LOW (ref 4.22–5.81)
RDW: 22.7 % — ABNORMAL HIGH (ref 11.5–15.5)
WBC: 9.2 10*3/uL (ref 4.0–10.5)
nRBC: 0.2 % (ref 0.0–0.2)

## 2021-01-10 LAB — BASIC METABOLIC PANEL
Anion gap: 7 (ref 5–15)
BUN: 15 mg/dL (ref 8–23)
CO2: 26 mmol/L (ref 22–32)
Calcium: 8.3 mg/dL — ABNORMAL LOW (ref 8.9–10.3)
Chloride: 108 mmol/L (ref 98–111)
Creatinine, Ser: 1.01 mg/dL (ref 0.61–1.24)
GFR, Estimated: >60 mL/min (ref >60)
Glucose, Bld: 87 mg/dL (ref 70–99)
Potassium: 3.9 mmol/L (ref 3.5–5.1)
Sodium: 141 mmol/L (ref 135–145)

## 2021-01-10 LAB — T4, FREE: Free T4: 0.89 ng/dL (ref 0.61–1.12)

## 2021-01-10 LAB — GLUCOSE, CAPILLARY
Glucose-Capillary: 43 mg/dL — CL (ref 70–99)
Glucose-Capillary: 116 mg/dL — ABNORMAL HIGH (ref 70–99)
Glucose-Capillary: 71 mg/dL (ref 70–99)
Glucose-Capillary: 62 mg/dL — ABNORMAL LOW (ref 70–99)
Glucose-Capillary: 95 mg/dL (ref 70–99)
Glucose-Capillary: 78 mg/dL (ref 70–99)
Glucose-Capillary: 74 mg/dL (ref 70–99)

## 2021-01-10 LAB — METHYLMALONIC ACID, SERUM: Methylmalonic Acid, Quantitative: 141 nmol/L (ref 0–378)

## 2021-01-10 LAB — TSH: TSH: 10.78 u[IU]/mL — ABNORMAL HIGH (ref 0.350–4.500)

## 2021-01-10 MED ORDER — DEXTROSE 50 % IV SOLN
INTRAVENOUS | Status: AC
  Administered 2021-01-10: 50 mL
  Filled 2021-01-10: qty 50

## 2021-01-10 NOTE — Progress Notes (Signed)
Hypoglycemic Event  CBG: 62  Treatment: D50 50 mL (25 gm)  Symptoms: Sweaty  Follow-up CBG: Time:930 CBG Result:95 Possible Reasons for Event: Unknown  Comments/MD notified:MD notify     Kharlie Bring K Tala Eber

## 2021-01-10 NOTE — Progress Notes (Signed)
  Speech Language Pathology Treatment: Dysphagia  Patient Details Name: Mason Hart MRN: [id number] DOB: 29-Dec-1955 Today's Date: 01/10/2021 Time: 1120-     Assessment / Plan / Recommendation Clinical Impression  Reviewed precautions from last week's MBS. Pt self-fed thin liquid, multiple/successive sips leading to coughing. Reviewed precautions again: one sip at at time, clear throat, re-swallow.  Pt able to verbalize but with difficulty carrying out tasks, requiring moderate cues.  Upon ending session, able to verbalize strategies independently and give rationale.  Lungs with rhonchi, consistent across admission.  He should continue to follow precautions, when able, to minimize risk of aspiration.  SLP will follow.  HPI HPI: Pt is a 65 y.o. male with PMH is significant for alcohol use disorder, hypertension, hypothyroidism and seizure disorder. He presented with extremity weakness and altered mental status; admitted with hypotension, rhabdomyolysis, acute on chronic anemia, and sepsis suspicious for pneumonia treating with CAP coverage. Pt loaded with Keppra in ED.  CXR 2/9: No substantial change in the right greater than left basilar airspace disease with small left pleural effusion. Cortrak scheduled to be placed 2/9. MBS 03/20/19: moderate oropharyngeal dysphagia; Oral phase with decreased oral control due to weakess with premature spillage of boluses into pharynx/larynx.  Also laryngeal penetration of thin and nectar apparent due to decreased epiglottic deflection and timing of laryngeal closure.  Mild vallecular residuals present without pt awareness.  A dysphagia 1 diet with thin liquids was recommended at that time with subsequent advancement to regular thin by 03/24/19.      SLP Plan  Continue with current plan of care       Recommendations  Diet recommendations: Regular;Thin liquid Liquids provided via: Cup;Straw Medication Administration: Whole meds with puree Supervision: Full  supervision/cueing for compensatory strategies Postural Changes and/or Swallow Maneuvers: Seated upright 90 degrees                Oral Care Recommendations: Oral care BID Follow up Recommendations: Skilled Nursing facility SLP Visit Diagnosis: Dysphagia, oropharyngeal phase (R13.12) Plan: Continue with current plan of care       GO                Blenda Mounts Laurice 01/10/2021, 11:29 AM  Marchelle Folks L. Samson Frederic, MA CCC/SLP Acute Rehabilitation Services Office number [phone] Pager [phone]

## 2021-01-10 NOTE — TOC Progression Note (Signed)
Transition of Care [facility]) - Progression Note    Patient Details  Name: Mason Hart MRN: [id number] Date of Birth: 02/16/1956  [facility]) CM/SW Contact  Okey Dupre Lazaro Arms, LCSW Phone Number: 01/10/2021, 2:31 PM  Clinical Narrative:  Call made to patient's son [name] [phone]) and skilled nursing facility bed offers provided. The facilities are: [facility], [facility] and [facility]. Mr. Claudette Laws informed CSW that he will do some checking and get back with CSW.     Expected Discharge Plan: Skilled Nursing Facility Barriers to Discharge: Continued Medical Work up  Expected Discharge Plan and Services Expected Discharge Plan: Skilled Nursing Facility                                             Social Determinants of Health (SDOH) Interventions  No SDOH interventions requested or needed at this time.  Readmission Risk Interventions Readmission Risk Prevention Plan 03/23/2019  Transportation Screening Complete  PCP or Specialist Appt within 5-7 Days Not Complete  Home Care Screening Complete  Medication Review (RN CM) Not Complete  Some recent data might be hidden

## 2021-01-10 NOTE — Progress Notes (Signed)
Family Medicine Teaching Service Daily Progress Note Intern Pager: [phone]  Patient name: Mason Hart Medical record number: [id number] Date of birth: 09-Jan-1956 Age: 65 y.o. Gender: male  Primary Care Provider: [name], NP Consultants: CCM, palliative, heme-onc Code Status: Full  Pt Overview and Major Events to Date:  2/6: admitted 2/8: transferred to ICU 2/10: returned to FPTS  Assessment and Plan:  Mason Hart a 65 y.o.malewho presented with altered mental status after being found down. PMH is significant foralcohol use disorder, hypertension, hypothyroidism and seizure disorder.  Persistent Hypoglycemia  Hypothermia BG range 60-116 overnight. Has been repeatedly hypoglycemic (down to 34 on POC glucose) despite good PO intake. Not on any medications to cause this. Has associated hypothermia ~97.4 (as low as 87.7 on initial presentation) and occasional diaphoresis. Am cortisol, TSH, MRI brain were all normal. -Will consult endocrine for additional recommendations -Will recheck TSH, Free T3 and T4 -Consider ACTH stimulation testing -Continue CBG monitoring q4h  Acute on Chronic Normocytic Anemia  Thrombocytopenia Stable. Hgb 8.5, Plt 150 this morning. Etiology likely multifactorial (alcohol use, acute illness, seizure medication, nutritional deficiency). -f/u MMA and copper (ordered per heme-onc recs). Haptoglobin and homocysteine wnl. -Outpatient GI f/u (will need colonoscopy) -Daily CBC -Transfusion threshold <7.0  Acute Encephalopathy- resolved Patient remains at his baseline mental status, A&O x3 (although says the president is Reagan). Etiology of initial encephalopathy likely multifactorial 2/2 substance use, ?seizure, hypothermia. -PT/OT recommending SNF -SW consulted for SNF placement  Hypotension- improved BP range 96-117 systolic over past 24h. Is ambulating well without lightheadedness, etc. -Continue to monitor BP  Seizure Disorder Chronic,  stable. No seizure activity since admission. -Continue Keppra 1000mg  BID  Alcohol Use Disorder Alcohol level <10 on admission (2/6). -Daily thiamine, folic acid, multivitamin  Protein Calorie Malnutrition Nutrition following. -Regular diet -Ensure enlive supplementation TID  FEN/GI: Regular diet PPx: Lovenox   Status is: Inpatient Remains inpatient appropriate because:Ongoing diagnostic testing needed not appropriate for outpatient work up  Dispo: The patient is from: Home              Anticipated d/c is to: SNF              Anticipated d/c date is: 2 days              Patient currently is not medically stable to d/c.   Difficult to place patient No    Subjective:  Patient denies complaints this morning. Says he feels well.  Objective: Temp:  [97.4 F (36.3 C)-98.2 F (36.8 C)] 97.6 F (36.4 C) (02/14 0839) Pulse Rate:  [75-91] 77 (02/14 0839) Resp:  [16-24] 16 (02/14 0839) BP: (96-118)/(51-94) 118/65 (02/14 0839) SpO2:  [91 %-100 %] 100 % (02/14 0839) Weight:  [104.4 kg] 104.4 kg (02/13 2105) Physical Exam: General: alert, sitting comfortably in bed, mildly diaphoretic on forehead Cardiovascular: RRR, normal S1/S2 without m/r/g Respiratory: normal WOB, lungs CTAB Extremities: 2+ pitting edema of left lower extremity, 1+ dependent edema in bilateral upper extremities Neuro: alert, oriented to place and time, grossly intact  Laboratory: Recent Labs  Lab 01/08/21 0128 01/09/21 0750 01/10/21 0813  WBC 8.6 8.5 9.2  HGB 8.2* 8.9* 8.5*  HCT 27.7* 28.7* 28.2*  PLT 82* 127* 150   Recent Labs  Lab 01/05/21 0436 01/06/21 0055 01/08/21 0128 01/09/21 0750 01/10/21 0813  NA 154*   < > 143 142 141  K 3.4*   < > 4.0 3.9 3.9  CL 124*   < > 115* 111  108  CO2 21*   < > 22 26 26   BUN 14   < > 15 10 15   CREATININE 1.12   < > 0.93 0.78 1.01  CALCIUM 7.5*   < > 8.0* 8.1* 8.3*  PROT 4.7*  --   --   --   --   BILITOT 0.4  --   --   --   --   ALKPHOS 42  --   --   --    --   ALT 16  --   --   --   --   AST 23  --   --   --   --   GLUCOSE 94   < > 78 56* 87   < > = values in this interval not displayed.    Imaging/Diagnostic Tests: No new results in past 24h.   , MD 01/10/2021, 11:25 AM PGY-1, [facility] Family Medicine FPTS Intern pager: [phone], text pages welcome

## 2021-01-10 NOTE — Plan of Care (Signed)
  Problem: Education: Goal: Knowledge of General Education information will improve Description Including pain rating scale, medication(s)/side effects and non-pharmacologic comfort measures Outcome: Progressing   

## 2021-01-10 NOTE — Progress Notes (Signed)
FPTS Interim Progress Note  Spoke with Dr. Sharl Ma from endocrinology.  He states that in a patient with no prior history of diabetes, there is no reason to be concerned about hypoglycemia unless glucose values are less than ~50.  He also states that point-of-care glucose readings are notoriously inaccurate, particularly in acutely ill patients, patients who have recently been on Levophed or other vaso-active medications, and in patients with peripheral vascular disease. Given that venous glucose samples (BMPs) have been 56 and above, he would favor discontinuing POC glucose monitoring. In the event that a venous glucose is <55, we could obtain pro-insulin, insulin, c-peptide, and sulfonylurea panel before treating the hypoglycemia.  He recommends rechecking TSH in ~4 weeks, as it is common to have mild elevations following an acute illness. No intervention necessary at this time because his free T4 is normal.   Maury Dus, MD 01/10/2021, 3:39 PM PGY-1, [facility] Family Medicine Service pager [phone]

## 2021-01-11 DIAGNOSIS — R9389 Abnormal findings on diagnostic imaging of other specified body structures: Secondary | ICD-10-CM | POA: Diagnosis not present

## 2021-01-11 LAB — COPPER, SERUM: Copper: 80 ug/dL (ref 69–132)

## 2021-01-11 LAB — BASIC METABOLIC PANEL
Anion gap: 5 (ref 5–15)
BUN: 16 mg/dL (ref 8–23)
CO2: 27 mmol/L (ref 22–32)
Calcium: 8.2 mg/dL — ABNORMAL LOW (ref 8.9–10.3)
Chloride: 108 mmol/L (ref 98–111)
Creatinine, Ser: 0.8 mg/dL (ref 0.61–1.24)
GFR, Estimated: >60 mL/min (ref >60)
Glucose, Bld: 79 mg/dL (ref 70–99)
Potassium: 4.2 mmol/L (ref 3.5–5.1)
Sodium: 140 mmol/L (ref 135–145)

## 2021-01-11 LAB — CBC
HCT: 25.9 % — ABNORMAL LOW (ref 39.0–52.0)
Hemoglobin: 7.8 g/dL — ABNORMAL LOW (ref 13.0–17.0)
MCH: 26.1 pg (ref 26.0–34.0)
MCHC: 30.1 g/dL (ref 30.0–36.0)
MCV: 86.6 fL (ref 80.0–100.0)
Platelets: 160 10*3/uL (ref 150–400)
RBC: 2.99 MIL/uL — ABNORMAL LOW (ref 4.22–5.81)
RDW: 23.2 % — ABNORMAL HIGH (ref 11.5–15.5)
WBC: 10.4 10*3/uL (ref 4.0–10.5)
nRBC: 0.2 % (ref 0.0–0.2)

## 2021-01-11 LAB — T3, FREE: T3, Free: 2.3 pg/mL (ref 2.0–4.4)

## 2021-01-11 MED ORDER — HYDROXYZINE HCL 10 MG PO TABS: 10.0000 mg | ORAL_TABLET | Freq: Three times a day (TID) | ORAL | Status: DC | PRN

## 2021-01-11 NOTE — Progress Notes (Signed)
Nutrition Follow-up  DOCUMENTATION CODES:   Severe malnutrition in context of social or environmental circumstances  INTERVENTION:   Continue Ensure Enlive po TID, each supplement provides 350 kcal and 20 grams of protein.  Continue MVI with minerals daily.  NUTRITION DIAGNOSIS:   Severe Malnutrition related to social / environmental circumstances as evidenced by mild fat depletion,moderate fat depletion,mild muscle depletion,moderate muscle depletion.  ongoing  GOAL:   Patient will meet greater than or equal to 90% of their needs  progressing  MONITOR:   Diet advancement,PO intake,Labs,Skin  REASON FOR ASSESSMENT:   Consult Assessment of nutrition requirement/status  ASSESSMENT:   65 yo male admitted after being found down by family for an unknown duration of time. PMH includes seizures, alcohol abuse, HTN, hypothyroidism, RBBB.  2/6 admit 2/8 tx to ICU 2/10 returned to FPTS  Pt has been repeatedly hypoglycemic despite good PO intake and not being on medications to cause this -- per Endocrinology, there is no reason to be concerned given pt without history of DM.  Pt reports feeling well. Pt with great PO intake, 50-100% intake x last 8 recorded meals (94% average meal intake). Pt receiving Ensure TID and is consuming well per RN. Will continue with current nutrition plan of care.   UOP: 2L x 24 hours  Medications: folvite, mvi with minerals, thiamine Labs reviewed.  Diet Order:   Diet Order            Diet regular Room service appropriate? Yes with Assist; Fluid consistency: Thin  Diet effective now                 EDUCATION NEEDS:   Not appropriate for education at this time  Skin:  Skin Assessment: Reviewed RN Assessment (abrasions to buttocks)  Last BM:  2/14 type 6  Height:   Ht Readings from Last 1 Encounters:  01/02/21 6\' 1"  (1.854 m)    Weight:   Wt Readings from Last 1 Encounters:  01/09/21 104.4 kg    Ideal Body Weight:  83.6  kg  BMI:  Body mass index is 30.37 kg/m.  Estimated Nutritional Needs:   Kcal:  2100-2400  Protein:  120-140 gm  Fluid:  >/= 2.1 L    01/11/21, MS, RD, LDN RD pager number and weekend/on-call pager number located in [place].

## 2021-01-11 NOTE — Progress Notes (Signed)
Physical Therapy Treatment Patient Details Name: Mason Hart MRN: [id number] DOB: 1956-04-21 Today's Date: 01/11/2021    History of Present Illness 65 y.o. man admitted on 2/6/22with AMS, likely from sepsis of unknown source, CXR suspicious for bil opacity, rhabdomyolysis, acute on chronic anemia, seizure disorder with history of non- compliance. Other significant PMH includes RBBB, HTN, ETOH abuse.    PT Comments    Pt received in bed, willing to participate in PT. Pt min Ax2 for sit to stand with RW, needing two attempts to get into upright position and raised height of bed. In sitting, tactile and verbal cueing for pt to flex knee to ensure L foot was fully on ground and stable before standing. Cueing to navigate RW during stand pivot to chair. Reported increased in dizziness and discomfort upon standing. Notable unsteadiness. Gait deferred due to increase in symptoms and fatigue. Pt left in chair with all needs, RN aware of status, call bell within reach, and chair alarm active. Pt left with ice cream with nurse's approval due to history of hypoglycemia.    Follow Up Recommendations  SNF;Supervision for mobility/OOB;Home health PT (if able to achieve ADLs home is possible)     Equipment Recommendations  3in1 (PT)    Recommendations for Other Services       Precautions / Restrictions Precautions Precautions: Fall Restrictions Weight Bearing Restrictions: No    Mobility  Bed Mobility Overal bed mobility: Needs Assistance Bed Mobility: Supine to Sit     Supine to sit: Min guard;HOB elevated          Transfers Overall transfer level: Needs assistance Equipment used: Rolling walker (2 wheeled) Transfers: Sit to/from [employer] Sit to Stand: +2 physical assistance;Min assist         General transfer comment: Cues to push from EOB and to ensure L LE is fully on ground before standing, mod A to power up into standing, cueing to turn RW completely before  sitting down  Ambulation/Gait             General Gait Details: Deferred due to increased unsteadiness and dizziness upon standing   Stairs             Wheelchair Mobility    Modified Rankin (Stroke Patients Only)       Balance Overall balance assessment: Needs assistance Sitting-balance support: No upper extremity supported;Feet supported Sitting balance-Leahy Scale: Good     Standing balance support: Bilateral upper extremity supported Standing balance-Leahy Scale: Poor Standing balance comment: Reliant on B UE support in standing                            Cognition Arousal/Alertness: Awake/alert Behavior During Therapy: Flat affect Overall Cognitive Status: No family/caregiver present to determine baseline cognitive functioning Area of Impairment: Orientation;Memory                     Memory: Decreased short-term memory         General Comments: Oriented to month/year and place. Able to follow simple commands and responds appropriately with increased time      Exercises      General Comments        Pertinent Vitals/Pain      Home Living                      Prior Function  PT Goals (current goals can now be found in the care plan section)      Frequency    Min 3X/week      PT Plan Current plan remains appropriate    Co-evaluation              AM-PAC PT "6 Clicks" Mobility   Outcome Measure  Help needed turning from your back to your side while in a flat bed without using bedrails?: A Little Help needed moving from lying on your back to sitting on the side of a flat bed without using bedrails?: A Little Help needed moving to and from a bed to a chair (including a wheelchair)?: A Little Help needed standing up from a chair using your arms (e.g., wheelchair or bedside chair)?: A Little Help needed to walk in hospital room?: A Little Help needed climbing 3-5 steps with a railing? :  A Lot 6 Click Score: 17    End of Session Equipment Utilized During Treatment: Gait belt Activity Tolerance: Patient limited by fatigue Patient left: in chair;with call bell/phone within reach;with chair alarm set Nurse Communication: Mobility status PT Visit Diagnosis: Muscle weakness (generalized) (M62.81);Difficulty in walking, not elsewhere classified (R26.2)     Time:  -     Charges:                        Conley Rolls, SPT

## 2021-01-11 NOTE — Progress Notes (Addendum)
Family Medicine Teaching Service Daily Progress Note Intern Pager: [phone]  Patient name: Mason Hart Medical record number: [id number] Date of birth: 04-15-1956 Age: 65 y.o. Gender: male  Primary Care Provider: [name], NP Consultants: CCM, palliative, heme-onc Code Status: Full  Pt Overview and Major Events to Date:  2/6: admitted 2/8: transferred to ICU 2/10: returned to FPTS  Assessment and Plan:  Mason Hart a 65 y.o.malewho presented with altered mental status after being found down. PMH is significant foralcohol use disorder, hypertension, hypothyroidism and seizure disorder. Medically stable for discharge to SNF.  Hypoglycemia  Hypothermia Stable, resolved. Spoke with endocrinology yesterday who is not concerned about venous glucose levels >55 in patient without diabetes. Am BMP shows glucose of 79 this morning. Temp stable between 97.4-98.2. -f/u free T3 results -If glucose <55 on BMP, consider pro-insulin, insulin, c-peptide, and sulfonylurea panel  Acute on Chronic Normocytic Anemia Hgb stable at 7.8 this morning. Etiology likely multifactorial (alcohol use, acute illness, seizure medication, nutritional deficiency). -MMA, copper, haptoglobin and homocysteine all wnl. -Outpatient GI f/u (will need colonoscopy) -Daily CBC -Transfusion threshold <7.0  Acute Encephalopathy- resolved Patient remains at his baseline mental status. Etiology of initial encephalopathy likely multifactorial 2/2 etoh use, ?seizure, hypothermia. -SW consulted for SNF placement  Seizure Disorder Chronic, stable. No seizure activity since admission. -Continue Keppra 1000mg  BID  Alcohol Use Disorder Alcohol level <10 on admission (2/6). -Daily thiamine, folic acid, multivitamin  Protein Calorie Malnutrition Nutrition following. -Regular diet -Ensure enlive supplementation TID  Bedbugs, s/p decontamination Patient with diffusely itchy skin despite topical benadryl and  decontamination on admission. -Continue topical Benadryl -Will add Atarax 10mg  TID prn for itching   FEN/GI: Regular diet PPx: Lovenox   Status is: Inpatient Remains inpatient appropriate because:awaiting SNF  Dispo: The patient is from: Home              Anticipated d/c is to: SNF              Anticipated d/c date is: 1 day              Patient currently is medically stable to d/c.   Difficult to place patient No    Subjective:  No acute overnight events. Patient feels well this morning and has no complaints.  Objective: Temp:  [97.4 F (36.3 C)-98.1 F (36.7 C)] 97.4 F (36.3 C) (02/15 0521) Pulse Rate:  [62-85] 62 (02/15 0521) Resp:  [16-18] 18 (02/15 0521) BP: (118-144)/(62-84) 144/84 (02/15 0521) SpO2:  [96 %-100 %] 99 % (02/15 0521) Physical Exam: General: alert, sitting comfortably in bed Cardiovascular: RRR, normal S1/S2 without m/r/g Respiratory: normal WOB, lungs CTAB Extremities: 2+ pitting edema of left lower extremity, 1+ dependent edema in bilateral upper extremities Neuro: alert, oriented to month/year, states president is Reagan  Laboratory: Recent Labs  Lab 01/09/21 0750 01/10/21 0813 01/11/21 0246  WBC 8.5 9.2 10.4  HGB 8.9* 8.5* 7.8*  HCT 28.7* 28.2* 25.9*  PLT 127* 150 160   Recent Labs  Lab 01/05/21 0436 01/06/21 0055 01/09/21 0750 01/10/21 0813 01/11/21 0246  NA 154*   < > 142 141 140  K 3.4*   < > 3.9 3.9 4.2  CL 124*   < > 111 108 108  CO2 21*   < > 26 26 27   BUN 14   < > 10 15 16   CREATININE 1.12   < > 0.78 1.01 0.80  CALCIUM 7.5*   < > 8.1* 8.3* 8.2*  PROT 4.7*  --   --   --   --  BILITOT 0.4  --   --   --   --   ALKPHOS 42  --   --   --   --   ALT 16  --   --   --   --   AST 23  --   --   --   --   GLUCOSE 94   < > 56* 87 79   < > = values in this interval not displayed.    Imaging/Diagnostic Tests: No new results in past 24h.   Maury Dus, MD 01/11/2021, 6:24 AM PGY-1, [facility] Family Medicine FPTS  Intern pager: [phone], text pages welcome

## 2021-01-11 NOTE — TOC Progression Note (Signed)
Transition of Care [facility]) - Progression Note    Patient Details  Name: Mason Hart MRN: [id number] Date of Birth: [birth year]  Transition of Care [facility]) CM/SW Contact  Okey Dupre Lazaro Arms, LCSW Phone Number: 01/11/2021, 4:40 PM  Clinical Narrative:  Mateo Flow provided with bed offers on 2/14. Followed up with [name] on 2/15 [phone]) and he requested [facility]. Contact made with admission director Olegario Messier and they cannot accept patient as their concerns are bed bugs at home and his current drinking  problem.  Son updated regarding [facility] and gave CSW permission to contact [facility] and [place] to determine if either facility can accept patient.   Loie with [facility] contacted [phone]) and message left. Spoke with Randa Lynn, admissions director at [facility] [phone]) and she will review patient's information and get back with CSW.       Expected Discharge Plan: Skilled Nursing Facility Barriers to Discharge: Continued Medical Work up  Expected Discharge Plan and Services Expected Discharge Plan: Skilled Nursing Facility                                             Social Determinants of Health (SDOH) Interventions  No SDOH interventions requested or needed at this time  Readmission Risk Interventions Readmission Risk Prevention Plan 03/23/2019  Transportation Screening Complete  PCP or Specialist Appt within 5-7 Days Not Complete  Home Care Screening Complete  Medication Review (RN CM) Not Complete  Some recent data might be hidden

## 2021-01-11 NOTE — Plan of Care (Signed)
  Problem: Clinical Measurements: Goal: Diagnostic test results will improve Outcome: Completed/Met

## 2021-01-12 DIAGNOSIS — R9389 Abnormal findings on diagnostic imaging of other specified body structures: Secondary | ICD-10-CM | POA: Diagnosis not present

## 2021-01-12 LAB — CBC
HCT: 24.7 % — ABNORMAL LOW (ref 39.0–52.0)
Hemoglobin: 7.7 g/dL — ABNORMAL LOW (ref 13.0–17.0)
MCH: 26.6 pg (ref 26.0–34.0)
MCHC: 31.2 g/dL (ref 30.0–36.0)
MCV: 85.2 fL (ref 80.0–100.0)
Platelets: 207 10*3/uL (ref 150–400)
RBC: 2.9 MIL/uL — ABNORMAL LOW (ref 4.22–5.81)
RDW: 23.3 % — ABNORMAL HIGH (ref 11.5–15.5)
WBC: 6.4 10*3/uL (ref 4.0–10.5)
nRBC: 0 % (ref 0.0–0.2)

## 2021-01-12 LAB — BASIC METABOLIC PANEL
Anion gap: 7 (ref 5–15)
BUN: 12 mg/dL (ref 8–23)
CO2: 28 mmol/L (ref 22–32)
Calcium: 8.1 mg/dL — ABNORMAL LOW (ref 8.9–10.3)
Chloride: 106 mmol/L (ref 98–111)
Creatinine, Ser: 0.76 mg/dL (ref 0.61–1.24)
GFR, Estimated: >60 mL/min (ref >60)
Glucose, Bld: 74 mg/dL (ref 70–99)
Potassium: 3.7 mmol/L (ref 3.5–5.1)
Sodium: 141 mmol/L (ref 135–145)

## 2021-01-12 NOTE — TOC Progression Note (Signed)
Transition of Care [facility]) - Progression Note    Patient Details  Name: Mason Hart MRN: [id number] Date of Birth: 1956/08/11  Transition of Care [facility]) CM/SW Contact  Okey Dupre Lazaro Arms, LCSW Phone Number: 01/12/2021, 11:22 AM  Clinical Narrative:  CSW received a call from [facility], admissions director at [facility] regarding patient indicating that they can accept patient for ST rehab.  Son, [name] contacted [phone]) and informed that [facility] can take his dad, and he expressed agreement. Loie contacted and updated. CSW will continue to follow and provide SW intervention services as needed through discharge.     Expected Discharge Plan: Skilled Nursing Facility Barriers to Discharge: Continued Medical Work up  Expected Discharge Plan and Services Expected Discharge Plan: Skilled Nursing Facility                                              Social Determinants of Health (SDOH) Interventions  No SDOH interventions requested or needed at discharge.  Readmission Risk Interventions Readmission Risk Prevention Plan 03/23/2019  Transportation Screening Complete  PCP or Specialist Appt within 5-7 Days Not Complete  Home Care Screening Complete  Medication Review (RN CM) Not Complete  Some recent data might be hidden

## 2021-01-12 NOTE — Progress Notes (Signed)
   01/12/21 1440  What Happened  Was fall witnessed? No  Was patient injured? No  Patient found on floor  Found by Staff-comment Marcelino Duster, Eugenie Norrie)  Stated prior activity to/from bed, chair, or stretcher  Follow Up  MD notified Franchot Erichsen, DO  Time MD notified [phone]  Family notified Yes - comment [name])  Time family notified 1500  Additional tests No  Progress note created (see row info) Yes  Adult Fall Risk Assessment  Risk Factor Category (scoring not indicated) Fall has occurred during this admission (document High fall risk)  Age 65  Fall History: Fall within 6 months prior to admission 5  Elimination; Bowel and/or Urine Incontinence 2  Elimination; Bowel and/or Urine Urgency/Frequency 0  Medications: includes PCA/Opiates, Anti-convulsants, Anti-hypertensives, Diuretics, Hypnotics, Laxatives, Sedatives, and Psychotropics 0  Patient Care Equipment 1  Mobility-Assistance 2  Mobility-Gait 2  Mobility-Sensory Deficit 0  Altered awareness of immediate physical environment 1  Impulsiveness 2  Lack of understanding of one's physical/cognitive limitations 0  Total Score 16  Patient Fall Risk Level High fall risk  Adult Fall Risk Interventions  Required Bundle Interventions *See Row Information* High fall risk - low, moderate, and high requirements implemented  Additional Interventions PT/OT need assessed if change in mobility from baseline;Room near nurses station;Use of appropriate toileting equipment (bedpan, BSC, etc.)  Screening for Fall Injury Risk (To be completed on HIGH fall risk patients) - Assessing Need for Floor Mats  Risk For Fall Injury- Criteria for Floor Mats Previous fall this admission;Admitted as a result of a fall  Will Implement Floor Mats Yes  Vitals  Temp 98.1 F (36.7 C)  Temp Source Oral  BP (!) 114/57  MAP (mmHg) 72  BP Location Right Arm  BP Method Automatic  Patient Position (if appropriate) Lying  Pulse Rate 85  Pulse Rate Source  Monitor  Resp 18  Oxygen Therapy  SpO2 100 %  O2 Device Room Air  Pain Assessment  Pain Scale 0-10  Pain Score 0  Faces Pain Scale 0  PCA/Epidural/Spinal Assessment  Respiratory Pattern Regular;Unlabored  Neurological  Neuro (WDL) X  Level of Consciousness Alert  Orientation Level Oriented to person;Oriented to place;Oriented to time  Cognition Appropriate at baseline  Speech Clear  Glasgow Coma Scale  Eye Opening 4  Best Verbal Response (NON-intubated) 5  Best Motor Response 6  Glasgow Coma Scale Score 15  Musculoskeletal  Musculoskeletal (WDL) X  Assistive Device Front wheel walker  Generalized Weakness Yes  Weight Bearing Restrictions No  Integumentary  Integumentary (WDL) X  RN Assisting with Skin Assessment on Admission Lawernce Ion  Skin Color Appropriate for ethnicity  Skin Condition Dry;Flaky  Abrasion Location Orientation Right  Abrasion Intervention Barrier cream;Foam  Cracking Location Leg  Cracking Location Orientation Bilateral  Cracking Intervention Foam  Excoriated Location Arm;Back;Chest  Excoriated Location Orientation Circumferential  Excoriated Intervention Barrier cream;Cleansed  Skin Turgor Non-tenting

## 2021-01-12 NOTE — Progress Notes (Signed)
Family Medicine Teaching Service Daily Progress Note Intern Pager: [phone]  Patient name: Mason Hart Medical record number: [id number] Date of birth: Jun 18, 1956 Age: 65 y.o. Gender: male  Primary Care Provider: [name], NP Consultants: CCM, palliative, heme-onc Code Status: Full   Pt Overview and Major Events to Date:  2/6: admitted 2/8: transferred to ICU 2/10: returned to FPTS  Assessment and Plan:  Mason Hart a 65 y.o.malewho presentedwith altered mental statusafter being found down. PMH is significant foralcohol use disorder, hypertension, hypothyroidism and seizure disorder. Medically stable for discharge to SNF.  Hypoglycemia  Hypothermia Stable, resolved. Endocrine was consulted and they were not concerned about venous glucose levels >55 in patient without diabetes. Am BMP shows glucose of 79 yesterday morning. Temp stable between 97.4-98.2. 97.5 this am.  -f/u free T3 results -If glucose <55 on BMP, consider pro-insulin, insulin, c-peptide, and sulfonylurea panel  Acute on Chronic Normocytic Anemia Hgb stable at 7.7 this morning. Etiology likely multifactorial (alcohol use, acute illness, seizure medication, nutritional deficiency). -MMA, copper, haptoglobin and homocysteine all wnl. -Outpatient GI f/u (will need colonoscopy) -Daily CBC -Transfusion threshold <7.0  Acute Encephalopathy- resolved Patient remains at his baseline mental status. Etiology of initial encephalopathy likely multifactorial 2/2 etoh use, ?seizure, hypothermia. -SW consulted for SNF placement  Seizure Disorder Chronic, stable. No seizure activity since admission. -Continue Keppra 1000mg  BID  Alcohol Use Disorder Alcohol level <10 on admission (2/6). -Daily thiamine, folic acid, multivitamin  Protein Calorie Malnutrition Nutrition following. -Regular diet -Ensure enlive supplementation TID  Bedbugs, s/p decontamination Patient with diffusely itchy skin  despite topical benadryl and decontamination on admission. -Continue topical Benadryl -Will add Atarax 10mg  TID prn for itching   FEN/GI: Regular diet PPx: Lovenox  Status is: Inpatient Remains inpatient appropriate because:awaiting SNF  Dispo: The patient is from: Home  Anticipated d/c is to: SNF  Anticipated d/c date is: 1 day  Patient currently is medically stable to d/c.              Difficult to place patient No   Subjective:  No acute events overnight. Patient doing well this morning with out any complaints   Objective: Temp:  [97.5 F (36.4 C)-98.2 F (36.8 C)] 97.5 F (36.4 C) (02/16 0525) Pulse Rate:  [65-81] 65 (02/16 0525) Resp:  [17-20] 20 (02/16 0525) BP: (111-121)/(40-65) 117/65 (02/16 0525) SpO2:  [94 %-99 %] 97 % (02/16 0525) Physical Exam: General: alert, sitting in bed, NAD Cardiovascular: RRR no murmurs Respiratory: CTAB. Normal WOB Abdomen: soft, non distended, non tender to palpation Extremities: 2+ pitting edema LLE, 1+ in bilateral UE  Laboratory: Recent Labs  Lab 01/09/21 0750 01/10/21 0813 01/11/21 0246  WBC 8.5 9.2 10.4  HGB 8.9* 8.5* 7.8*  HCT 28.7* 28.2* 25.9*  PLT 127* 150 160   Recent Labs  Lab 01/09/21 0750 01/10/21 0813 01/11/21 0246  NA 142 141 140  K 3.9 3.9 4.2  CL 111 108 108  CO2 26 26 27   BUN 10 15 16   CREATININE 0.78 1.01 0.80  CALCIUM 8.1* 8.3* 8.2*  GLUCOSE 56* 87 79    Imaging/Diagnostic Tests:  No results found.  01/12/21, DO 01/12/2021, 7:58 AM PGY-1, [place] FPTS Intern pager: [phone], text pages welcome

## 2021-01-12 NOTE — Progress Notes (Signed)
Physical Therapy Treatment Patient Details Name: Mason Hart MRN: [id number] DOB: 1956/09/07 Today's Date: 01/12/2021    History of Present Illness 65 y.o. man admitted on 2/6/22with AMS, likely from sepsis of unknown source, CXR suspicious for bil opacity, rhabdomyolysis, acute on chronic anemia, seizure disorder with history of non- compliance. Other significant PMH includes RBBB, HTN, ETOH abuse.    PT Comments    Patient received in bed, pleasant and cooperative during session. Continues to require +2 assist to boost all the way up to standing, but once up tolerated gait about 177f with RW and min guard today with gait deviations as below. Made sure to time session to after a meal today and he did much better and was able to tolerate more- will continue to monitor as appropriate. Left up in recliner with all needs met, chair alarm active. Continue to recommend SNF.    Follow Up Recommendations  SNF;Supervision/Assistance - 24 hour     Equipment Recommendations  3in1 (PT)    Recommendations for Other Services       Precautions / Restrictions Precautions Precautions: Fall Precaution Comments: flaking skin, bil UE edema and LLE edema Restrictions Weight Bearing Restrictions: No    Mobility  Bed Mobility Overal bed mobility: Needs Assistance Bed Mobility: Supine to Sit     Supine to sit: Min guard;HOB elevated     General bed mobility comments: increased time and effort, cues to scoot to being "square" at EOB    Transfers Overall transfer level: Needs assistance Equipment used: Rolling walker (2 wheeled) Transfers: Sit to/from Stand Sit to Stand: +2 physical assistance;Min assist;From elevated surface         General transfer comment: MinAx2 to boost up to full standing position, no dizziness or "swimmy headedness" today, increased time and effort  Ambulation/Gait Ambulation/Gait assistance: Min guard Gait Distance (Feet): 120 Feet Assistive device: Rolling  walker (2 wheeled) Gait Pattern/deviations: Trunk flexed;Wide base of support;Step-through pattern;Decreased step length - right;Decreased step length - left Gait velocity: decreased   General Gait Details: slow but steady wtih RW, has ongoing flexed trunk and posture however question if this is due to chronic hip flexor tightness   Stairs             Wheelchair Mobility    Modified Rankin (Stroke Patients Only)       Balance Overall balance assessment: Needs assistance Sitting-balance support: No upper extremity supported;Feet supported Sitting balance-Leahy Scale: Good Sitting balance - Comments: edema noted in BUEs, able to stabilize trunk at EOB for static sitting   Standing balance support: Bilateral upper extremity supported Standing balance-Leahy Scale: Poor Standing balance comment: Reliant on B UE support in standing                            Cognition Arousal/Alertness: Awake/alert Behavior During Therapy: Flat affect Overall Cognitive Status: No family/caregiver present to determine baseline cognitive functioning                       Memory: Decreased short-term memory         General Comments: able to follow simple commands and more interactive than yesterday; responds appropriately with increased time, motivated and willing to participate      Exercises      General Comments        Pertinent Vitals/Pain Pain Assessment: No/denies pain    Home Living  Prior Function            PT Goals (current goals can now be found in the care plan section) Acute Rehab PT Goals Patient Stated Goal: to sit up PT Goal Formulation: With patient Time For Goal Achievement: 01/19/21 Potential to Achieve Goals: Good Progress towards PT goals: Progressing toward goals    Frequency    Min 3X/week      PT Plan Current plan remains appropriate    Co-evaluation              AM-PAC PT "6  Clicks" Mobility   Outcome Measure  Help needed turning from your back to your side while in a flat bed without using bedrails?: A Little Help needed moving from lying on your back to sitting on the side of a flat bed without using bedrails?: A Little Help needed moving to and from a bed to a chair (including a wheelchair)?: A Little Help needed standing up from a chair using your arms (e.g., wheelchair or bedside chair)?: A Little Help needed to walk in hospital room?: A Little Help needed climbing 3-5 steps with a railing? : A Lot 6 Click Score: 17    End of Session Equipment Utilized During Treatment: Gait belt Activity Tolerance: Patient tolerated treatment well Patient left: in chair;with call bell/phone within reach;with chair alarm set Nurse Communication: Mobility status PT Visit Diagnosis: Muscle weakness (generalized) (M62.81);Difficulty in walking, not elsewhere classified (R26.2)     Time: 0488-8916 PT Time Calculation (min) (ACUTE ONLY): 20 min  Charges:  $Gait Training: 8-22 mins                     Windell Norfolk, DPT, PN1   Supplemental Physical Therapist [place]    Pager [phone] Acute Rehab Office [phone]

## 2021-01-12 NOTE — Progress Notes (Signed)
Speech Language Pathology Treatment: Dysphagia  Patient Details Name: Mason Hart MRN: [id number] DOB: January 11, 1956 Today's Date: 01/12/2021 Time: 4098-1191 SLP Time Calculation (min) (ACUTE ONLY): 12 min  Assessment / Plan / Recommendation Clinical Impression  Pt with improved recall of precautions to protect airway, and required less frequent cueing today to execute strategies.  Consumed regular solids, thin liquids with only min verbal cues overall.  Breath sounds have been unchanged since on current diet (regular/thins), no recent imaging of lungs has been warranted. Recommend continuing this diet and precautions (clear throat and re-swallow) upon discharge.  SLP will follow for safety/education.  HPI HPI: Pt is a 65 y.o. male with PMH is significant for alcohol use disorder, hypertension, hypothyroidism and seizure disorder. He presented with extremity weakness and altered mental status; admitted with hypotension, rhabdomyolysis, acute on chronic anemia, and sepsis suspicious for pneumonia treating with CAP coverage. Pt loaded with Keppra in ED.  CXR 2/9: No substantial change in the right greater than left basilar airspace disease with small left pleural effusion. Cortrak scheduled to be placed 2/9. MBS 03/20/19: moderate oropharyngeal dysphagia; Oral phase with decreased oral control due to weakess with premature spillage of boluses into pharynx/larynx.  Also laryngeal penetration of thin and nectar apparent due to decreased epiglottic deflection and timing of laryngeal closure.  Mild vallecular residuals present without pt awareness.  A dysphagia 1 diet with thin liquids was recommended at that time with subsequent advancement to regular thin by 03/24/19.      SLP Plan  Continue with current plan of care       Recommendations  Diet recommendations: Regular;Thin liquid Liquids provided via: Cup;Straw Medication Administration: Whole meds with puree Supervision: Full supervision/cueing for  compensatory strategies Compensations: Slow rate;Small sips/bites;Clear throat after each swallow;Multiple dry swallows after each bite/sip Postural Changes and/or Swallow Maneuvers: Seated upright 90 degrees                Oral Care Recommendations: Oral care BID Follow up Recommendations: Skilled Nursing facility SLP Visit Diagnosis: Dysphagia, oropharyngeal phase (R13.12) Plan: Continue with current plan of care       GO              Mason Seibold L. Samson Frederic, MA CCC/SLP Acute Rehabilitation Services Office number [phone] Pager [phone]   Mason Hart 01/12/2021, 10:42 AM

## 2021-01-12 NOTE — Care Management Important Message (Signed)
Important Message  Patient Details  Name: Mason Hart MRN: [id number] Date of Birth: Apr 10, 1956   Medicare Important Message Given:  Yes     Oralia Rud Shardai Star 01/12/2021, 2:57 PM

## 2021-01-13 DIAGNOSIS — I959 Hypotension, unspecified: Secondary | ICD-10-CM

## 2021-01-13 LAB — CBC
HCT: 24 % — ABNORMAL LOW (ref 39.0–52.0)
Hemoglobin: 7.4 g/dL — ABNORMAL LOW (ref 13.0–17.0)
MCH: 26.4 pg (ref 26.0–34.0)
MCHC: 30.8 g/dL (ref 30.0–36.0)
MCV: 85.7 fL (ref 80.0–100.0)
Platelets: 296 10*3/uL (ref 150–400)
RBC: 2.8 MIL/uL — ABNORMAL LOW (ref 4.22–5.81)
RDW: 23.1 % — ABNORMAL HIGH (ref 11.5–15.5)
WBC: 6.4 10*3/uL (ref 4.0–10.5)
nRBC: 0.5 % — ABNORMAL HIGH (ref 0.0–0.2)

## 2021-01-13 LAB — BASIC METABOLIC PANEL
Anion gap: 6 (ref 5–15)
BUN: 15 mg/dL (ref 8–23)
CO2: 29 mmol/L (ref 22–32)
Calcium: 8.2 mg/dL — ABNORMAL LOW (ref 8.9–10.3)
Chloride: 105 mmol/L (ref 98–111)
Creatinine, Ser: 0.84 mg/dL (ref 0.61–1.24)
GFR, Estimated: >60 mL/min (ref >60)
Glucose, Bld: 81 mg/dL (ref 70–99)
Potassium: 3.9 mmol/L (ref 3.5–5.1)
Sodium: 140 mmol/L (ref 135–145)

## 2021-01-13 MED ORDER — ENSURE ENLIVE PO LIQD: 237.0000 mL | Freq: Three times a day (TID) | ORAL | 12 refills | Status: DC

## 2021-01-13 MED ORDER — THIAMINE HCL 100 MG PO TABS
100.0000 mg | ORAL_TABLET | Freq: Every day | ORAL | Status: AC
Start: 2021-01-14 — End: ?

## 2021-01-13 MED ORDER — ACETAMINOPHEN 325 MG PO TABS: 650.0000 mg | ORAL_TABLET | Freq: Four times a day (QID) | ORAL | Status: AC | PRN

## 2021-01-13 MED ORDER — FOLIC ACID 1 MG PO TABS
1.0000 mg | ORAL_TABLET | Freq: Every day | ORAL | Status: AC
Start: 2021-01-14 — End: ?

## 2021-01-13 MED ORDER — ADULT MULTIVITAMIN W/MINERALS CH: 1.0000 | ORAL_TABLET | Freq: Every day | ORAL | Status: AC

## 2021-01-13 MED ORDER — PANTOPRAZOLE SODIUM 20 MG PO TBEC: 20.0000 mg | DELAYED_RELEASE_TABLET | Freq: Every day | ORAL | Status: AC

## 2021-01-13 MED ORDER — DIPHENHYDRAMINE-ZINC ACETATE 2-0.1 % EX CREA: TOPICAL_CREAM | Freq: Three times a day (TID) | CUTANEOUS | 0 refills | Status: DC | PRN

## 2021-01-13 NOTE — Discharge Summary (Addendum)
[facility] Discharge Summary  Patient name: Mason Hart Medical record number: [id number] Date of birth: 1956-05-27 Age: 65 y.o. Gender: male Date of Admission: 01/02/2021  Date of Discharge: 01/13/2021 Admitting Physician: Karren Burly, MD  Primary Care Provider: [name], NP Consultants: CCM, palliative, heme-onc  Indication for Hospitalization: Acute encephalopathy   Discharge Diagnoses/Problem List:  Hypoglycemia Hypothermia Acute on Chronic Normocytic Anemia  Alcohol use disorder  Protein calorie malnutrition  Disposition: [facility] (SNF)  Discharge Condition: Stable   Discharge Exam:  Temp:  [98 F (36.7 C)-98.4 F (36.9 C)] 98.4 F (36.9 C) (02/17 0445) Pulse Rate:  [70-85] 70 (02/17 0445) Resp:  [17-20] 17 (02/17 0445) BP: (94-114)/(47-58) 112/55 (02/17 0445) SpO2:  [97 %-100 %] 97 % (02/17 0445) Weight:  [97 kg] 97 kg (02/16 2022)  Physical Exam: General: alert, pleasant, NAD Cardiovascular: RRR no murmurs Respiratory: CTAB. Normal WOB Abdomen: soft, non distended, non tender Extremities: warm, dry. No edema  Skin: Skin of the upper and lower extremities was notable for thickened almost lichenified skin.  No evidence of injury or skin breakdown.  Brief Hospital Course:   Mason Hart is a 65 y.o. male who presented with altered mental status after being found down. PMH is significant for alcohol use disorder, hypertension, hypothyroidism and seizure disorder.  Altered Mental Status  Sepsis- ruled out Patient presented with acute encephalopathy. CT head was unremarkable. He was treated with antibiotics (Ceftriaxone and Azithromycin, then broadened to Unasyn) due to concern for sepsis and possible PNA on CXR.  He did spend 2 days in the ICU on pressors, fluids and broad-spectrum antibiotics.  However, procal was low at <0.10 so abx were discontinued on 2/8. His mental status gradually improved and he returned to  baseline. Etiology ultimately thought to be multifactorial secondary to alcohol use, possible seizure, hypothermia, and hypoglycemia.  Hypotension Patient was hypotensive with MAPs in the 60s on admission. His BP did not improve despite aggressive fluid resuscitation (~8L and 3u pRBCs). He required levophed in the ICU x2 days but was subsequently able to maintain adequate pressures.  In the 24 hours prior to hospital discharge, his systolic pressures remain between 90-120.  He was not experiencing any symptoms of hypotension with these blood pressures.  Hypothermia Patient noted to have temperature of 87.7 on admission. It improved gradually with warm IV fluids and bair hugger. Not related to environmental exposure. Adrenal insufficiency ruled out with normal am cortisol. Thyroid studies wnl. MRI brain unremarkable. He was able to maintain temp between 97.5-98.5*F for several days prior to discharge.  Acute on Chronic Normocytic Anemia Patient presented with hemoglobin 5.9. He was given 3u pRBCs and hemoglobin subsequently stabilized ~8.7. Heme-onc was consulted due to schistocytes on peripheral smear. Ultimately the etiology of his anemia thought to be multifactorial 2/2 alcohol use, critical illness, seizure medication, and nutrition deficiency.  Hypoglycemia Patient's POC glucose checks were intermittently low despite adequate PO intake. Endocrinology was curbsided on 2/14 and felt his fingerstick glucoses were not reflective of his true glucose levels. His blood glucose was monitored and no further intervention was needed.   Issues for Follow Up:  1. Patient's blood pressure has been low this admission. Continue to monitor BP outpatient 2. Check CBC and CMP on 2/21 3. Repeat TSH in 4-6 weeks 4. F/u with GI regarding anemia  5. F/u with Neuro regarding seizure disorder. Currently on Keppra 1000mg  BID  6. Continue PPI for 8 weeks or until GI appointment. Do  not continue long term    Significant Procedures: None  Significant Labs and Imaging:  Recent Labs  Lab 01/11/21 0246 01/12/21 0643 01/13/21 0359  WBC 10.4 6.4 6.4  HGB 7.8* 7.7* 7.4*  HCT 25.9* 24.7* 24.0*  PLT 160 207 296   Recent Labs  Lab 01/07/21 0249 01/08/21 0128 01/09/21 0750 01/10/21 0813 01/11/21 0246 01/12/21 0643 01/13/21 0359  NA 146* 143 142 141 140 141 140  K 3.4* 4.0 3.9 3.9 4.2 3.7 3.9  CL 116* 115* 111 108 108 106 105  CO2 25 22 26 26 27 28 29   GLUCOSE 79 78 56* 87 79 74 81  BUN 16 15 10 15 16 12 15   CREATININE 1.12 0.93 0.78 1.01 0.80 0.76 0.84  CALCIUM 8.0* 8.0* 8.1* 8.3* 8.2* 8.1* 8.2*  MG 2.2  --   --   --   --   --   --   PHOS 2.7 2.3*  --   --   --   --   --      Results/Tests Pending at Time of Discharge: None   Discharge Medications:  Allergies as of 01/13/2021   No Known Allergies     Medication List    TAKE these medications   acetaminophen 325 MG tablet Commonly known as: TYLENOL Take 2 tablets (650 mg total) by mouth every 6 (six) hours as needed for mild pain (or Fever >/= 101).   diphenhydrAMINE-zinc acetate cream Commonly known as: BENADRYL Apply topically 3 (three) times daily as needed for itching.   feeding supplement Liqd Take 237 mLs by mouth 3 (three) times daily between meals.   ferrous sulfate 325 (65 FE) MG tablet Take 1 tablet (325 mg total) by mouth daily.   folic acid 1 MG tablet Commonly known as: FOLVITE Take 1 tablet (1 mg total) by mouth daily. Start taking on: January 14, 2021   levETIRAcetam 1000 MG tablet Commonly known as: KEPPRA Take 1 tablet (1,000 mg total) by mouth 2 (two) times daily. Last fill! Must keep appointment.   multivitamin with minerals Tabs tablet Take 1 tablet by mouth daily. Start taking on: January 14, 2021   pantoprazole 20 MG tablet Commonly known as: PROTONIX Take 1 tablet (20 mg total) by mouth daily. Start taking on: January 14, 2021   thiamine 100 MG tablet Take 1 tablet (100 mg  total) by mouth daily. Start taking on: January 14, 2021       Discharge Instructions: Please refer to Patient Instructions section of EMR for full details.  Patient was counseled important signs and symptoms that should prompt return to medical care, changes in medications, dietary instructions, activity restrictions, and follow up appointments.   Follow-Up Appointments:   January 16, 2021, DO 01/13/2021, 12:12 PM PGY-1, [facility]  FPTS Upper-Level Resident Addendum   I have independently interviewed and examined the patient. I have discussed the above with the original author and agree with their documentation. My edits for correction/addition/clarification are in blue. Please see also any attending notes.    Cora Collum MD PGY-3,  Family Medicine 01/13/2021 12:44 PM  FPTS Service pager: [phone] (text pages welcome through [facility])

## 2021-01-13 NOTE — Progress Notes (Signed)
OT Cancellation Note  Patient Details Name: Mason Hart MRN: [id number] DOB: 1956-06-10   Cancelled Treatment:    Reason Eval/Treat Not Completed: Other (comment) Pt eating lunch on entry. Will follow-up for OT session after lunch with pt agreeable.  Lorre Munroe 01/13/2021, 12:04 PM

## 2021-01-13 NOTE — Progress Notes (Addendum)
  Speech Language Pathology Treatment: Dysphagia  Patient Details Name: Mason Hart MRN: [id number] DOB: 05-13-1956 Today's Date: 01/13/2021 Time: 4098-1191 SLP Time Calculation (min) (ACUTE ONLY): 13 min  Assessment / Plan / Recommendation Clinical Impression  Pt seen for ongoing dysphagia management.  RN reports no noted difficulty.  Pt was unable to recall swallow strategies from prior sessions.  Reminded pt of results of MBSS and rationale for swallow strategies with anatomical differences noted on pt's assessment.  Provided education regarding aspiration pneumonia.  Pt verbal cuing to initiate use of swallow strategies and used them with fair accuracy during PO trials.  SLP left to get additional snacks.  Pt required further cuing on SLP return to use swallow strategies.  Pt was noted to take large bites of regular solids independently, but did not seem to have any difficulty with oral clearance.  Pt took serial straw sips of thin liquid.  There was occasional throat clear in addition to volitional throat clear with swallow strategy usage.  Recommend continuing regular texture diet with thin liquids.  Pt would benefit from supervision with meals to encourage swallow strategy use as he is not independent with throat clear and re-swallow.  Recommend continued ST at next venue of care to reinforce swallow strategies.    HPI HPI: Pt is a 65 y.o. male with PMH is significant for alcohol use disorder, hypertension, hypothyroidism and seizure disorder. He presented with extremity weakness and altered mental status; admitted with hypotension, rhabdomyolysis, acute on chronic anemia, and sepsis suspicious for pneumonia treating with CAP coverage. Pt loaded with Keppra in ED.  CXR 2/9: No substantial change in the right greater than left basilar airspace disease with small left pleural effusion. Cortrak scheduled to be placed 2/9. MBS 03/20/19: moderate oropharyngeal dysphagia; Oral phase with decreased  oral control due to weakess with premature spillage of boluses into pharynx/larynx.  Also laryngeal penetration of thin and nectar apparent due to decreased epiglottic deflection and timing of laryngeal closure.  Mild vallecular residuals present without pt awareness.  A dysphagia 1 diet with thin liquids was recommended at that time with subsequent advancement to regular thin by 03/24/19.      SLP Plan  Continue with current plan of care       Recommendations  Diet recommendations: Regular;Thin liquid Liquids provided via: Cup;Straw Medication Administration: Whole meds with puree Supervision: Full supervision/cueing for compensatory strategies;Intermittent supervision to cue for compensatory strategies Compensations: Slow rate;Small sips/bites;Clear throat after each swallow;Multiple dry swallows after each bite/sip Postural Changes and/or Swallow Maneuvers: Seated upright 90 degrees                Oral Care Recommendations: Oral care BID Follow up Recommendations: Skilled Nursing facility SLP Visit Diagnosis: Dysphagia, oropharyngeal phase (R13.12) Plan: Continue with current plan of care       GO                Kerrie Pleasure, MA, CCC-SLP Acute Rehabilitation Services Office: [phone] 01/13/2021, 12:47 PM

## 2021-01-13 NOTE — Progress Notes (Signed)
DISCHARGE NOTE SNF [facility] to be discharged [facility] per MD order. Patient verbalized understanding.  Skin clean, dry and intact without evidence of skin break down, no evidence of skin tears noted. IV catheter discontinued intact. Site without signs and symptoms of complications. Dressing and pressure applied. Pt denies pain at the site currently. No complaints noted.  Patient free of lines, drains, and wounds.   Discharge packet assembled. An After Visit Summary (AVS) was printed and given to the EMS personnel. Patient escorted via stretcher and discharged to [facility] via ambulance. Report called to accepting facility; all questions and concerns addressed.   Annia Belt, RN

## 2021-01-13 NOTE — Progress Notes (Signed)
Occupational Therapy Treatment Patient Details Name: Mason Hart MRN: [id number] DOB: [birth year] Today's Date: 01/13/2021    History of present illness 65 y.o. man admitted on 2/6/22with AMS, likely from sepsis of unknown source, CXR suspicious for bil opacity, rhabdomyolysis, acute on chronic anemia, seizure disorder with history of non- compliance. Other significant PMH includes RBBB, HTN, ETOH abuse.   OT comments  On attempts to engage pt in OOB activities, pt noted with large bowel incontinence in bed. Pt appeared unaware of this and made no mention of it on entry. Extended time spent with NT for Total A clean-up at bed level. Pt able to demo rolling side to side at Mod A, slow to respond but follows simple commands consistently. Continue to recommend SNF at discharge.    Follow Up Recommendations  SNF;Supervision/Assistance - 24 hour    Equipment Recommendations  3 in 1 bedside commode    Recommendations for Other Services      Precautions / Restrictions Precautions Precautions: Fall Precaution Comments: flaking skin, bil UE edema and LLE edema; incontinence Restrictions Weight Bearing Restrictions: No       Mobility Bed Mobility Overal bed mobility: Needs Assistance Bed Mobility: Rolling Rolling: Mod assist         General bed mobility comments: Grossly Mod A 1-2 fo rolling in bed, multimodal cues  Transfers                      Balance                                           ADL either performed or assessed with clinical judgement   ADL Overall ADL's : Needs assistance/impaired                 Upper Body Dressing : Moderate assistance;Bed level Upper Body Dressing Details (indicate cue type and reason): Mod A to don clean gown in bed         Toileting- Clothing Manipulation and Hygiene: Total assistance;+2 for physical assistance;+2 for safety/equipment;Bed level Toileting - Clothing Manipulation Details (indicate  cue type and reason): BM incontinence on entry, +2 for thorough cleanup bed level       General ADL Comments: Pt limited by decreased strength, decreased mobility, decreased activity tolerance and decreased ability to follow commands.     Vision   Vision Assessment?: No apparent visual deficits   Perception     Praxis      Cognition Arousal/Alertness: Awake/alert Behavior During Therapy: Flat affect Overall Cognitive Status: No family/caregiver present to determine baseline cognitive functioning Area of Impairment: Orientation;Memory;Problem solving;Awareness                 Orientation Level: Disoriented to;Place;Time   Memory: Decreased short-term memory     Awareness: Intellectual Problem Solving: Slow processing;Difficulty sequencing;Requires verbal cues General Comments: able to follow one step commands, decreased awareness - with bowel incontinence on entry and pt did not report this. slow to respond        Exercises     Shoulder Instructions       General Comments VSS on RA. NT in to assist with cleanup, RN in to change dressings on bottom. Pt noted to impulsively scratch skin - cues needed to stop to prevent bleeding    Pertinent Vitals/ Pain       Pain Assessment: Faces Faces  Pain Scale: Hurts a little bit Pain Location: with condom cath changing Pain Descriptors / Indicators: Discomfort;Grimacing Pain Intervention(s): Limited activity within patient's tolerance;Monitored during session  Home Living                                          Prior Functioning/Environment              Frequency  Min 2X/week        Progress Toward Goals  OT Goals(current goals can now be found in the care plan section)  Progress towards OT goals: OT to reassess next treatment  Acute Rehab OT Goals Patient Stated Goal: none stated OT Goal Formulation: With patient/family Time For Goal Achievement: 01/19/21 Potential to Achieve Goals:  Fair ADL Goals Pt Will Perform Grooming: with supervision;sitting Pt Will Perform Upper Body Dressing: with min assist;sitting Pt Will Perform Lower Body Dressing: with mod assist;sitting/lateral leans;sit to/from stand;with adaptive equipment Pt Will Transfer to Toilet: with min assist;stand pivot transfer;bedside commode Pt Will Perform Toileting - Clothing Manipulation and hygiene: with mod assist;sitting/lateral leans;sit to/from stand Pt/caregiver will Perform Home Exercise Program: Increased strength;Both right and left upper extremity Additional ADL Goal #1: Pt will increase to minA overall for bed mobility as precursor for OOB ADL.  Plan Discharge plan remains appropriate    Co-evaluation                 AM-PAC OT "6 Clicks" Daily Activity     Outcome Measure   Help from another person eating meals?: A Little Help from another person taking care of personal grooming?: A Little Help from another person toileting, which includes using toliet, bedpan, or urinal?: Total Help from another person bathing (including washing, rinsing, drying)?: Total Help from another person to put on and taking off regular upper body clothing?: A Lot Help from another person to put on and taking off regular lower body clothing?: Total 6 Click Score: 11    End of Session    OT Visit Diagnosis: Unsteadiness on feet (R26.81);Muscle weakness (generalized) (M62.81);Pain;Other symptoms and signs involving cognitive function   Activity Tolerance Treatment limited secondary to medical complications (Comment)   Patient Left in bed;with call bell/phone within reach;with bed alarm set;with nursing/sitter in room   Nurse Communication Mobility status        Time: 5701-7793 OT Time Calculation (min): 39 min  Charges: OT General Charges $OT Visit: 1 Visit OT Treatments $Self Care/Home Management : 38-52 mins  Bradd Canary, OTR/L Acute Rehab Services Office: [phone]   Lorre Munroe 01/13/2021, 2:40 PM

## 2021-01-13 NOTE — TOC Transition Note (Signed)
Transition of Care [facility]) - CM/SW Discharge Note *Discharged to [facility]   Patient Details  Name: Mason Hart MRN: [id number] Date of Birth: 02-23-1956  [facility]) CM/SW Contact:  Cristobal Goldmann, LCSW Phone Number: 01/13/2021, 2:18 PM   Clinical Narrative:  Patient medically stable for discharge and going to [facility] for ST rehab. Discharge clinicals transmitted to facility and non-emergency ambulance transport arranged. Patient's son, [name] contacted and informed regarding discharge and transport to facility. Nurse provided with information to call report.    Final next level of care: Skilled Nursing Facility ([facility] - Room 113) Barriers to Discharge: Continued Medical Work up   Patient Goals and CMS Choice Patient states their goals for this hospitalization and ongoing recovery are:: to go to rehab   Choice offered to / list presented to : Patient  Discharge Placement   Existing PASRR number confirmed : 01/06/21          Patient chooses bed at: Other - please specify in the comment section below: ([facility]) Patient to be transferred to facility by: Non-emergency ambulance transport Name of family member notified: [name] - son; [phone] Patient and family notified of of transfer: 01/13/21  Discharge Plan and Services                                    Social Determinants of Health (SDOH) Interventions  No SDOH interventions requested or needed prior to discharge   Readmission Risk Interventions Readmission Risk Prevention Plan 03/23/2019  Transportation Screening Complete  PCP or Specialist Appt within 5-7 Days Not Complete  Home Care Screening Complete  Medication Review (RN CM) Not Complete  Some recent data might be hidden

## 2022-11-27 ENCOUNTER — Inpatient Hospital Stay (HOSPITAL_COMMUNITY)
Admission: EM | Admit: 2022-11-27 | Discharge: 2022-11-29 | DRG: 071 | Disposition: A | Discharge status: Skilled Nursing Facility | Payer: Medicare PPO | Source: Skilled Nursing Facility | Attending: Infectious Diseases | Admitting: Infectious Diseases

## 2022-11-27 ENCOUNTER — Emergency Department (HOSPITAL_COMMUNITY): Payer: Medicare PPO

## 2022-11-27 ENCOUNTER — Other Ambulatory Visit: Payer: Self-pay

## 2022-11-27 DIAGNOSIS — N390 Urinary tract infection, site not specified: Secondary | ICD-10-CM | POA: Diagnosis present

## 2022-11-27 DIAGNOSIS — G9340 Encephalopathy, unspecified: Secondary | ICD-10-CM

## 2022-11-27 DIAGNOSIS — Z6832 Body mass index (BMI) 32.0-32.9, adult: Secondary | ICD-10-CM

## 2022-11-27 DIAGNOSIS — N3000 Acute cystitis without hematuria: Secondary | ICD-10-CM

## 2022-11-27 DIAGNOSIS — K7460 Unspecified cirrhosis of liver: Secondary | ICD-10-CM | POA: Diagnosis present

## 2022-11-27 DIAGNOSIS — G40901 Epilepsy, unspecified, not intractable, with status epilepticus: Secondary | ICD-10-CM | POA: Diagnosis present

## 2022-11-27 DIAGNOSIS — I10 Essential (primary) hypertension: Secondary | ICD-10-CM | POA: Diagnosis present

## 2022-11-27 DIAGNOSIS — E46 Unspecified protein-calorie malnutrition: Secondary | ICD-10-CM | POA: Diagnosis present

## 2022-11-27 DIAGNOSIS — Z79899 Other long term (current) drug therapy: Secondary | ICD-10-CM | POA: Diagnosis not present

## 2022-11-27 DIAGNOSIS — F1090 Alcohol use, unspecified, uncomplicated: Secondary | ICD-10-CM | POA: Diagnosis present

## 2022-11-27 DIAGNOSIS — E039 Hypothyroidism, unspecified: Secondary | ICD-10-CM | POA: Diagnosis present

## 2022-11-27 DIAGNOSIS — R4182 Altered mental status, unspecified: Secondary | ICD-10-CM | POA: Diagnosis not present

## 2022-11-27 DIAGNOSIS — D649 Anemia, unspecified: Secondary | ICD-10-CM | POA: Diagnosis present

## 2022-11-27 DIAGNOSIS — I4510 Unspecified right bundle-branch block: Secondary | ICD-10-CM | POA: Diagnosis present

## 2022-11-27 DIAGNOSIS — G3184 Mild cognitive impairment, so stated: Secondary | ICD-10-CM | POA: Diagnosis present

## 2022-11-27 DIAGNOSIS — E669 Obesity, unspecified: Secondary | ICD-10-CM | POA: Diagnosis present

## 2022-11-27 DIAGNOSIS — J9811 Atelectasis: Secondary | ICD-10-CM | POA: Diagnosis present

## 2022-11-27 DIAGNOSIS — G9341 Metabolic encephalopathy: Secondary | ICD-10-CM | POA: Diagnosis present

## 2022-11-27 DIAGNOSIS — G40909 Epilepsy, unspecified, not intractable, without status epilepticus: Secondary | ICD-10-CM | POA: Diagnosis not present

## 2022-11-27 LAB — I-STAT ARTERIAL BLOOD GAS, ED
Acid-base deficit: 1 mmol/L (ref 0.0–2.0)
Bicarbonate: 25.4 mmol/L (ref 20.0–28.0)
Calcium, Ion: 1.27 mmol/L (ref 1.15–1.40)
HCT: 41 % (ref 39.0–52.0)
Hemoglobin: 13.9 g/dL (ref 13.0–17.0)
O2 Saturation: 97 %
Patient temperature: 98.9
Potassium: 3.9 mmol/L (ref 3.5–5.1)
Sodium: 138 mmol/L (ref 135–145)
TCO2: 27 mmol/L (ref 22–32)
pCO2 arterial: 49.5 mmHg — ABNORMAL HIGH (ref 32–48)
pH, Arterial: 7.32 — ABNORMAL LOW (ref 7.35–7.45)
pO2, Arterial: 97 mmHg (ref 83–108)

## 2022-11-27 LAB — URINALYSIS, ROUTINE W REFLEX MICROSCOPIC
Bilirubin Urine: NEGATIVE
Glucose, UA: NEGATIVE mg/dL
Ketones, ur: 5 mg/dL — AB
Nitrite: NEGATIVE
Protein, ur: NEGATIVE mg/dL
Specific Gravity, Urine: 1.013 (ref 1.005–1.030)
pH: 5 (ref 5.0–8.0)

## 2022-11-27 LAB — CBC
HCT: 46.7 % (ref 39.0–52.0)
Hemoglobin: 15.1 g/dL (ref 13.0–17.0)
MCH: 29.3 pg (ref 26.0–34.0)
MCHC: 32.3 g/dL (ref 30.0–36.0)
MCV: 90.7 fL (ref 80.0–100.0)
Platelets: 228 10*3/uL (ref 150–400)
RBC: 5.15 MIL/uL (ref 4.22–5.81)
RDW: 12.4 % (ref 11.5–15.5)
WBC: 7.9 10*3/uL (ref 4.0–10.5)
nRBC: 0 % (ref 0.0–0.2)

## 2022-11-27 LAB — COMPREHENSIVE METABOLIC PANEL
ALT: 11 U/L (ref 0–44)
AST: 18 U/L (ref 15–41)
Albumin: 3.3 g/dL — ABNORMAL LOW (ref 3.5–5.0)
Alkaline Phosphatase: 98 U/L (ref 38–126)
Anion gap: 12 (ref 5–15)
BUN: 9 mg/dL (ref 8–23)
CO2: 23 mmol/L (ref 22–32)
Calcium: 8.9 mg/dL (ref 8.9–10.3)
Chloride: 100 mmol/L (ref 98–111)
Creatinine, Ser: 0.93 mg/dL (ref 0.61–1.24)
GFR, Estimated: >60 mL/min (ref >60)
Glucose, Bld: 96 mg/dL (ref 70–99)
Potassium: 3.8 mmol/L (ref 3.5–5.1)
Sodium: 135 mmol/L (ref 135–145)
Total Bilirubin: 1.1 mg/dL (ref 0.3–1.2)
Total Protein: 7.6 g/dL (ref 6.5–8.1)

## 2022-11-27 LAB — LACTIC ACID, PLASMA: Lactic Acid, Venous: 1.4 mmol/L (ref 0.5–1.9)

## 2022-11-27 LAB — TROPONIN I (HIGH SENSITIVITY)
Troponin I (High Sensitivity): 6 ng/L (ref <18)
Troponin I (High Sensitivity): 4 ng/L (ref <18)

## 2022-11-27 LAB — ETHANOL: Alcohol, Ethyl (B): <10 mg/dL (ref <10)

## 2022-11-27 LAB — AMMONIA: Ammonia: 45 umol/L — ABNORMAL HIGH (ref 9–35)

## 2022-11-27 MED ORDER — POLYETHYLENE GLYCOL 3350 17 G PO PACK: 17.0000 g | PACK | Freq: Every day | ORAL | Status: DC | PRN

## 2022-11-27 MED ORDER — LEVETIRACETAM IN NACL 1000 MG/100ML IV SOLN
1000.0000 mg | Freq: Two times a day (BID) | INTRAVENOUS | Status: DC
  Administered 2022-11-28 (×3): 1000 mg via INTRAVENOUS
  Filled 2022-11-27 (×3): qty 100

## 2022-11-27 MED ORDER — SODIUM CHLORIDE 0.9 % IV SOLN
1.0000 g | Freq: Once | INTRAVENOUS | Status: AC
  Administered 2022-11-27: 1 g via INTRAVENOUS
  Filled 2022-11-27: qty 10

## 2022-11-27 MED ORDER — LEVETIRACETAM 500 MG PO TABS: 1000.0000 mg | ORAL_TABLET | Freq: Two times a day (BID) | ORAL | Status: DC

## 2022-11-27 MED ORDER — SENNOSIDES-DOCUSATE SODIUM 8.6-50 MG PO TABS: 1.0000 | ORAL_TABLET | Freq: Every evening | ORAL | Status: DC | PRN

## 2022-11-27 MED ORDER — ACETAMINOPHEN 325 MG PO TABS: 650.0000 mg | ORAL_TABLET | Freq: Four times a day (QID) | ORAL | Status: DC | PRN

## 2022-11-27 MED ORDER — ENOXAPARIN SODIUM 40 MG/0.4ML IJ SOSY
40.0000 mg | PREFILLED_SYRINGE | Freq: Every day | INTRAMUSCULAR | Status: DC
  Administered 2022-11-28 – 2022-11-29 (×2): 40 mg via SUBCUTANEOUS
  Filled 2022-11-27 (×2): qty 0.4

## 2022-11-27 MED ORDER — LACTATED RINGERS IV SOLN: INTRAVENOUS | Status: AC

## 2022-11-27 MED ORDER — SODIUM CHLORIDE 0.9 % IV BOLUS
1000.0000 mL | Freq: Once | INTRAVENOUS | Status: AC
Start: 2022-11-27 — End: 2022-11-27
  Administered 2022-11-27: 1000 mL via INTRAVENOUS

## 2022-11-27 MED ORDER — ACETAMINOPHEN 650 MG RE SUPP: 650.0000 mg | Freq: Four times a day (QID) | RECTAL | Status: DC | PRN

## 2022-11-27 MED ORDER — FOLIC ACID 1 MG PO TABS
1.0000 mg | ORAL_TABLET | Freq: Every day | ORAL | Status: DC
  Administered 2022-11-28 – 2022-11-29 (×2): 1 mg via ORAL
  Filled 2022-11-27 (×2): qty 1

## 2022-11-27 NOTE — H&P (Incomplete)
Date: 11/27/2022               Patient Name:  Mason Hart MRN: [id number]  DOB: May 13, 1956 Age / Sex: 67 y.o., male   PCP: [name], NP         Medical Service: Internal Medicine Teaching Service         Attending Physician: Dr. Loetta Rough, MD    First Contact: Willette Cluster, MD      Pager: [phone]      Second Contact: Rudene Christians, DO      Pager: Milinda Pointer [phone]           After Hours (After 5p/  First Contact Pager: [phone]  weekends / holidays): Second Contact Pager: [phone]   SUBJECTIVE   Chief Complaint: altered mental status   History of Present Illness:  Mr Dutko is a 67 year old male with a past medical history of seizure disorder, alcohol use, hepatic cirrhosis, hypothyroidism, hypertension, anemia, and malnutrition who was delivered by EMS from [facility] for altered mental status.  According to [facility] staff, the patient was found unresponsive at around 8:00 on 1-1 with warm skin and shallow breathing. He awoke to sternal rub and seemed confused compared to his baseline. Normally, the patient is largely independent and can ambulate to the bathroom without any assistance. He requires help with some activities of daily living.  During initial encounter with primary team, the patient was unable to recall any events that would have led to his hospitalization. Most of his responses to questioning were short but he was fully oriented, except to president, and could perform basic financial calculations. He denied experiencing any symptoms including fever, chills, sweats, nausea, vomiting, dysuria, dyspnea, pain, headache, and bowel or bladder changes. Additionally, he denied feeling confused or lethargic prior to his arrival.   Of note, the patient confirms that he takes levetiracetam on a daily basis. Patient has been seizure-free since at least 08-2022.     ED Course: Upon arrival to the ED, vitals notable for HR 109. Laboratory  testing demonstrated Alb 3.3, Hgb 7.4, nRBC 0.5, and Amm 45. Urinalysis positive for hemoglobin, ketones, leukocytes, and bacteria. Head CT revealed possible acute left PCA territory infarction that is possibly artifactual. Abdominal radiograph showed moderate stool burden within rectum in nonobstructive pattern. Low lung volumes with right base atelectasis identified on chest radiograph. Brain MRI revealed periventricular and subcortical hyperintensities bilaterally that likely represent chronic microvascular changes. Ceftriaxone and 1L NS bolus were administered in the ED.    Meds:  No outpatient medications have been marked as taking for the 11/27/22 encounter [facility] Encounter).    Past Medical History  Past Surgical History:  Procedure Laterality Date  . NO PAST SURGERIES       Social:  Lives With: [facility] Occupation: none Support: family Level of Function: requires assistance with some ADLs PCP: [name] NP Substances: drinks alcohol     Allergies: Allergies as of 11/27/2022  . (No Known Allergies)      Review of Systems: A complete ROS was negative except as per HPI.    OBJECTIVE:   Physical Exam: Blood pressure (!) 104/58, pulse 85, temperature 98.9 F (37.2 C), temperature source Oral, resp. rate 15, SpO2 98 %.   General:      awake and alert, lying comfortably in bed, cooperative, not in acute distress Skin:       warm and dry, intact without any obvious lesions or scars, no  rashes Head:      normocephalic and atraumatic, oral mucosa moist with good dentition, no lymphadenopathy Eyes:      extraocular movements intact, conjunctivae pink, pupils round and reactive to light, no periorbital swelling or scleral icterus Ears:       pinnae normal, no discharge or external lesions  Nose:      symmetrical and without mucosal inflammation, no external lesions or discharge Lungs:      normal respiratory effort, breathing unlabored, symmetrical  chest rise, no crackles or wheezing Cardiac:      regular rate and rhythm, normal S1 and S2, capillary refill 2-3 seconds, no pitting edema Abdomen:      soft and non-distended, normoactive bowel sounds present in all four quadrants, no tenderness to palpation or guarding Musculoskeletal:  full range of motion in joints, motor strength 5 /5 in all four extremities, no obvious deformities or joint tenderness Neurologic:      oriented to person-place-time, moving all extremities, sensation to light touch and pinprick sensation intact, no gross focal deficits Psychiatric:      euthymic mood with congruent affect, intelligible speech   Labs: CBC    Component Value Date/Time   WBC 7.9 11/27/2022 0908   RBC 5.15 11/27/2022 0908   HGB 15.1 11/27/2022 0908   HGB 11.6 (L) 04/13/2020 1540   HCT 46.7 11/27/2022 0908   HCT 35.7 (L) 04/13/2020 1540   PLT 228 11/27/2022 0908   PLT 302 04/13/2020 1540   MCV 90.7 11/27/2022 0908   MCV 89 04/13/2020 1540   MCH 29.3 11/27/2022 0908   MCHC 32.3 11/27/2022 0908   RDW 12.4 11/27/2022 0908   RDW 13.9 04/13/2020 1540   LYMPHSABS 1.9 01/09/2021 0750   MONOABS 0.5 01/09/2021 0750   EOSABS 1.2 (H) 01/09/2021 0750   BASOSABS 0.0 01/09/2021 0750     CMP     Component Value Date/Time   NA 135 11/27/2022 0908   NA 133 (L) 04/13/2020 1540   K 3.8 11/27/2022 0908   CL 100 11/27/2022 0908   CO2 23 11/27/2022 0908   GLUCOSE 96 11/27/2022 0908   BUN 9 11/27/2022 0908   BUN 25 04/13/2020 1540   CREATININE 0.93 11/27/2022 0908   CALCIUM 8.9 11/27/2022 0908   PROT 7.6 11/27/2022 0908   PROT 7.9 04/13/2020 1540   ALBUMIN 3.3 (L) 11/27/2022 0908   ALBUMIN 3.9 04/13/2020 1540   AST 18 11/27/2022 0908   ALT 11 11/27/2022 0908   ALKPHOS 98 11/27/2022 0908   BILITOT 1.1 11/27/2022 0908   BILITOT 0.6 04/13/2020 1540   GFRNONAA >60 11/27/2022 0908   GFRAA 82 04/13/2020 1540     Imaging:  ***   ECG: My personal interpretation is ***, which is  *** from prior ECG on ***    ASSESSMENT & PLAN:   Assessment & Plan by Problem: Active Problems:   * No active hospital problems. *   Mr Creque is a 67 year old male with a past medical history of seizure disorder, alcohol use, hepatic cirrhosis, hypothyroidism, hypertension, anemia, and malnutrition who was delivered by EMS from SNF with altered mental status, now admitted for management of urinary tract infection.   ---Altered mental status Patient was delivered to [facility] ED via EMS from SNF for altered mental status and shallow breathing. Upon arrival, head CT and brain MRI were both negative for acute intracranial process. Radiographic imaging negative for bowel obstruction and lung pathology. Urinalysis demonstrated evidence of possible urinary tract infection, cultures  are currently pending. Patient has been breathing well on room air and his lung exam was unremarkable. He was fully oriented during initial encounter with the treatment team and appeared near his cognitive baseline. Etiology of altered mental status remains unclear at this time, perhaps a mild urinary tract infection given leukocytes and rare bacteria on urinalysis. Treatment with ceftriaxone was initiated in the ED. > Ceftriaxone xxx > *** > *** > *** > Follow urine cultures, no growth day 0 > Follow blood cultures, no growth day 0   ---Seizure disorder Patient has a history of seizures dating back to at least 2013 per electronic medical record managed with levetiracetam. Although hospitalized in 11-2018 for breakthrough status epilepticus due to non-compliance, he reports taking this medication regularly at [facility]. x Levetiracetam 1000mg  q12 > *** > *** > *** > *** > ***   ---Perihepatic ascites and slightly nodular liver ---History of alcohol abuse Abdominal ultrasound performed in 02-2019 identified perihepatic ascites and a slightly nodular liver, findings concerning for possible cirrhosis. However,  subsequent abdominal CT obtained in 12-2020 failed to identify evidence of cirrhosis and laboratory tests upon arrival including AST-ALT were unremarkable. > *** > Trend CMP q24 > *** > ***   ---Malnutrition ---Anemia Patient is chronically malnourished, albumin currently 3.3. He takes several supplements at home including ferrous sulfate, folic acid, and thiamine. Additionally, he is given Ensure formula three times daily at his SNF. Ferrous sulfate 325mg  P29 Folic acid 1mg  q24 Thiamine 100nmg q24 Ensure supplement q8 > *** > *** > ***   ---*** *** > *** > ***   ---*** *** > ***    Diet: {NAMES:3044014::"Normal","Heart Healthy","Carb-Modified","Renal","Carb/Renal","NPO","TPN","Tube Feeds"} VTE: {NAMES:3044014::"Heparin","Enoxaparin","SCDs","NOAC","None"} IVF: {NAMES:3044014::"None","NS","1/2 NS","LR","D5","D10"},{NAMES:3044014::"None","10cc/hr","25cc/hr","50cc/hr","75cc/hr","100cc/hr","110cc/hr","125cc/hr","Bolus"} Code: {NAMES:3044014::"Full","DNR","DNI","DNR/DNI","Comfort Care","Unknown"}  Prior to Admission Living Arrangement: {NAMES:3044014::"Home, living ***","SNF, ***","Homeless","***"} Anticipated Discharge Location: {NAMES:3044014::"Home","SNF","CIR","***"} Barriers to Discharge: ***  Dispo: Admit patient to {STATUS:3044014::"Observation with expected length of stay less than 2 midnights.","Inpatient with expected length of stay greater than 2 midnights."}  Signed: Serita Butcher, MD Internal Medicine Resident PGY-1  11/27/2022, 7:47 PM

## 2022-11-27 NOTE — ED Notes (Signed)
RN and NT attempted to in and out pt 3 times, all attempts unsuccessful. MD notified.

## 2022-11-27 NOTE — ED Notes (Signed)
Patient is resting comfortably. 

## 2022-11-27 NOTE — ED Provider Notes (Signed)
[facility] EMERGENCY DEPARTMENT Provider Note   CSN: [id number] Arrival date & time: 11/27/22  [id number]     History  Chief Complaint  Patient presents with   Altered Mental Status    Mason Hart is a 67 y.o. male.  Patient is a 67 year old male the past medical history of alcohol abuse, seizures on Keppra, hypertension and hypothyroidism presenting to the emergency department from his nursing home with altered mental status.  Per the nursing home, the patient was normal last night and when they went to do dressing changes on his bilateral leg wounds this morning he was found to be altered.  They state he is normally ANO x 4 at baseline.  Patient denies any complaints to me denies any headache, chest pain or abdominal pain.  He denies any nausea, vomiting or diarrhea or fevers.  The history is provided by the patient and the EMS personnel. The history is limited by the condition of the patient.  Altered Mental Status      Home Medications Prior to Admission medications   Medication Sig Start Date End Date Taking? Authorizing Provider  acetaminophen (TYLENOL) 325 MG tablet Take 2 tablets (650 mg total) by mouth every 6 (six) hours as needed for mild pain (or Fever >/= 101). 01/13/21   Matilde Haymaker, MD  diphenhydrAMINE-zinc acetate (BENADRYL) cream Apply topically 3 (three) times daily as needed for itching. 01/13/21   Matilde Haymaker, MD  feeding supplement (ENSURE ENLIVE / ENSURE PLUS) LIQD Take 237 mLs by mouth 3 (three) times daily between meals. 01/13/21   Matilde Haymaker, MD  ferrous sulfate 325 (65 FE) MG tablet Take 1 tablet (325 mg total) by mouth daily. 10/24/20 10/24/21  Barb Merino, MD  folic acid (FOLVITE) 1 MG tablet Take 1 tablet (1 mg total) by mouth daily. 01/14/21   Matilde Haymaker, MD  levETIRAcetam (KEPPRA) 1000 MG tablet Take 1 tablet (1,000 mg total) by mouth 2 (two) times daily. Last fill! Must keep appointment. 04/29/20 07/23/21  Marcial Pacas, MD  Multiple  Vitamin (MULTIVITAMIN WITH MINERALS) TABS tablet Take 1 tablet by mouth daily. 01/14/21   Matilde Haymaker, MD  pantoprazole (PROTONIX) 20 MG tablet Take 1 tablet (20 mg total) by mouth daily. 01/14/21   Matilde Haymaker, MD  thiamine 100 MG tablet Take 1 tablet (100 mg total) by mouth daily. 01/14/21   Matilde Haymaker, MD      Allergies    Patient has no known allergies.    Review of Systems   Review of Systems  Physical Exam Updated Vital Signs BP 104/61   Pulse (!) 101   Temp 99.1 F (37.3 C) (Oral)   Resp 20   SpO2 95%  Physical Exam Vitals and nursing note reviewed.  Constitutional:      General: He is not in acute distress.    Comments: Drowsy but arousable to verbal stimuli  HENT:     Head: Normocephalic and atraumatic.     Nose: Nose normal.     Mouth/Throat:     Mouth: Mucous membranes are moist.     Pharynx: Oropharynx is clear.  Eyes:     Extraocular Movements: Extraocular movements intact.     Conjunctiva/sclera: Conjunctivae normal.     Pupils: Pupils are equal, round, and reactive to light.  Cardiovascular:     Rate and Rhythm: Normal rate and regular rhythm.     Pulses: Normal pulses.     Heart sounds: Normal heart sounds.  Pulmonary:  Effort: Pulmonary effort is normal.     Breath sounds: Normal breath sounds.  Abdominal:     General: Abdomen is flat.     Palpations: Abdomen is soft.  Musculoskeletal:        General: Normal range of motion.     Cervical back: Normal range of motion and neck supple.     Comments: Bilateral legs wrapped in clean and dry dressing  Skin:    General: Skin is warm and dry.  Neurological:     Cranial Nerves: No cranial nerve deficit.     Sensory: No sensory deficit.     Motor: No weakness.     Comments: Oriented to person and place only  Psychiatric:        Mood and Affect: Mood normal.        Behavior: Behavior normal.     ED Results / Procedures / Treatments   Labs (all labs ordered are listed, but only abnormal results  are displayed) Labs Reviewed  COMPREHENSIVE METABOLIC PANEL - Abnormal; Notable for the following components:      Result Value   Albumin 3.3 (*)    All other components within normal limits  AMMONIA - Abnormal; Notable for the following components:   Ammonia 45 (*)    All other components within normal limits  CULTURE, BLOOD (ROUTINE X 2)  CULTURE, BLOOD (ROUTINE X 2)  LACTIC ACID, PLASMA  CBC  ETHANOL  URINALYSIS, ROUTINE W REFLEX MICROSCOPIC  TROPONIN I (HIGH SENSITIVITY)  TROPONIN I (HIGH SENSITIVITY)    EKG EKG Interpretation  Date/Time:  Monday November 27 2022 08:51:45 EST Ventricular Rate:  106 PR Interval:  173 QRS Duration: 142 QT Interval:  358 QTC Calculation: 476 R Axis:   106 Text Interpretation: Sinus tachycardia RBBB and LPFB No significant change since last tracing Confirmed by Leanord Asal ([id number]) on 11/27/2022 9:32:11 AM  Radiology DG Pelvis Portable  Result Date: 11/27/2022 CLINICAL DATA:  MRI clearance. EXAM: PORTABLE PELVIS 1-2 VIEWS COMPARISON:  None Available. FINDINGS: No unexpected radiopaque foreign bodies are noted. Telemetry leads overlie the pelvis. No definite acute bony abnormalities noted. IMPRESSION: No unexpected radiopaque foreign bodies. Telemetry leads overlie the pelvis. Electronically Signed   By: Margarette Canada M.D.   On: 11/27/2022 12:52   DG Chest 1 View  Result Date: 11/27/2022 CLINICAL DATA:  Altered mental status. EXAM: CHEST  1 VIEW COMPARISON:  01/05/2021 FINDINGS: Stable cardiomediastinal contours. Low lung volumes with asymmetric elevation of the right hemidiaphragm. Atelectasis noted in the right base. No pleural effusion or airspace disease. Chronic deformity involving the left clavicle. IMPRESSION: Low lung volumes with right base atelectasis. Electronically Signed   By: Kerby Moors M.D.   On: 11/27/2022 12:52   DG Abd 1 View  Result Date: 11/27/2022 CLINICAL DATA:  Altered mental status. EXAM: ABDOMEN - 1 VIEW COMPARISON:   None Available. FINDINGS: No dilated small bowel loops identified. Moderate stool burden identified with retained desiccated stool within the rectum. Degenerative changes noted within both hips and lumbar spine. IMPRESSION: 1. Nonobstructive bowel gas pattern. 2. Moderate stool burden within the rectum. Electronically Signed   By: Kerby Moors M.D.   On: 11/27/2022 12:51   CT Head Wo Contrast  Addendum Date: 11/27/2022   ADDENDUM REPORT: 11/27/2022 11:19 ADDENDUM: Study discussed by telephone with Dr. Leanord Asal on 11/27/2022 at 1057 hours. She advises the patient has altered mental status with no lateralize Ng weakness, but visual fields have not yet been evaluated. We  discussed that if necessary noncontrast Brain MRI should be conclusive. Electronically Signed   By: Genevie Ann M.D.   On: 11/27/2022 11:19   Result Date: 11/27/2022 CLINICAL DATA:  67 year old male with altered mental status. EXAM: CT HEAD WITHOUT CONTRAST TECHNIQUE: Contiguous axial images were obtained from the base of the skull through the vertex without intravenous contrast. RADIATION DOSE REDUCTION: This exam was performed according to the departmental dose-optimization program which includes automated exposure control, adjustment of the mA and/or kV according to patient size and/or use of iterative reconstruction technique. COMPARISON:  Head CT 01/03/2021.  Brain MRI 01/07/2021. FINDINGS: Brain: Stable cerebral volume. No midline shift, ventriculomegaly, mass effect, evidence of mass lesion, intracranial hemorrhage. Mild to moderate for age patchy cerebral white matter hypodensity. But appearance suspicious for cytotoxic edema in the left occipital lobe on series 3, image 9. Seemingly some mass effect on the left occipital horn there also. However, difficult to exclude artifact as the left cerebellum on the same coronal images also appears somewhat asymmetric. Otherwise no acute cortically based infarct. Vascular: No suspicious  intracranial vascular hyperdensity. Skull: Chronic right orbital floor ORIF. No acute osseous abnormality identified. Sinuses/Orbits: Chronic paranasal sinus disease with mucoperiosteal thickening. Improved left sphenoid aeration since 2022. Tympanic cavities and mastoids remain well aerated. Other: No acute orbit or scalp soft tissue finding identified. IMPRESSION: 1. Possible acute Left PCA territory infarct with cytotoxic edema in the left occipital lobe. However, this may be artifactual. 2. No acute intracranial hemorrhage or mass effect. No other acute intracranial abnormality. Electronically Signed: By: Genevie Ann M.D. On: 11/27/2022 10:45    Procedures Procedures    Medications Ordered in ED Medications  sodium chloride 0.9 % bolus 1,000 mL (has no administration in time range)    ED Course/ Medical Decision Making/ A&P Clinical Course as of 11/27/22 [place] Nov 27, 2022  1059 [facility] with ?L PCA territory infarct. Patient is not complaining of any visual field deficits but MRI Will be performed to further evaluate for possible stroke as cause of AMS. [VK]  [id number] Patient signed out to Dr. Mayra Neer pending MRI with plan for likely admission. [VK]    Clinical Course User Index [VK] Kemper Durie, DO                           Medical Decision Making This patient presents to the ED with chief complaint(s) of AMS with pertinent past medical history of ETOH abuse, seizure disorder, HTN, hypothyroidism which further complicates the presenting complaint. The complaint involves an extensive differential diagnosis and also carries with it a high risk of complications and morbidity.    The differential diagnosis includes infection, electrolyte abnormality, anemia, ACS, arrhythmia, ICH, mass effect, CVA  Additional history obtained: Additional history obtained from EMS  Records reviewed [place]  ED Course and Reassessment: Patient is disoriented without any focal neurologic  deficits and last known well was last night so stroke alert was not called.  He will undergo head CT in addition to labs, urine and chest x-ray to evaluate for possible cause of his symptoms and he will be closely reassessed.  Independent labs interpretation:  The following labs were independently interpreted: mild ammonia elevation, otherwise within normal range  Independent visualization of imaging: - I independently visualized the following imaging with scope of interpretation limited to determining acute life threatening conditions related to emergency care: CTH, CXR, which revealed P[place] w/ possible acute  infarct, CXR without acute disease      Amount and/or Complexity of Data Reviewed Labs: ordered. Radiology: ordered.           Final Clinical Impression(s) / ED Diagnoses Final diagnoses:  None    Rx / DC Orders ED Discharge Orders     None         Kemper Durie, DO 11/27/22 1536

## 2022-11-27 NOTE — ED Notes (Signed)
Pt too lethargic to perform swallow screen currently. MD aware, PO Keppra changed to IV.

## 2022-11-27 NOTE — ED Triage Notes (Signed)
Pt BIB EMS due to AMS. LSN was 0530. Pt had a wound change at 0630 and they noticed pt altered, usuaslly axox4. Pt has hx of encephalopathy.

## 2022-11-27 NOTE — Hospital Course (Addendum)
Lives at [facility] in [place] No chills Fever  Sweats N/v/d, paiful urination  Has been voiding ok  No HA, chest pain, shortness of breath  Oreinted to place time   Did not know biden  40cents ok    830 unresponsive Skin hot Shallow breath  Ambul to  bathroom Some assist w Adls  Dry skin No wounds Keppra 1000mg   Atarax 25 prn q8 Folic acid Iron Lotion on extremities, coconut and  Vaseline Benadryl cream No seizures since at least october   11/29/2022:  Pt has no complaints currently, is eating well. Orientated to name, birthday, place, city, and time. Doesn't know why he is here. Feels like normal. Able to ambulate by himself. No visitors there. Leaves [facility] often, drinks in afternoons about 1-2 days a week. Drinks half a cup of liquor. Walks to bar.

## 2022-11-27 NOTE — ED Notes (Signed)
Dr. Mayra Neer, notified of episode of oxygen desaturation to low 70s on RA and possible sleep apnea with lethargy/drowsiness. Pt placed on 3L [place] and O2 back in upper 90s.

## 2022-11-27 NOTE — H&P (Signed)
Date: 11/28/2022               Patient Name:  Mason Hart MRN: [id number]  DOB: [birth year] Age / Sex: 67 y.o., male   PCP: [name], NP         Medical Service: Internal Medicine Teaching Service         Attending Physician: Dr. Dickie La, MD    First Contact: Willette Cluster, MD      Pager: [phone]      Second Contact: Rudene Christians, DO      Pager: Milinda Pointer [phone]           After Hours (After 5p/  First Contact Pager: [phone]  weekends / holidays): Second Contact Pager: [phone]   SUBJECTIVE   Chief Complaint: altered mental status   History of Present Illness:  Mason Hart is a 67 year old male with a past medical history of seizure disorder, alcohol use, hepatic cirrhosis, hypothyroidism, hypertension, anemia, and malnutrition who was delivered by EMS from [facility] for altered mental status.  According to [facility] staff, the patient was found unresponsive at around 8:00 on 1-1 with warm skin and shallow breathing. He awoke to sternal rub and seemed confused compared to his baseline. Normally, the patient is largely independent and can ambulate to the bathroom without any assistance. He requires help with some activities of daily living.  During initial encounter with primary team, the patient was unable to recall any events that would have led to his hospitalization. Most of his responses to questioning were short but he was fully oriented, except to president, and could perform basic financial calculations. He denied experiencing any symptoms including fever, chills, sweats, nausea, vomiting, dysuria, dyspnea, pain, headache, and bowel or bladder changes. Additionally, he denied feeling confused or lethargic prior to his arrival.   Of note, the patient confirms that he takes levetiracetam on a daily basis. Patient has been seizure-free since at least 08-2022.     ED Course: Upon arrival to the ED, vitals notable for HR 109. Laboratory testing  demonstrated Alb 3.3, Hgb 7.4, nRBC 0.5, and Amm 45. Urinalysis positive for hemoglobin, ketones, leukocytes, and bacteria. Head CT revealed possible acute left PCA territory infarction that is possibly artifactual. Abdominal radiograph showed moderate stool burden within rectum in nonobstructive pattern. Low lung volumes with right base atelectasis identified on chest radiograph. Brain MRI revealed periventricular and subcortical hyperintensities bilaterally that likely represent chronic microvascular changes. Ceftriaxone and 1L NS bolus were administered in the ED.    Meds:  Current Meds  Medication Sig   acetaminophen (TYLENOL) 325 MG tablet Take 2 tablets (650 mg total) by mouth every 6 (six) hours as needed for mild pain (or Fever >/= 101).   diphenhydrAMINE-zinc acetate (BENADRYL) cream Apply topically 3 (three) times daily as needed for itching. (Patient taking differently: Apply 1 Application topically every 6 (six) hours as needed for itching.)   EMOLLIENT EX Apply 1 Application topically 2 (two) times daily. Apply to arms and legs topically every morning and at bedtime for dry skin.   ferrous sulfate 325 (65 FE) MG tablet Take 1 tablet (325 mg total) by mouth daily.   folic acid (FOLVITE) 1 MG tablet Take 1 tablet (1 mg total) by mouth daily.   hydrOXYzine (ATARAX) 25 MG tablet Take 25 mg by mouth every 8 (eight) hours as needed for itching.   ketoconazole (NIZORAL) 2 % shampoo Apply 1 Application topically 2 (two) times a week. Every Monday  and Thursday   levETIRAcetam (KEPPRA) 1000 MG tablet Take 1 tablet (1,000 mg total) by mouth 2 (two) times daily. Last fill! Must keep appointment.   Multiple Vitamin (MULTIVITAMIN WITH MINERALS) TABS tablet Take 1 tablet by mouth daily.   pantoprazole (PROTONIX) 20 MG tablet Take 1 tablet (20 mg total) by mouth daily.   thiamine 100 MG tablet Take 1 tablet (100 mg total) by mouth daily.    Past Medical History  Past Surgical History:  Procedure  Laterality Date   NO PAST SURGERIES       Social:  Lives With: [facility] Occupation: none Support: family Level of Function: requires assistance with some ADLs PCP: Geryl Rankins NP Substances: drinks alcohol     Allergies: Allergies as of 11/27/2022   (No Known Allergies)      Review of Systems: A complete ROS was negative except as per HPI.    OBJECTIVE:   Physical Exam: Blood pressure (!) 104/58, pulse 85, temperature 98.9 F (37.2 C), temperature source Oral, resp. rate 15, SpO2 98 %.   General:      awake and responsive, lying comfortably in bed, cooperative, not in acute distress Skin:       warm and dry, scaly in bilateral lower extremities  Lungs:      normal respiratory effort, breathing unlabored, symmetrical chest rise, no crackles or wheezing Cardiac:      regular rate and rhythm, normal S1 and S2, capillary refill <1 seconds, no pitting edema Abdomen:      soft and non-distended, normoactive bowel sounds, no tenderness to palpation or guarding Musculoskeletal:  motor strength 5 /5 with grip Neurologic:      oriented to person-place-time, completed basic financial calculations, stated that Marijean Bravo was the president, moving all extremities, no gross focal deficits, mild action tremor in LUE Psychiatric:      Irritable affect, intelligible speech   Labs: CBC    Component Value Date/Time   WBC 7.9 11/27/2022 0908   RBC 5.15 11/27/2022 0908   HGB 13.9 11/27/2022 2341   HGB 11.6 (L) 04/13/2020 1540   HCT 41.0 11/27/2022 2341   HCT 35.7 (L) 04/13/2020 1540   PLT 228 11/27/2022 0908   PLT 302 04/13/2020 1540   MCV 90.7 11/27/2022 0908   MCV 89 04/13/2020 1540   MCH 29.3 11/27/2022 0908   MCHC 32.3 11/27/2022 0908   RDW 12.4 11/27/2022 0908   RDW 13.9 04/13/2020 1540   LYMPHSABS 1.9 01/09/2021 0750   MONOABS 0.5 01/09/2021 0750   EOSABS 1.2 (H) 01/09/2021 0750   BASOSABS 0.0 01/09/2021 0750     CMP     Component Value Date/Time   NA  138 11/27/2022 2341   NA 133 (L) 04/13/2020 1540   K 3.9 11/27/2022 2341   CL 100 11/27/2022 0908   CO2 23 11/27/2022 0908   GLUCOSE 96 11/27/2022 0908   BUN 9 11/27/2022 0908   BUN 25 04/13/2020 1540   CREATININE 0.93 11/27/2022 0908   CALCIUM 8.9 11/27/2022 0908   PROT 7.6 11/27/2022 0908   PROT 7.9 04/13/2020 1540   ALBUMIN 3.3 (L) 11/27/2022 0908   ALBUMIN 3.9 04/13/2020 1540   AST 18 11/27/2022 0908   ALT 11 11/27/2022 0908   ALKPHOS 98 11/27/2022 0908   BILITOT 1.1 11/27/2022 0908   BILITOT 0.6 04/13/2020 1540   GFRNONAA >60 11/27/2022 0908   GFRAA 82 04/13/2020 1540     Imaging:  Mason BRAIN WO CONTRAST Result Date: 11/27/2022  IMPRESSION: 1. No acute intracranial abnormality or significant interval change. 2. Periventricular and subcortical T2 hyperintensities bilaterally are stable to slightly increased from the prior exam. The finding is nonspecific but can be seen in the setting of chronic microvascular ischemia, a demyelinating process such as multiple sclerosis, vasculitis, complicated migraine headaches, or as the sequelae of a prior infectious or inflammatory process. 3. Mild sinus disease.   DG Pelvis Portable Result Date: 11/27/2022 IMPRESSION: No unexpected radiopaque foreign bodies. Telemetry leads overlie the pelvis.   DG Chest 1 View Result Date: 11/27/2022 IMPRESSION: Low lung volumes with right base atelectasis.   DG Abd 1 View Result Date: 11/27/2022 IMPRESSION: 1. Nonobstructive bowel gas pattern. 2. Moderate stool burden within the rectum.   CT Head Wo Contrast Addendum Date: 11/27/2022   ADDENDUM REPORT: 11/27/2022 11:19 ADDENDUM: Study discussed by telephone with Dr. Leanord Asal on 11/27/2022 at 1057 hours. She advises the patient has altered mental status with no lateralize Ng weakness, but visual fields have not yet been evaluated. We discussed that if necessary noncontrast Brain MRI should be conclusive. Result Date: 11/27/2022 IMPRESSION: 1. Possible  acute Left PCA territory infarct with cytotoxic edema in the left occipital lobe. However, this may be artifactual. 2. No acute intracranial hemorrhage or mass effect. No other acute intracranial abnormality.     ECG: My personal interpretation is right bundle branch block, which is unchanged from prior ECG on 01-03-2021    ASSESSMENT & PLAN:   Assessment & Plan by Problem: Principal Problem:   Acute metabolic encephalopathy   Mason Santerre is a 67 year old male with a past medical history of seizure disorder, alcohol use, hepatic cirrhosis, hypothyroidism, hypertension, anemia, and malnutrition who was delivered by EMS from SNF with altered mental status, now admitted for management of urinary tract infection.   ---Acute metabolic encephalopathy Patient was delivered to [facility] ED via EMS from SNF for altered mental status and shallow breathing. Upon arrival, head CT and brain MRI were both negative for acute intracranial process, instead demonstrating chronic microvascular changes. Radiographic imaging negative for bowel obstruction and lung pathology. Urinalysis demonstrated evidence of possible urinary tract infection, cultures are currently pending. Patient has been breathing well on room air and his lung exam was unremarkable. He was fully oriented during initial encounter with the treatment team, though appeared slightly below his cognitive baseline. Etiology of altered mental status remains unclear at this time, perhaps a mild urinary tract infection given leukocytes and rare bacteria on urinalysis. Ceftriaxone was administered in the ED, procalcitonin level pending. > Neurology consult, appreciate recommendations > Intravenous LR at 148mL/hr > Consider resuming ceftriaxone if procalcitonin elevated > Check TSH > Check procalcitonin > Trend CBC q24 > Trend BMP q24 > Follow urine cultures, no growth day 0 > Follow blood cultures, no growth day 0 > Delirium precautions   ---Seizure  disorder Patient has a history of seizures dating back to at least 2013 per electronic medical record managed with levetiracetam. Although hospitalized in 11-2018 for breakthrough status epilepticus due to non-compliance, he reports taking this medication regularly at [facility]. Last known seizure was over two months ago. > Levetiracetam 1000mg  q12 > Seizure precautions   ---Malnutrition ---History of anemia Patient is chronically malnourished, albumin currently 3.3. He takes several nutritional supplements at home including ferrous sulfate and folic acid. Additionally, he receives Ensure formula three times daily at his SNF. > Folic acid 1mg  q24 > Check iron, ferritin, and TIBC > Consider Ensure when mental status improves   ---  History of perihepatic ascites and slightly nodular liver ---History of alcohol abuse Abdominal ultrasound performed in 02-2019 identified perihepatic ascites and a slightly nodular liver, findings concerning for possible cirrhosis. However, subsequent abdominal CT obtained in 12-2020 failed to identify evidence of cirrhosis and laboratory tests upon arrival including AST-ALT were unremarkable. > Monitor clinically     Diet: NPO VTE: Enoxaparin IVF: LR,100cc/hr Code: Full  Prior to Admission Living Arrangement: SNF, [facility] Anticipated Discharge Location: SNF Barriers to Discharge: mental status improvement  Dispo: Admit patient to Inpatient with expected length of stay greater than 2 midnights.  Signed: Crissie Sickles, MD Internal Medicine Resident PGY-1  11/28/2022, 12:26 AM

## 2022-11-27 NOTE — ED Provider Notes (Signed)
3:17 PM Assumed care of patient from off-going team. For more details, please see note from same day.  In brief, this is a 67 y.o. male presents from nursing home from [place]. Normal last night. Chronic wounds on LEs dressing change this AM and was confused/lethargic. HDS. Drowsy, arousable to verbal. Oriented to person/place but not time. MAEs. Ammonia 45, otherwise labs unremarkable.   Plan/Dispo at time of sign-out & ED Course since sign-out: [ ]  MRI If negative, likely admit for AMS  BP 104/61   Pulse (!) 101   Temp 99.1 F (37.3 C) (Oral)   Resp 20   SpO2 95%    ED Course:   Clinical Course as of 12/12/22 2157  Mon Nov 27, 2022  1059 [facility] with ?L PCA territory infarct. Patient is not complaining of any visual field deficits but MRI Will be performed to further evaluate for possible stroke as cause of AMS. [VK]  6063 Patient signed out to Dr. Mayra Neer pending MRI with plan for likely admission. [VK]  1901 MR BRAIN WO CONTRAST 1. No acute intracranial abnormality or significant interval change. 2. Periventricular and subcortical T2 hyperintensities bilaterally are stable to slightly increased from the prior exam. The finding is nonspecific but can be seen in the setting of chronic microvascular ischemia, a demyelinating process such as multiple sclerosis, vasculitis, complicated migraine headaches, or as the sequelae of a prior infectious or inflammatory process. 3. Mild sinus disease.   [HN]  1903 Consulted to neurology [HN]  1913 Bacteria, UA(!): RARE [HN]  1913 WBC, UA: 11-20 [HN]  1913 Leukocytes,Ua(!): LARGE [HN]  1915 +UTI. Urine culture ordered and will treat w/ IV ceftriaxone. [HN]  1933 D/w neurology who looked at imaging and stated that this most likely represents chronic microvascular disease.  Received a call from nursing who stated that patient was drowsy on exam and desatted into the 70s, placed on 3 L nasal cannula and back up in the 90s.  Will consult medicine for altered  mental status. [HN]    Clinical Course User Index [HN] Audley Hose, MD [VK] Kemper Durie, DO    Dispo: Admit to medicine for AMS w/ +UTI and new O2 requirement ------------------------------- Cindee Lame, MD Emergency Medicine  This note was created using dictation software, which may contain spelling or grammatical errors.   Audley Hose, MD 12/12/22 2157

## 2022-11-27 NOTE — ED Notes (Addendum)
Writer spoke with son [name] and gave update on patient's condition and plan of care. Updated phone number is [phone]

## 2022-11-28 ENCOUNTER — Inpatient Hospital Stay (HOSPITAL_COMMUNITY): Payer: Medicare PPO

## 2022-11-28 DIAGNOSIS — G9340 Encephalopathy, unspecified: Secondary | ICD-10-CM | POA: Diagnosis not present

## 2022-11-28 DIAGNOSIS — G40909 Epilepsy, unspecified, not intractable, without status epilepticus: Secondary | ICD-10-CM

## 2022-11-28 DIAGNOSIS — E46 Unspecified protein-calorie malnutrition: Secondary | ICD-10-CM | POA: Diagnosis not present

## 2022-11-28 DIAGNOSIS — R4182 Altered mental status, unspecified: Secondary | ICD-10-CM

## 2022-11-28 DIAGNOSIS — G9341 Metabolic encephalopathy: Secondary | ICD-10-CM | POA: Diagnosis not present

## 2022-11-28 LAB — CBC
HCT: 46.4 % (ref 39.0–52.0)
Hemoglobin: 14.8 g/dL (ref 13.0–17.0)
MCH: 29.2 pg (ref 26.0–34.0)
MCHC: 31.9 g/dL (ref 30.0–36.0)
MCV: 91.5 fL (ref 80.0–100.0)
Platelets: 161 10*3/uL (ref 150–400)
RBC: 5.07 MIL/uL (ref 4.22–5.81)
RDW: 12.5 % (ref 11.5–15.5)
WBC: 7.1 10*3/uL (ref 4.0–10.5)
nRBC: 0 % (ref 0.0–0.2)

## 2022-11-28 LAB — IRON AND TIBC
Iron: 27 ug/dL — ABNORMAL LOW (ref 45–182)
Saturation Ratios: 12 % — ABNORMAL LOW (ref 17.9–39.5)
TIBC: 221 ug/dL — ABNORMAL LOW (ref 250–450)
UIBC: 194 ug/dL

## 2022-11-28 LAB — PROTIME-INR
INR: 1.2 (ref 0.8–1.2)
Prothrombin Time: 14.7 seconds (ref 11.4–15.2)

## 2022-11-28 LAB — URINE CULTURE: Culture: NO GROWTH

## 2022-11-28 LAB — PROCALCITONIN: Procalcitonin: <0.1 ng/mL

## 2022-11-28 LAB — BASIC METABOLIC PANEL
Anion gap: 9 (ref 5–15)
BUN: 10 mg/dL (ref 8–23)
CO2: 24 mmol/L (ref 22–32)
Calcium: 8.4 mg/dL — ABNORMAL LOW (ref 8.9–10.3)
Chloride: 103 mmol/L (ref 98–111)
Creatinine, Ser: 0.79 mg/dL (ref 0.61–1.24)
GFR, Estimated: >60 mL/min (ref >60)
Glucose, Bld: 92 mg/dL (ref 70–99)
Potassium: 3.9 mmol/L (ref 3.5–5.1)
Sodium: 136 mmol/L (ref 135–145)

## 2022-11-28 LAB — HIV ANTIBODY (ROUTINE TESTING W REFLEX): HIV Screen 4th Generation wRfx: NONREACTIVE

## 2022-11-28 LAB — FERRITIN: Ferritin: 216 ng/mL (ref 24–336)

## 2022-11-28 NOTE — ED Notes (Signed)
EEG at bedside.

## 2022-11-28 NOTE — Progress Notes (Addendum)
Subjective: Patient evaluated in bed in the ED hallway. He is minimally interactive. He is oriented only to person and year. He does not know why he is in the hospital and is confused on where he came from prior to the hospital and just says a health facility. He denies chest pain, sob, N/V, abdominal pain, dysuria, urinary frequency or urgency. He says he is moving his bowels fine. Answers "I dont know to many questions". Says he developed a cough this morning.  Objective:  Vital signs in last 24 hours: Vitals:   11/28/22 0300 11/28/22 0606 11/28/22 0759 11/28/22 1130  BP:  98/70 114/69 108/71  Pulse: 85 87 87 86  Resp: 13 14 13 12   Temp:  98.4 F (36.9 C) 98.5 F (36.9 C) 98.7 F (37.1 C)  TempSrc:  Oral Oral Oral  SpO2: 98% 95% 98% 100%   GMW:NUUV nourished, well developed, lethargic, no acute distress HEENT: eye lid crusting, sclera anicteric, no conjunctival erythema CV:RRR, normal s1/s2, no m/r/g, 2+bilateral radial and pedal pulses Pulm: normal wob on RA, LCTAB Abd: soft, NT,ND Ext: warm, dry, no LE edema, legs with bilateral compression wrappings Skin: No sacral or extremity wounds noted Neuro: Alert, oriented to person and year,bilateral intention tremor of the hands, no asterixis  Assessment/Plan:  Principal Problem:   Acute metabolic encephalopathy Active Problems:   Acute encephalopathy Mr Mason Hart is a 67 year old male with a past medical history of seizure disorder, alcohol use, hepatic cirrhosis, hypothyroidism, hypertension, anemia, and malnutrition who was delivered by EMS from SNF with altered mental status and now being worked up for breakthrough seizure vs other causes of acute metabolic encephalopathy.  Acute metabolic encephalopathy Patient was delivered to [facility] ED via EMS from SNF for altered mental status and shallow breathing that began suddenly at [facility] 11/27/2021 where the patient was also noted to have drooling and facial droop. CT and brain MRI were both  negative for acute intracranial process, instead demonstrating chronic microvascular changes. Radiographic imaging negative for bowel obstruction and lung pathology. Patient has been breathing well on room air and his lung exam was unremarkable.  Urinalysis demonstrated evidence of possible urinary tract infection although he patient denies symptoms. Will follow urine culture but do not think this is UTI. Has been afebrile and no wbc elevation to suggest other underlying infection. No transaminase or hepatic synthetic function abnormalities to suggest hepatic encephalopathy. No evidence of electrolyte abnormalities. Does have a history of hypothyroidism so will follow up TSH as a cause of AMS. He does have a history of seizures which per chart review appears to primarily be in the setting of alcohol withdrawal. He gets his seizure medications  from [facility] so is likely adherent to his Keppra and is not likely using alcohol. Given the acute mental status change and following confusion this could represent breakthrough seizures. Will get neurology involved for potential eeg and medication titration. Will also follow up on B12,folate, thiamine given history of malnutrition and new He was fully oriented during initial encounter with the treatment team , though was only oriented to person and year when reevaluated. He seems to not want to engage as opposed to having decreased alertness. Etiology of altered mental status remains unclear at this time.  -Neurology consult, appreciate recommendations -F/u TSH, B12,folate, B1 -Trend CBC q24 -Trend BMP q24 -Follow urine cultures, no growth day 0 -Follow blood cultures, no growth day 0 -Delirium precautions   Seizure disorder Patient has a history of seizures dating  back to at least 2013 per electronic medical record managed with levetiracetam. Although hospitalized in 11-2018 for breakthrough status epilepticus due to non-compliance, he reports taking this  medication regularly at [facility]. Last known seizure was over two months ago. -Levetiracetam 1000mg  q12 -Seizure precautions -Neurology consulted   Malnutrition Patient is chronically malnourished, albumin currently 3.3. He takes several nutritional supplements at home including ferrous sulfate and folic acid. Additionally, he receives Ensure formula three times daily at his SNF. -Folic acid 1mg  q24 -Ensure once mental status improves   Prior to Admission Living Arrangement: Anticipated Discharge Location: Barriers to Discharge: Dispo: Anticipated discharge in approximately 2 day(s).   Iona Coach, MD 11/28/2022, 12:02 PM Pager: Iona Coach, MD Internal Medicine Resident, PGY-1 [facility]  Pager: [phone] After 5pm on weekdays and 1pm on weekends: On Call pager [phone]

## 2022-11-28 NOTE — Procedures (Signed)
Patient Name: Mason Hart  MRN: [id number]  Epilepsy Attending: Lora Havens  Referring Physician/Provider: Katy Apo, NP  Date: 11/28/2022 Duration: 22.47 mins  Patient history: 66yo m with ams. EEG to evaluate for seizure  Level of alertness: Awake, asleep  AEDs during EEG study: LEV  Technical aspects: This EEG study was done with scalp electrodes positioned according to the 10-20 International system of electrode placement. Electrical activity was reviewed with band pass filter of 1-70Hz , sensitivity of 7 uV/mm, display speed of 80mm/sec with a 60Hz  notched filter applied as appropriate. EEG data were recorded continuously and digitally stored.  Video monitoring was available and reviewed as appropriate.  Description: The posterior dominant rhythm consists of 8 Hz activity of moderate voltage (25-35 uV) seen predominantly in posterior head regions, symmetric and reactive to eye opening and eye closing. Sleep was characterized by vertex waves, sleep spindles (12 to 14 Hz), maximal frontocentral region.  Hyperventilation and photic stimulation were not performed.     IMPRESSION: This study is within normal limits. No seizures or epileptiform discharges were seen throughout the recording.  Mason Hart

## 2022-11-28 NOTE — Consult Note (Signed)
Neurology Consultation Reason for Consult: Found with altered mental status at nursing facility Requesting Physician: Dr. Sol Blazing  CC: None  History is obtained from: Patient and chart  HPI: Mason Hart is a 67 y.o. male history of alcohol abuse, seizures on Keppra, hypertension and hypothyroidism who presented to the emergency department when he was found with altered mental status and decreased responsiveness at his skilled nursing facility.  Per facility, patient is normally alert and oriented x 4 but was found yesterday at 8:00 responsive only to sternal rub with confusion.  Patient does not have any memory of this, states that he recalls being at his nursing facility and then was here at the hospital.  He states he did not feel the way he did after seizures in the past.  He currently complains of being tired but otherwise feels like he is at his baseline.  Participation in interview and exam is limited.   To attending MD he denies any known history of seizures although on review of records he has a longstanding history of seizures on Keppra, often in the setting of alcohol withdrawal but also medication nonadherence.  He is not familiar with the medication Keppra, he is not sure exactly where he lives but notes it is some sort of home and they give him medications there  Per review of the chart as early as 2012 he was having seizures at that time with postictal right-sided weakness  ROS: All other review of systems was negative except as noted in the HPI.   Past Medical History:  Diagnosis Date   Alcohol abuse    Hypertension    Hypothyroidism    Noncompliance with medication regimen    RBBB (right bundle branch block)    Seizures (HCC)    Past Surgical History:  Procedure Laterality Date   NO PAST SURGERIES       Family History  Problem Relation Age of Onset   Heart disease Mother    Other Father        unsure of medical history   Cancer Neg Hx    Diabetes Neg Hx    Stroke  Neg Hx      Social History:  reports that he has never smoked. He has never used smokeless tobacco. He reports current alcohol use. He reports that he does not use drugs.   Exam: Current vital signs: BP 114/69 (BP Location: Left Arm)   Pulse 87   Temp 98.5 F (36.9 C) (Oral)   Resp 13   SpO2 98%  Vital signs in last 24 hours: Temp:  [98.4 F (36.9 C)-99.1 F (37.3 C)] 98.5 F (36.9 C) (01/02 0759) Pulse Rate:  [80-101] 87 (01/02 0759) Resp:  [9-27] 13 (01/02 0759) BP: (94-120)/(58-73) 114/69 (01/02 0759) SpO2:  [73 %-100 %] 98 % (01/02 0759)   Physical Exam  Constitutional: Appears mildly chronically ill Psych: Affect appropriate to situation, affect flat and limited participation Eyes: No scleral injection HENT: No oropharyngeal obstruction.  MSK: Notable enlargement of the left knee.  Muscle wasting in the hands bilaterally Cardiovascular: Normal rate and regular rhythm.  Perfusing extremities well Respiratory: Effort normal, non-labored breathing Skin: Severe chronic skin changes of the bilateral lower extremities left greater than right  Neuro: Mental Status: Patient is awake, alert, oriented to person, place, month, year, but very hazy on details of situation.  On attending evaluation he incorrectly reports the year is 73 Patient is able to give some history, but participation is limited No  signs of aphasia or neglect 3 out of 3 registration, 1 out of 3 recall, able to name 4 animals with 4 feet, unable to add change correctly Cranial Nerves: II: Pupils equal round and reactive to light, limited participation in exam with visual fields but may be deficit in right lower quadrant III,IV, VI: EOMI without ptosis or diploplia.  V: Facial sensation is not symmetric, but patient unable to describe further VII: Facial movement is symmetric.  VIII: hearing is intact to voice X: Phonation normal XI: Shoulder shrug is symmetric. XII: tongue is midline without atrophy or  fasciculations.  Motor: Tone is normal. Bulk is normal.  Normal antigravity strength was present in all four extremities.  5/5 on confrontational strength testing with MD Sensory: Sensation is intact throughout with touch feeling colder on the right Cerebellar: FNF are intact bilaterally Gait:  Deferred in acute setting   I have reviewed labs in epic and the results pertinent to this consultation are:    Latest Ref Rng & Units 11/28/2022   12:41 AM 11/27/2022   11:41 PM 11/27/2022    9:08 AM  CBC  WBC 4.0 - 10.5 K/uL 7.1   7.9   Hemoglobin 13.0 - 17.0 g/dL 14.8  13.9  15.1   Hematocrit 39.0 - 52.0 % 46.4  41.0  46.7   Platelets 150 - 400 K/uL 161   228        Latest Ref Rng & Units 11/28/2022   12:41 AM 11/27/2022   11:41 PM 11/27/2022    9:08 AM  BMP  Glucose 70 - 99 mg/dL 92   96   BUN 8 - 23 mg/dL 10   9   Creatinine 0.61 - 1.24 mg/dL 0.79   0.93   Sodium 135 - 145 mmol/L 136  138  135   Potassium 3.5 - 5.1 mmol/L 3.9  3.9  3.8   Chloride 98 - 111 mmol/L 103   100   CO2 22 - 32 mmol/L 24   23   Calcium 8.9 - 10.3 mg/dL 8.4   8.9    Urinalysis    Component Value Date/Time   COLORURINE YELLOW 11/27/2022 1800   APPEARANCEUR HAZY (A) 11/27/2022 1800   LABSPEC 1.013 11/27/2022 1800   PHURINE 5.0 11/27/2022 1800   GLUCOSEU NEGATIVE 11/27/2022 1800   HGBUR MODERATE (A) 11/27/2022 1800   BILIRUBINUR NEGATIVE 11/27/2022 1800   KETONESUR 5 (A) 11/27/2022 1800   PROTEINUR NEGATIVE 11/27/2022 1800   UROBILINOGEN 1.0 04/04/2012 1449   NITRITE NEGATIVE 11/27/2022 1800   LEUKOCYTESUR LARGE (A) 11/27/2022 1800    B12 pending B1 pending  TSH pending RPR pending HIV pending  I have reviewed the images obtained:  CT head: Possible acute left PCA territory infarct  MRI brain: No acute intracranial abnormality, periventricular and subcortical T2 hyperintensities stable to slightly increased from prior exam  Impression: 67 year old patient presents from nursing facility with episode  of decreased responsiveness and altered mental status.  Patient is currently alert and oriented x 4, but participation in interview and exam is limited.  Memory slightly impaired, but unclear exactly what his baseline is.  It is possible that he had a seizure and was postictal when he was found in the nursing facility, so it is reasonable to obtain resting EEG given some reports of variable lethargy etc..  However, per chart he has not had seizures since 2020.  Will obtain nutritional labs to rule out deficiency causing altered mental status.  This may  also be a dementia related fluctuation or related to UTI which is asymptomatic per patient although he is also not a little reliable historian given he does not remember he has ever had a seizure in the past  Recommendations: -Routine EEG, resulted negative -Obtain B12, B1, TSH, RPR and HIV as possible reversible causes of altered mental status, treat as needed -Delirium precautions -Treatment of urinary tract infection per primary team -Continue home Keppra 1000 mg twice daily -Consider outpatient neurocognitive evaluation -Consider treatment of UTI, noting the patient does not appear to be a reliable historian and may not be able to accurately report symptoms -No further inpatient neurological workup recommended at this time, neurology will be available as needed going forward   Patient seen by NP and then by MD, MD to update note as needed  Attending Neurologist's note:  I personally saw this patient, gathering history, performing a full neurologic examination, reviewing relevant labs, personally reviewing relevant imaging including CT and MRI brain, and formulated the assessment and plan, adding the note above for completeness and clarity to accurately reflect my thoughts   Lesleigh Noe MD-PhD [facility] [phone] Available 7 AM to 7 PM, outside these hours please contact Neurologist on call listed on AMION

## 2022-11-28 NOTE — Progress Notes (Signed)
EEG complete - results pending 

## 2022-11-29 ENCOUNTER — Encounter (HOSPITAL_COMMUNITY): Payer: Self-pay | Admitting: Internal Medicine

## 2022-11-29 DIAGNOSIS — R4182 Altered mental status, unspecified: Secondary | ICD-10-CM | POA: Diagnosis not present

## 2022-11-29 DIAGNOSIS — G9341 Metabolic encephalopathy: Secondary | ICD-10-CM | POA: Diagnosis not present

## 2022-11-29 LAB — CBC
HCT: 39.8 % (ref 39.0–52.0)
Hemoglobin: 12.6 g/dL — ABNORMAL LOW (ref 13.0–17.0)
MCH: 28.8 pg (ref 26.0–34.0)
MCHC: 31.7 g/dL (ref 30.0–36.0)
MCV: 91.1 fL (ref 80.0–100.0)
Platelets: 207 10*3/uL (ref 150–400)
RBC: 4.37 MIL/uL (ref 4.22–5.81)
RDW: 12.1 % (ref 11.5–15.5)
WBC: 6.2 10*3/uL (ref 4.0–10.5)
nRBC: 0 % (ref 0.0–0.2)

## 2022-11-29 LAB — TSH: TSH: 2.235 u[IU]/mL (ref 0.350–4.500)

## 2022-11-29 LAB — RPR: RPR Ser Ql: NONREACTIVE

## 2022-11-29 LAB — VITAMIN B12: Vitamin B-12: 549 pg/mL (ref 180–914)

## 2022-11-29 LAB — MRSA NEXT GEN BY PCR, NASAL: MRSA by PCR Next Gen: NOT DETECTED

## 2022-11-29 LAB — CK
Total CK: 197 U/L (ref 49–397)
Total CK: 264 U/L (ref 49–397)

## 2022-11-29 LAB — FOLATE: Folate: 35.3 ng/mL (ref >5.9)

## 2022-11-29 LAB — HIV ANTIBODY (ROUTINE TESTING W REFLEX): HIV Screen 4th Generation wRfx: NONREACTIVE

## 2022-11-29 MED ORDER — LEVETIRACETAM 500 MG PO TABS
1000.0000 mg | ORAL_TABLET | Freq: Two times a day (BID) | ORAL | Status: DC
  Administered 2022-11-29: 1000 mg via ORAL
  Filled 2022-11-29: qty 2

## 2022-11-29 NOTE — Discharge Instructions (Signed)
You were hospitalized for confusion. You had head imaging that did not show any reason for your confusion. You had blood work done that did not show any evidence of infection. You had multiple vitamin levels checked that did not show any abnormalities. Your liver, kidneys, blood electrolytes were all normal. The neurology team evaluated you and you did not have evidence of an active seizure. This may have all been from a seizure. This could also be from underlying dementia as you have said you wander sometimes. You should have a neurocognitive evaluation to see if dementia is the cause of your confusion. You should continue to take your home seizure medications. You should not drink alcohol as this can increase your risk of seizure.

## 2022-11-29 NOTE — Progress Notes (Signed)
Report called to nurse at [facility], all questions answered. Iv's removed and AVS printed and sent with PTAR for receiving facility.   Gwendolyn Grant, RN

## 2022-11-29 NOTE — Evaluation (Signed)
Occupational Therapy Evaluation Patient Details Name: Mason Hart MRN: [id number] DOB: 11/29/1955 Today's Date: 11/29/2022   History of Present Illness Pt is a 67 y.o. male presenting with AMS. Pt from [facility]. MRI and EEG with no acute findings. PMH significant for encephalopathy, alcohol abuse on Keppra, HTN, hypothyroidism.   Clinical Impression   Per chart, PTA, pt was from [facility]. Upon eval, pt oriented to person, place, and time, but pt providing inconsistent information from chart in regard to assist needed at baseline. Upon eval, pt required increased time and multimodal cueing for technique during functional mobility. Pt performing STS transfers with min A and multimodal cues for technique/safety. Pt reporting he does not use RW, but observed with reliance on BUE support and difficulty achieving upright positioning. Pt requires max A for LB ADL at this time due to inability to reach feet and poor trunk control during attempts. Recommending SNF level services for discharge.      Recommendations for follow up therapy are one component of a multi-disciplinary discharge planning process, led by the attending physician.  Recommendations may be updated based on patient status, additional functional criteria and insurance authorization.   Follow Up Recommendations  Skilled nursing-short term rehab (<3 hours/day)     Assistance Recommended at Discharge Intermittent Supervision/Assistance  Patient can return home with the following A little help with walking and/or transfers;A lot of help with bathing/dressing/bathroom;Assistance with cooking/housework;Direct supervision/assist for medications management;Direct supervision/assist for financial management;Assist for transportation;Help with stairs or ramp for entrance    Functional Status Assessment  Patient has had a recent decline in their functional status and demonstrates the ability to make significant improvements in  function in a reasonable and predictable amount of time.  Equipment Recommendations  Other (comment) (RW)    Recommendations for Other Services       Precautions / Restrictions Precautions Precautions: Fall Restrictions Weight Bearing Restrictions: No      Mobility Bed Mobility Overal bed mobility: Needs Assistance Bed Mobility: Supine to Sit, Sit to Supine     Supine to sit: Min assist Sit to supine: Min assist   General bed mobility comments: Min A for trunk elevation to EOB and significantly increased time. Min A to guide BLE back to bed.    Transfers Overall transfer level: Needs assistance Equipment used: Rolling walker (2 wheels) Transfers: Sit to/from Stand Sit to Stand: Min assist           General transfer comment: Min A for initial riseand multimodal cues for technique and posture.      Balance Overall balance assessment: Needs assistance Sitting-balance support: No upper extremity supported, Feet supported Sitting balance-Leahy Scale: Fair     Standing balance support: Bilateral upper extremity supported, During functional activity Standing balance-Leahy Scale: Poor Standing balance comment: Reliant on BUE support or external support                           ADL either performed or assessed with clinical judgement   ADL Overall ADL's : Needs assistance/impaired Eating/Feeding: Set up;Bed level   Grooming: Set up;Sitting   Upper Body Bathing: Set up;Sitting   Lower Body Bathing: Moderate assistance;Sit to/from stand   Upper Body Dressing : Sitting;Minimal assistance   Lower Body Dressing: Sit to/from stand;Maximal assistance   Toilet Transfer: Minimal assistance;Ambulation;Rolling walker (2 wheels) Toilet Transfer Details (indicate cue type and reason): Min A for rise and steady. Heavy reliance on UE, requries cues  for safety with transfer and OT to stabilize RW as pt pulling back on RW         Functional mobility during  ADLs: Min guard;Rolling walker (2 wheels) General ADL Comments: Pt reports he does not use RW at baseline, but heavy reliance on BUE observede in session, and not actieving upright posture even with max multimodal cues.     Vision Patient Visual Report: No change from baseline Vision Assessment?: No apparent visual deficits Additional Comments: Pt reports his vision is "fine". Using remote during session to control TV     Perception     Praxis      Pertinent Vitals/Pain Pain Assessment Pain Assessment: Faces Faces Pain Scale: Hurts a little bit Pain Location: L knee, but pt denied pain (observed to guard) Pain Descriptors / Indicators: Guarding Pain Intervention(s): Limited activity within patient's tolerance, Monitored during session     Hand Dominance Right   Extremity/Trunk Assessment Upper Extremity Assessment Upper Extremity Assessment: Generalized weakness;RUE deficits/detail (BUE 4-/5 grossly and 3+/5 composite flex/ext) RUE Deficits / Details: RUE with decreased coordination and pt observed with intrinsic wasting. Believe this is likely baseline.   Lower Extremity Assessment Lower Extremity Assessment: Defer to PT evaluation   Cervical / Trunk Assessment Cervical / Trunk Assessment: Kyphotic;Other exceptions Cervical / Trunk Exceptions: forward rounding of shoulders, large body habitus   Communication Communication Communication: Other (comment);No difficulties (Intermittently difficult to understand.)   Cognition Arousal/Alertness: Awake/alert Behavior During Therapy: WFL for tasks assessed/performed, Flat affect Overall Cognitive Status: No family/caregiver present to determine baseline cognitive functioning                                 General Comments: Pt with PMH of encephalopathy. Pt A&O to person, place, and time, but with inconsistences with chart and pt provided history. Following all one step commands with increased time.     General  Comments  Pt reports he does not have RW at home. L knee edamatous    Exercises     Shoulder Instructions      Home Living Family/patient expects to be discharged to:: Skilled nursing facility                                 Additional Comments: Per chart, family expects to be discharged to SNF. Per chart, pt is from [facility]. Per pt, he was fully independent prior to arrival, and "did not have a home" but did agree that he was recently at [facility]. Pt with reluctance to state he needed assist with anything at baseline, however, was oriented.      Prior Functioning/Environment Prior Level of Function : Needs assist             Mobility Comments: Pt reports no AD ADLs Comments: Pt reports independent, but per chart received assistance with ADL and IADL.        OT Problem List: Decreased strength;Impaired balance (sitting and/or standing);Decreased activity tolerance;Decreased cognition;Decreased safety awareness;Decreased coordination;Decreased knowledge of use of DME or AE      OT Treatment/Interventions: Self-care/ADL training;Therapeutic exercise;DME and/or AE instruction;Therapeutic activities;Balance training;Patient/family education;Cognitive remediation/compensation    OT Goals(Current goals can be found in the care plan section) Acute Rehab OT Goals Patient Stated Goal: none stated OT Goal Formulation: With patient Time For Goal Achievement: 12/13/22 Potential to Achieve Goals: Good  OT  Frequency: Min 2X/week    Co-evaluation              AM-PAC OT "6 Clicks" Daily Activity     Outcome Measure Help from another person eating meals?: None Help from another person taking care of personal grooming?: A Little Help from another person toileting, which includes using toliet, bedpan, or urinal?: A Lot Help from another person bathing (including washing, rinsing, drying)?: A Lot Help from another person to put on and taking off regular upper  body clothing?: A Little Help from another person to put on and taking off regular lower body clothing?: A Lot 6 Click Score: 16   End of Session Equipment Utilized During Treatment: Gait belt;Rolling walker (2 wheels) Nurse Communication: Mobility status  Activity Tolerance: Patient tolerated treatment well Patient left: in bed;with bed alarm set;with call bell/phone within reach  OT Visit Diagnosis: Unsteadiness on feet (R26.81);Muscle weakness (generalized) (M62.81);Other abnormalities of gait and mobility (R26.89);Other symptoms and signs involving cognitive function                Time: 3716-9678 OT Time Calculation (min): 27 min Charges:  OT General Charges $OT Visit: 1 Visit OT Evaluation $OT Eval Moderate Complexity: 1 Mod OT Treatments $Self Care/Home Management : 8-22 mins  Tyler Deis, OTR/L [facility] Acute Rehabilitation Office: [phone]   Myrla Halsted 11/29/2022, 9:13 AM

## 2022-11-29 NOTE — Evaluation (Signed)
Physical Therapy Evaluation Patient Details Name: Mason Hart MRN: [id number] DOB: [birth year] Today's Date: 11/29/2022  History of Present Illness  Pt is a 67 y.o. male admitted with encephalopathy on 11/27/22. Pt from [facility]. MRI and EEG with no acute findings. PMH significant for encephalopathy, alcohol abuse on Keppra, HTN, hypothyroidism.  Clinical Impression  Pt admitted with above diagnosis. Pt overly forthcoming with information regarding living environment or prior level of function and becomes irritated with questions.  Per chart pt at [facility] (and MD reports there for at least a year).  Today, pt requiring mod A for bed mobility and to stand then min guard to ambulate 20' with RW.  Pt reports lived alone prior.  Do recommend return to SNF at d/c.  Pt currently with functional limitations due to the deficits listed below (see PT Problem List). Pt will benefit from skilled PT to increase their independence and safety with mobility to allow discharge to the venue listed below.          Recommendations for follow up therapy are one component of a multi-disciplinary discharge planning process, led by the attending physician.  Recommendations may be updated based on patient status, additional functional criteria and insurance authorization.  Follow Up Recommendations Skilled nursing-short term rehab (<3 hours/day) Can patient physically be transported by private vehicle: Yes    Assistance Recommended at Discharge Intermittent Supervision/Assistance  Patient can return home with the following  A lot of help with walking and/or transfers;A lot of help with bathing/dressing/bathroom;Assistance with cooking/housework;Help with stairs or ramp for entrance    Equipment Recommendations Rolling walker (2 wheels)  Recommendations for Other Services       Functional Status Assessment Patient has had a recent decline in their functional status and demonstrates the ability to make  significant improvements in function in a reasonable and predictable amount of time.     Precautions / Restrictions Precautions Precautions: Fall Restrictions Weight Bearing Restrictions: No      Mobility  Bed Mobility Overal bed mobility: Needs Assistance Bed Mobility: Supine to Sit, Sit to Supine     Supine to sit: Mod assist Sit to supine: Min assist   General bed mobility comments: Mod A for trunk elevation and scooting forward with increased time.  Min A for legs back in bed    Transfers Overall transfer level: Needs assistance Equipment used: Rolling walker (2 wheels) Transfers: Sit to/from Stand Sit to Stand: From elevated surface, Mod assist           General transfer comment: Increased time and mod A to rise    Ambulation/Gait Ambulation/Gait assistance: Min guard Gait Distance (Feet): 75 Feet Assistive device: Rolling walker (2 wheels) Gait Pattern/deviations: Step-to pattern, Trunk flexed Gait velocity: decreased     General Gait Details: Min cues for posture  Stairs            Wheelchair Mobility    Modified Rankin (Stroke Patients Only)       Balance Overall balance assessment: Needs assistance Sitting-balance support: No upper extremity supported, Feet supported Sitting balance-Leahy Scale: Fair     Standing balance support: Bilateral upper extremity supported, During functional activity Standing balance-Leahy Scale: Poor Standing balance comment: Reliant on BUE support or external support                             Pertinent Vitals/Pain Pain Assessment Pain Assessment: No/denies pain    Home  Living Family/patient expects to be discharged to:: Skilled nursing facility                   Additional Comments: Per chart pt from [facility]  He states he lives alone and "did not have a home." Pt is not forthcoming with information.    Prior Function Prior Level of Function : Needs assist              Mobility Comments: Pt not forthcoming with information. Does state he ambulated without AD.  Overheard pt tell MD that he leaves [place] at times by walking -question accuracy of this. ADLs Comments: Pt reports independent, but per chart received assistance with ADL and IADL.     Hand Dominance   Dominant Hand: Right    Extremity/Trunk Assessment   Upper Extremity Assessment Upper Extremity Assessment: Defer to OT evaluation (did note decreased shoulder ROM) RUE Deficits / Details: RUE with decreased coordination and pt observed with intrinsic wasting. Believe this is likely baseline.    Lower Extremity Assessment Lower Extremity Assessment: LLE deficits/detail;RLE deficits/detail RLE Deficits / Details: ROM: Genu varus deformity; lacking ~10 degrees knee ext; MMT: 4/5 throughout LLE Deficits / Details: ROM: Genu varus deformity; lacking ~10 degrees knee ext; MMT: 4/5 throughout    Cervical / Trunk Assessment Cervical / Trunk Assessment: Kyphotic Cervical / Trunk Exceptions: forward rounding of shoulders, large body habitus  Communication   Communication: No difficulties  Cognition Arousal/Alertness: Awake/alert Behavior During Therapy: Flat affect Overall Cognitive Status: No family/caregiver present to determine baseline cognitive functioning                                 General Comments: Pt with PMH of encephalopathy. Pt A&O to person, place, and time, but with inconsistences with chart and pt provided history. Following all one step commands with increased time.        General Comments General comments (skin integrity, edema, etc.): VSS    Exercises     Assessment/Plan    PT Assessment Patient needs continued PT services  PT Problem List Decreased strength;Decreased range of motion;Decreased cognition;Decreased activity tolerance;Decreased knowledge of use of DME;Decreased balance;Decreased mobility;Decreased knowledge of  precautions;Decreased safety awareness       PT Treatment Interventions DME instruction;Therapeutic exercise;Gait training;Balance training;Functional mobility training;Therapeutic activities;Patient/family education    PT Goals (Current goals can be found in the Care Plan section)  Acute Rehab PT Goals Patient Stated Goal: return home PT Goal Formulation: With patient Time For Goal Achievement: 12/13/22 Potential to Achieve Goals: Fair    Frequency Min 2X/week     Co-evaluation               AM-PAC PT "6 Clicks" Mobility  Outcome Measure Help needed turning from your back to your side while in a flat bed without using bedrails?: A Little Help needed moving from lying on your back to sitting on the side of a flat bed without using bedrails?: A Lot Help needed moving to and from a bed to a chair (including a wheelchair)?: A Little Help needed standing up from a chair using your arms (e.g., wheelchair or bedside chair)?: A Lot Help needed to walk in hospital room?: A Little Help needed climbing 3-5 steps with a railing? : A Little 6 Click Score: 16    End of Session Equipment Utilized During Treatment: Gait belt Activity Tolerance: Patient tolerated treatment  well Patient left: in bed;with call bell/phone within reach;with bed alarm set Nurse Communication: Mobility status PT Visit Diagnosis: Other abnormalities of gait and mobility (R26.89);Muscle weakness (generalized) (M62.81)    Time: 8115-7262 PT Time Calculation (min) (ACUTE ONLY): 23 min   Charges:   PT Evaluation $PT Eval Low Complexity: 1 Low PT Treatments $Gait Training: 8-22 mins        Abran Richard, PT Acute Rehab [facility] [phone]   Karlton Lemon 11/29/2022, 10:57 AM

## 2022-11-29 NOTE — NC FL2 (Signed)
[place] LEVEL OF CARE FORM     IDENTIFICATION  Patient Name: Mason Hart Birthdate: Jan 23, 1956 Sex: male Admission Date (Current Location): 11/27/2022  [facility] [place] Number:      Facility and Address:  [facility] [place], [place] [address], [place],  [address]      Provider Number: [id number]  Attending Physician Name and Address:  Campbell Riches, MD  Relative Name and Phone Number:  Briant Cedar (Son)  [phone] [facility])    Current Level of Care: Hospital Recommended Level of Care: [place] Prior Approval Number:    Date Approved/Denied:   PASRR Number: [id number]  Discharge Plan: SNF    Current Diagnoses: Patient Active Problem List   Diagnosis Date Noted   Acute encephalopathy 84/69/6295   Acute metabolic encephalopathy 28/41/3244   Protein-calorie malnutrition, severe 01/05/2021   Hypothyroidism 01/04/2021   Hypoxia 01/04/2021   Volume overload 11/29/7251   Diastolic dysfunction 66/44/0347   Community acquired pneumonia of right lung    Hypernatremia    Thrombocytopenia (HCC)    Weakness    SOB (shortness of breath)    Altered mental status 01/02/2021   Lactic acidosis 42/59/5638   Metabolic acidosis 75/64/3329   Hepatic cirrhosis ([place]) 05/26/2020   Seizure disorder ([place]) 04/29/2020   Gait abnormality 04/29/2020   Acute respiratory failure ([place]) 04/13/2020   Alcohol intoxication with delirium ([place]) 06/04/2019   Pressure injury of skin 03/19/2019   Breakthrough seizure ([place]) 03/18/2019   Edema of left lower extremity 03/18/2019   Symptomatic anemia 03/18/2019   Anemia, normocytic normochromic 12/05/2018   Endotracheally intubated 12/05/2018   Hypothermia 12/05/2018   Hyponatremia 12/05/2018   Postictal state ([place]) 12/05/2018   Sinusitis 12/05/2018   Abnormal chest x-ray 12/05/2018   Hypotension 12/05/2018   Bradycardia 12/05/2018   ARF (acute renal failure) ([place]) 11/14/2018    Clavicle fracture 11/14/2018   Hypokalemia 06/24/2017   Hypoglycemia 06/24/2017   Alcohol use 06/24/2017   Seizure ([place]) 06/24/2017   Noncompliance with medication regimen    Seizures ([place]     Orientation RESPIRATION BLADDER Height & Weight     Self, Time, Place  Normal Incontinent, External catheter Weight: 244 lb 4.3 oz (110.8 kg) Height:  6\' 1"  (185.4 cm)  BEHAVIORAL SYMPTOMS/MOOD NEUROLOGICAL BOWEL NUTRITION STATUS      Incontinent Diet (see d/c summary)  AMBULATORY STATUS COMMUNICATION OF NEEDS Skin   Extensive Assist Verbally Normal                       Personal Care Assistance Level of Assistance  Bathing, Feeding, Dressing Bathing Assistance: Limited assistance Feeding assistance: Independent Dressing Assistance: Limited assistance     Functional Limitations Info  Sight, Hearing, Speech Sight Info: Adequate Hearing Info: Adequate Speech Info: Adequate    SPECIAL CARE FACTORS FREQUENCY  PT (By licensed PT), OT (By licensed OT)     PT Frequency: 5x/week OT Frequency: 5x/week            Contractures Contractures Info: Not present    Additional Factors Info  Code Status, Allergies Code Status Info: Full code Allergies Info: no known allergies           Current Medications (11/29/2022):  This is the current hospital active medication list Current Facility-Administered Medications  Medication Dose Route Frequency Provider Last Rate Last Admin   acetaminophen (TYLENOL) tablet 650 mg  650 mg Oral Q6H PRN Iona Beard, MD  Or   acetaminophen (TYLENOL) suppository 650 mg  650 mg Rectal Q6H PRN Iona Beard, MD       enoxaparin (LOVENOX) injection 40 mg  40 mg Subcutaneous Daily Iona Beard, MD   40 mg at 76/72/09 4709   folic acid (FOLVITE) tablet 1 mg  1 mg Oral Daily Iona Beard, MD   1 mg at 11/29/22 6283   levETIRAcetam (KEPPRA) tablet 1,000 mg  1,000 mg Oral BID Masters, Katie, DO   1,000 mg at 11/29/22 6629   polyethylene glycol  (MIRALAX / GLYCOLAX) packet 17 g  17 g Oral Daily PRN Iona Beard, MD       senna-docusate (Senokot-S) tablet 1 tablet  1 tablet Oral QHS PRN Iona Beard, MD         Discharge Medications: Please see discharge summary for a list of discharge medications.  Relevant Imaging Results:  Relevant Lab Results:   Additional Information SSN: [id number] [place], [place]

## 2022-11-29 NOTE — Discharge Summary (Signed)
Name: Mason Hart MRN: [id number] DOB: May 23, 1956 67 y.o. PCP: [name], NP  Date of Admission: 11/27/2022  8:38 AM Date of Discharge: No discharge date for patient encounter. Attending Physician: Ginnie Smart, MD  Discharge Diagnosis: 1. Principal Problem:   Acute metabolic encephalopathy Active Problems:   Acute encephalopathy   Discharge Medications: Allergies as of 11/29/2022   No Known Allergies      Medication List     TAKE these medications    acetaminophen 325 MG tablet Commonly known as: TYLENOL Take 2 tablets (650 mg total) by mouth every 6 (six) hours as needed for mild pain (or Fever >/= 101).   diphenhydrAMINE-zinc acetate cream Commonly known as: BENADRYL Apply topically 3 (three) times daily as needed for itching. What changed:  how much to take when to take this   EMOLLIENT EX Apply 1 Application topically 2 (two) times daily. Apply to arms and legs topically every morning and at bedtime for dry skin.   feeding supplement Liqd Take 237 mLs by mouth 3 (three) times daily between meals.   ferrous sulfate 325 (65 FE) MG tablet Take 1 tablet (325 mg total) by mouth daily.   folic acid 1 MG tablet Commonly known as: FOLVITE Take 1 tablet (1 mg total) by mouth daily.   hydrOXYzine 25 MG tablet Commonly known as: ATARAX Take 25 mg by mouth every 8 (eight) hours as needed for itching.   ketoconazole 2 % shampoo Commonly known as: NIZORAL Apply 1 Application topically 2 (two) times a week. Every Monday and Thursday   levETIRAcetam 1000 MG tablet Commonly known as: KEPPRA Take 1 tablet (1,000 mg total) by mouth 2 (two) times daily. Last fill! Must keep appointment.   multivitamin with minerals Tabs tablet Take 1 tablet by mouth daily.   pantoprazole 20 MG tablet Commonly known as: PROTONIX Take 1 tablet (20 mg total) by mouth daily.   thiamine 100 MG tablet Commonly known as: VITAMIN B1 Take 1 tablet (100 mg total) by mouth  daily.        Disposition and follow-up:   Mason Hart was discharged from [facility] in Stable condition.  At the hospital follow up visit please address:  1.  Acute metabolic encephalopathy vs delirium -would benefit from a formal outpatient neurocognitive evaluation -would continue delirium precautions -Will need 2 wheeled walker  2.  Labs / imaging needed at time of follow-up: NA  3.  Pending labs/ test needing follow-up: Thiamine  Follow-up Appointments: Please follow up with your [facility] within 1-2 weeks of discharge.  Hospital Course by problem list: 1.  Acute metabolic encephalopathy Patient was delivered to [facility] ED via EMS from SNF for altered mental status and shallow breathing that began suddenly at [facility] 11/27/2021 where the patient was also noted to have drooling and facial droop. CT and brain MRI were both negative for acute intracranial process, instead demonstrating chronic microvascular changes. Radiographic imaging negative for bowel obstruction and lung pathology. ABG with minimally elevated CO2 at 49.5 not contributory to AMS. Urinalysis demonstrated evidence of possible urinary tract infection although the patient denied symptoms and urine culture was negative. UA also had myoglobinuria but CK was wnl. Lactate was wnl.Has been afebrile and no wbc elevation to suggest other underlying infection. No transaminase or hepatic synthetic function abnormalities to suggest hepatic encephalopathy. No evidence of electrolyte abnormalities. TSH was normal. B12, folate both wnl. HIV and RPR negative.B1 pending at discharge. Neurology evaluated the  patient and spot EEG was negative. They continued home seizure medications and recommended outpatient neurocognitive evaluation. The etiology is still unclear but this was most likely post ictal state from breakthrough seizure vs delirium in the setting of underlying neurocognitive disorder. PT/OT evaluated and  recommended SNF with wheeled walker. At discharge patient was A&O x 4, understood why he was in the hospital, and ok with going back to [facility].  Seizure disorder Patient has a history of seizures dating back to at least 2013 per electronic medical record managed with levetiracetam. Although hospitalized in 11-2018 for breakthrough status epilepticus due to non-compliance, he reported taking this medication regularly at [facility] who administers his meds.Continued Levetiracetam 1000mg  q12 during admission and at discharge.  Subjective Pt has no complaints currently, is eating well. Orientated to name, birthday, place, city, and time. Doesn't know why he is here, but does recall when prompted about being confused. Feels like normal. Says he is able to ambulate by himself although this is refuted per PT who worked with him just prior. Says he has no visitors at [facility] place. Leaves [facility] often, drinks in afternoons about 1-2 days a week.  Drinks half a cup of liquor. Walks to bar. This is all per the patient report and does not seem consistent with the staff who report he doesn't leave bed.   Discharge Exam:   BP 111/66 (BP Location: Right Arm)   Pulse 83   Temp 98.1 F (36.7 C) (Oral)   Resp 16   Ht 6\' 1"  (1.854 m)   Wt 110.8 kg   SpO2 98%   BMI 32.23 kg/m  Discharge exam:  GLO:VFIE nourished, well developed, laying in bed in no acute distress HEENT: sclera anicteric, no conjunctival erythema CV:RRR, normal s1/s2, no m/r/g, 2+bilateral radial and pedal pulses Pulm: normal wob on RA, LCTAB Abd: soft, NT,ND Ext: warm, dry, no LE edema Neuro: A&O x 4, no focal deficits noted  Pertinent Labs, Studies, and Procedures:     Latest Ref Rng & Units 11/29/2022    5:58 AM 11/28/2022   12:41 AM 11/27/2022   11:41 PM  CBC  WBC 4.0 - 10.5 K/uL 6.2  7.1    Hemoglobin 13.0 - 17.0 g/dL 12.6  14.8  13.9   Hematocrit 39.0 - 52.0 % 39.8  46.4  41.0   Platelets 150 - 400 K/uL 207  161          Latest Ref Rng & Units 11/28/2022   12:41 AM 11/27/2022   11:41 PM 11/27/2022    9:08 AM  BMP  Glucose 70 - 99 mg/dL 92   96   BUN 8 - 23 mg/dL 10   9   Creatinine 0.61 - 1.24 mg/dL 0.79   0.93   Sodium 135 - 145 mmol/L 136  138  135   Potassium 3.5 - 5.1 mmol/L 3.9  3.9  3.8   Chloride 98 - 111 mmol/L 103   100   CO2 22 - 32 mmol/L 24   23   Calcium 8.9 - 10.3 mg/dL 8.4   8.9    EEG adult  Result Date: 11/28/2022 Lora Havens, MD     11/28/2022  2:25 PM Patient Name: Kahlin Mark MRN: [id number] Epilepsy Attending: Lora Havens Referring Physician/Provider: Katy Apo, NP Date: 11/28/2022 Duration: 22.47 mins Patient history: 67yo m with ams. EEG to evaluate for seizure Level of alertness: Awake, asleep AEDs during EEG study: LEV Technical  aspects: This EEG study was done with scalp electrodes positioned according to the 10-20 International system of electrode placement. Electrical activity was reviewed with band pass filter of 1-70Hz , sensitivity of 7 uV/mm, display speed of 26mm/sec with a 60Hz  notched filter applied as appropriate. EEG data were recorded continuously and digitally stored.  Video monitoring was available and reviewed as appropriate. Description: The posterior dominant rhythm consists of 8 Hz activity of moderate voltage (25-35 uV) seen predominantly in posterior head regions, symmetric and reactive to eye opening and eye closing. Sleep was characterized by vertex waves, sleep spindles (12 to 14 Hz), maximal frontocentral region.  Hyperventilation and photic stimulation were not performed.   IMPRESSION: This study is within normal limits. No seizures or epileptiform discharges were seen throughout the recording.   MR BRAIN WO CONTRAST  Result Date: 11/27/2022 CLINICAL DATA:  Altered mental status. EXAM: MRI HEAD WITHOUT CONTRAST TECHNIQUE: Multiplanar, multiecho pulse sequences of the brain and surrounding structures were obtained without  intravenous contrast. COMPARISON:  CT head without contrast 11/27/2022. MR head without contrast 01/07/2023. FINDINGS: Brain: No acute infarct, hemorrhage, or mass lesion is present. Periventricular and subcortical T2 hyperintensities are stable to slightly increased from the prior exam. Today's study demonstrates the T2 changes more clearly without the patient motion on the prior exam. Dilated perivascular spaces are present within the basal ganglia. The ventricles are of normal size. No significant extraaxial fluid collection is present. The internal auditory canals are within normal limits. The brainstem and cerebellum are within normal limits. Vascular: Flow is present in the major intracranial arteries. Skull and upper cervical spine: The craniocervical junction is normal. Upper cervical spine is within normal limits. Marrow signal is unremarkable. Sinuses/Orbits: Mild mucosal thickening present within the paranasal sinuses bilaterally. Minimal fluid is present in the left sphenoid sinus. Mastoid air cells are clear. The globes and orbits are within normal limits. IMPRESSION: 1. No acute intracranial abnormality or significant interval change. 2. Periventricular and subcortical T2 hyperintensities bilaterally are stable to slightly increased from the prior exam. The finding is nonspecific but can be seen in the setting of chronic microvascular ischemia, a demyelinating process such as multiple sclerosis, vasculitis, complicated migraine headaches, or as the sequelae of a prior infectious or inflammatory process. 3. Mild sinus disease. Electronically Signed   By: 03/08/2023 M.D.   On: 11/27/2022 15:35   DG Pelvis Portable  Result Date: 11/27/2022 CLINICAL DATA:  MRI clearance. EXAM: PORTABLE PELVIS 1-2 VIEWS COMPARISON:  None Available. FINDINGS: No unexpected radiopaque foreign bodies are noted. Telemetry leads overlie the pelvis. No definite acute bony abnormalities noted. IMPRESSION: No  unexpected radiopaque foreign bodies. Telemetry leads overlie the pelvis. Electronically Signed   By: 01/26/2023 M.D.   On: 11/27/2022 12:52   DG Chest 1 View  Result Date: 11/27/2022 CLINICAL DATA:  Altered mental status. EXAM: CHEST  1 VIEW COMPARISON:  01/05/2021 FINDINGS: Stable cardiomediastinal contours. Low lung volumes with asymmetric elevation of the right hemidiaphragm. Atelectasis noted in the right base. No pleural effusion or airspace disease. Chronic deformity involving the left clavicle. IMPRESSION: Low lung volumes with right base atelectasis. Electronically Signed   By: 03/05/2021 M.D.   On: 11/27/2022 12:52   DG Abd 1 View  Result Date: 11/27/2022 CLINICAL DATA:  Altered mental status. EXAM: ABDOMEN - 1 VIEW COMPARISON:  None Available. FINDINGS: No dilated small bowel loops identified. Moderate stool burden identified with retained desiccated stool within the rectum. Degenerative changes  noted within both hips and lumbar spine. IMPRESSION: 1. Nonobstructive bowel gas pattern. 2. Moderate stool burden within the rectum. Electronically Signed   By: Kerby Moors M.D.   On: 11/27/2022 12:51   CT Head Wo Contrast  Addendum Date: 11/27/2022   ADDENDUM REPORT: 11/27/2022 11:19 ADDENDUM: Study discussed by telephone with Dr. Leanord Asal on 11/27/2022 at 1057 hours. She advises the patient has altered mental status with no lateralize Ng weakness, but visual fields have not yet been evaluated. We discussed that if necessary noncontrast Brain MRI should be conclusive. Electronically Signed   By: Genevie Ann M.D.   On: 11/27/2022 11:19   Result Date: 11/27/2022 CLINICAL DATA:  67 year old male with altered mental status. EXAM: CT HEAD WITHOUT CONTRAST TECHNIQUE: Contiguous axial images were obtained from the base of the skull through the vertex without intravenous contrast. RADIATION DOSE REDUCTION: This exam was performed according to the departmental dose-optimization program which includes  automated exposure control, adjustment of the mA and/or kV according to patient size and/or use of iterative reconstruction technique. COMPARISON:  Head CT 01/03/2021.  Brain MRI 01/07/2021. FINDINGS: Brain: Stable cerebral volume. No midline shift, ventriculomegaly, mass effect, evidence of mass lesion, intracranial hemorrhage. Mild to moderate for age patchy cerebral white matter hypodensity. But appearance suspicious for cytotoxic edema in the left occipital lobe on series 3, image 9. Seemingly some mass effect on the left occipital horn there also. However, difficult to exclude artifact as the left cerebellum on the same coronal images also appears somewhat asymmetric. Otherwise no acute cortically based infarct. Vascular: No suspicious intracranial vascular hyperdensity. Skull: Chronic right orbital floor ORIF. No acute osseous abnormality identified. Sinuses/Orbits: Chronic paranasal sinus disease with mucoperiosteal thickening. Improved left sphenoid aeration since 2022. Tympanic cavities and mastoids remain well aerated. Other: No acute orbit or scalp soft tissue finding identified. IMPRESSION: 1. Possible acute Left PCA territory infarct with cytotoxic edema in the left occipital lobe. However, this may be artifactual. 2. No acute intracranial hemorrhage or mass effect. No other acute intracranial abnormality. Electronically Signed: By: Genevie Ann M.D. On: 11/27/2022 10:45     Discharge Instructions: Discharge Instructions     Diet - low sodium heart healthy   Complete by: As directed    Increase activity slowly   Complete by: As directed       You were hospitalized for confusion. You had head imaging that did not show any reason for your confusion. You had blood work done that did not show any evidence of infection. You had multiple vitamin levels checked that did not show any abnormalities. Your liver, kidneys, blood electrolytes were all normal. The neurology team evaluated you and you did not  have evidence of an active seizure. This may have all been from a seizure. This could also be from underlying dementia as you have said you wander sometimes. You should have a neurocognitive evaluation to see if dementia is the cause of your confusion. You should continue to take your home seizure medications. You should not drink alcohol as this can increase your risk of seizure. Signed: Iona Coach, MD 11/29/2022, 1:15 PM   Pager: Iona Coach, MD Internal Medicine Resident, PGY-1 [facility]  Pager: [phone]

## 2022-11-29 NOTE — TOC Transition Note (Signed)
Transition of Care [facility]) - CM/SW Discharge Note   Patient Details  Name: Mason Hart MRN: [id number] Date of Birth: 02/21/1956  [facility] CM/SW Contact:  Bethann Berkshire, [place] Phone Number: 11/29/2022, 12:47 PM   Clinical Narrative:     CSW is informed pt medically ready for DC back to [facility] today. CSW contacted [facility] Intake and confirmed pt is LTC there and can return.   PT/OT recommending STR at SNF. Discussed with PT via secure chat and confirmed pt could benefit from rehab on return to SNF. CSW started insurance auth  request for STR in [facility] portal; status:pending. We do not need to wait for medicare approval for rehab as pt is under LTC at [facility] with his medicaid benefits.   Patient will DC to: [facility] Anticipated DC date: 11/29/22 Family notified: Son/POA [facility] Transport by: Corey Harold   Per MD patient ready for DC to [facility]. RN, patient, patient's family, and facility notified of DC. Discharge Summary and FL2 sent to facility. RN to call report prior to discharge [phone] ). DC packet on chart. Ambulance transport requested for patient.   CSW will sign off for now as social work intervention is no longer needed. Please consult [place] again if new needs arise.   Final next level of care: [place] Barriers to Discharge: No Barriers Identified   Patient Goals and CMS Choice      Discharge Placement                Patient chooses bed at:  [facility]) Patient to be transferred to facility by: [place] Name of family member notified: Son/POA [facility] Patient and family notified of of transfer: 11/29/22  Discharge Plan and Services Additional resources added to the After Visit Summary for                                       Social Determinants of Health (SDOH) Interventions SDOH Screenings   Food Insecurity: No Food Insecurity (11/29/2022)  Housing: Low Risk  (11/29/2022)   Transportation Needs: No Transportation Needs (11/29/2022)  Utilities: Not At Risk (11/29/2022)  Depression (PHQ2-9): Low Risk  (06/04/2019)  Tobacco Use: Low Risk  (11/29/2022)     Readmission Risk Interventions     No data to display

## 2022-12-01 LAB — VITAMIN B1: Vitamin B1 (Thiamine): 119.8 nmol/L (ref 66.5–200.0)

## 2022-12-03 LAB — CULTURE, BLOOD (ROUTINE X 2)
Culture: NO GROWTH
Culture: NO GROWTH
Special Requests: ADEQUATE
Special Requests: ADEQUATE

## 2023-01-26 ENCOUNTER — Emergency Department (HOSPITAL_COMMUNITY): Payer: Medicare PPO

## 2023-01-26 ENCOUNTER — Other Ambulatory Visit (HOSPITAL_COMMUNITY): Payer: Medicare PPO

## 2023-01-26 ENCOUNTER — Encounter (HOSPITAL_COMMUNITY): Payer: Self-pay

## 2023-01-26 ENCOUNTER — Inpatient Hospital Stay (HOSPITAL_COMMUNITY)
Admission: EM | Admit: 2023-01-26 | Discharge: 2023-01-29 | DRG: 871 | Disposition: A | Discharge status: Skilled Nursing Facility | Payer: Medicare PPO | Source: Skilled Nursing Facility | Attending: Internal Medicine | Admitting: Internal Medicine

## 2023-01-26 ENCOUNTER — Observation Stay (HOSPITAL_COMMUNITY): Payer: Medicare PPO

## 2023-01-26 ENCOUNTER — Other Ambulatory Visit: Payer: Self-pay

## 2023-01-26 DIAGNOSIS — Z79899 Other long term (current) drug therapy: Secondary | ICD-10-CM

## 2023-01-26 DIAGNOSIS — R6520 Severe sepsis without septic shock: Secondary | ICD-10-CM | POA: Diagnosis present

## 2023-01-26 DIAGNOSIS — Z5900 Homelessness unspecified: Secondary | ICD-10-CM

## 2023-01-26 DIAGNOSIS — L98429 Non-pressure chronic ulcer of back with unspecified severity: Secondary | ICD-10-CM | POA: Diagnosis present

## 2023-01-26 DIAGNOSIS — Z635 Disruption of family by separation and divorce: Secondary | ICD-10-CM

## 2023-01-26 DIAGNOSIS — K7460 Unspecified cirrhosis of liver: Secondary | ICD-10-CM | POA: Diagnosis present

## 2023-01-26 DIAGNOSIS — Z7401 Bed confinement status: Secondary | ICD-10-CM

## 2023-01-26 DIAGNOSIS — J101 Influenza due to other identified influenza virus with other respiratory manifestations: Secondary | ICD-10-CM | POA: Insufficient documentation

## 2023-01-26 DIAGNOSIS — Z8249 Family history of ischemic heart disease and other diseases of the circulatory system: Secondary | ICD-10-CM

## 2023-01-26 DIAGNOSIS — I739 Peripheral vascular disease, unspecified: Secondary | ICD-10-CM | POA: Diagnosis present

## 2023-01-26 DIAGNOSIS — G9341 Metabolic encephalopathy: Secondary | ICD-10-CM | POA: Diagnosis present

## 2023-01-26 DIAGNOSIS — R0902 Hypoxemia: Secondary | ICD-10-CM

## 2023-01-26 DIAGNOSIS — Z7901 Long term (current) use of anticoagulants: Secondary | ICD-10-CM

## 2023-01-26 DIAGNOSIS — A4189 Other specified sepsis: Secondary | ICD-10-CM | POA: Diagnosis not present

## 2023-01-26 DIAGNOSIS — R410 Disorientation, unspecified: Secondary | ICD-10-CM

## 2023-01-26 DIAGNOSIS — I10 Essential (primary) hypertension: Secondary | ICD-10-CM | POA: Diagnosis present

## 2023-01-26 DIAGNOSIS — J690 Pneumonitis due to inhalation of food and vomit: Secondary | ICD-10-CM | POA: Diagnosis present

## 2023-01-26 DIAGNOSIS — K7682 Hepatic encephalopathy: Secondary | ICD-10-CM

## 2023-01-26 DIAGNOSIS — L03116 Cellulitis of left lower limb: Secondary | ICD-10-CM | POA: Insufficient documentation

## 2023-01-26 DIAGNOSIS — Z1152 Encounter for screening for COVID-19: Secondary | ICD-10-CM

## 2023-01-26 DIAGNOSIS — A419 Sepsis, unspecified organism: Secondary | ICD-10-CM

## 2023-01-26 DIAGNOSIS — J9601 Acute respiratory failure with hypoxia: Secondary | ICD-10-CM | POA: Diagnosis present

## 2023-01-26 DIAGNOSIS — E039 Hypothyroidism, unspecified: Secondary | ICD-10-CM | POA: Diagnosis present

## 2023-01-26 LAB — URINALYSIS, W/ REFLEX TO CULTURE (INFECTION SUSPECTED)
Bilirubin Urine: NEGATIVE
Glucose, UA: NEGATIVE mg/dL
Ketones, ur: 5 mg/dL — AB
Leukocytes,Ua: NEGATIVE
Nitrite: NEGATIVE
Protein, ur: 30 mg/dL — AB
Specific Gravity, Urine: 1.023 (ref 1.005–1.030)
pH: 5 (ref 5.0–8.0)

## 2023-01-26 LAB — CBC WITH DIFFERENTIAL/PLATELET
Abs Immature Granulocytes: 0.04 10*3/uL (ref 0.00–0.07)
Basophils Absolute: 0.1 10*3/uL (ref 0.0–0.1)
Basophils Relative: 1 %
Eosinophils Absolute: 0 10*3/uL (ref 0.0–0.5)
Eosinophils Relative: 0 %
HCT: 44.6 % (ref 39.0–52.0)
Hemoglobin: 14.5 g/dL (ref 13.0–17.0)
Immature Granulocytes: 0 %
Lymphocytes Relative: 9 %
Lymphs Abs: 1 10*3/uL (ref 0.7–4.0)
MCH: 29.1 pg (ref 26.0–34.0)
MCHC: 32.5 g/dL (ref 30.0–36.0)
MCV: 89.4 fL (ref 80.0–100.0)
Monocytes Absolute: 1 10*3/uL (ref 0.1–1.0)
Monocytes Relative: 9 %
Neutro Abs: 8.9 10*3/uL — ABNORMAL HIGH (ref 1.7–7.7)
Neutrophils Relative %: 81 %
Platelets: 168 10*3/uL (ref 150–400)
RBC: 4.99 MIL/uL (ref 4.22–5.81)
RDW: 12.4 % (ref 11.5–15.5)
WBC: 11 10*3/uL — ABNORMAL HIGH (ref 4.0–10.5)
nRBC: 0 % (ref 0.0–0.2)

## 2023-01-26 LAB — COMPREHENSIVE METABOLIC PANEL
ALT: 15 U/L (ref 0–44)
AST: 26 U/L (ref 15–41)
Albumin: 3 g/dL — ABNORMAL LOW (ref 3.5–5.0)
Alkaline Phosphatase: 75 U/L (ref 38–126)
Anion gap: 13 (ref 5–15)
BUN: 10 mg/dL (ref 8–23)
CO2: 22 mmol/L (ref 22–32)
Calcium: 8.7 mg/dL — ABNORMAL LOW (ref 8.9–10.3)
Chloride: 98 mmol/L (ref 98–111)
Creatinine, Ser: 0.98 mg/dL (ref 0.61–1.24)
GFR, Estimated: >60 mL/min (ref >60)
Glucose, Bld: 108 mg/dL — ABNORMAL HIGH (ref 70–99)
Potassium: 3.7 mmol/L (ref 3.5–5.1)
Sodium: 133 mmol/L — ABNORMAL LOW (ref 135–145)
Total Bilirubin: 1.6 mg/dL — ABNORMAL HIGH (ref 0.3–1.2)
Total Protein: 7.4 g/dL (ref 6.5–8.1)

## 2023-01-26 LAB — I-STAT VENOUS BLOOD GAS, ED
Acid-Base Excess: 2 mmol/L (ref 0.0–2.0)
Bicarbonate: 23.6 mmol/L (ref 20.0–28.0)
Calcium, Ion: 0.99 mmol/L — ABNORMAL LOW (ref 1.15–1.40)
HCT: 45 % (ref 39.0–52.0)
Hemoglobin: 15.3 g/dL (ref 13.0–17.0)
O2 Saturation: 99 %
Potassium: 3.8 mmol/L (ref 3.5–5.1)
Sodium: 133 mmol/L — ABNORMAL LOW (ref 135–145)
TCO2: 24 mmol/L (ref 22–32)
pCO2, Ven: 28.8 mmHg — ABNORMAL LOW (ref 44–60)
pH, Ven: 7.521 — ABNORMAL HIGH (ref 7.25–7.43)
pO2, Ven: 115 mmHg — ABNORMAL HIGH (ref 32–45)

## 2023-01-26 LAB — RESP PANEL BY RT-PCR (RSV, FLU A&B, COVID)  RVPGX2
Influenza A by PCR: POSITIVE — AB
Influenza B by PCR: NEGATIVE
Resp Syncytial Virus by PCR: NEGATIVE
SARS Coronavirus 2 by RT PCR: NEGATIVE

## 2023-01-26 LAB — AMMONIA: Ammonia: 94 umol/L — ABNORMAL HIGH (ref 9–35)

## 2023-01-26 LAB — BRAIN NATRIURETIC PEPTIDE: B Natriuretic Peptide: 44.1 pg/mL (ref 0.0–100.0)

## 2023-01-26 LAB — LACTIC ACID, PLASMA
Lactic Acid, Venous: 1.4 mmol/L (ref 0.5–1.9)
Lactic Acid, Venous: 2.6 mmol/L (ref 0.5–1.9)
Lactic Acid, Venous: 3.2 mmol/L (ref 0.5–1.9)

## 2023-01-26 LAB — PROTIME-INR
INR: 1.1 (ref 0.8–1.2)
Prothrombin Time: 14.3 seconds (ref 11.4–15.2)

## 2023-01-26 LAB — APTT: aPTT: 21 seconds — ABNORMAL LOW (ref 24–36)

## 2023-01-26 MED ORDER — LACTULOSE 10 GM/15ML PO SOLN: 10.0000 g | Freq: Two times a day (BID) | ORAL | Status: DC

## 2023-01-26 MED ORDER — KETOROLAC TROMETHAMINE 15 MG/ML IJ SOLN
15.0000 mg | Freq: Once | INTRAMUSCULAR | Status: AC
  Administered 2023-01-26: 15 mg via INTRAVENOUS
  Filled 2023-01-26: qty 1

## 2023-01-26 MED ORDER — VANCOMYCIN HCL IN DEXTROSE 1-5 GM/200ML-% IV SOLN
1000.0000 mg | Freq: Two times a day (BID) | INTRAVENOUS | Status: DC
  Filled 2023-01-26: qty 200

## 2023-01-26 MED ORDER — LEVETIRACETAM 500 MG PO TABS
1000.0000 mg | ORAL_TABLET | Freq: Two times a day (BID) | ORAL | Status: DC
  Administered 2023-01-26 – 2023-01-29 (×7): 1000 mg via ORAL
  Filled 2023-01-26 (×7): qty 2

## 2023-01-26 MED ORDER — CEFAZOLIN SODIUM-DEXTROSE 2-4 GM/100ML-% IV SOLN
2.0000 g | Freq: Three times a day (TID) | INTRAVENOUS | Status: DC
  Administered 2023-01-26 – 2023-01-27 (×3): 2 g via INTRAVENOUS
  Filled 2023-01-26 (×3): qty 100

## 2023-01-26 MED ORDER — ACETAMINOPHEN 650 MG RE SUPP: 650.0000 mg | Freq: Four times a day (QID) | RECTAL | Status: DC | PRN

## 2023-01-26 MED ORDER — PANTOPRAZOLE SODIUM 20 MG PO TBEC: 20.0000 mg | DELAYED_RELEASE_TABLET | Freq: Every day | ORAL | Status: DC

## 2023-01-26 MED ORDER — LACTATED RINGERS IV BOLUS
1000.0000 mL | Freq: Once | INTRAVENOUS | Status: AC
  Administered 2023-01-26: 1000 mL via INTRAVENOUS

## 2023-01-26 MED ORDER — THIAMINE HCL 100 MG PO TABS
100.0000 mg | ORAL_TABLET | Freq: Every day | ORAL | Status: DC
  Administered 2023-01-26 – 2023-01-29 (×4): 100 mg via ORAL
  Filled 2023-01-26 (×8): qty 1

## 2023-01-26 MED ORDER — ACETAMINOPHEN 325 MG PO TABS
650.0000 mg | ORAL_TABLET | Freq: Four times a day (QID) | ORAL | Status: DC | PRN
  Administered 2023-01-26 – 2023-01-27 (×2): 650 mg via ORAL
  Filled 2023-01-26 (×2): qty 2

## 2023-01-26 MED ORDER — CAMPHOR-MENTHOL 0.5-0.5 % EX LOTN: TOPICAL_LOTION | CUTANEOUS | Status: DC | PRN

## 2023-01-26 MED ORDER — ENSURE ENLIVE PO LIQD
237.0000 mL | Freq: Three times a day (TID) | ORAL | Status: DC
  Administered 2023-01-26 – 2023-01-29 (×7): 237 mL via ORAL
  Filled 2023-01-26: qty 237

## 2023-01-26 MED ORDER — OSELTAMIVIR PHOSPHATE 75 MG PO CAPS
75.0000 mg | ORAL_CAPSULE | Freq: Two times a day (BID) | ORAL | Status: DC
  Administered 2023-01-26 – 2023-01-29 (×7): 75 mg via ORAL
  Filled 2023-01-26 (×8): qty 1

## 2023-01-26 MED ORDER — LACTULOSE 10 GM/15ML PO SOLN
20.0000 g | Freq: Two times a day (BID) | ORAL | Status: DC
  Administered 2023-01-26 – 2023-01-29 (×6): 20 g via ORAL
  Filled 2023-01-26 (×6): qty 30

## 2023-01-26 MED ORDER — ADULT MULTIVITAMIN W/MINERALS CH
1.0000 | ORAL_TABLET | Freq: Every day | ORAL | Status: DC
  Administered 2023-01-26 – 2023-01-29 (×4): 1 via ORAL
  Filled 2023-01-26 (×4): qty 1

## 2023-01-26 MED ORDER — FOLIC ACID 1 MG PO TABS
1.0000 mg | ORAL_TABLET | Freq: Every day | ORAL | Status: DC
  Administered 2023-01-26 – 2023-01-29 (×4): 1 mg via ORAL
  Filled 2023-01-26 (×4): qty 1

## 2023-01-26 MED ORDER — PANTOPRAZOLE SODIUM 40 MG PO TBEC
40.0000 mg | DELAYED_RELEASE_TABLET | Freq: Every day | ORAL | Status: DC
  Administered 2023-01-27 – 2023-01-29 (×3): 40 mg via ORAL
  Filled 2023-01-26 (×4): qty 1

## 2023-01-26 MED ORDER — RIVAROXABAN 10 MG PO TABS
10.0000 mg | ORAL_TABLET | Freq: Every day | ORAL | Status: DC
  Administered 2023-01-26 – 2023-01-29 (×4): 10 mg via ORAL
  Filled 2023-01-26 (×4): qty 1

## 2023-01-26 MED ORDER — SODIUM CHLORIDE 0.9 % IV SOLN: INTRAVENOUS | Status: DC

## 2023-01-26 MED ORDER — POLYETHYLENE GLYCOL 3350 17 G PO PACK: 17.0000 g | PACK | Freq: Every day | ORAL | Status: DC | PRN

## 2023-01-26 MED ORDER — LACTATED RINGERS IV BOLUS (SEPSIS)
1000.0000 mL | Freq: Once | INTRAVENOUS | Status: AC
  Administered 2023-01-26: 1000 mL via INTRAVENOUS

## 2023-01-26 MED ORDER — VANCOMYCIN HCL IN DEXTROSE 1-5 GM/200ML-% IV SOLN
1000.0000 mg | Freq: Once | INTRAVENOUS | Status: AC
  Administered 2023-01-27: 1000 mg via INTRAVENOUS
  Filled 2023-01-26: qty 200

## 2023-01-26 MED ORDER — SODIUM CHLORIDE 0.9 % IV SOLN: INTRAVENOUS | Status: AC

## 2023-01-26 NOTE — Progress Notes (Signed)
Pharmacy Antibiotic Note  Mason Hart is a 66 y.o. male admitted on 01/26/2023 with sepsis and cellulitis.  Pharmacy has been consulted for vancomycin dosing.  Plan: Vancomycin 1000 mg IV Q12H. Goal AUC 400-550.  Expected AUC 490.  Height: '6\' 2"'$  (188 cm) Weight: 110.5 kg (243 lb 9.7 oz) IBW/kg (Calculated) : 82.2  Temp (24hrs), Avg:100.5 F (38.1 C), Min:98.8 F (37.1 C), Max:103 F (39.4 C)  Recent Labs  Lab 01/26/23 1042 01/26/23 1137 01/26/23 2029  WBC 11.0*  --   --   CREATININE 0.98  --   --   LATICACIDVEN 1.4 2.6* 3.2*    Estimated Creatinine Clearance: 95.4 mL/min (by C-G formula based on SCr of 0.98 mg/dL).    No Known Allergies   Thank you for allowing pharmacy to be a part of this patient's care.  Wynona Neat, PharmD, BCPS  01/26/2023 11:36 PM

## 2023-01-26 NOTE — ED Triage Notes (Signed)
Pt BIB [facility] from [facility] for new fever and a new productive cough. Facility reports that there was a RSV, FLU, and COVID outbreak recently in their facilty. They tested pt for COVID and it came back negative, facility did not have any flu or RSV tests available. Pt is A/O x3 which is baseline for him.   Temp 101.5 BP 130/84 HR 130 93% room air  CBG 129

## 2023-01-26 NOTE — ED Notes (Signed)
Registation at bedside

## 2023-01-26 NOTE — ED Notes (Signed)
Admission team at bedside.

## 2023-01-26 NOTE — ED Provider Notes (Signed)
[facility] Provider Note   CSN: [id number] Arrival date & time: 01/26/23  1016     History  Chief Complaint  Patient presents with   Cough    Claris Huger is a 67 y.o. male.  With a significant past medical history including but not limited to seizures on Keppra, hypertension, hypothyroidism, alcohol abuse leading to cirrhosis who presents to the ED via EMS for evaluation of fever.  EMS reports that the nursing home as the patient came from has had an outbreak of RSV and flu recently.  Was tested for COVID and was negative.  Checked his temperature this morning and found it to be 97 Fahrenheit.  Checked it later and found it to be 98.2.  Was given 650 Tylenol prior to arrival for this.  EMS also reports patient has had a nonproductive cough.  Patient currently denies all symptoms and states he feels normal.  Specifically denies chest pain, shortness of breath, feeling feverish.  He is alert and oriented to self and place but not to date or other person.  Spoke with Blanch Media, nurse at [facility] who states that patient became acutely altered this morning.  Typically fully alert and oriented.  Checked his temperature and found it to be continuously rising.  Tylenol was given at a temperature of 98.6.  Typical heart rate is between 60 and 70.  his heart rate was between 90 and 115 bpm.  Mild respiratory distress and cough began today as well.  She states that it appears similar to when he was septic in the past.  History was provided by patient, EMS, assisted living facility  HPI     Home Medications Prior to Admission medications   Medication Sig Start Date End Date Taking? Authorizing Provider  acetaminophen (TYLENOL) 325 MG tablet Take 2 tablets (650 mg total) by mouth every 6 (six) hours as needed for mild pain (or Fever >/= 101). 01/13/21   Matilde Haymaker, MD  diphenhydrAMINE-zinc acetate (BENADRYL) cream Apply topically 3 (three)  times daily as needed for itching. Patient taking differently: Apply 1 Application topically every 6 (six) hours as needed for itching. 01/13/21   Matilde Haymaker, MD  EMOLLIENT EX Apply 1 Application topically 2 (two) times daily. Apply to arms and legs topically every morning and at bedtime for dry skin.    [provider]  feeding supplement (ENSURE ENLIVE / ENSURE PLUS) LIQD Take 237 mLs by mouth 3 (three) times daily between meals. Patient not taking: Reported on 11/27/2022 01/13/21   Matilde Haymaker, MD  ferrous sulfate 325 (65 FE) MG tablet Take 1 tablet (325 mg total) by mouth daily. 10/24/20 11/27/22  Barb Merino, MD  folic acid (FOLVITE) 1 MG tablet Take 1 tablet (1 mg total) by mouth daily. 01/14/21   Matilde Haymaker, MD  hydrOXYzine (ATARAX) 25 MG tablet Take 25 mg by mouth every 8 (eight) hours as needed for itching.    [provider]  ketoconazole (NIZORAL) 2 % shampoo Apply 1 Application topically 2 (two) times a week. Every Monday and Thursday    [provider]  levETIRAcetam (KEPPRA) 1000 MG tablet Take 1 tablet (1,000 mg total) by mouth 2 (two) times daily. Last fill! Must keep appointment. 04/29/20 11/27/22  Marcial Pacas, MD  Multiple Vitamin (MULTIVITAMIN WITH MINERALS) TABS tablet Take 1 tablet by mouth daily. 01/14/21   Matilde Haymaker, MD  pantoprazole (PROTONIX) 20 MG tablet Take 1 tablet (20 mg total)  by mouth daily. 01/14/21   Matilde Haymaker, MD  thiamine 100 MG tablet Take 1 tablet (100 mg total) by mouth daily. 01/14/21   Matilde Haymaker, MD      Allergies    Patient has no known allergies.    Review of Systems   Review of Systems  Constitutional:  Positive for fever.  Respiratory:  Positive for cough.   All other systems reviewed and are negative.   Physical Exam Updated Vital Signs BP 108/67   Pulse (!) 107   Temp (!) 100.4 F (38 C) (Oral)   Resp (!) 22   Ht '6\' 2"'$  (1.88 m)   Wt 110.5 kg   SpO2 95%   BMI 31.28 kg/m  Physical Exam Vitals and nursing  note reviewed.  Constitutional:      General: He is not in acute distress.    Appearance: He is well-developed. He is obese. He is not diaphoretic.     Comments: Chronically ill-appearing  HENT:     Head: Normocephalic and atraumatic.     Mouth/Throat:     Mouth: Mucous membranes are moist.     Pharynx: Oropharynx is clear.     Comments: Poor dentition Eyes:     Conjunctiva/sclera: Conjunctivae normal.  Cardiovascular:     Rate and Rhythm: Normal rate and regular rhythm.     Heart sounds: No murmur heard. Pulmonary:     Effort: Pulmonary effort is normal. No respiratory distress.     Breath sounds: Rales (Bilateral lower lobes) present.     Comments: Occasional cough Abdominal:     Palpations: Abdomen is soft.     Tenderness: There is no abdominal tenderness. There is no guarding.  Musculoskeletal:        General: No swelling.     Cervical back: Neck supple.     Right lower leg: No edema.     Left lower leg: No edema.  Skin:    General: Skin is warm and dry.     Capillary Refill: Capillary refill takes less than 2 seconds.     Coloration: Skin is not jaundiced.     Findings: No erythema or rash.     Comments: Left lower extremity wrapped in Ace wrap initially. Appears similar to right after dressing removed.  Neurological:     Mental Status: He is alert.     Comments: Oriented to self and place.  Not oriented to date or other person  Psychiatric:        Mood and Affect: Mood normal.     ED Results / Procedures / Treatments   Labs (all labs ordered are listed, but only abnormal results are displayed) Labs Reviewed  RESP PANEL BY RT-PCR (RSV, FLU A&B, COVID)  RVPGX2 - Abnormal; Notable for the following components:      Result Value   Influenza A by PCR POSITIVE (*)    All other components within normal limits  COMPREHENSIVE METABOLIC PANEL - Abnormal; Notable for the following components:   Sodium 133 (*)    Glucose, Bld 108 (*)    Calcium 8.7 (*)    Albumin 3.0  (*)    Total Bilirubin 1.6 (*)    All other components within normal limits  CBC WITH DIFFERENTIAL/PLATELET - Abnormal; Notable for the following components:   WBC 11.0 (*)    Neutro Abs 8.9 (*)    All other components within normal limits  APTT - Abnormal; Notable for the following components:   aPTT 21 (*)  All other components within normal limits  I-STAT VENOUS BLOOD GAS, ED - Abnormal; Notable for the following components:   pH, Ven 7.521 (*)    pCO2, Ven 28.8 (*)    pO2, Ven 115 (*)    Sodium 133 (*)    Calcium, Ion 0.99 (*)    All other components within normal limits  CULTURE, BLOOD (ROUTINE X 2)  CULTURE, BLOOD (ROUTINE X 2)  LACTIC ACID, PLASMA  PROTIME-INR  BRAIN NATRIURETIC PEPTIDE  LACTIC ACID, PLASMA  URINALYSIS, W/ REFLEX TO CULTURE (INFECTION SUSPECTED)  AMMONIA  LEVETIRACETAM LEVEL    EKG EKG Interpretation  Date/Time:  Friday January 26 2023 10:25:50 EST Ventricular Rate:  127 PR Interval:    QRS Duration: 140 QT Interval:  439 QTC Calculation: 639 R Axis:   71 Text Interpretation: Sinus tachycardia old RBBB no sig change from previous Confirmed by Charlesetta Shanks [phone]) on 01/26/2023 11:08:12 AM  Radiology DG Chest Port 1 View  Result Date: 01/26/2023 CLINICAL DATA:  Questionable sepsis - evaluate for abnormality EXAM: PORTABLE CHEST 1 VIEW COMPARISON:  Radiograph 11/27/2022 FINDINGS: Unchanged elevated right hemidiaphragm. Right basilar subsegmental atelectasis. There is no new airspace disease. There is no large effusion or evidence of pneumothorax. Thoracic spondylosis. Bilateral shoulder degenerative changes. Chronic left clavicle injury. No acute osseous abnormality. IMPRESSION: Unchanged elevated right hemidiaphragm with adjacent basilar subsegmental atelectasis. No new airspace disease. Electronically Signed   By: Maurine Simmering M.D.   On: 01/26/2023 10:57    Procedures Procedures    Medications Ordered in ED Medications  lactated ringers bolus  1,000 mL (0 mLs Intravenous Stopped 01/26/23 1244)  ketorolac (TORADOL) 15 MG/ML injection 15 mg (15 mg Intravenous Given 01/26/23 1041)  lactated ringers bolus 1,000 mL (1,000 mLs Intravenous New Bag/Given 01/26/23 1245)    ED Course/ Medical Decision Making/ A&P Clinical Course as of 01/26/23 1334  Fri Jan 26, 2023  1229 Spoke with Blanch Media, [facility] he states that patient became acutely altered this morning.  Typically fully alert and oriented.  Checked his temperature and found it Rising.  Tylenol was given at a temperature of 98.6.  Typical heart rate is between 60 and 70.  If his heart rate between 90 and 115 bpm.  Mild respiratory distress and cough began today as well. [AS]  [id number] Spoke with hospitalist to admit for influenza, confusion and hypoxia [AS]    Clinical Course User Index [AS] Lancer Thurner, Grafton Folk, PA-C                             Medical Decision Making Amount and/or Complexity of Data Reviewed Labs: ordered. Radiology: ordered. ECG/medicine tests: ordered.  Risk Prescription drug management. Decision regarding hospitalization.  This patient presents to the ED for concern of altered mental status, fever, this involves an extensive number of treatment options, and is a complaint that carries with it a high risk of complications and morbidity.  The differential diagnosis includes pneumonia, influenza, COVID, RSV, sepsis, UTI  Co morbidities that complicate the patient evaluation  seizures on Keppra, hypertension, hypothyroidism, alcohol abuse leading to cirrhosis  My initial workup includes sepsis labs  Additional history obtained from: Nursing notes from this visit. Previous records within EMR system admission for similar on 11/27/2022 Nursing facility provides a portion of the history EMS provides a portion of the history  I ordered, reviewed and interpreted labs which include: Sepsis labs.  VBG  with a pH of 7.52 with a normal bicarb.   Likely some degree of respiratory alkalosis.  Respiratory panel positive for influenza A.  Slight hyponatremia of 133, hyperglycemia of 108, hypoalbuminemia of 3.0.  Borderline leukocytosis of 11.0  I ordered imaging studies including chest x-ray I independently visualized and interpreted imaging which showed no acute cardiopulmonary abnormalities I agree with the radiologist interpretation  Cardiac Monitoring:  The patient was maintained on a cardiac monitor.  I personally viewed and interpreted the cardiac monitored which showed an underlying rhythm of: Sinus tachycardia  Consultations Obtained:  I requested consultation with the hospitalist Dr. Trilby Drummer,  and discussed lab and imaging findings as well as pertinent plan - they recommend: Admission for continued monitoring of vitals and trend of his confusion  Initially febrile to 100.4 and tachycardic to 128 and hypoxic to 93% on room air.  Tachycardia improved with IV fluids.  Patient was started on 3 L nasal cannula.  67 year old male presenting to the ED for evaluation of cough, fever and confusion.  On exam he is alert and oriented to self and place but not much else.  His tired but participates in history and exam.  He denies all symptoms and states he feels at baseline.  His lab work was significant for above.  His symptoms are likely secondary to influenza.  Due to hypoxia, I believe the patient would benefit from inpatient treatment and trending of his mental status.  He will be admitted for influenza, confusion and hypoxia.  Stable at the time of admission.  Patient's case discussed with Dr. Johnney Killian who agrees with plan to admit.   Note: Portions of this report may have been transcribed using voice recognition software. Every effort was made to ensure accuracy; however, inadvertent computerized transcription errors may still be present.        Final Clinical Impression(s) / ED Diagnoses Final diagnoses:  Influenza A  Hypoxia   Confusion    Rx / DC Orders ED Discharge Orders     None         Roylene Reason, Hershal Coria 01/26/23 1423    Charlesetta Shanks, MD 01/26/23 1454

## 2023-01-26 NOTE — Hospital Course (Addendum)
[name] (SNF)  Influenza A Acute altered, more confused  T 101.55F > 100.79F Oriented to self and place (Baseline oriented to everything)  Hypoxic, on 2L [place]  Lactate normal WBC 11  Seizures, not recent -keppra  Alcoholic cirrhosis  _____________________________________________  Mason Hart his name  Thinks it is 29, does not know why he came in. Does not know what season it is.  Denies SHOB, chest pain, abdominal pain, N/V, diarrhea, constipation, headaches, urinary changes, fevers.  Denies any complaints at this time.  No friends or family that he reports. Does not want [place] to call anybody.  Denies heart or liver problems/  No medications. NKDA.   Retired Development worker, international aid.    Does have a cough, coughing up phlegm. Thick, yellowish phlegm.

## 2023-01-26 NOTE — ED Notes (Signed)
Pt rest on stretcher NAD pt reports "I feel sleepy"

## 2023-01-26 NOTE — H&P (Signed)
Date: 01/26/2023         Patient Name:  Mason Hart MRN: [id number]  DOB: Jan 12, 1956 Age / Sex: 67 y.o., male   PCP: Gildardo Pounds, NP         Medical Service: Internal Medicine Teaching Service         Attending Physician: Dr. Velna Ochs, MD    First Contact: Dr. Jodell Cipro Pager: [phone]  Second Contact: Dr. Allyson Sabal Pager: [phone]       After Hours (After 5p/  First Contact Pager: [phone]  weekends / holidays): Second Contact Pager: [phone]   Chief Concern: Altered mental status Fever  History of Present Illness: Patient is a 67 year old from nursing facility with history of cirrhosis and seizures who presents by EMS due to concern for altered mental status and fever.  Discussed HPI with SNF personnel who report that usually this patient is alert and oriented x 3, bedbound but moves with ease in bed.  Today, this patient was somnolent, less active, somewhat disoriented this morning.  He was found to have a fever.  Apparently at SNF there is an outbreak of respiratory virus.  Because of patient's mental status, detailed history is difficult to obtain.  His review of systems is pan negative.  He denies pain, fever, shortness of breath, nausea, vomiting, diarrhea, constipation, dysuria.  During our interview, the patient is somnolent, eyes remain closed, responds to questions mostly with one-word answers, oriented to person and place but not time, and unable to tell me where he is from.  In the ED he was found to have fever of 100.4 F, tachycardia, mild leukocytosis.  He tested positive for influenza A.  Medications: Tylenol Iron supplement Ketoconazole shampoo Levetiracetam 1000 mg twice daily Folic acid Thiamine Pantoprazole  Allergies: No known allergies.  Past Medical History: Cirrhosis Seizures Alcohol use disorder Hypothyroidism  Surgical History: No past surgeries demented.  Family History:  Family history reviewed in chart.  Notable for heart  disease in his mother.  Pertinent negatives include cancer, diabetes, and stroke  Social History:  From [facility].  Used to be a Development worker, international aid.  History of alcohol use disorder.  Physical Exam: Blood pressure 108/67, pulse (!) 107, temperature (!) 100.4 F (38 C), temperature source Oral, resp. rate (!) 22, height '6\' 2"'$  (1.88 m), weight 110.5 kg, SpO2 95 %.  Somnolent appearing, no apparent acute distress Mucous membranes are dry Scleral icterus not appreciated Regular tachycardia, no murmurs appreciated, no JVD appreciated, delayed cap refill bilateral lower extremities, bilateral radial and dorsalis pedis pulses 2+ Breathing is regular and unlabored on 2 L via nasal cannula, coarse crackles throughout lungs with scant expiratory wheezing, cough productive of copious gray-green sputum Abdomen is soft and nontender, tympanic to percussion, no gynecomastia Skin is cool and dry, with diffuse flaky patches, anterior left lower extremity with warmth and erythema below the knee Alert to verbal, oriented to person and place, no gross focal motor or sensory deficits, some clonus of bilateral wrists Mood is irritated, affect is concordant  EKG:  Sinus tachycardia, evidence of old right bundle branch block, no acute ischemic changes  Labs: VBG 7.521/28.8/115/24 Sodium 133 Creatinine 0.98 Albumin 3 T. bili 1.6 AST/ALT 26/15 Alkaline phosphatase 75 BNP 44.1 Lactate 1.4 > 2.6 WBC 11 Hemoglobin 14.5 Platelets 168  Images and other studies: CXR with elevated right hemidiaphragm, no focal opacities, no significant change since last study on 11/27/2022  Assessment & Plan:  Mason Hart is a 67  y.o. with history of cirrhosis and seizures who presents from SNF with altered mental status and fever, admitted for encephalopathy of multifactorial etiology, likely sepsis from flu and cellulitis, in addition to cirrhosis.  Principal Problem:   Acute metabolic encephalopathy Active Problems:    Acute hypoxic respiratory failure (HCC)   Hepatic cirrhosis (HCC)   Cellulitis of leg, left   Influenza A  Encephalopathy Normally alert and oriented x 3 and bedbound albeit active at [facility]  Findings thus far suggestive of multifactorial etiology including sepsis from flu and possible cellulitis.  Hepatic encephalopathy may also be contributing.  No focal deficits less suggestive for seizures or a CNS lesion.  Will obtain CT head to rule out mass or subdural hemorrhage.  Treat empirically for hepatic encephalopathy.  Supportive therapy for flu.  Antibiotics for cellulitis. - Follow-up head CT - Management of underlying causes per documentation below - Supportive therapy  Sepsis Treating as secondary to flu and cellulitis of left lower extremity.  Patient with fever, tachycardia, leukocytosis, encephalopathy.  Lactate elevated to 2.6. Status post 2 L fluid in the ED.  Addressing underlying causes per documentation below. - Follow-up blood cultures - Continue LR at 100 mL/h for 10 hours  Influenza A Mild acute hypoxic respiratory failure Stable on 2 L via nasal cannula.  Drops to high 80s without supplemental O2.  No focal abnormality on CXR, do not suspect bacterial pneumonia.  Does have a cough with thick secretions, but suspect this may be due to thick and dehydrated oral secretions. - Tamiflu - O2 to maintain saturation greater than 92%  Lower extremity cellulitis On exam, the leg was wrapped.  Skin underneath was warm and erythematous compared to the right leg.  No tenderness or purulence.  In setting of sepsis will treat with antibiotics.  Because of hypoxic respiratory failure and tachycardia will rule out DVT. - Cefazolin 2 g IV every 8 hours for 7 days until 02/02/2023 - Follow-up lower extremity venous ultrasound  Cirrhosis Treating encephalopathy is multifactorial, with cirrhosis as one of the drivers.  Exam is not suggestive of ascites, no dullness to percussion.  No signs of  peritonitis makes SBP less likely.  Not overtly jaundiced on exam but skin is dry and excoriated, suggestive of symptoms from mild hyperbilirubinemia.  Euvolemic on exam. Sodium 133 and T. bili 1.6.  No thrombocytopenia or coagulopathy.  Patient may be decompensated, but only mildly so.  Treat empirically for hepatic encephalopathy - Lactulose, titrated to 2-3 soft bowel movements per day - Follow-up abdominal ultrasound for ascites  History of seizures No seizure-like activity reported.  Neurologic exams not suggestive of active seizure.  Continue home [place]. - Levetiracetam 1000 mg p.o. twice daily  Diet: Heart heathy IVF: LR @ 100 mL/h thru 0100 VTE: Rivaroxaban Code: Full Surrogate: Son, Briant Cedar  Admit patient to Observation with expected length of stay less than 2 midnights.  Signed: Nani Gasser MD 01/26/2023, 2:49 PM  Pager: [phone] After 5pm on weekdays and 1pm on weekends: [phone]

## 2023-01-26 NOTE — ED Notes (Signed)
ED TO INPATIENT HANDOFF REPORT  ED Nurse Name and Phone #: Garwin Brothers [phone]  S Name/Age/Gender Mason Hart 67 y.o. male Room/Bed: 011C/011C  Code Status   Code Status: Full Code  Home/SNF/Other Nursing Home Patient oriented to: self, time, and situation Is this baseline? Yes   Triage Complete: Triage complete  Chief Complaint Acute metabolic encephalopathy [id number]  Triage Note Pt BIB [facility] from [facility] for new fever and a new productive cough. Facility reports that there was a RSV, FLU, and COVID outbreak recently in their facilty. They tested pt for COVID and it came back negative, facility did not have any flu or RSV tests available. Pt is A/O x3 which is baseline for him.   Temp 101.5 BP 130/84 HR 130 93% room air  CBG 129   Allergies No Known Allergies  Level of Care/Admitting Diagnosis ED Disposition     ED Disposition  Admit   Condition  --   Comment  Hospital Area: [facility] [[id number]]  Level of Care: Med-Surg [16]  May place patient in observation at [facility] or [facility] if equivalent level of care is available:: No  Covid Evaluation: Confirmed COVID Negative  Diagnosis: Acute metabolic encephalopathy [id number]  Admitting Physician: Velna Ochs [id number]  Attending Physician: Velna Ochs [id number]          B Medical/Surgery History Past Medical History:  Diagnosis Date   Alcohol abuse    Hypertension    Hypothyroidism    Noncompliance with medication regimen    RBBB (right bundle branch block)    Seizures ([place])    Past Surgical History:  Procedure Laterality Date   NO PAST SURGERIES       A IV Location/Drains/Wounds Patient Lines/Drains/Airways Status     Active Line/Drains/Airways     Name Placement date Placement time Site Days   Peripheral IV 01/26/23 20 G Right Wrist 01/26/23  1041  Wrist  less than 1   Pressure Injury 12/18/18 Stage I -  Intact skin with non-blanchable redness of a  localized area usually over a bony prominence. 12/18/18  0815  -- 1500            Intake/Output Last 24 hours No intake or output data in the 24 hours ending 01/26/23 1618  Labs/Imaging Results for orders placed or performed during the hospital encounter of 01/26/23 (from the past 48 hour(s))  Lactic acid, plasma     Status: None   Collection Time: 01/26/23 10:42 AM  Result Value Ref Range   Lactic Acid, Venous 1.4 0.5 - 1.9 mmol/L    Comment: Performed at [facility], [address] [place], [place] [address]  Comprehensive metabolic panel     Status: Abnormal   Collection Time: 01/26/23 10:42 AM  Result Value Ref Range   Sodium 133 (L) 135 - 145 mmol/L   Potassium 3.7 3.5 - 5.1 mmol/L    Comment: HEMOLYSIS AT THIS LEVEL MAY AFFECT RESULT   Chloride 98 98 - 111 mmol/L   CO2 22 22 - 32 mmol/L   Glucose, Bld 108 (H) 70 - 99 mg/dL    Comment: Glucose reference range applies only to samples taken after fasting for at least 8 hours.   BUN 10 8 - 23 mg/dL   Creatinine, Ser 0.98 0.61 - 1.24 mg/dL   Calcium 8.7 (L) 8.9 - 10.3 mg/dL   Total Protein 7.4 6.5 - 8.1 g/dL   Albumin 3.0 (L) 3.5 - 5.0 g/dL  AST 26 15 - 41 U/L    Comment: HEMOLYSIS AT THIS LEVEL MAY AFFECT RESULT   ALT 15 0 - 44 U/L    Comment: HEMOLYSIS AT THIS LEVEL MAY AFFECT RESULT   Alkaline Phosphatase 75 38 - 126 U/L   Total Bilirubin 1.6 (H) 0.3 - 1.2 mg/dL    Comment: HEMOLYSIS AT THIS LEVEL MAY AFFECT RESULT   GFR, Estimated >60 >60 mL/min    Comment: (NOTE) Calculated using the CKD-EPI Creatinine Equation (2021)    Anion gap 13 5 - 15    Comment: Performed at [facility] [address] [place], [place] [address]  CBC with Differential     Status: Abnormal   Collection Time: 01/26/23 10:42 AM  Result Value Ref Range   WBC 11.0 (H) 4.0 - 10.5 K/uL   RBC 4.99 4.22 - 5.81 MIL/uL   Hemoglobin 14.5 13.0 - 17.0 g/dL   HCT 44.6 39.0 - 52.0 %   MCV 89.4 80.0 - 100.0 fL   MCH 29.1 26.0 - 34.0  pg   MCHC 32.5 30.0 - 36.0 g/dL   RDW 12.4 11.5 - 15.5 %   Platelets 168 150 - 400 K/uL    Comment: REPEATED TO VERIFY   nRBC 0.0 0.0 - 0.2 %   Neutrophils Relative % 81 %   Neutro Abs 8.9 (H) 1.7 - 7.7 K/uL   Lymphocytes Relative 9 %   Lymphs Abs 1.0 0.7 - 4.0 K/uL   Monocytes Relative 9 %   Monocytes Absolute 1.0 0.1 - 1.0 K/uL   Eosinophils Relative 0 %   Eosinophils Absolute 0.0 0.0 - 0.5 K/uL   Basophils Relative 1 %   Basophils Absolute 0.1 0.0 - 0.1 K/uL   Immature Granulocytes 0 %   Abs Immature Granulocytes 0.04 0.00 - 0.07 K/uL    Comment: Performed at [facility] [address] [place], [place] [address]  Protime-INR     Status: None   Collection Time: 01/26/23 10:42 AM  Result Value Ref Range   Prothrombin Time 14.3 11.4 - 15.2 seconds   INR 1.1 0.8 - 1.2    Comment: (NOTE) INR goal varies based on device and disease states. Performed at [facility], [place] [address] [place], [place] [address]   APTT     Status: Abnormal   Collection Time: 01/26/23 10:42 AM  Result Value Ref Range   aPTT 21 (L) 24 - 36 seconds    Comment: Performed at [facility] [address] [place], [place] [address]  Brain natriuretic peptide     Status: None   Collection Time: 01/26/23 10:42 AM  Result Value Ref Range   B Natriuretic Peptide 44.1 0.0 - 100.0 pg/mL    Comment: Performed at [facility] [address] [place], [place] [address]  Resp panel by RT-PCR (RSV, Flu A&B, Covid)     Status: Abnormal   Collection Time: 01/26/23 10:45 AM   Specimen: Nasal Swab  Result Value Ref Range   SARS Coronavirus 2 by RT PCR NEGATIVE NEGATIVE   Influenza A by PCR POSITIVE (A) NEGATIVE   Influenza B by PCR NEGATIVE NEGATIVE    Comment: (NOTE) The Xpert Xpress SARS-CoV-2/FLU/RSV plus assay is intended as an aid in the diagnosis of influenza from Nasopharyngeal swab specimens and should not be used as a sole basis for treatment. Nasal washings and aspirates  are unacceptable for Xpert Xpress SARS-CoV-2/FLU/RSV testing.  Fact  Sheet for Patients: EntrepreneurPulse.com.au  Fact Sheet for Healthcare Providers: IncredibleEmployment.be  This test is not yet approved or cleared by the [place] FDA and has been authorized for detection and/or diagnosis of SARS-CoV-2 by FDA under an Emergency Use Authorization (EUA). This EUA will remain in effect (meaning this test can be used) for the duration of the COVID-19 declaration under Section 564(b)(1) of the Act, 21 U.S.C. section 360bbb-3(b)(1), unless the authorization is terminated or revoked.     Resp Syncytial Virus by PCR NEGATIVE NEGATIVE    Comment: (NOTE) Fact Sheet for Patients: EntrepreneurPulse.com.au  Fact Sheet for Healthcare Providers: IncredibleEmployment.be  This test is not yet approved or cleared by the [place] FDA and has been authorized for detection and/or diagnosis of SARS-CoV-2 by FDA under an Emergency Use Authorization (EUA). This EUA will remain in effect (meaning this test can be used) for the duration of the COVID-19 declaration under Section 564(b)(1) of the Act, 21 U.S.C. section 360bbb-3(b)(1), unless the authorization is terminated or revoked.  Performed at [facility], [place] [address] [place], [place] [address]   I-Stat venous blood gas, [facility], [facility], [facility])     Status: Abnormal   Collection Time: 01/26/23 11:22 AM  Result Value Ref Range   pH, Ven 7.521 (H) 7.25 - 7.43   pCO2, Ven 28.8 (L) 44 - 60 mmHg   pO2, Ven 115 (H) 32 - 45 mmHg   Bicarbonate 23.6 20.0 - 28.0 mmol/L   TCO2 24 22 - 32 mmol/L   O2 Saturation 99 %   Acid-Base Excess 2.0 0.0 - 2.0 mmol/L   Sodium 133 (L) 135 - 145 mmol/L   Potassium 3.8 3.5 - 5.1 mmol/L   Calcium, Ion 0.99 (L) 1.15 - 1.40 mmol/L   HCT 45.0 39.0 - 52.0 %   Hemoglobin 15.3 13.0 - 17.0 g/dL   Sample type VENOUS   Lactic acid, plasma      Status: Abnormal   Collection Time: 01/26/23 11:37 AM  Result Value Ref Range   Lactic Acid, Venous 2.6 (HH) 0.5 - 1.9 mmol/L    Comment: CRITICAL RESULT CALLED TO, READ BACK BY AND VERIFIED WITH Waynetta Sandy RN '@[id number]'$  03.01.2024 E.AHMED Performed at [facility], [place] [address] [place], [place] [address]   Ammonia     Status: Abnormal   Collection Time: 01/26/23 11:37 AM  Result Value Ref Range   Ammonia 94 (H) 9 - 35 umol/L    Comment: Performed at [facility], [place] [address] [place],  [address]  Urinalysis, w/ Reflex to Culture (Infection Suspected) -Urine, Catheterized     Status: Abnormal   Collection Time: 01/26/23 12:26 PM  Result Value Ref Range   Specimen Source URINE, CATHETERIZED    Color, Urine AMBER (A) YELLOW    Comment: BIOCHEMICALS MAY BE AFFECTED BY COLOR   APPearance HAZY (A) CLEAR   Specific Gravity, Urine 1.023 1.005 - 1.030   pH 5.0 5.0 - 8.0   Glucose, UA NEGATIVE NEGATIVE mg/dL   Hgb urine dipstick SMALL (A) NEGATIVE   Bilirubin Urine NEGATIVE NEGATIVE   Ketones, ur 5 (A) NEGATIVE mg/dL   Protein, ur 30 (A) NEGATIVE mg/dL   Nitrite NEGATIVE NEGATIVE   Leukocytes,Ua NEGATIVE NEGATIVE   RBC / HPF 11-20 0 - 5 RBC/hpf   WBC, UA 0-5 0 - 5 WBC/hpf    Comment:        Reflex urine culture not performed if WBC <=10, OR  if Squamous epithelial cells >5. If Squamous epithelial cells >5 suggest recollection.    Bacteria, UA RARE (A) NONE SEEN   Squamous Epithelial / HPF 0-5 0 - 5 /HPF   Mucus PRESENT    Hyaline Casts, UA PRESENT    Granular Casts, UA PRESENT     Comment: Performed at [facility], [place] [address], [place], [place] [address]   CT HEAD WO CONTRAST (5MM)  Result Date: 01/26/2023 CLINICAL DATA:  Mental status change EXAM: CT HEAD WITHOUT CONTRAST TECHNIQUE: Contiguous axial images were obtained from the base of the skull through the vertex without intravenous contrast. RADIATION DOSE REDUCTION: This exam was performed  according to the departmental dose-optimization program which includes automated exposure control, adjustment of the mA and/or kV according to patient size and/or use of iterative reconstruction technique. COMPARISON:  Head CT 11/27/2022.  MRI head 11/27/2022. FINDINGS: Brain: No evidence of acute infarction, hemorrhage, hydrocephalus, extra-axial collection or mass lesion/mass effect. Again seen is mild diffuse atrophy and mild periventricular white matter hypodensity, likely chronic small vessel ischemic change. Vascular: No hyperdense vessel or unexpected calcification. Skull: Normal. Negative for fracture or focal lesion. Sinuses/Orbits: There is mucosal thickening of the paranasal sinuses diffusely. There are postsurgical changes in the right orbit. Other: None. IMPRESSION: 1. No acute intracranial process. 2. Mild diffuse atrophy and mild chronic small vessel ischemic change. 3. Diffuse paranasal sinus disease. Electronically Signed   By: Ronney Asters M.D.   On: 01/26/2023 15:50   DG Chest Port 1 View  Result Date: 01/26/2023 CLINICAL DATA:  Questionable sepsis - evaluate for abnormality EXAM: PORTABLE CHEST 1 VIEW COMPARISON:  Radiograph 11/27/2022 FINDINGS: Unchanged elevated right hemidiaphragm. Right basilar subsegmental atelectasis. There is no new airspace disease. There is no large effusion or evidence of pneumothorax. Thoracic spondylosis. Bilateral shoulder degenerative changes. Chronic left clavicle injury. No acute osseous abnormality. IMPRESSION: Unchanged elevated right hemidiaphragm with adjacent basilar subsegmental atelectasis. No new airspace disease. Electronically Signed   By: Maurine Simmering M.D.   On: 01/26/2023 10:57    Pending Labs Unresulted Labs (From admission, onward)     Start     Ordered   01/27/23 [id number]  Basic metabolic panel  Tomorrow morning,   R        01/26/23 1404   01/27/23 0500  CBC with Differential/Platelet  Tomorrow morning,   R        01/26/23 1404   01/26/23  1513  TSH  Once,   R        01/26/23 1512   01/26/23 1513  T4, free  Once,   R        01/26/23 1512   01/26/23 1500  Hemoglobin A1c  Once,   R        01/26/23 1459   01/26/23 1440  MRSA Next Gen by PCR, Nasal  Once,   R        01/26/23 1439   01/26/23 1120  Levetiracetam level  Once,   URGENT        01/26/23 1119   01/26/23 1030  Blood Culture (routine x 2)  (Undifferentiated presentation (screening labs and basic nursing orders))  BLOOD CULTURE X 2,   STAT      01/26/23 1031            Vitals/Pain Today's Vitals   01/26/23 1245 01/26/23 1300 01/26/23 1315 01/26/23 1450  BP: 114/70 98/64 108/67   Pulse: (!) 111 (!) 109 (!) 107  Resp: 12 18 (!) 22   Temp:    100.2 F (37.9 C)  TempSrc:    Oral  SpO2: 92% 93% 95%   Weight:      Height:      PainSc:        Isolation Precautions Droplet precaution  Medications Medications  rivaroxaban (XARELTO) tablet 10 mg (has no administration in time range)  acetaminophen (TYLENOL) tablet 650 mg (has no administration in time range)    Or  acetaminophen (TYLENOL) suppository 650 mg (has no administration in time range)  thiamine (VITAMIN B1) tablet 100 mg (has no administration in time range)  folic acid (FOLVITE) tablet 1 mg (has no administration in time range)  multivitamin with minerals tablet 1 tablet (has no administration in time range)  feeding supplement (ENSURE ENLIVE / ENSURE PLUS) liquid 237 mL (has no administration in time range)  levETIRAcetam (KEPPRA) tablet 1,000 mg (has no administration in time range)  camphor-menthol (SARNA) lotion (has no administration in time range)  lactulose (CHRONULAC) 10 GM/15ML solution 20 g (has no administration in time range)  ceFAZolin (ANCEF) IVPB 2g/100 mL premix (has no administration in time range)  oseltamivir (TAMIFLU) capsule 75 mg (has no administration in time range)  0.9 %  sodium chloride infusion (has no administration in time range)  pantoprazole (PROTONIX) EC tablet  40 mg (has no administration in time range)  lactated ringers bolus 1,000 mL (0 mLs Intravenous Stopped 01/26/23 1244)  ketorolac (TORADOL) 15 MG/ML injection 15 mg (15 mg Intravenous Given 01/26/23 1041)  lactated ringers bolus 1,000 mL (0 mLs Intravenous Stopping previously hung infusion 01/26/23 1407)    Mobility non-ambulatory     Focused Assessments Pulmonary Assessment Handoff:  Lung sounds: Bilateral Breath Sounds: Diminished, Coarse crackles L Breath Sounds: Diminished, Coarse crackles R Breath Sounds: Diminished, Coarse crackles O2 Device: Nasal Cannula O2 Flow Rate (L/min): 2 L/min    R Recommendations: See Admitting Provider Note  Report given to:   Additional Notes:

## 2023-01-27 ENCOUNTER — Observation Stay (HOSPITAL_COMMUNITY): Payer: Medicare PPO

## 2023-01-27 DIAGNOSIS — R52 Pain, unspecified: Secondary | ICD-10-CM

## 2023-01-27 DIAGNOSIS — L0390 Cellulitis, unspecified: Secondary | ICD-10-CM

## 2023-01-27 DIAGNOSIS — J101 Influenza due to other identified influenza virus with other respiratory manifestations: Secondary | ICD-10-CM

## 2023-01-27 DIAGNOSIS — K7460 Unspecified cirrhosis of liver: Secondary | ICD-10-CM

## 2023-01-27 DIAGNOSIS — K7682 Hepatic encephalopathy: Secondary | ICD-10-CM

## 2023-01-27 DIAGNOSIS — A419 Sepsis, unspecified organism: Secondary | ICD-10-CM

## 2023-01-27 DIAGNOSIS — R209 Unspecified disturbances of skin sensation: Secondary | ICD-10-CM

## 2023-01-27 DIAGNOSIS — G9349 Other encephalopathy: Secondary | ICD-10-CM | POA: Diagnosis not present

## 2023-01-27 DIAGNOSIS — M7989 Other specified soft tissue disorders: Secondary | ICD-10-CM | POA: Diagnosis not present

## 2023-01-27 LAB — CBC WITH DIFFERENTIAL/PLATELET
Abs Immature Granulocytes: 0.05 10*3/uL (ref 0.00–0.07)
Basophils Absolute: 0.1 10*3/uL (ref 0.0–0.1)
Basophils Relative: 1 %
Eosinophils Absolute: 0 10*3/uL (ref 0.0–0.5)
Eosinophils Relative: 0 %
HCT: 39.6 % (ref 39.0–52.0)
Hemoglobin: 13.2 g/dL (ref 13.0–17.0)
Immature Granulocytes: 0 %
Lymphocytes Relative: 12 %
Lymphs Abs: 1.5 10*3/uL (ref 0.7–4.0)
MCH: 29.4 pg (ref 26.0–34.0)
MCHC: 33.3 g/dL (ref 30.0–36.0)
MCV: 88.2 fL (ref 80.0–100.0)
Monocytes Absolute: 1.2 10*3/uL — ABNORMAL HIGH (ref 0.1–1.0)
Monocytes Relative: 10 %
Neutro Abs: 9.3 10*3/uL — ABNORMAL HIGH (ref 1.7–7.7)
Neutrophils Relative %: 77 %
Platelets: 170 10*3/uL (ref 150–400)
RBC: 4.49 MIL/uL (ref 4.22–5.81)
RDW: 12.3 % (ref 11.5–15.5)
WBC: 12.1 10*3/uL — ABNORMAL HIGH (ref 4.0–10.5)
nRBC: 0 % (ref 0.0–0.2)

## 2023-01-27 LAB — LEVETIRACETAM LEVEL: Levetiracetam Lvl: 27.9 ug/mL (ref 10.0–40.0)

## 2023-01-27 LAB — BASIC METABOLIC PANEL
Anion gap: 8 (ref 5–15)
BUN: 9 mg/dL (ref 8–23)
CO2: 24 mmol/L (ref 22–32)
Calcium: 8.3 mg/dL — ABNORMAL LOW (ref 8.9–10.3)
Chloride: 102 mmol/L (ref 98–111)
Creatinine, Ser: 0.74 mg/dL (ref 0.61–1.24)
GFR, Estimated: >60 mL/min (ref >60)
Glucose, Bld: 110 mg/dL — ABNORMAL HIGH (ref 70–99)
Potassium: 3.3 mmol/L — ABNORMAL LOW (ref 3.5–5.1)
Sodium: 134 mmol/L — ABNORMAL LOW (ref 135–145)

## 2023-01-27 LAB — MRSA NEXT GEN BY PCR, NASAL: MRSA by PCR Next Gen: NOT DETECTED

## 2023-01-27 LAB — GLUCOSE, CAPILLARY: Glucose-Capillary: 100 mg/dL — ABNORMAL HIGH (ref 70–99)

## 2023-01-27 LAB — LACTIC ACID, PLASMA: Lactic Acid, Venous: 1 mmol/L (ref 0.5–1.9)

## 2023-01-27 MED ORDER — POTASSIUM CHLORIDE 10 MEQ/100ML IV SOLN
INTRAVENOUS | Status: AC
  Administered 2023-01-27: 10 meq
  Filled 2023-01-27: qty 100

## 2023-01-27 MED ORDER — SODIUM CHLORIDE 0.9 % IV SOLN
3.0000 g | Freq: Four times a day (QID) | INTRAVENOUS | Status: DC
  Administered 2023-01-27 – 2023-01-29 (×9): 3 g via INTRAVENOUS
  Filled 2023-01-27 (×9): qty 8

## 2023-01-27 MED ORDER — ASPIRIN 81 MG PO TBEC
81.0000 mg | DELAYED_RELEASE_TABLET | Freq: Every day | ORAL | Status: DC
  Administered 2023-01-27 – 2023-01-29 (×3): 81 mg via ORAL
  Filled 2023-01-27 (×3): qty 1

## 2023-01-27 MED ORDER — POTASSIUM CHLORIDE 10 MEQ/100ML IV SOLN
10.0000 meq | INTRAVENOUS | Status: AC
  Administered 2023-01-27 (×4): 10 meq via INTRAVENOUS
  Filled 2023-01-27 (×3): qty 100

## 2023-01-27 MED ORDER — IOHEXOL 350 MG/ML SOLN
100.0000 mL | Freq: Once | INTRAVENOUS | Status: AC | PRN
  Administered 2023-01-27: 100 mL via INTRAVENOUS

## 2023-01-27 MED ORDER — ROSUVASTATIN CALCIUM 20 MG PO TABS
20.0000 mg | ORAL_TABLET | Freq: Every day | ORAL | Status: DC
  Administered 2023-01-27 – 2023-01-29 (×3): 20 mg via ORAL
  Filled 2023-01-27 (×3): qty 1

## 2023-01-27 NOTE — Progress Notes (Signed)
VASCULAR LAB    ABI has been performed.  See CV proc for preliminary results.   Adlene Adduci, RVT 01/27/2023, 6:26 PM

## 2023-01-27 NOTE — Progress Notes (Signed)
Pharmacy Antibiotic Note  Mason Hart is a 67 y.o. male admitted on 01/26/2023 with  aspiration pneumonia and cellulitis .  Pharmacy has been consulted for Unasyn dosing.  Plan: Unasyn 3 g IV q6h Continue to monitor clinical improvement and renal function Follow up LOT  Height: '6\' 2"'$  (188 cm) Weight: 110.5 kg (243 lb 9.7 oz) IBW/kg (Calculated) : 82.2  Temp (24hrs), Avg:99.6 F (37.6 C), Min:98.3 F (36.8 C), Max:103 F (39.4 C)  Recent Labs  Lab 01/26/23 1042 01/26/23 1137 01/26/23 2029 01/27/23 0305  WBC 11.0*  --   --  12.1*  CREATININE 0.98  --   --  0.74  LATICACIDVEN 1.4 2.6* 3.2* 1.0    Estimated Creatinine Clearance: 116.9 mL/min (by C-G formula based on SCr of 0.74 mg/dL).    No Known Allergies  Antimicrobials this admission: Tamiflu 3/1 >> [3/6] Cefazolin 3/1 >> 3/2 Unasyn 3/2 >>   Dose adjustments this admission: None.  Microbiology results: 3/1 BCx: NGTD 3/1 Resp Panel: + flu A  3/1 MRSA PCR: negative  Thank you for allowing pharmacy to be a part of this patient's care.  Jerilynn Birkenhead 01/27/2023 12:45 PM

## 2023-01-27 NOTE — Progress Notes (Signed)
Subjective:   Summary: Mason Hart is a 67 y.o. year old male currently admitted on the IMTS HD#1 for acute hepatic encephalopathy.  Overnight Events: Patient had intermittent spiking fevers throughout the night ranging from 100-103F. Patient tachycardic ranging from 102-131BPM overnight, but has pulse on 96BPM during exam this morning.   Patient seems more alert and oriented than yesterday. He was oriented to person, place and time. He stated that he felt better than yesterday, but still has cough with mucus. Patient denies chest pain, N/V, or abdominal discomfort. Last drink was "a while ago" and he says he has been cutting down on alcohol consumption.  Objective:  Vital signs in last 24 hours: Vitals:   01/27/23 0500 01/27/23 0516 01/27/23 0517 01/27/23 0844  BP:  91/61 91/61 (!) 96/58  Pulse:  (!) 102 (!) 102 96  Resp:  '18 18 18  '$ Temp:  98.3 F (36.8 C) 98.3 F (36.8 C) 98.8 F (37.1 C)  TempSrc:  Oral  Oral  SpO2:  100%  100%  Weight: 110.5 kg     Height:       Supplemental O2: Nasal Cannula SpO2: 100 % O2 Flow Rate (L/min): 2 L/min   Physical Exam:  Constitutional: in no acute distress Cardiovascular: RRR, no murmurs, rubs or gallops, bilateral radial and dorsalis pedis pulses present Pulmonary/Chest: unlabored breathing on 2L via nasal cannula, lungs crackles appreciated with cough productive of thick green sputum Abdominal: soft, non-tender, tympanic to percussion Skin: cool and dry with diffuse flaky dark patches Extremities: left lower leg is warm, erythematous and edematous below the knee Neuro: A&Ox3. EOMI. No asterixis.    Filed Weights   01/26/23 1123 01/27/23 0500  Weight: 110.5 kg 110.5 kg     Intake/Output Summary (Last 24 hours) at 01/27/2023 0941 Last data filed at 01/27/2023 0846 Gross per 24 hour  Intake --  Output 800 ml  Net -800 ml   Net IO Since Admission: -800 mL [01/27/23 0941]  Pertinent Labs:    Latest Ref Rng &  Units 01/27/2023    3:05 AM 01/26/2023   11:22 AM 01/26/2023   10:42 AM  CBC  WBC 4.0 - 10.5 K/uL 12.1   11.0   Hemoglobin 13.0 - 17.0 g/dL 13.2  15.3  14.5   Hematocrit 39.0 - 52.0 % 39.6  45.0  44.6   Platelets 150 - 400 K/uL 170   168        Latest Ref Rng & Units 01/27/2023    3:05 AM 01/26/2023   11:22 AM 01/26/2023   10:42 AM  CMP  Glucose 70 - 99 mg/dL 110   108   BUN 8 - 23 mg/dL 9   10   Creatinine 0.61 - 1.24 mg/dL 0.74   0.98   Sodium 135 - 145 mmol/L 134  133  133   Potassium 3.5 - 5.1 mmol/L 3.3  3.8  3.7   Chloride 98 - 111 mmol/L 102   98   CO2 22 - 32 mmol/L 24   22   Calcium 8.9 - 10.3 mg/dL 8.3   8.7   Total Protein 6.5 - 8.1 g/dL   7.4   Total Bilirubin 0.3 - 1.2 mg/dL   1.6   Alkaline Phos 38 - 126 U/L   75   AST 15 - 41 U/L   26   ALT 0 - 44 U/L  15     Imaging: [place] ASCITES (ABDOMEN LIMITED)  Result Date: 01/26/2023 CLINICAL DATA:  Cirrhosis.  Assess for ascites. EXAM: LIMITED ABDOMEN ULTRASOUND FOR ASCITES TECHNIQUE: Limited ultrasound survey for ascites was performed in all four abdominal quadrants. COMPARISON:  None Available. FINDINGS: Four quadrant survey of the abdomen demonstrates no significant ascites. IMPRESSION: No significant ascites. Electronically Signed   By: Kerby Moors M.D.   On: 01/26/2023 17:43   CT HEAD WO CONTRAST (5MM)  Result Date: 01/26/2023 CLINICAL DATA:  Mental status change EXAM: CT HEAD WITHOUT CONTRAST TECHNIQUE: Contiguous axial images were obtained from the base of the skull through the vertex without intravenous contrast. RADIATION DOSE REDUCTION: This exam was performed according to the departmental dose-optimization program which includes automated exposure control, adjustment of the mA and/or kV according to patient size and/or use of iterative reconstruction technique. COMPARISON:  Head CT 11/27/2022.  MRI head 11/27/2022. FINDINGS: Brain: No evidence of acute infarction, hemorrhage, hydrocephalus, extra-axial collection or mass  lesion/mass effect. Again seen is mild diffuse atrophy and mild periventricular white matter hypodensity, likely chronic small vessel ischemic change. Vascular: No hyperdense vessel or unexpected calcification. Skull: Normal. Negative for fracture or focal lesion. Sinuses/Orbits: There is mucosal thickening of the paranasal sinuses diffusely. There are postsurgical changes in the right orbit. Other: None. IMPRESSION: 1. No acute intracranial process. 2. Mild diffuse atrophy and mild chronic small vessel ischemic change. 3. Diffuse paranasal sinus disease. Electronically Signed   By: Ronney Asters M.D.   On: 01/26/2023 15:50   DG Chest Port 1 View  Result Date: 01/26/2023 CLINICAL DATA:  Questionable sepsis - evaluate for abnormality EXAM: PORTABLE CHEST 1 VIEW COMPARISON:  Radiograph 11/27/2022 FINDINGS: Unchanged elevated right hemidiaphragm. Right basilar subsegmental atelectasis. There is no new airspace disease. There is no large effusion or evidence of pneumothorax. Thoracic spondylosis. Bilateral shoulder degenerative changes. Chronic left clavicle injury. No acute osseous abnormality. IMPRESSION: Unchanged elevated right hemidiaphragm with adjacent basilar subsegmental atelectasis. No new airspace disease. Electronically Signed   By: Maurine Simmering M.D.   On: 01/26/2023 10:57     Assessment/Plan:   Principal Problem:   Acute metabolic encephalopathy Active Problems:   Acute hypoxic respiratory failure (HCC)   Hepatic cirrhosis (HCC)   Cellulitis of leg, left   Influenza A   Patient Summary: Mason Hart is a 67 y.o. with a pertinent PMH of cirrhosis, who presented with fever and altered mental status and admitted for hepatic encephalopathy, influenza, and cellulitis.     #Acute Multifactorial Encephalopathy #Hepatic encephalopathy #Cirrhosis Improving on lactulose 20g PO 2x daily. Lactic acid down from 3.2 at 8 PM to reference range at 3:05 AM. No ascites, CTH negative, CXR clear. Patient  more alert and oriented during physical exam this morning.   - continue lactulose 20g - flu/cellulitis management below -ruling out PE  #Influenza #Acute hypoxic respiratory failure Improving on Tamiflu, but still has a cough producing green phlegm. Patient had temperatures ranging from 100-103F overnight, but most recent vitals show 98.71F. SpO2 is 100% on nasal cannula. Of note, given persistent tachycardia, hypoxia, r\o DVT study, low grade fevers and initial encephalopathy in context of likely chronic immobility at SNF, he is at risk for PE. [place] 6. With high suspicion, and likely elevated d-dimer in the context of acute inflammation/flu, will opt to go ahead and get CTA. - f\u CTA - continue Tamiflu 75 mg - continue supplemental O2 at 2L via nasal cannula to maintain sat greater than 92% -  PT/OT  #Lower Extremity Cellulitis Stable. Patient's left leg is still warm to touch, and swollen. No tenderness or purulence.  - continue Cefazolin 2g IV every 8 hours for 7 days until 02/02/2023.  - Vanc can be d/ced with nonpurulent cellulitis and in the context of flu likely causing fever - Pending doppler US for DVT -low c/f sepsis, f\u bcx  Diet: Heart Healthy IVF: none VTE: Rivaroxaban Code: Full Family Update: Son listed on chart, patient did not want [place] to update   Dispo: Anticipated discharge back to SNF pending ongoing workup and management, PT/OT eval.   J Hunt Medical Student (MS3)   _________________________________________________________  I have seen and examined the patient, and reviewed the daily progress note by Margy Clarks, MS 3 and discussed the care of the patient with them.   Signed:  Linus Galas, MD 01/27/2023, 10:56 AM

## 2023-01-27 NOTE — Progress Notes (Signed)
VASCULAR LAB    Bilateral lower extremity venous duplex has been performed.  See CV proc for preliminary results.   Shayleen Eppinger, RVT 01/27/2023, 6:26 PM

## 2023-01-28 DIAGNOSIS — I739 Peripheral vascular disease, unspecified: Secondary | ICD-10-CM | POA: Diagnosis present

## 2023-01-28 DIAGNOSIS — A419 Sepsis, unspecified organism: Secondary | ICD-10-CM | POA: Diagnosis not present

## 2023-01-28 DIAGNOSIS — R6520 Severe sepsis without septic shock: Secondary | ICD-10-CM | POA: Diagnosis present

## 2023-01-28 DIAGNOSIS — J101 Influenza due to other identified influenza virus with other respiratory manifestations: Secondary | ICD-10-CM | POA: Diagnosis present

## 2023-01-28 DIAGNOSIS — K7682 Hepatic encephalopathy: Secondary | ICD-10-CM | POA: Diagnosis present

## 2023-01-28 DIAGNOSIS — R410 Disorientation, unspecified: Secondary | ICD-10-CM | POA: Diagnosis present

## 2023-01-28 DIAGNOSIS — M79605 Pain in left leg: Secondary | ICD-10-CM

## 2023-01-28 DIAGNOSIS — J690 Pneumonitis due to inhalation of food and vomit: Secondary | ICD-10-CM | POA: Diagnosis present

## 2023-01-28 DIAGNOSIS — Z8249 Family history of ischemic heart disease and other diseases of the circulatory system: Secondary | ICD-10-CM | POA: Diagnosis not present

## 2023-01-28 DIAGNOSIS — K7460 Unspecified cirrhosis of liver: Secondary | ICD-10-CM | POA: Diagnosis present

## 2023-01-28 DIAGNOSIS — Z635 Disruption of family by separation and divorce: Secondary | ICD-10-CM | POA: Diagnosis not present

## 2023-01-28 DIAGNOSIS — Z1152 Encounter for screening for COVID-19: Secondary | ICD-10-CM | POA: Diagnosis not present

## 2023-01-28 DIAGNOSIS — L03116 Cellulitis of left lower limb: Secondary | ICD-10-CM | POA: Diagnosis present

## 2023-01-28 DIAGNOSIS — E039 Hypothyroidism, unspecified: Secondary | ICD-10-CM | POA: Diagnosis present

## 2023-01-28 DIAGNOSIS — Z7901 Long term (current) use of anticoagulants: Secondary | ICD-10-CM | POA: Diagnosis not present

## 2023-01-28 DIAGNOSIS — Z79899 Other long term (current) drug therapy: Secondary | ICD-10-CM | POA: Diagnosis not present

## 2023-01-28 DIAGNOSIS — J9601 Acute respiratory failure with hypoxia: Secondary | ICD-10-CM | POA: Diagnosis present

## 2023-01-28 DIAGNOSIS — Z7401 Bed confinement status: Secondary | ICD-10-CM | POA: Diagnosis not present

## 2023-01-28 DIAGNOSIS — A4189 Other specified sepsis: Secondary | ICD-10-CM | POA: Diagnosis present

## 2023-01-28 DIAGNOSIS — I10 Essential (primary) hypertension: Secondary | ICD-10-CM | POA: Diagnosis present

## 2023-01-28 DIAGNOSIS — Z5900 Homelessness unspecified: Secondary | ICD-10-CM | POA: Diagnosis not present

## 2023-01-28 DIAGNOSIS — L98429 Non-pressure chronic ulcer of back with unspecified severity: Secondary | ICD-10-CM | POA: Diagnosis present

## 2023-01-28 LAB — COMPREHENSIVE METABOLIC PANEL
ALT: 10 U/L (ref 0–44)
AST: 22 U/L (ref 15–41)
Albumin: 2.4 g/dL — ABNORMAL LOW (ref 3.5–5.0)
Alkaline Phosphatase: 51 U/L (ref 38–126)
Anion gap: 7 (ref 5–15)
BUN: 5 mg/dL — ABNORMAL LOW (ref 8–23)
CO2: 26 mmol/L (ref 22–32)
Calcium: 8.5 mg/dL — ABNORMAL LOW (ref 8.9–10.3)
Chloride: 105 mmol/L (ref 98–111)
Creatinine, Ser: 0.67 mg/dL (ref 0.61–1.24)
GFR, Estimated: >60 mL/min (ref >60)
Glucose, Bld: 88 mg/dL (ref 70–99)
Potassium: 4.1 mmol/L (ref 3.5–5.1)
Sodium: 138 mmol/L (ref 135–145)
Total Bilirubin: 0.9 mg/dL (ref 0.3–1.2)
Total Protein: 6.4 g/dL — ABNORMAL LOW (ref 6.5–8.1)

## 2023-01-28 LAB — GLUCOSE, CAPILLARY
Glucose-Capillary: 85 mg/dL (ref 70–99)
Glucose-Capillary: 94 mg/dL (ref 70–99)
Glucose-Capillary: 90 mg/dL (ref 70–99)

## 2023-01-28 LAB — LIPID PANEL
Cholesterol: 127 mg/dL (ref 0–200)
HDL: 31 mg/dL — ABNORMAL LOW (ref >40)
LDL Cholesterol: 84 mg/dL (ref 0–99)
Total CHOL/HDL Ratio: 4.1 RATIO
Triglycerides: 61 mg/dL (ref <150)
VLDL: 12 mg/dL (ref 0–40)

## 2023-01-28 LAB — CBC
HCT: 37.5 % — ABNORMAL LOW (ref 39.0–52.0)
Hemoglobin: 12.3 g/dL — ABNORMAL LOW (ref 13.0–17.0)
MCH: 29.3 pg (ref 26.0–34.0)
MCHC: 32.8 g/dL (ref 30.0–36.0)
MCV: 89.3 fL (ref 80.0–100.0)
Platelets: 159 10*3/uL (ref 150–400)
RBC: 4.2 MIL/uL — ABNORMAL LOW (ref 4.22–5.81)
RDW: 12.7 % (ref 11.5–15.5)
WBC: 6.5 10*3/uL (ref 4.0–10.5)
nRBC: 0 % (ref 0.0–0.2)

## 2023-01-28 LAB — VAS US ABI WITH/WO TBI

## 2023-01-28 MED ORDER — LACTATED RINGERS IV BOLUS
500.0000 mL | Freq: Once | INTRAVENOUS | Status: AC
  Administered 2023-01-28: 500 mL via INTRAVENOUS

## 2023-01-28 NOTE — Consult Note (Addendum)
Hospital Consult    Reason for Consult: Suspected cellulitis of left lower extremity Requesting Physician: Primary teaching service MRN #:  [id number]  History of Present Illness: This is a 67 y.o. male who is being seen in consultation for evaluation of suspected cellulitis of the left lower extremity.  Mason Hart is homeless.  He states he is able to ambulate with a walker.  Workup included ABIs demonstrating noncompressible tibial vessels and poor TBI's bilaterally.  He denies any history of claudication.  He also denies any open wounds of bilateral lower extremities.  He is unaware of any wounds or cellulitis of his legs.  He is on Xarelto.  He is being treated for hepatic encephalopathy and influenza infection.  Past Medical History:  Diagnosis Date   Alcohol abuse    Hypertension    Hypothyroidism    Noncompliance with medication regimen    RBBB (right bundle branch block)    Seizures (HCC)     Past Surgical History:  Procedure Laterality Date   NO PAST SURGERIES      No Known Allergies  Prior to Admission medications   Medication Sig Start Date End Date Taking? Authorizing Provider  acetaminophen (TYLENOL) 325 MG tablet Take 2 tablets (650 mg total) by mouth every 6 (six) hours as needed for mild pain (or Fever >/= 101). 01/13/21  Yes Matilde Haymaker, MD  EMOLLIENT EX Apply 1 application  topically 2 (two) times daily. Emollient Lotion. To arms and legs for dry skin.   Yes [provider]  ferrous sulfate 324 MG TBEC Take 324 mg by mouth daily.   Yes [provider]  folic acid (FOLVITE) 1 MG tablet Take 1 tablet (1 mg total) by mouth daily. 01/14/21  Yes Matilde Haymaker, MD  ketoconazole (NIZORAL) 2 % shampoo Apply 1 Application topically See admin instructions. Apply topically twice a week on Monday, Thursday for tinea versicolor.   Yes [provider]  levETIRAcetam (KEPPRA) 500 MG tablet Take 1,000 mg by mouth 2 (two) times daily.   Yes [provider]  Multiple Vitamin (MULTIVITAMIN WITH MINERALS) TABS tablet Take 1 tablet by mouth daily. 01/14/21  Yes Matilde Haymaker, MD  pantoprazole (PROTONIX) 20 MG tablet Take 1 tablet (20 mg total) by mouth daily. 01/14/21  Yes Matilde Haymaker, MD  thiamine 100 MG tablet Take 1 tablet (100 mg total) by mouth daily. 01/14/21  Yes Matilde Haymaker, MD  feeding supplement (ENSURE ENLIVE / ENSURE PLUS) LIQD Take 237 mLs by mouth 3 (three) times daily between meals. Patient not taking: Reported on 01/26/2023 01/13/21   Matilde Haymaker, MD  levETIRAcetam (KEPPRA) 1000 MG tablet Take 1 tablet (1,000 mg total) by mouth 2 (two) times daily. Last fill! Must keep appointment. Patient not taking: Reported on 01/26/2023 04/29/20 11/27/22  Marcial Pacas, MD    Social History   Socioeconomic History   Marital status: Legally Separated    Spouse name: Not on file   Number of children: 0   Years of education: 12   Highest education level: High school graduate  Occupational History   Occupation: Retired  Tobacco Use   Smoking status: Never   Smokeless tobacco: Never  Scientific laboratory technician Use: Never used  Substance and Sexual Activity   Alcohol use: Not Currently   Drug use: No   Sexual activity: Not Currently    Birth control/protection: None  Other Topics Concern   Not on file  Social History Narrative   Lives alone.  Right-handed.   Occasional use of caffeine.   Social Determinants of Health   Financial Resource Strain: Not on file  Food Insecurity: No Food Insecurity (01/26/2023)   Hunger Vital Sign    Worried About Running Out of Food in the Last Year: Never true    Ran Out of Food in the Last Year: Never true  Transportation Needs: No Transportation Needs (01/26/2023)   PRAPARE - Hydrologist (Medical): No    Lack of Transportation (Non-Medical): No  Physical Activity: Not on file  Stress: Not on file  Social Connections: Not on file  Intimate Partner Violence: Not At Risk  (01/26/2023)   Humiliation, Afraid, Rape, and Kick questionnaire    Fear of Current or Ex-Partner: No    Emotionally Abused: No    Physically Abused: No    Sexually Abused: No     Family History  Problem Relation Age of Onset   Heart disease Mother    Other Father        unsure of medical history   Cancer Neg Hx    Diabetes Neg Hx    Stroke Neg Hx     ROS: Otherwise negative unless mentioned in HPI  Physical Examination  Vitals:   01/28/23 0831 01/28/23 0831  BP: (!) 161/136 (!) 161/136  Pulse: 81 81  Resp: 17 17  Temp: 98.4 F (36.9 C) 98.4 F (36.9 C)  SpO2: 100% 100%   Body mass index is 31.28 kg/m.  General:  WDWN in NAD Gait: Not observed HENT: WNL, normocephalic Pulmonary: normal non-labored breathing, without Rales, rhonchi,  wheezing Cardiac: regular Abdomen:  soft, NT/ND, no masses Skin: without rashes Vascular Exam/Pulses: 2+ palpable DP pulses; 2+ palpable PT pulses Extremities: Severe flaking of the skin below the knee bilaterally with stasis pigmentation changes bilaterally; no obvious cellulitis; no obvious wounds of bilateral lower extremities; feet are warm with motor and sensation intact Musculoskeletal: no muscle wasting or atrophy  Neurologic: A&O X 3;  No focal weakness or paresthesias are detected; speech is fluent/normal Psychiatric:  The pt has Normal affect. Lymph:  Unremarkable  CBC    Component Value Date/Time   WBC 6.5 01/28/2023 0846   RBC 4.20 (L) 01/28/2023 0846   HGB 12.3 (L) 01/28/2023 0846   HGB 11.6 (L) 04/13/2020 1540   HCT 37.5 (L) 01/28/2023 0846   HCT 35.7 (L) 04/13/2020 1540   PLT 159 01/28/2023 0846   PLT 302 04/13/2020 1540   MCV 89.3 01/28/2023 0846   MCV 89 04/13/2020 1540   MCH 29.3 01/28/2023 0846   MCHC 32.8 01/28/2023 0846   RDW 12.7 01/28/2023 0846   RDW 13.9 04/13/2020 1540   LYMPHSABS 1.5 01/27/2023 0305   MONOABS 1.2 (H) 01/27/2023 0305   EOSABS 0.0 01/27/2023 0305   BASOSABS 0.1 01/27/2023 0305     BMET    Component Value Date/Time   NA 134 (L) 01/27/2023 0305   NA 133 (L) 04/13/2020 1540   K 3.3 (L) 01/27/2023 0305   CL 102 01/27/2023 0305   CO2 24 01/27/2023 0305   GLUCOSE 110 (H) 01/27/2023 0305   BUN 9 01/27/2023 0305   BUN 25 04/13/2020 1540   CREATININE 0.74 01/27/2023 0305   CALCIUM 8.3 (L) 01/27/2023 0305   GFRNONAA >60 01/27/2023 0305   GFRAA 82 04/13/2020 1540    COAGS: Lab Results  Component Value Date   INR 1.1 01/26/2023   INR 1.2 11/28/2022   INR 1.3 (H)  01/06/2021     Non-Invasive Vascular Imaging:   ABI/TBIToday's ABI     Today's TBIPrevious ABIPrevious TBI  +-------+----------------+-----------+------------+------------+  Right Non compressible0.47                                 +-------+----------------+-----------+------------+------------+  Left  Non compressible0.22                                 +-------+----------------+-----------+------------+---     ASSESSMENT/PLAN: This is a 67 y.o. male seen in consultation for evaluation of PAD and suspected left lower extremity cellulitis  -Despite poor TBI's bilaterally, patient has easily palpable pedal pulses bilaterally.  While I do not appreciate any obvious cellulitis, he does have venous stasis changes of the skin of both legs below the knee.  DVT duplex of bilateral lower extremities were negative.  Agree that fevers are likely related to influenza infection.  No indication for further workup of PAD given palpable pedal pulses.  Patient can be fitted for compression in the future if he is bothered by edema.  Nothing to offer from a vascular surgery standpoint.  On-call vascular surgeon Dr. Carlis Abbott will evaluate the patient later today and provide further treatment plans.   Dagoberto Ligas PA-C [facility] [phone]  I have seen and evaluated the patient. I agree with the PA note as documented above.  67 year old male admitted with hepatic  encephalopathy as well as aspiration pneumonia that vascular surgery has been consulted for evaluation of PAD with suspected left lower extremity cellulitis.  Patient tells me that he is minimally ambulatory but is able to stand and transfer.  He had ABIs yesterday that were noncompressible with multiphasic waveforms at the ankle.  He denies any pain in his foot including the left foot.  States he has no pain at night.  He has no distal ulcerations.  On exam he has palpable dorsalis pedis and posterior tibial pulses in both lower extremities.  No signs of critical limb ischemia like rest pain or tissue loss.  He does not need any intervention for vascular surgery.  I do not appreciate any cellulitis on exam but maybe this has improved with antibiotics.  We will sign off.  Call with questions or concerns.  Marty Heck, MD [facility] [place] Office: [phone]

## 2023-01-28 NOTE — Progress Notes (Addendum)
Subjective:   Summary: Mason Hart is a 67 y.o. year old male currently admitted on the IMTS HD#1 for acute hepatic encephalopathy.  Overnight Events: CT PE yesterday with no evidence of PE but revealed likely aspiration pneumonia, was transitioned to unasyn yesterday. ABIs showing severe bilateral PAD. No DVTs on venous dopplers.  He is more alert and oriented on exam, knows name, location, year. States he feels fine and denies any acute complaints at this time. Still has a mild cough with yellowish mucus production. No chest pain, N/V, abdominal discomfort, SHOB.   Objective:  Vital signs in last 24 hours: Vitals:   01/27/23 2326 01/28/23 0334 01/28/23 0831 01/28/23 0831  BP: (!) 111/58 97/62 (!) 161/136 (!) 161/136  Pulse: 89 93 81 81  Resp: '18 18 17 17  '$ Temp: 99.6 F (37.6 C) 98.9 F (37.2 C) 98.4 F (36.9 C) 98.4 F (36.9 C)  TempSrc: Oral  Oral Oral  SpO2: 93% 93% 100% 100%  Weight:      Height:       Supplemental O2: Nasal Cannula SpO2: 100 % O2 Flow Rate (L/min): 2 L/min FiO2 (%): (!) 2 %   Physical Exam:  General: elderly male, sitting up in bed, NAD. CV: normal rate and regular rhythm, no m/r/g. DP pulse 1+ in RLE and faint in LLE. Pulm: normal WOB on 2L [place]. Bibasilar crackles and coarse lung sounds noted.  Abdomen: soft, nondistended, nontender to palpation, normoactive bowel sounds. Skin: cool with diffuse flaky/dry skin.  Extremities: warm, erythematous, and edematous LLE -- gradually improving. Neuro: AAOx3, no asterixis, moving all extremities spontaneously.   Filed Weights   01/26/23 1123 01/27/23 0500  Weight: 110.5 kg 110.5 kg     Intake/Output Summary (Last 24 hours) at 01/28/2023 0910 Last data filed at 01/28/2023 0500 Gross per 24 hour  Intake 493.33 ml  Output 1000 ml  Net -506.67 ml   Net IO Since Admission: -856.67 mL [01/28/23 0910]  Pertinent Labs:    Latest Ref Rng & Units 01/27/2023    3:05 AM 01/26/2023   11:22 AM  01/26/2023   10:42 AM  CBC  WBC 4.0 - 10.5 K/uL 12.1   11.0   Hemoglobin 13.0 - 17.0 g/dL 13.2  15.3  14.5   Hematocrit 39.0 - 52.0 % 39.6  45.0  44.6   Platelets 150 - 400 K/uL 170   168        Latest Ref Rng & Units 01/27/2023    3:05 AM 01/26/2023   11:22 AM 01/26/2023   10:42 AM  CMP  Glucose 70 - 99 mg/dL 110   108   BUN 8 - 23 mg/dL 9   10   Creatinine 0.61 - 1.24 mg/dL 0.74   0.98   Sodium 135 - 145 mmol/L 134  133  133   Potassium 3.5 - 5.1 mmol/L 3.3  3.8  3.7   Chloride 98 - 111 mmol/L 102   98   CO2 22 - 32 mmol/L 24   22   Calcium 8.9 - 10.3 mg/dL 8.3   8.7   Total Protein 6.5 - 8.1 g/dL   7.4   Total Bilirubin 0.3 - 1.2 mg/dL   1.6   Alkaline Phos 38 - 126 U/L   75   AST 15 - 41 U/L   26   ALT 0 - 44 U/L   15  Imaging: VAS [place] LOWER EXTREMITY VENOUS (DVT)  Result Date: 01/27/2023  Lower Venous DVT Study Patient Name:  TOREAN SOLAR  Date of Exam:   01/27/2023 Medical Rec #: [id number]     Accession #:    [id number] Date of Birth: 01-Dec-1955     Patient Gender: M Patient Age:   29 years Exam Location:  [facility] Procedure:      VAS [place] LOWER EXTREMITY VENOUS (DVT) Referring Phys: Velna Ochs --------------------------------------------------------------------------------  Indications: Pain, Swelling, and cellulitis.  Limitations: Body habitus and immobility. Comparison Study: Prior negative Left LEV done 01/02/21 and 03/19/19 Performing Technologist: Sharion Dove RVS  Examination Guidelines: A complete evaluation includes B-mode imaging, spectral Doppler, color Doppler, and power Doppler as needed of all accessible portions of each vessel. Bilateral testing is considered an integral part of a complete examination. Limited examinations for reoccurring indications may be performed as noted. The reflux portion of the exam is performed with the patient in reverse Trendelenburg.  +---------+---------------+---------+-----------+----------+--------------+ RIGHT     CompressibilityPhasicitySpontaneityPropertiesThrombus Aging +---------+---------------+---------+-----------+----------+--------------+ CFV      Full           Yes      Yes                                 +---------+---------------+---------+-----------+----------+--------------+ SFJ      Full                                                        +---------+---------------+---------+-----------+----------+--------------+ FV Prox  Full                                                        +---------+---------------+---------+-----------+----------+--------------+ FV Mid   Full           Yes      Yes                                 +---------+---------------+---------+-----------+----------+--------------+ FV DistalFull                                                        +---------+---------------+---------+-----------+----------+--------------+ PFV      Full                                                        +---------+---------------+---------+-----------+----------+--------------+ POP      Full           Yes      Yes                                 +---------+---------------+---------+-----------+----------+--------------+ PTV      Full                                                        +---------+---------------+---------+-----------+----------+--------------+  PERO     Full                                                        +---------+---------------+---------+-----------+----------+--------------+   +---------+---------------+---------+-----------+----------+-------------------+ LEFT     CompressibilityPhasicitySpontaneityPropertiesThrombus Aging      +---------+---------------+---------+-----------+----------+-------------------+ CFV      Full           Yes      Yes                                      +---------+---------------+---------+-----------+----------+-------------------+ SFJ      Full                                                              +---------+---------------+---------+-----------+----------+-------------------+ FV Prox  Full                                                             +---------+---------------+---------+-----------+----------+-------------------+ FV Mid   Full           Yes      Yes                                      +---------+---------------+---------+-----------+----------+-------------------+ FV DistalFull                                                             +---------+---------------+---------+-----------+----------+-------------------+ PFV      Full                                                             +---------+---------------+---------+-----------+----------+-------------------+ POP                     Yes      Yes                  patent by color and                                                       Doppler             +---------+---------------+---------+-----------+----------+-------------------+ PTV  patent by color     +---------+---------------+---------+-----------+----------+-------------------+ PERO                                                  patent by color     +---------+---------------+---------+-----------+----------+-------------------+    Summary: BILATERAL: - No evidence of deep vein thrombosis seen in the lower extremities, bilaterally. -No evidence of popliteal cyst, bilaterally. RIGHT: - Ultrasound characteristics of enlarged lymph nodes are noted in the groin.  LEFT: - Ultrasound characteristics of enlarged lymph nodes noted in the groin.  *See table(s) above for measurements and observations.    Preliminary    VAS [place] ABI WITH/WO TBI  Result Date: 01/27/2023  LOWER EXTREMITY DOPPLER STUDY Patient Name:  ZENO MATERNA  Date of Exam:   01/27/2023 Medical Rec #: [id number]     Accession #:    [id number] Date of Birth: 01-Jan-1956      Patient Gender: M Patient Age:   6 years Exam Location:  [facility] Procedure:      VAS [place] ABI WITH/WO TBI Referring Phys: Velna Ochs --------------------------------------------------------------------------------  Indications: Cold extremities. Skin changes. Cellulitis Other Factors: Alcoholic cirrhosis.  Comparison Study: No prior study on file Performing Technologist: Sharion Dove RVS  Examination Guidelines: A complete evaluation includes at minimum, Doppler waveform signals and systolic blood pressure reading at the level of bilateral brachial, anterior tibial, and posterior tibial arteries, when vessel segments are accessible. Bilateral testing is considered an integral part of a complete examination. Photoelectric Plethysmograph (PPG) waveforms and toe systolic pressure readings are included as required and additional duplex testing as needed. Limited examinations for reoccurring indications may be performed as noted.  ABI Findings: +---------+------------------+-----+-----------+--------+ Right    Rt Pressure (mmHg)IndexWaveform   Comment  +---------+------------------+-----+-----------+--------+ Brachial 119                    triphasic           +---------+------------------+-----+-----------+--------+ PTA      136               1.13 multiphasic         +---------+------------------+-----+-----------+--------+ DP       156               1.30 multiphasic         +---------+------------------+-----+-----------+--------+ Great Toe56                0.47 Abnormal            +---------+------------------+-----+-----------+--------+ +---------+------------------+-----+-----------+-------+ Left     Lt Pressure (mmHg)IndexWaveform   Comment +---------+------------------+-----+-----------+-------+ Brachial 120                    triphasic          +---------+------------------+-----+-----------+-------+ PTA      148               1.23 multiphasic         +---------+------------------+-----+-----------+-------+ DP       156               1.30 multiphasic        +---------+------------------+-----+-----------+-------+ Great Toe26                0.22 Abnormal           +---------+------------------+-----+-----------+-------+ +-------+----------------+-----------+------------+------------+ ABI/TBIToday's ABI  Today's TBIPrevious ABIPrevious TBI +-------+----------------+-----------+------------+------------+ Right  Non compressible0.47                                +-------+----------------+-----------+------------+------------+ Left   Non compressible0.22                                +-------+----------------+-----------+------------+------------+   Summary: Right: Resting right ankle-brachial index indicates noncompressible right lower extremity arteries. The right toe-brachial index is abnormal. Left: Resting left ankle-brachial index indicates noncompressible left lower extremity arteries. The left toe-brachial index is abnormal. *See table(s) above for measurements and observations.     Preliminary    CT Angio Chest Pulmonary Embolism (PE) W or WO Contrast  Result Date: 01/27/2023 CLINICAL DATA:  Pulmonary embolism (PE) suspected, high prob EXAM: CT ANGIOGRAPHY CHEST WITH CONTRAST TECHNIQUE: Multidetector CT imaging of the chest was performed using the standard protocol during bolus administration of intravenous contrast. Multiplanar CT image reconstructions and MIPs were obtained to evaluate the vascular anatomy. RADIATION DOSE REDUCTION: This exam was performed according to the departmental dose-optimization program which includes automated exposure control, adjustment of the mA and/or kV according to patient size and/or use of iterative reconstruction technique. CONTRAST:  149m OMNIPAQUE IOHEXOL 350 MG/ML SOLN COMPARISON:  October 21, 2020 FINDINGS: Cardiovascular: Evaluation is limited by contrast timing and  motion. No pulmonary embolism through the segmental pulmonary arteries. Heart is mildly enlarged. Pericardial effusion. Thoracic aorta is normal in course and caliber. Mediastinum/Nodes: Visualized thyroid is unremarkable. No axillary adenopathy. Mediastinal lymph nodes are mildly prominent, likely reactive. Representative pretracheal lymph node measures 12 mm in the short axis (series 7, image 113). Lungs/Pleura: Multifocal tracheal and endobronchial debris with downstream hypoenhancing consolidative opacity of the near complete RIGHT lower lobe. Scattered hypoenhancing linear opacities of the LEFT lower lobe downstream of endobronchial debris. Bronchial wall thickening. Upper Abdomen: Nodular liver contour consistent with history of cirrhosis. Cholelithiasis. Grossly similar subcentimeter hypodense mass of the posterior RIGHT liver and hepatic dome (series 5, image 112; series 5, image 75). Favored tiny hiatal hernia. Musculoskeletal: Flowing anterior osteophytes with relative preservation of the disc space likely reflects underlying diffuse idiopathic skeletal hyperostosis. Nodular gynecomastia bilaterally. Review of the MIP images confirms the above findings. IMPRESSION: 1. No acute pulmonary embolism through the segmental pulmonary arteries. If persistent clinical concern, recommend dedicated lower extremity venous ultrasound. 2. Multifocal tracheal and endobronchial debris with downstream hypoenhancing consolidative opacity of the near complete RIGHT lower lobe. Findings are favored to reflect sequela of aspiration and/or infection. 3. Nodular liver contour consistent with history of cirrhosis. 4. Cholelithiasis. Electronically Signed   By: SValentino SaxonM.D.   On: 01/27/2023 12:28     Assessment/Plan:   Principal Problem:   Sepsis ([place] Active Problems:   Acute hypoxic respiratory failure (HCC)   Hepatic cirrhosis (HCC)   Acute metabolic encephalopathy   Cellulitis of leg, left   Influenza  A   Patient Summary: JErvey Schuris a 67y.o. with a pertinent PMH of cirrhosis, who presented with fever and altered mental status and admitted for hepatic encephalopathy, influenza, and cellulitis.     Acute Multifactorial Encephalopathy - resolved Hepatic encephalopathy - resolved Cirrhosis Patient back to baseline mentation, oriented on exam. Improved with lactulose therapy. Lactate has normalized. No ascites noted on RUQ U/S. CTH negative. CT PE revealed nodular liver contour consistent with cirrhosis. Encephalopathy likely was  due to some component of hepatic encephalopathy along with underlying infections (cellulitis, influenza A, and aspiration PNA). -continue lactulose 20g BID -continue management of infections as noted below  Acute hypoxic respiratory failure Aspiration PNA Influenza A infection Remained afebrile overnight and leukocytosis resolved. CT PE with no evidence of PE but noted aspiration PNA involving RLL, likely secondary to presenting encephalopathy. He was initiated on unasyn 3/2 for a 5-day course, which will also cover cellulitis. Saturating well on 2L [place], will wean as able. Noted to be Flu A positive on admission, he is on day 3 of tamiflu. Venous dopplers negative for DVTs as well. -continue tamiflu (day 3/5) -continue unasyn (day 2/5) -supplemental O2 via [place], wean as able to maintain sats >92% -PT/OT  LLE non-purulent Cellulitis Severe bilateral PAD Initially presented with sepsis, which has since resolved. Cellulitis does appear to be improving, although LLE still with warmth, erythema, and mild edema. No tenderness or purulence noted. ABIs did reveal severe PAD (L>R) but has palpable DP pulses (faint in LLE). Consulted vascular, Dr. Carlis Abbott, who will come by to assess later today but likely will not warrant any immediate intervention. Transitioned to unasyn 3/2 to cover cellulitis and concomitant aspiration PNA. -Ancef 3/1 > 3/2 -Unasyn initiated on 3/2 (now  day 2/5) -blood cultures no growth for past 2 days -Vascular consulted, appreciate recommendations -ASA and crestor initiated  Diet: Heart Healthy IVF: none VTE: Rivaroxaban Code: Full Family Update: Son listed on chart, patient did not want [place] to update  Dispo: Anticipated discharge back to SNF pending ongoing workup and management.   Signed:  Virl Axe, MD 01/28/2023, 9:10 AM

## 2023-01-29 DIAGNOSIS — J101 Influenza due to other identified influenza virus with other respiratory manifestations: Secondary | ICD-10-CM | POA: Diagnosis not present

## 2023-01-29 DIAGNOSIS — A419 Sepsis, unspecified organism: Secondary | ICD-10-CM

## 2023-01-29 DIAGNOSIS — J9601 Acute respiratory failure with hypoxia: Secondary | ICD-10-CM

## 2023-01-29 DIAGNOSIS — K7682 Hepatic encephalopathy: Secondary | ICD-10-CM

## 2023-01-29 LAB — CBC
HCT: 39 % (ref 39.0–52.0)
Hemoglobin: 12.5 g/dL — ABNORMAL LOW (ref 13.0–17.0)
MCH: 29.1 pg (ref 26.0–34.0)
MCHC: 32.1 g/dL (ref 30.0–36.0)
MCV: 90.7 fL (ref 80.0–100.0)
Platelets: 160 10*3/uL (ref 150–400)
RBC: 4.3 MIL/uL (ref 4.22–5.81)
RDW: 12.7 % (ref 11.5–15.5)
WBC: 5 10*3/uL (ref 4.0–10.5)
nRBC: 0 % (ref 0.0–0.2)

## 2023-01-29 LAB — GLUCOSE, CAPILLARY
Glucose-Capillary: 63 mg/dL — ABNORMAL LOW (ref 70–99)
Glucose-Capillary: 63 mg/dL — ABNORMAL LOW (ref 70–99)
Glucose-Capillary: 80 mg/dL (ref 70–99)

## 2023-01-29 LAB — BASIC METABOLIC PANEL
Anion gap: 10 (ref 5–15)
BUN: 8 mg/dL (ref 8–23)
CO2: 26 mmol/L (ref 22–32)
Calcium: 8.7 mg/dL — ABNORMAL LOW (ref 8.9–10.3)
Chloride: 100 mmol/L (ref 98–111)
Creatinine, Ser: 0.61 mg/dL (ref 0.61–1.24)
GFR, Estimated: >60 mL/min (ref >60)
Glucose, Bld: 103 mg/dL — ABNORMAL HIGH (ref 70–99)
Potassium: 3.7 mmol/L (ref 3.5–5.1)
Sodium: 136 mmol/L (ref 135–145)

## 2023-01-29 LAB — HEMOGLOBIN A1C
Hgb A1c MFr Bld: 4.8 % (ref 4.8–5.6)
Mean Plasma Glucose: 91 mg/dL

## 2023-01-29 MED ORDER — ROSUVASTATIN CALCIUM 20 MG PO TABS: 20.0000 mg | ORAL_TABLET | Freq: Every day | ORAL | Status: AC

## 2023-01-29 MED ORDER — LACTULOSE 10 GM/15ML PO SOLN: 20.0000 g | Freq: Two times a day (BID) | ORAL | 0 refills | Status: AC

## 2023-01-29 MED ORDER — AMOXICILLIN-POT CLAVULANATE 875-125 MG PO TABS: 1.0000 | ORAL_TABLET | Freq: Two times a day (BID) | ORAL | 0 refills | Status: AC

## 2023-01-29 MED ORDER — OSELTAMIVIR PHOSPHATE 75 MG PO CAPS: 75.0000 mg | ORAL_CAPSULE | Freq: Two times a day (BID) | ORAL | 0 refills | Status: AC

## 2023-01-29 MED ORDER — ASPIRIN 81 MG PO TBEC: 81.0000 mg | DELAYED_RELEASE_TABLET | Freq: Every day | ORAL | 12 refills | Status: AC

## 2023-01-29 NOTE — Progress Notes (Signed)
Hypoglycemic Event  CBG: 63  Treatment: 4 oz juice/soda and 8 oz juice/soda  Symptoms: None  Follow-up CBG: Time:659 CBG Result:63  Possible Reasons for Event: Unknown  Comments/MD notified:   4 more oz of juice given  Repeat check 0721 CBG 80  Lavere Stork A Maryella Abood

## 2023-01-29 NOTE — Evaluation (Signed)
Physical Therapy Evaluation Patient Details Name: Mason Hart MRN: [id number] DOB: 1956-04-04 Today's Date: 01/29/2023  History of Present Illness  Pt is a 67 y.o. M who presents 01/26/2023 with fever and AMS. Pt admitted with hepatic encephalopathy, influenza and cellulitis. Significant PMH: cirrhosis.  Clinical Impression  Pt admitted with above. Presents with generalized weakness, impaired balance, and decreased activity tolerance. Pt requiring heavy moderate assist for bed mobility and transfers to standing using RW. Unable to ambulate. Per chart review, pt from [facility]; pt not forthcoming with PLOF or functional status. Recommend return to SNF at d/c.  SATURATION QUALIFICATIONS: (This note is used to comply with regulatory documentation for home oxygen)  Patient Saturations on Room Air at Rest = 97%  Patient Saturations on Room Air while Ambulating = 97%  Patient Saturations on 0 Liters of oxygen while Ambulating = N/A  Please briefly explain why patient needs home oxygen: Pt does not require home oxygen     Recommendations for follow up therapy are one component of a multi-disciplinary discharge planning process, led by the attending physician.  Recommendations may be updated based on patient status, additional functional criteria and insurance authorization.  Follow Up Recommendations Skilled nursing-short term rehab (<3 hours/day) Can patient physically be transported by private vehicle: No    Assistance Recommended at Discharge Intermittent Supervision/Assistance  Patient can return home with the following  A lot of help with walking and/or transfers;A lot of help with bathing/dressing/bathroom;Assistance with cooking/housework;Help with stairs or ramp for entrance    Equipment Recommendations None recommended by PT  Recommendations for Other Services       Functional Status Assessment Patient has had a recent decline in their functional status and demonstrates the ability  to make significant improvements in function in a reasonable and predictable amount of time.     Precautions / Restrictions Precautions Precautions: Fall Restrictions Weight Bearing Restrictions: No      Mobility  Bed Mobility Overal bed mobility: Needs Assistance Bed Mobility: Supine to Sit     Supine to sit: Mod assist     General bed mobility comments: Progressing BLE's to edge of bed slowly, modA to pivot hips to EOB, pt pushing off of bed rail to sit up    Transfers Overall transfer level: Needs assistance Equipment used: Rolling walker (2 wheels) Transfers: Sit to/from Stand, Bed to chair/wheelchair/BSC Sit to Stand: Mod assist, From elevated surface   Step pivot transfers: Mod assist       General transfer comment: ModA to boost up to stand; pt successful on second attempt while PT heavily stabilized walker. Difficulty placing LLE posteriorly. Pivoting over to chair with increased time/effort    Ambulation/Gait                  Stairs            Wheelchair Mobility    Modified Rankin (Stroke Patients Only)       Balance Overall balance assessment: Needs assistance Sitting-balance support: Feet supported, Single extremity supported Sitting balance-Leahy Scale: Poor Sitting balance - Comments: reliant on at least single UE support   Standing balance support: Bilateral upper extremity supported, Reliant on assistive device for balance Standing balance-Leahy Scale: Poor                               Pertinent Vitals/Pain Pain Assessment Pain Assessment: No/denies pain    Home Living Family/patient expects to be discharged  to:: Skilled nursing facility                   Additional Comments: Per chart review, pt at [facility] prior to admission    Prior Function Prior Level of Function : Needs assist             Mobility Comments: Pt not forthcoming with information. State he has worked with PT in the past ADLs  Comments: Pt reports he "takes a bath when he can get one," did not elaborate of level of assist with ADL's     Hand Dominance   Dominant Hand: Right    Extremity/Trunk Assessment   Upper Extremity Assessment Upper Extremity Assessment: Generalized weakness    Lower Extremity Assessment Lower Extremity Assessment: Generalized weakness       Communication   Communication: No difficulties  Cognition Arousal/Alertness: Awake/alert Behavior During Therapy: Flat affect Overall Cognitive Status: No family/caregiver present to determine baseline cognitive functioning                                 General Comments: Pt with PMH of encephalopathy. Pt A&O to person, place, and time, but with inconsistences with chart and pt provided history. Following all one step commands with increased time.        General Comments      Exercises     Assessment/Plan    PT Assessment Patient needs continued PT services  PT Problem List Decreased strength;Decreased range of motion;Decreased cognition;Decreased activity tolerance;Decreased knowledge of use of DME;Decreased balance;Decreased mobility;Decreased knowledge of precautions;Decreased safety awareness       PT Treatment Interventions DME instruction;Therapeutic exercise;Gait training;Balance training;Functional mobility training;Therapeutic activities;Patient/family education    PT Goals (Current goals can be found in the Care Plan section)  Acute Rehab PT Goals Patient Stated Goal: did not state PT Goal Formulation: With patient Time For Goal Achievement: 02/12/23 Potential to Achieve Goals: Fair    Frequency Min 2X/week     Co-evaluation               AM-PAC PT "6 Clicks" Mobility  Outcome Measure Help needed turning from your back to your side while in a flat bed without using bedrails?: A Lot Help needed moving from lying on your back to sitting on the side of a flat bed without using bedrails?: A  Lot Help needed moving to and from a bed to a chair (including a wheelchair)?: A Lot Help needed standing up from a chair using your arms (e.g., wheelchair or bedside chair)?: A Lot Help needed to walk in hospital room?: Total Help needed climbing 3-5 steps with a railing? : Total 6 Click Score: 10    End of Session Equipment Utilized During Treatment: Gait belt Activity Tolerance: Patient tolerated treatment well Patient left: in chair;with call bell/phone within reach Nurse Communication: Mobility status PT Visit Diagnosis: Other abnormalities of gait and mobility (R26.89);Muscle weakness (generalized) (M62.81)    Time: [id number] PT Time Calculation (min) (ACUTE ONLY): 26 min   Charges:   PT Evaluation $PT Eval Moderate Complexity: 1 Mod PT Treatments $Therapeutic Activity: 8-22 mins        Wyona Almas, PT, DPT Acute Rehabilitation Services Office [phone]   Deno Etienne 01/29/2023, 1:17 PM

## 2023-01-29 NOTE — TOC Transition Note (Addendum)
Transition of Care [facility]) - CM/SW Discharge Note   Patient Details  Name: Mason Hart MRN: [id number] Date of Birth: 1956/08/21  Transition of Care [facility]) CM/SW Contact:  Geralynn Ochs, LCSW Phone Number: 01/29/2023, 2:39 PM   Clinical Narrative:   Patient from [place] and is able to return. CSW sent discharge information, contacted [facility] for transportation. Left a voicemail for son, [name], to update.   Nurse to call report to [phone].  UPDATE: CSW received a call from patient's son asking about possibility of relocating. CSW explained there are no LTC beds in [place], and son indicated understanding. CSW contacted [facility] on son's behalf to ask about patient being placed on Medicaid waitlist.   Final next level of care: Skilled Nursing Facility Barriers to Discharge: Barriers Resolved   Patient Goals and CMS Choice      Discharge Placement                Patient chooses bed at:  [facility]) Patient to be transferred to facility by: [facility] Name of family member notified: [name] Patient and family notified of of transfer: 01/29/23  Discharge Plan and Services Additional resources added to the After Visit Summary for                                       Social Determinants of Health (SDOH) Interventions SDOH Screenings   Food Insecurity: No Food Insecurity (01/26/2023)  Housing: Low Risk  (01/26/2023)  Transportation Needs: No Transportation Needs (01/26/2023)  Utilities: Not At Risk (01/26/2023)  Depression (PHQ2-9): Low Risk  (06/04/2019)  Tobacco Use: Low Risk  (01/26/2023)     Readmission Risk Interventions     No data to display

## 2023-01-29 NOTE — Discharge Summary (Signed)
Name: Hart Hart MRN: [id number] DOB: 02/07/1956 67 y.o. PCP: Hart Pounds, NP  Date of Admission: 01/26/2023 10:16 AM Date of Discharge: 01/29/2023 Attending Physician: Dr.  Cain Sieve  Discharge Diagnosis: Principal Problem:   Sepsis [facility]) Active Problems:   Acute hypoxic respiratory failure ([place])   Hepatic cirrhosis ([place])   Acute metabolic encephalopathy   Cellulitis of leg, left   Influenza A   Encephalopathy, hepatic ([place])    Discharge Medications: Allergies as of 01/29/2023   No Known Allergies      Medication List     TAKE these medications    acetaminophen 325 MG tablet Commonly known as: TYLENOL Take 2 tablets (650 mg total) by mouth every 6 (six) hours as needed for mild pain (or Fever >/= 101).   amoxicillin-clavulanate 875-125 MG tablet Commonly known as: AUGMENTIN Take 1 tablet by mouth 2 (two) times daily for 2 days. Start taking on: January 30, 2023   aspirin EC 81 MG tablet Take 1 tablet (81 mg total) by mouth daily. Swallow whole. Start taking on: January 30, 2023   EMOLLIENT EX Apply 1 application  topically 2 (two) times daily. Emollient Lotion. To arms and legs for dry skin.   feeding supplement Liqd Take 237 mLs by mouth 3 (three) times daily between meals.   ferrous sulfate 324 MG Tbec Take 324 mg by mouth daily.   folic acid 1 MG tablet Commonly known as: FOLVITE Take 1 tablet (1 mg total) by mouth daily.   ketoconazole 2 % shampoo Commonly known as: NIZORAL Apply 1 Application topically See admin instructions. Apply topically twice a week on Monday, Thursday for tinea versicolor.   lactulose 10 GM/15ML solution Commonly known as: CHRONULAC Take 30 mLs (20 g total) by mouth 2 (two) times daily. Titrate to 2-3 bowel movements daily   levETIRAcetam 500 MG tablet Commonly known as: KEPPRA Take 1,000 mg by mouth 2 (two) times daily.   multivitamin with minerals Tabs tablet Take 1 tablet by mouth daily.   oseltamivir 75 MG  capsule Commonly known as: TAMIFLU Take 1 capsule (75 mg total) by mouth 2 (two) times daily for 2 days.   pantoprazole 20 MG tablet Commonly known as: PROTONIX Take 1 tablet (20 mg total) by mouth daily.   rosuvastatin 20 MG tablet Commonly known as: CRESTOR Take 1 tablet (20 mg total) by mouth daily. Start taking on: January 30, 2023   thiamine 100 MG tablet Commonly known as: VITAMIN B1 Take 1 tablet (100 mg total) by mouth daily.        Disposition and follow-up:   Hart Hart was discharged from [facility] in Stable condition.  At the hospital follow up visit please address:  1.  Follow-up:  a. Hart 3/5 pneumonia treatment (switching from unasyn to augmentin on discharge). Hart 4/5 tamiflu (needs potentially one dose tonight and 2 tomorrow)    b. Titrate lactulose to 2-3 bowel movements daily   c.  Continue asa 81 and crestor 20 for PAD.   d. No supplemental O2 needed at dc  E. He has a noted stage 1 sacral ulcer, please CTM and initiate wound care/pressure offloading as needed  2.  Labs / imaging needed at time of follow-up: cbc, bmp  3.  Pending labs/ test needing follow-up: none  Follow-up Appointments:  Follow-up Information     Hart Pounds, NP. Schedule an appointment as soon as possible for a visit.   Specialty: Nurse Practitioner Contact information:  [address] [place] [address] [phone]                 Hospital Course by problem list: Hart Hart is a 67 y.o. with history of cirrhosis and seizures who presents from SNF with altered mental status and fever, admitted for encephalopathy of multifactorial etiology including hepatic encephalopathy, flu, sepsis from cellulitis and aspiration pneumonia.  Acute Multifactorial Encephalopathy - resolved Hepatic encephalopathy - resolved Cirrhosis Normally alert and oriented x 3 and bedbound albeit active at SNF. Somnolent and A&Ox2 on presentation. No focal  deficits less suggestive for seizures or a CNS lesion. Initial workup concerning for sepsis with + flu and c/f LLE cellulitis with lactic acidosis as well as hepatic encephalopathy with asterixis and elevated ammonia. Treated with lactulose, fluid resuscitation and initiated Tamiflu and Ancef for nonpurulent cellulitis.  On the night of hospital Hart 0, he did have elevated fevers so Ancef was escalated to vancomycin for empiric coverage.  He did get further imaging which showed likely aspiration pneumonia so vancomycin was changed to Unasyn on hospital Hart 1.  No ascites noted on RUQ U/S. CTH negative. CT PE revealed nodular liver contour consistent with cirrhosis. Encephalopathy likely was due to some component of hepatic encephalopathy along with underlying infections (cellulitis, influenza A, and aspiration PNA).  He is alert and oriented x 3 without asterixis on Hart of discharge.  Feels like he is back at baseline.  Will continue lactulose at discharge and titrate to 2-3 bowel movements a Hart and will also complete course of antibiotics for aspiration pneumonia and Tamiflu as well as below.  Acute hypoxic respiratory failure Aspiration PNA Influenza A infection Presented hypoxic requiring 2 L nasal cannula and was also febrile, tachycardic, with significant leukocytosis and encephalopathy, qualifying for sepsis. Chest x-ray initially did not show any focal abnormalities.  He was flu positive.  Also concern for left lower extremity cellulitis on exam.  After appropriate sepsis response in the ED including broad-spectrum antibiotics and fluid resuscitation, his antibiotics were narrowed to just Ancef for nonpurulent cellulitis and he was initiated on Tamiflu.  Had subsequent improvement in his mentation and hemodynamic status.  He did have persistent fever on hospital night 1.  Given persistent hypoxia, sinus tachycardia, and likely sedentary lifestyle at SNF, as well as Wells score of 6, opted to obtain CT  PE study to rule out PE.  CT PE with no evidence of PE but noted aspiration PNA involving RLL, likely secondary to presenting encephalopathy.  DVT studies were also negative.  His antibiotic coverage was changed to Unasyn which would cover both cellulitis and aspiration pneumonia.  His encephalopathy resolved and he was afebrile, without leukocytosis, and hemodynamically stable after initiation of Unasyn.  His cellulitis is clinically resolved at time of discharge.  He was also weaned to room air at rest.  He maintained O2 sat>97% while ambulating on room air.  He will be discharged back to SNF and will complete course of antibiotics for aspiration pneumonia (changed to Augmentin from Unasyn) and will complete course of Tamiflu as well.    LLE non-purulent Cellulitis Severe bilateral PAD Initially presented with LLE cellulitis and sepsis (see diagnosis and management above). His clinical symptoms subsequently improved and he will complete course with augmentin as above. There was concern for chronic stasis ulcers so ABI was obtained showing noncompressible vasculature. Vascular surgery weighed in and did not recommend and acute procedures because of palpable extremity pulses. He was started on asa81  and crestor 20 for PAD.  Stage 1 sacral ulcer CTM, wound care PRN  Subjective: Feels better today. No trouble breathing or chest/leg pain. Tolerating PO intake. No confusion or agitation. Feels like he is at baseline without oxygen. Amenable to discharge to SNF  Discharge Vitals:   BP 113/67 (BP Location: Right Arm)   Pulse 85   Temp 98.7 F (37.1 C) (Oral)   Resp 16   Ht '6\' 2"'$  (1.88 m)   Wt 110.5 kg   SpO2 98%   BMI 31.28 kg/m  Discharge exam: General: elderly male, sitting up in bed, NAD. CV: normal rate and regular rhythm, no m/r/g. DP pulse 1+ in RLE and faint in LLE. Pulm: normal WOB on RA. coarse lung sounds noted.  Abdomen: soft, nondistended, nontender to palpation, normoactive bowel  sounds. Skin: cool with diffuse flaky/dry skin.  Extremities: distal extremities cool to touch. LLE with significantly improved edema and erythema/skin changes Neuro: A&Ox3, no asterixis, moving all extremities spontaneously.   Pertinent Labs, Studies, and Procedures:     Latest Ref Rng & Units 01/29/2023    8:57 AM 01/28/2023    8:46 AM 01/27/2023    3:05 AM  CBC  WBC 4.0 - 10.5 K/uL 5.0  6.5  12.1   Hemoglobin 13.0 - 17.0 g/dL 12.5  12.3  13.2   Hematocrit 39.0 - 52.0 % 39.0  37.5  39.6   Platelets 150 - 400 K/uL 160  159  170        Latest Ref Rng & Units 01/29/2023    8:57 AM 01/28/2023    8:46 AM 01/27/2023    3:05 AM  CMP  Glucose 70 - 99 mg/dL 103  88  110   BUN 8 - 23 mg/dL '8  5  9   '$ Creatinine 0.61 - 1.24 mg/dL 0.61  0.67  0.74   Sodium 135 - 145 mmol/L 136  138  134   Potassium 3.5 - 5.1 mmol/L 3.7  4.1  3.3   Chloride 98 - 111 mmol/L 100  105  102   CO2 22 - 32 mmol/L '26  26  24   '$ Calcium 8.9 - 10.3 mg/dL 8.7  8.5  8.3   Total Protein 6.5 - 8.1 g/dL  6.4    Total Bilirubin 0.3 - 1.2 mg/dL  0.9    Alkaline Phos 38 - 126 U/L  51    AST 15 - 41 U/L  22    ALT 0 - 44 U/L  10      VAS [place] LOWER EXTREMITY VENOUS (DVT)  Result Date: 01/28/2023  Lower Venous DVT Study Patient Name:  Hart Hart  Date of Exam:   01/27/2023 Medical Rec #: [id number]     Accession #:    [id number] Date of Birth: 1956-07-21     Patient Gender: M Patient Age:   43 years Exam Location:  [facility] Procedure:      VAS [place] LOWER EXTREMITY VENOUS (DVT) Referring Phys: Velna Ochs --------------------------------------------------------------------------------  Indications: Pain, Swelling, and cellulitis.  Limitations: Body habitus and immobility. Comparison Study: Prior negative Left LEV done 01/02/21 and 03/19/19 Performing Technologist: Sharion Dove RVS  Examination Guidelines: A complete evaluation includes B-mode imaging, spectral Doppler, color Doppler, and power Doppler as needed of all  accessible portions of each vessel. Bilateral testing is considered an integral part of a complete examination. Limited examinations for reoccurring indications may be performed as noted. The reflux portion of the exam is performed  with the patient in reverse Trendelenburg.  +---------+---------------+---------+-----------+----------+--------------+ RIGHT    CompressibilityPhasicitySpontaneityPropertiesThrombus Aging +---------+---------------+---------+-----------+----------+--------------+ CFV      Full           Yes      Yes                                 +---------+---------------+---------+-----------+----------+--------------+ SFJ      Full                                                        +---------+---------------+---------+-----------+----------+--------------+ FV Prox  Full                                                        +---------+---------------+---------+-----------+----------+--------------+ FV Mid   Full           Yes      Yes                                 +---------+---------------+---------+-----------+----------+--------------+ FV DistalFull                                                        +---------+---------------+---------+-----------+----------+--------------+ PFV      Full                                                        +---------+---------------+---------+-----------+----------+--------------+ POP      Full           Yes      Yes                                 +---------+---------------+---------+-----------+----------+--------------+ PTV      Full                                                        +---------+---------------+---------+-----------+----------+--------------+ PERO     Full                                                        +---------+---------------+---------+-----------+----------+--------------+    +---------+---------------+---------+-----------+----------+-------------------+ LEFT     CompressibilityPhasicitySpontaneityPropertiesThrombus Aging      +---------+---------------+---------+-----------+----------+-------------------+ CFV      Full           Yes      Yes                                      +---------+---------------+---------+-----------+----------+-------------------+  SFJ      Full                                                             +---------+---------------+---------+-----------+----------+-------------------+ FV Prox  Full                                                             +---------+---------------+---------+-----------+----------+-------------------+ FV Mid   Full           Yes      Yes                                      +---------+---------------+---------+-----------+----------+-------------------+ FV DistalFull                                                             +---------+---------------+---------+-----------+----------+-------------------+ PFV      Full                                                             +---------+---------------+---------+-----------+----------+-------------------+ POP                     Yes      Yes                  patent by color and                                                       Doppler             +---------+---------------+---------+-----------+----------+-------------------+ PTV                                                   patent by color     +---------+---------------+---------+-----------+----------+-------------------+ PERO                                                  patent by color     +---------+---------------+---------+-----------+----------+-------------------+     Summary: BILATERAL: - No evidence of deep vein thrombosis seen in the lower extremities, bilaterally. -No evidence of popliteal cyst, bilaterally. RIGHT: -  Ultrasound characteristics of enlarged lymph nodes are noted in the groin.  LEFT: -  Ultrasound characteristics of enlarged lymph nodes noted in the groin.  *See table(s) above for measurements and observations. Electronically signed by Monica Martinez MD on 01/28/2023 at 10:16:11 AM.    Final    VAS [place] ABI WITH/WO TBI  Result Date: 01/28/2023  LOWER EXTREMITY DOPPLER STUDY Patient Name:  Hart Hart  Date of Exam:   01/27/2023 Medical Rec #: [id number]     Accession #:    [id number] Date of Birth: 1956/03/04     Patient Gender: M Patient Age:   70 years Exam Location:  [facility] Procedure:      VAS [place] ABI WITH/WO TBI Referring Phys: Velna Ochs --------------------------------------------------------------------------------  Indications: Cold extremities. Skin changes. Cellulitis Other Factors: Alcoholic cirrhosis.  Comparison Study: No prior study on file Performing Technologist: Sharion Dove RVS  Examination Guidelines: A complete evaluation includes at minimum, Doppler waveform signals and systolic blood pressure reading at the level of bilateral brachial, anterior tibial, and posterior tibial arteries, when vessel segments are accessible. Bilateral testing is considered an integral part of a complete examination. Photoelectric Plethysmograph (PPG) waveforms and toe systolic pressure readings are included as required and additional duplex testing as needed. Limited examinations for reoccurring indications may be performed as noted.  ABI Findings: +---------+------------------+-----+-----------+--------+ Right    Rt Pressure (mmHg)IndexWaveform   Comment  +---------+------------------+-----+-----------+--------+ Brachial 119                    triphasic           +---------+------------------+-----+-----------+--------+ PTA      136               1.13 multiphasic         +---------+------------------+-----+-----------+--------+ DP       156               1.30 multiphasic          +---------+------------------+-----+-----------+--------+ Great Toe56                0.47 Abnormal            +---------+------------------+-----+-----------+--------+ +---------+------------------+-----+-----------+-------+ Left     Lt Pressure (mmHg)IndexWaveform   Comment +---------+------------------+-----+-----------+-------+ Brachial 120                    triphasic          +---------+------------------+-----+-----------+-------+ PTA      148               1.23 multiphasic        +---------+------------------+-----+-----------+-------+ DP       156               1.30 multiphasic        +---------+------------------+-----+-----------+-------+ Great Toe26                0.22 Abnormal           +---------+------------------+-----+-----------+-------+ +-------+----------------+-----------+------------+------------+ ABI/TBIToday's ABI     Today's TBIPrevious ABIPrevious TBI +-------+----------------+-----------+------------+------------+ Right  Non compressible0.47                                +-------+----------------+-----------+------------+------------+ Left   Non compressible0.22                                +-------+----------------+-----------+------------+------------+   Summary: Right: Resting right ankle-brachial index indicates noncompressible right lower extremity arteries. The right toe-brachial index  is abnormal. Left: Resting left ankle-brachial index indicates noncompressible left lower extremity arteries. The left toe-brachial index is abnormal. *See table(s) above for measurements and observations.  Electronically signed by Monica Martinez MD on 01/28/2023 at 10:15:52 AM.    Final    CT Angio Chest Pulmonary Embolism (PE) W or WO Contrast  Result Date: 01/27/2023 CLINICAL DATA:  Pulmonary embolism (PE) suspected, high prob EXAM: CT ANGIOGRAPHY CHEST WITH CONTRAST TECHNIQUE: Multidetector CT imaging of the chest was performed using  the standard protocol during bolus administration of intravenous contrast. Multiplanar CT image reconstructions and MIPs were obtained to evaluate the vascular anatomy. RADIATION DOSE REDUCTION: This exam was performed according to the departmental dose-optimization program which includes automated exposure control, adjustment of the mA and/or kV according to patient size and/or use of iterative reconstruction technique. CONTRAST:  128m OMNIPAQUE IOHEXOL 350 MG/ML SOLN COMPARISON:  October 21, 2020 FINDINGS: Cardiovascular: Evaluation is limited by contrast timing and motion. No pulmonary embolism through the segmental pulmonary arteries. Heart is mildly enlarged. Pericardial effusion. Thoracic aorta is normal in course and caliber. Mediastinum/Nodes: Visualized thyroid is unremarkable. No axillary adenopathy. Mediastinal lymph nodes are mildly prominent, likely reactive. Representative pretracheal lymph node measures 12 mm in the short axis (series 7, image 113). Lungs/Pleura: Multifocal tracheal and endobronchial debris with downstream hypoenhancing consolidative opacity of the near complete RIGHT lower lobe. Scattered hypoenhancing linear opacities of the LEFT lower lobe downstream of endobronchial debris. Bronchial wall thickening. Upper Abdomen: Nodular liver contour consistent with history of cirrhosis. Cholelithiasis. Grossly similar subcentimeter hypodense mass of the posterior RIGHT liver and hepatic dome (series 5, image 112; series 5, image 75). Favored tiny hiatal hernia. Musculoskeletal: Flowing anterior osteophytes with relative preservation of the disc space likely reflects underlying diffuse idiopathic skeletal hyperostosis. Nodular gynecomastia bilaterally. Review of the MIP images confirms the above findings. IMPRESSION: 1. No acute pulmonary embolism through the segmental pulmonary arteries. If persistent clinical concern, recommend dedicated lower extremity venous ultrasound. 2. Multifocal  tracheal and endobronchial debris with downstream hypoenhancing consolidative opacity of the near complete RIGHT lower lobe. Findings are favored to reflect sequela of aspiration and/or infection. 3. Nodular liver contour consistent with history of cirrhosis. 4. Cholelithiasis. Electronically Signed   By: SValentino SaxonM.D.   On: 01/27/2023 12:28   [place] (ABDOMEN LIMITED)  Result Date: 01/26/2023 CLINICAL DATA:  Cirrhosis.  Assess for ascites. EXAM: LIMITED ABDOMEN ULTRASOUND FOR ASCITES TECHNIQUE: Limited ultrasound survey for ascites was performed in all four abdominal quadrants. COMPARISON:  None Available. FINDINGS: Four quadrant survey of the abdomen demonstrates no significant ascites. IMPRESSION: No significant ascites. Electronically Signed   By: TKerby MoorsM.D.   On: 01/26/2023 17:43   CT HEAD WO CONTRAST (5MM)  Result Date: 01/26/2023 CLINICAL DATA:  Mental status change EXAM: CT HEAD WITHOUT CONTRAST TECHNIQUE: Contiguous axial images were obtained from the base of the skull through the vertex without intravenous contrast. RADIATION DOSE REDUCTION: This exam was performed according to the departmental dose-optimization program which includes automated exposure control, adjustment of the mA and/or kV according to patient size and/or use of iterative reconstruction technique. COMPARISON:  Head CT 11/27/2022.  MRI head 11/27/2022. FINDINGS: Brain: No evidence of acute infarction, hemorrhage, hydrocephalus, extra-axial collection or mass lesion/mass effect. Again seen is mild diffuse atrophy and mild periventricular white matter hypodensity, likely chronic small vessel ischemic change. Vascular: No hyperdense vessel or unexpected calcification. Skull: Normal. Negative for fracture or focal lesion. Sinuses/Orbits: There is mucosal thickening of the  paranasal sinuses diffusely. There are postsurgical changes in the right orbit. Other: None. IMPRESSION: 1. No acute intracranial process. 2.  Mild diffuse atrophy and mild chronic small vessel ischemic change. 3. Diffuse paranasal sinus disease. Electronically Signed   By: Ronney Asters M.D.   On: 01/26/2023 15:50   DG Chest Port 1 View  Result Date: 01/26/2023 CLINICAL DATA:  Questionable sepsis - evaluate for abnormality EXAM: PORTABLE CHEST 1 VIEW COMPARISON:  Radiograph 11/27/2022 FINDINGS: Unchanged elevated right hemidiaphragm. Right basilar subsegmental atelectasis. There is no new airspace disease. There is no large effusion or evidence of pneumothorax. Thoracic spondylosis. Bilateral shoulder degenerative changes. Chronic left clavicle injury. No acute osseous abnormality. IMPRESSION: Unchanged elevated right hemidiaphragm with adjacent basilar subsegmental atelectasis. No new airspace disease. Electronically Signed   By: Maurine Simmering M.D.   On: 01/26/2023 10:57     Discharge Instructions: Discharge Instructions     Call MD for:  difficulty breathing, headache or visual disturbances   Complete by: As directed    Diet - low sodium heart healthy   Complete by: As directed    Discharge instructions   Complete by: As directed    1. Please take 2 more days of augmentin to complete your antibiotic course for pneumonia and one more Hart of tamiflu to complete your course for the flu 2. Titrate lactulose so you are only having 2-3 bowel movements a Hart 3. Please continue to take aspirin '81mg'$  daily and crestor '20mg'$  daily for peripheral artery disease   Increase activity slowly   Complete by: As directed    No wound care   Complete by: As directed        Discharge Instructions   None     Signed: Linus Galas, MD 01/29/2023, 1:53 PM   Pager: [phone]

## 2023-01-31 LAB — CULTURE, BLOOD (ROUTINE X 2)
Culture: NO GROWTH
Culture: NO GROWTH
Special Requests: ADEQUATE

## 2024-01-11 ENCOUNTER — Emergency Department (HOSPITAL_COMMUNITY): Payer: Medicare PPO

## 2024-01-11 ENCOUNTER — Emergency Department (HOSPITAL_COMMUNITY)
Admission: EM | Admit: 2024-01-11 | Discharge: 2024-01-11 | Disposition: A | Discharge status: Home / Self Care | Payer: Medicare PPO | Attending: Emergency Medicine | Admitting: Emergency Medicine

## 2024-01-11 DIAGNOSIS — Z7982 Long term (current) use of aspirin: Secondary | ICD-10-CM | POA: Insufficient documentation

## 2024-01-11 DIAGNOSIS — R4182 Altered mental status, unspecified: Secondary | ICD-10-CM | POA: Diagnosis not present

## 2024-01-11 DIAGNOSIS — R258 Other abnormal involuntary movements: Secondary | ICD-10-CM | POA: Diagnosis present

## 2024-01-11 DIAGNOSIS — R569 Unspecified convulsions: Secondary | ICD-10-CM

## 2024-01-11 LAB — CBC
HCT: 47 % (ref 39.0–52.0)
Hemoglobin: 14.9 g/dL (ref 13.0–17.0)
MCH: 29.1 pg (ref 26.0–34.0)
MCHC: 31.7 g/dL (ref 30.0–36.0)
MCV: 91.8 fL (ref 80.0–100.0)
Platelets: 257 10*3/uL (ref 150–400)
RBC: 5.12 MIL/uL (ref 4.22–5.81)
RDW: 12 % (ref 11.5–15.5)
WBC: 6.3 10*3/uL (ref 4.0–10.5)
nRBC: 0 % (ref 0.0–0.2)

## 2024-01-11 LAB — COMPREHENSIVE METABOLIC PANEL
ALT: 17 U/L (ref 0–44)
AST: 23 U/L (ref 15–41)
Albumin: 3.5 g/dL (ref 3.5–5.0)
Alkaline Phosphatase: 77 U/L (ref 38–126)
Anion gap: 12 (ref 5–15)
BUN: 6 mg/dL — ABNORMAL LOW (ref 8–23)
CO2: 28 mmol/L (ref 22–32)
Calcium: 10 mg/dL (ref 8.9–10.3)
Chloride: 100 mmol/L (ref 98–111)
Creatinine, Ser: 0.82 mg/dL (ref 0.61–1.24)
GFR, Estimated: >60 mL/min (ref >60)
Glucose, Bld: 89 mg/dL (ref 70–99)
Potassium: 4.7 mmol/L (ref 3.5–5.1)
Sodium: 140 mmol/L (ref 135–145)
Total Bilirubin: 1 mg/dL (ref 0.0–1.2)
Total Protein: 7.8 g/dL (ref 6.5–8.1)

## 2024-01-11 LAB — ETHANOL: Alcohol, Ethyl (B): <10 mg/dL (ref <10)

## 2024-01-11 LAB — CBG MONITORING, ED: Glucose-Capillary: 69 mg/dL — ABNORMAL LOW (ref 70–99)

## 2024-01-11 MED ORDER — LEVETIRACETAM IN NACL 1000 MG/100ML IV SOLN
1000.0000 mg | Freq: Once | INTRAVENOUS | Status: AC
  Administered 2024-01-11: 1000 mg via INTRAVENOUS
  Filled 2024-01-11: qty 100

## 2024-01-11 NOTE — Discharge Instructions (Signed)
Mr. Umstead was seen and evaluated here in the emergency department for altered mental status.  He is evaluated with labs and head CT.  He was treated with IV Keppra.  He was observed and has returned to baseline mental status.  He is currently awake and alert and oriented.  No acute abnormality was found and this is likely consistent with his seizure disorder.  Please have him return to the emergency department if he has any new or worsening symptoms.

## 2024-01-11 NOTE — ED Provider Notes (Signed)
[place] EMERGENCY DEPARTMENT AT [facility] Provider Note   CSN: [id number] Arrival date & time: 01/11/24  1110     History  Chief Complaint  Patient presents with   Seizures    Mason Hart is a 68 y.o. male.  HPI Patient presents today via EMS with report of probable seizure.  68 year old male with a known history of seizure disorder who is in a skilled nursing facility.  EMS reports skilled nursing facility noted that he was in his usual state of health when he began having some eye fluttering.  This lasted for 1 to 2 minutes.  He then had decreased responsiveness.  They report they were called due to this.  On their arrival blood sugar and vital signs were normal.  They report the patient has been becoming more responsive on the way to the hospital.  He went out his eyes and answer to his name.  No report of recent illnesses at the facility, head injury, trauma, change in eating habits, or other illness noted.     Home Medications Prior to Admission medications   Medication Sig Start Date End Date Taking? Authorizing Provider  acetaminophen (TYLENOL) 325 MG tablet Take 2 tablets (650 mg total) by mouth every 6 (six) hours as needed for mild pain (or Fever >/= 101). 01/13/21   Mirian Mo, MD  aspirin EC 81 MG tablet Take 1 tablet (81 mg total) by mouth daily. Swallow whole. 01/30/23   Lyndle Herrlich, MD  EMOLLIENT EX Apply 1 application  topically 2 (two) times daily. Emollient Lotion. To arms and legs for dry skin.    [provider]  feeding supplement (ENSURE ENLIVE / ENSURE PLUS) LIQD Take 237 mLs by mouth 3 (three) times daily between meals. Patient not taking: Reported on 01/26/2023 01/13/21   Mirian Mo, MD  ferrous sulfate 324 MG TBEC Take 324 mg by mouth daily.    [provider]  folic acid (FOLVITE) 1 MG tablet Take 1 tablet (1 mg total) by mouth daily. 01/14/21   Mirian Mo, MD  ketoconazole (NIZORAL) 2 % shampoo Apply 1 Application  topically See admin instructions. Apply topically twice a week on Monday, Thursday for tinea versicolor.    [provider]  lactulose (CHRONULAC) 10 GM/15ML solution Take 30 mLs (20 g total) by mouth 2 (two) times daily. Titrate to 2-3 bowel movements daily 01/29/23   Lyndle Herrlich, MD  levETIRAcetam (KEPPRA) 500 MG tablet Take 1,000 mg by mouth 2 (two) times daily.    [provider]  Multiple Vitamin (MULTIVITAMIN WITH MINERALS) TABS tablet Take 1 tablet by mouth daily. 01/14/21   Mirian Mo, MD  pantoprazole (PROTONIX) 20 MG tablet Take 1 tablet (20 mg total) by mouth daily. 01/14/21   Mirian Mo, MD  rosuvastatin (CRESTOR) 20 MG tablet Take 1 tablet (20 mg total) by mouth daily. 01/30/23   Lyndle Herrlich, MD  thiamine 100 MG tablet Take 1 tablet (100 mg total) by mouth daily. 01/14/21   Mirian Mo, MD      Allergies    Patient has no known allergies.    Review of Systems   Review of Systems  Physical Exam Updated Vital Signs BP (!) 111/51   Pulse 74   Temp 97.8 F (36.6 C) (Oral)   Resp 18   SpO2 100%  Physical Exam Vitals reviewed.  Constitutional:      General: He is not in acute distress.    Appearance: Normal appearance. He is  not ill-appearing.  HENT:     Head: Normocephalic.     Right Ear: External ear normal.     Left Ear: External ear normal.     Nose: Nose normal.     Mouth/Throat:     Pharynx: Oropharynx is clear.  Cardiovascular:     Rate and Rhythm: Normal rate and regular rhythm.     Pulses: Normal pulses.  Pulmonary:     Effort: Pulmonary effort is normal.  Abdominal:     General: Bowel sounds are normal.  Musculoskeletal:        General: Normal range of motion.     Cervical back: Normal range of motion.  Skin:    General: Skin is warm.     Capillary Refill: Capillary refill takes less than 2 seconds.  Neurological:     General: No focal deficit present.     Comments: Patient with some eye-opening and decreased  responsiveness     ED Results / Procedures / Treatments   Labs (all labs ordered are listed, but only abnormal results are displayed) Labs Reviewed  COMPREHENSIVE METABOLIC PANEL - Abnormal; Notable for the following components:      Result Value   BUN 6 (*)    All other components within normal limits  CBG MONITORING, ED - Abnormal; Notable for the following components:   Glucose-Capillary 69 (*)    All other components within normal limits  CBC  ETHANOL    EKG EKG Interpretation Date/Time:  Friday January 11 2024 11:44:07 EST Ventricular Rate:  70 PR Interval:  202 QRS Duration:  149 QT Interval:  436 QTC Calculation: 471 R Axis:   37  Text Interpretation: Sinus rhythm Right bundle branch block Confirmed by Margarita Grizzle [phone]) on 01/11/2024 2:36:46 PM  Radiology CT Head Wo Contrast Result Date: 01/11/2024 CLINICAL DATA:  Mental status change, unknown cause. EXAM: CT HEAD WITHOUT CONTRAST TECHNIQUE: Contiguous axial images were obtained from the base of the skull through the vertex without intravenous contrast. RADIATION DOSE REDUCTION: This exam was performed according to the departmental dose-optimization program which includes automated exposure control, adjustment of the mA and/or kV according to patient size and/or use of iterative reconstruction technique. COMPARISON:  Head CT 01/26/2023 and MRI 11/27/2022 FINDINGS: Brain: There is no evidence of an acute infarct, intracranial hemorrhage, mass, midline shift, or extra-axial fluid collection. Patchy hypodensities in the cerebral white matter are unchanged and nonspecific but compatible with mild chronic small vessel ischemic disease. There is mild cerebral atrophy. Vascular: No hyperdense vessel. Skull: No acute fracture or suspicious lesion. Sinuses/Orbits: Small amount of secretions in a posterior right ethmoid air cell. Clear mastoid air cells. Unremarkable orbits. Other: None. IMPRESSION: 1. No evidence of acute  intracranial abnormality. 2. Mild chronic small vessel ischemic disease and cerebral atrophy. Electronically Signed   By: Sebastian Ache M.D.   On: 01/11/2024 14:19    Procedures Procedures    Medications Ordered in ED Medications  levETIRAcetam (KEPPRA) IVPB 1000 mg/100 mL premix (1,000 mg Intravenous New Bag/Given 01/11/24 1326)    ED Course/ Medical Decision Making/ A&P Clinical Course as of 01/11/24 1438  Fri Jan 11, 2024  1336 CBC reviewed interpreted and within normal limits Blood glucose is reviewed interpreted as normal 69 [DR]  1429 CT reviewed and interpreted with no acute abnormality radiologist interpretation concurs [DR]  1430 Complete metabolic panel reviewed interpreted within normal limits CBC reviewed interpreted within normal limits [DR]  1430  Alcohol is less than 10 [DR]  Clinical Course User Index [DR] Margarita Grizzle, MD                                 Medical Decision Making Amount and/or Complexity of Data Reviewed Labs: ordered. Radiology: ordered.  Risk Prescription drug management.   68 year old male presents today with altered mental status.  Per history from skilled nursing facility, via EMS, patient had some seizure type activity with eye fluttering which lasted about 2 minutes.  He appeared to be postictal per EMS report and route and on my initial evaluation. Patient was treated here in the ED with supportive care, placed on monitor, labs and imaging obtained.  Labs are normal, CT of head reveals no acute abnormality, and patient is back to baseline.  He is awake and alert and oriented x 3.  He does not recall exactly what happened.  He received IV Keppra here in the ED. Patient appears stable for discharge back to his facility.        Final Clinical Impression(s) / ED Diagnoses Final diagnoses:  Seizure-like activity (HCC)  Altered mental status, unspecified altered mental status type    Rx / DC Orders ED Discharge Orders     None          Margarita Grizzle, MD 01/11/24 1438

## 2024-01-11 NOTE — ED Triage Notes (Signed)
Pt BIB [facility] from [facility] fro Nursing and rehabilitation. Patient leg was being wrap for dry skin. The Nurse Practitioner was in the patient room and notice 15 minute in the procedure patient eye was focal motor and that lasted for 2 minutes and he was not responding. EMS said patient started to wake up and was responding to his name. The facility reported this happen in the once in the past , nonspecific seizure.Hx of seizure. He did not get any of his medication this morning. Patient is bedridden and uses a wheelchair. Vital signs: BP 150/70, HR 80, saturation 90% and was placed on 4 liters of oxygen and blood sugar 108. Patient is normally A & O x4.

## 2024-04-03 ENCOUNTER — Other Ambulatory Visit: Payer: Self-pay

## 2024-04-03 ENCOUNTER — Emergency Department (HOSPITAL_COMMUNITY)

## 2024-04-03 ENCOUNTER — Emergency Department (HOSPITAL_COMMUNITY)
Admission: EM | Admit: 2024-04-03 | Discharge: 2024-04-03 | Disposition: A | Discharge status: Home / Self Care | Attending: Student | Admitting: Student

## 2024-04-03 DIAGNOSIS — Y900 Blood alcohol level of less than 20 mg/100 ml: Secondary | ICD-10-CM | POA: Insufficient documentation

## 2024-04-03 DIAGNOSIS — G40909 Epilepsy, unspecified, not intractable, without status epilepticus: Secondary | ICD-10-CM | POA: Diagnosis present

## 2024-04-03 DIAGNOSIS — E039 Hypothyroidism, unspecified: Secondary | ICD-10-CM | POA: Insufficient documentation

## 2024-04-03 DIAGNOSIS — Z79899 Other long term (current) drug therapy: Secondary | ICD-10-CM | POA: Insufficient documentation

## 2024-04-03 DIAGNOSIS — Z7982 Long term (current) use of aspirin: Secondary | ICD-10-CM | POA: Diagnosis not present

## 2024-04-03 DIAGNOSIS — I10 Essential (primary) hypertension: Secondary | ICD-10-CM | POA: Insufficient documentation

## 2024-04-03 DIAGNOSIS — R569 Unspecified convulsions: Secondary | ICD-10-CM

## 2024-04-03 LAB — COMPREHENSIVE METABOLIC PANEL WITH GFR
ALT: 18 U/L (ref 0–44)
AST: 24 U/L (ref 15–41)
Albumin: 3.3 g/dL — ABNORMAL LOW (ref 3.5–5.0)
Alkaline Phosphatase: 71 U/L (ref 38–126)
Anion gap: 12 (ref 5–15)
BUN: 7 mg/dL — ABNORMAL LOW (ref 8–23)
CO2: 24 mmol/L (ref 22–32)
Calcium: 9.2 mg/dL (ref 8.9–10.3)
Chloride: 101 mmol/L (ref 98–111)
Creatinine, Ser: 0.82 mg/dL (ref 0.61–1.24)
GFR, Estimated: >60 mL/min (ref >60)
Glucose, Bld: 87 mg/dL (ref 70–99)
Potassium: 4.1 mmol/L (ref 3.5–5.1)
Sodium: 137 mmol/L (ref 135–145)
Total Bilirubin: 0.6 mg/dL (ref 0.0–1.2)
Total Protein: 7.5 g/dL (ref 6.5–8.1)

## 2024-04-03 LAB — CBC WITH DIFFERENTIAL/PLATELET
Abs Immature Granulocytes: 0.02 10*3/uL (ref 0.00–0.07)
Basophils Absolute: 0.1 10*3/uL (ref 0.0–0.1)
Basophils Relative: 1 %
Eosinophils Absolute: 0.2 10*3/uL (ref 0.0–0.5)
Eosinophils Relative: 2 %
HCT: 48.7 % (ref 39.0–52.0)
Hemoglobin: 15 g/dL (ref 13.0–17.0)
Immature Granulocytes: 0 %
Lymphocytes Relative: 30 %
Lymphs Abs: 2.3 10*3/uL (ref 0.7–4.0)
MCH: 28.8 pg (ref 26.0–34.0)
MCHC: 30.8 g/dL (ref 30.0–36.0)
MCV: 93.7 fL (ref 80.0–100.0)
Monocytes Absolute: 0.5 10*3/uL (ref 0.1–1.0)
Monocytes Relative: 6 %
Neutro Abs: 4.7 10*3/uL (ref 1.7–7.7)
Neutrophils Relative %: 61 %
Platelets: 255 10*3/uL (ref 150–400)
RBC: 5.2 MIL/uL (ref 4.22–5.81)
RDW: 11.9 % (ref 11.5–15.5)
WBC: 7.7 10*3/uL (ref 4.0–10.5)
nRBC: 0 % (ref 0.0–0.2)

## 2024-04-03 LAB — RAPID URINE DRUG SCREEN, HOSP PERFORMED
Amphetamines: NOT DETECTED
Barbiturates: NOT DETECTED
Benzodiazepines: NOT DETECTED
Cocaine: NOT DETECTED
Opiates: NOT DETECTED
Tetrahydrocannabinol: NOT DETECTED

## 2024-04-03 LAB — URINALYSIS, ROUTINE W REFLEX MICROSCOPIC
Bilirubin Urine: NEGATIVE
Glucose, UA: NEGATIVE mg/dL
Hgb urine dipstick: NEGATIVE
Ketones, ur: NEGATIVE mg/dL
Leukocytes,Ua: NEGATIVE
Nitrite: NEGATIVE
Protein, ur: NEGATIVE mg/dL
Specific Gravity, Urine: 1.008 (ref 1.005–1.030)
pH: 8 (ref 5.0–8.0)

## 2024-04-03 LAB — CBG MONITORING, ED: Glucose-Capillary: 97 mg/dL (ref 70–99)

## 2024-04-03 LAB — ETHANOL: Alcohol, Ethyl (B): <15 mg/dL (ref <15)

## 2024-04-03 MED ORDER — LEVETIRACETAM (KEPPRA) 500 MG/5 ML ADULT IV PUSH
2000.0000 mg | Freq: Once | INTRAVENOUS | Status: AC
  Administered 2024-04-03: 2000 mg via INTRAVENOUS

## 2024-04-03 NOTE — ED Notes (Signed)
Went to ct

## 2024-04-03 NOTE — ED Triage Notes (Signed)
 BIB [facility] from [facility] for witnessed seizure that lasted 2 minutes per staff. On EMS arrival patients GCS was 7, upon arrival to ED patients GCS is 15.   170/100 106 cbg  105 hr 95 RA

## 2024-04-03 NOTE — ED Provider Notes (Signed)
 [place] EMERGENCY DEPARTMENT AT [facility] Provider Note  CSN: [id number] Arrival date & time: 04/03/24 1044  Chief Complaint(s) No chief complaint on file.  HPI Mason Hart is a 68 y.o. male with PMH seizure disorder, alcohol use disorder, hypertension, hypothyroidism, hepatic cirrhosis who presents emerged part for evaluation of a seizure.  Seizure witnessed by skilled nursing facility staff.  Last approximately 2 minutes and self resolved.  Did have postictal period but here in the emergency department patient is return to normal mental status baseline.  Patient states he is unsure if he gets medications every day and is unsure if he has been taking his Keppra .  No known head trauma.  Patient currently denies chest pain, shortness of breath, abdominal pain or nausea, vomiting or other systemic symptoms.   Past Medical History Past Medical History:  Diagnosis Date   Alcohol abuse    Hypertension    Hypothyroidism    Noncompliance with medication regimen    RBBB (right bundle branch block)    Seizures (HCC)    Patient Active Problem List   Diagnosis Date Noted   Encephalopathy, hepatic (HCC) 01/29/2023   Sepsis (HCC) 01/27/2023   Cellulitis of leg, left 01/26/2023   Influenza A 01/26/2023   Acute encephalopathy 11/28/2022   Acute metabolic encephalopathy 11/27/2022   Protein-calorie malnutrition, severe 01/05/2021   Hypothyroidism 01/04/2021   Hypoxia 01/04/2021   Volume overload 01/04/2021   Diastolic dysfunction 01/04/2021   Community acquired pneumonia of right lung    Hypernatremia    Thrombocytopenia (HCC)    Weakness    SOB (shortness of breath)    Altered mental status 01/02/2021   Lactic acidosis 10/21/2020   Metabolic acidosis 10/21/2020   Hepatic cirrhosis (HCC) 05/26/2020   Seizure disorder (HCC) 04/29/2020   Gait abnormality 04/29/2020   Acute hypoxic respiratory failure (HCC) 04/13/2020   Alcohol intoxication with delirium (HCC) 06/04/2019    Pressure injury of skin 03/19/2019   Breakthrough seizure (HCC) 03/18/2019   Edema of left lower extremity 03/18/2019   Symptomatic anemia 03/18/2019   Anemia, normocytic normochromic 12/05/2018   Endotracheally intubated 12/05/2018   Hypothermia 12/05/2018   Hyponatremia 12/05/2018   Postictal state (HCC) 12/05/2018   Sinusitis 12/05/2018   Abnormal chest x-ray 12/05/2018   Hypotension 12/05/2018   Bradycardia 12/05/2018   ARF (acute renal failure) (HCC) 11/14/2018   Clavicle fracture 11/14/2018   Hypokalemia 06/24/2017   Hypoglycemia 06/24/2017   Alcohol use 06/24/2017   Seizure (HCC) 06/24/2017   Noncompliance with medication regimen    Seizures (HCC)    Home Medication(s) Prior to Admission medications   Medication Sig Start Date End Date Taking? Authorizing Provider  acetaminophen  (TYLENOL ) 325 MG tablet Take 2 tablets (650 mg total) by mouth every 6 (six) hours as needed for mild pain (or Fever >/= 101). 01/13/21   Jovita Nipper, MD  aspirin  EC 81 MG tablet Take 1 tablet (81 mg total) by mouth daily. Swallow whole. 01/30/23   Sridharan, Sriramkumar, MD  EMOLLIENT EX Apply 1 application  topically 2 (two) times daily. Emollient Lotion. To arms and legs for dry skin.    [provider]  feeding supplement (ENSURE ENLIVE / ENSURE PLUS) LIQD Take 237 mLs by mouth 3 (three) times daily between meals. Patient not taking: Reported on 01/26/2023 01/13/21   Jovita Nipper, MD  ferrous sulfate  324 MG TBEC Take 324 mg by mouth daily.    [provider]  folic acid  (FOLVITE ) 1 MG tablet Take  1 tablet (1 mg total) by mouth daily. 01/14/21   Jovita Nipper, MD  ketoconazole (NIZORAL) 2 % shampoo Apply 1 Application topically See admin instructions. Apply topically twice a week on Monday, Thursday for tinea versicolor.    [provider]  lactulose  (CHRONULAC ) 10 GM/15ML solution Take 30 mLs (20 g total) by mouth 2 (two) times daily. Titrate to 2-3 bowel movements daily 01/29/23    Sridharan, Sriramkumar, MD  levETIRAcetam  (KEPPRA ) 500 MG tablet Take 1,000 mg by mouth 2 (two) times daily.    [provider]  Multiple Vitamin (MULTIVITAMIN WITH MINERALS) TABS tablet Take 1 tablet by mouth daily. 01/14/21   Jovita Nipper, MD  pantoprazole  (PROTONIX ) 20 MG tablet Take 1 tablet (20 mg total) by mouth daily. 01/14/21   Jovita Nipper, MD  rosuvastatin  (CRESTOR ) 20 MG tablet Take 1 tablet (20 mg total) by mouth daily. 01/30/23   Sridharan, Sriramkumar, MD  thiamine  100 MG tablet Take 1 tablet (100 mg total) by mouth daily. 01/14/21   Jovita Nipper, MD                                                                                                                                    Past Surgical History Past Surgical History:  Procedure Laterality Date   NO PAST SURGERIES     Family History Family History  Problem Relation Age of Onset   Heart disease Mother    Other Father        unsure of medical history   Cancer Neg Hx    Diabetes Neg Hx    Stroke Neg Hx     Social History Social History   Tobacco Use   Smoking status: Never   Smokeless tobacco: Never  Vaping Use   Vaping status: Never Used  Substance Use Topics   Alcohol use: Not Currently   Drug use: No   Allergies Patient has no known allergies.  Review of Systems Review of Systems  Neurological:  Positive for seizures.    Physical Exam Vital Signs  I have reviewed the triage vital signs BP (!) 141/93 (BP Location: Right Arm)   Pulse 99   Temp 98.1 F (36.7 C) (Oral)   Resp 16   SpO2 95%   Physical Exam Constitutional:      General: He is not in acute distress.    Appearance: Normal appearance.  HENT:     Head: Normocephalic and atraumatic.     Nose: No congestion or rhinorrhea.  Eyes:     General:        Right eye: No discharge.        Left eye: No discharge.     Extraocular Movements: Extraocular movements intact.     Pupils: Pupils are equal, round, and reactive to light.   Cardiovascular:     Rate and Rhythm: Normal rate and regular rhythm.     Heart sounds: No  murmur heard. Pulmonary:     Effort: No respiratory distress.     Breath sounds: No wheezing or rales.  Abdominal:     General: There is no distension.     Tenderness: There is no abdominal tenderness.  Musculoskeletal:        General: Normal range of motion.     Cervical back: Normal range of motion.  Skin:    General: Skin is warm and dry.  Neurological:     General: No focal deficit present.     Mental Status: He is alert.     ED Results and Treatments Labs (all labs ordered are listed, but only abnormal results are displayed) Labs Reviewed  COMPREHENSIVE METABOLIC PANEL WITH GFR  CBC WITH DIFFERENTIAL/PLATELET  URINALYSIS, ROUTINE W REFLEX MICROSCOPIC  RAPID URINE DRUG SCREEN, HOSP PERFORMED                                                                                                                          Radiology No results found.  Pertinent labs & imaging results that were available during my care of the patient were reviewed by me and considered in my medical decision making (see MDM for details).  Medications Ordered in ED Medications - No data to display                                                                                                                                   Procedures Procedures  (including critical care time)  Medical Decision Making / ED Course   This patient presents to the ED for concern of seizure, this involves an extensive number of treatment options, and is a complaint that carries with it a high risk of complications and morbidity.  The differential diagnosis includes medication noncompliance, meningitis, posterior reversible encephalopathy syndrome, hyponatremia, convulsive syncope, focal lesion/mass, head trauma, intracerebral hemorrhage, toxins/recreational drugs, pseudoseizure  MDM: Patient seen emerged part for evaluation of  a seizure.  Physical exam is unremarkable with no focal motor or sensory deficits.  No cranial nerve deficits.  Laboratory evaluation is unremarkable.  CT head unremarkable.  Patient Keppra  loaded and observed for multiple hours Emergency Department.  No persistent seizure behavior.  At this time he does not meet inpatient criteria for admission will be discharged with outpatient neurology follow-up.   Additional history obtained:  -External records from outside source obtained and reviewed including: Chart  review including previous notes, labs, imaging, consultation notes   Lab Tests: -I ordered, reviewed, and interpreted labs.   The pertinent results include:   Labs Reviewed  COMPREHENSIVE METABOLIC PANEL WITH GFR  CBC WITH DIFFERENTIAL/PLATELET  URINALYSIS, ROUTINE W REFLEX MICROSCOPIC  RAPID URINE DRUG SCREEN, HOSP PERFORMED      Imaging Studies ordered: I ordered imaging studies including CT head I independently visualized and interpreted imaging. I agree with the radiologist interpretation   Medicines ordered and prescription drug management: No orders of the defined types were placed in this encounter.   -I have reviewed the patients home medicines and have made adjustments as needed  Critical interventions none   Cardiac Monitoring: The patient was maintained on a cardiac monitor.  I personally viewed and interpreted the cardiac monitored which showed an underlying rhythm of: NSR  Social Determinants of Health:  Factors impacting patients care include: Lives in skilled nursing facility   Reevaluation: After the interventions noted above, I reevaluated the patient and found that they have :improved  Co morbidities that complicate the patient evaluation  Past Medical History:  Diagnosis Date   Alcohol abuse    Hypertension    Hypothyroidism    Noncompliance with medication regimen    RBBB (right bundle branch block)    Seizures (HCC)       Dispostion: I  considered admission for this patient, but at this time he does not meet inpatient criteria for admission and will be discharged with outpatient follow-up.     Final Clinical Impression(s) / ED Diagnoses Final diagnoses:  None     @PCDICTATION @    Karlyn Overman, MD 04/03/24 1527

## 2024-04-04 LAB — LEVETIRACETAM LEVEL: Levetiracetam Lvl: 7.8 ug/mL — ABNORMAL LOW (ref 10.0–40.0)

## 2024-04-17 ENCOUNTER — Ambulatory Visit (INDEPENDENT_AMBULATORY_CARE_PROVIDER_SITE_OTHER): Admitting: Neurology

## 2024-04-17 ENCOUNTER — Encounter: Payer: Self-pay | Admitting: Neurology

## 2024-04-17 VITALS — BP 136/75 | HR 58 | Resp 14 | Ht 73.0 in

## 2024-04-17 DIAGNOSIS — F03B0 Unspecified dementia, moderate, without behavioral disturbance, psychotic disturbance, mood disturbance, and anxiety: Secondary | ICD-10-CM | POA: Diagnosis not present

## 2024-04-17 DIAGNOSIS — G40909 Epilepsy, unspecified, not intractable, without status epilepticus: Secondary | ICD-10-CM | POA: Diagnosis not present

## 2024-04-17 NOTE — Progress Notes (Signed)
 Chief Complaint  Patient presents with   New Patient (Initial Visit)    Rm14, cna from [facility] present, urgent internal ED referral for breakthrough seizures: last sz was last week according to pt       ASSESSMENT AND PLAN  Mason Hart is a 68 y.o. male   Dementia, nursing home resident Recurrent seizure  Most recent seizure reported with at his emergency room visit on Apr 03, 2024, Keppra  level was decreased 7.8, this was significantly lower compared to his previous baseline level of 27.9, compliance may be an issue, per record he is supposed to take Keppra  1000 mg twice a day,  Repeat Keppra  level, repeat EEG  Increase Keppra  to 1000/1500 mg  Return To Clinic With NP In 9 Months   DIAGNOSTIC DATA (LABS, IMAGING, TESTING) - I reviewed patient records, labs, notes, testing and imaging myself where available.   MEDICAL HISTORY:  Mason Hart, is a 68 year old male, accompanied by facility care giver from [facility], seen in request by his primary care nurse practitioner Fleming, Zelda for evaluation of seizure,   History is obtained from the patient and review of electronic medical records. I personally reviewed pertinent available imaging films in PACS.   PMHx of  HLD  Patient is a poor historian, history is mainly by reviewing multiple previous ED and hospital notes, patient is oriented to his birthday, but does not know his age, or year.  He was treated at emergency room on Apr 03, 2024, for recurrent seizure, witnessed by nursing facility staff, Keppra  level was 7.8, this was significantly low compared to previous baseline level of 27  He is supposed to take Keppra  1000 mg twice a day, suspicious there is compliance issue  CT head showed no acute abnormality, mild atrophy, moderate small vessel disease  Laboratory evaluation showed negative UDS, alcohol level, UA, Keppra  level 7.8, normal CBC, CMP  ED on January 11 2024 also for recurrent seizure, CT head no  acute abnormality  MRI of the brain without contrast January 2024 showed moderate small vessel disease, no acute abnormality  Per record, he had long history of alcohol use, seizure, 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  PHYSICAL EXAMNIATION:  Gen: NAD, conversant, well nourised, well groomed                     Cardiovascular: Regular rate rhythm, no peripheral edema, warm, nontender. Eyes: Conjunctivae clear without exudates or hemorrhage Neck: Supple, no carotid bruits. Pulmonary: Clear to auscultation bilaterally   NEUROLOGICAL EXAM:  MENTAL STATUS: Speech/cognition: Awake, follow commands, CRANIAL NERVES: CN II: Visual fields are full to confrontation. Pupils are round equal and briskly reactive to light. CN III, IV, VI: extraocular movement are normal. No ptosis. CN V: Facial sensation is intact to light touch CN VII: Face is symmetric with normal eye closure  CN VIII: Hearing is normal to causal conversation. CN IX, X: Phonation is normal. CN XI: Head turning and shoulder shrug are intact  MOTOR: Moving 4 extremity against gravity, left intrinsic skin muscle atrophy, with mild to moderate weakness.  Mild fixed contraction of bilateral knee, maximum extension 170 degree, left lower extremity wrapped  REFLEXES: Reflexes are 1 and symmetric at the biceps, triceps, knees, and ankles. Plantar responses are flexor.  SENSORY: Length-dependent sensory changes to knee level  COORDINATION: There is no trunk or limb dysmetria noted.  GAIT/STANCE: Deferred  REVIEW OF SYSTEMS:  Full 14 system review of systems performed and notable only for as above All other  review of systems were negative.   ALLERGIES: No Known Allergies  HOME MEDICATIONS: Current Outpatient Medications  Medication Sig Dispense Refill   acetaminophen  (TYLENOL ) 325 MG tablet Take 2 tablets (650 mg total) by mouth every 6 (six) hours as needed for mild pain (or Fever >/= 101).     aspirin  EC 81 MG tablet Take 1 tablet (81 mg total) by mouth daily. Swallow whole. 30 tablet 12   diphenhydrAMINE  (BENADRYL ) 25 mg capsule Take 25 mg by mouth every 6 (six) hours as needed.     ferrous sulfate  324 MG TBEC Take 324 mg by mouth daily.     folic acid  (FOLVITE ) 1 MG tablet Take 1 tablet (1 mg total) by mouth daily. 30 tablet    ketoconazole (NIZORAL) 2 % shampoo Apply 1 Application topically See admin instructions. Apply topically twice a week on Monday, Thursday for tinea versicolor.     lactulose  (CHRONULAC ) 10 GM/15ML solution Take 30 mLs (20 g total) by mouth 2 (two) times daily. Titrate to 2-3 bowel movements daily 236 mL 0   levETIRAcetam  (KEPPRA ) 500 MG tablet Take 1,000 mg by mouth 2 (two) times daily.     midazolam  (VERSED ) 10 MG/2ML SOLN injection Inject 2 mg into the vein daily as needed for agitation (for break through seizure).     Multiple Vitamin (MULTIVITAMIN WITH MINERALS) TABS tablet Take 1 tablet by mouth daily.     pantoprazole  (PROTONIX ) 20 MG tablet Take 1 tablet (20 mg total) by mouth daily.     rosuvastatin  (CRESTOR ) 20 MG tablet Take 1 tablet (20 mg total) by mouth daily.     thiamine  100 MG tablet Take 1 tablet (100 mg total) by mouth daily.     No current facility-administered medications for this visit.    PAST MEDICAL HISTORY: Past Medical History:  Diagnosis Date   Alcohol abuse    Hypertension    Hypothyroidism    Noncompliance with medication regimen    RBBB (right bundle branch block)    Seizures (HCC)     PAST SURGICAL HISTORY: Past Surgical History:  Procedure Laterality Date   NO PAST SURGERIES      FAMILY HISTORY: Family History   Problem Relation Age of Onset   Heart disease Mother    Other Father        unsure of medical history   Cancer Neg Hx    Diabetes Neg Hx    Stroke Neg Hx     SOCIAL HISTORY: Social History   Socioeconomic History   Marital status: Legally Separated    Spouse name: Not on file   Number of children: 0   Years of education: 12   Highest education level: High school graduate  Occupational History   Occupation: Retired  Tobacco Use   Smoking status: Never   Smokeless tobacco: Never  Vaping Use   Vaping status: Never Used  Substance and Sexual Activity   Alcohol use: Not Currently   Drug use: No   Sexual activity: Not Currently    Birth control/protection: None  Other Topics Concern   Not on file  Social History Narrative   Lives alone.   Right-handed.   Occasional use of caffeine.   Social Drivers of Corporate investment banker Strain: Not on file  Food Insecurity: No Food Insecurity (01/26/2023)   Hunger Vital Sign    Worried About Running Out of Food in the Last Year: Never true    Ran Out of Food in the Last Year: Never true  Transportation Needs: No Transportation Needs (01/26/2023)   PRAPARE - Administrator, Civil Service (Medical): No    Lack of Transportation (Non-Medical): No  Physical Activity: Not on file  Stress: Not on file  Social Connections: Not on file  Intimate Partner Violence: Not At Risk (01/26/2023)   Humiliation, Afraid, Rape, and Kick questionnaire    Fear of Current or Ex-Partner: No    Emotionally Abused: No    Physically Abused: No    Sexually Abused: No      Phebe Brasil, M.D. Ph.D.  [facility] [address], [address] [place], [place] [address] Ph: [phone] Fax: [phone]  CC:  Collins Dean, NP [address] [place] [address] [place],  [place] [address]  Collins Dean, NP

## 2024-04-18 LAB — TSH: TSH: 2.52 u[IU]/mL (ref 0.450–4.500)

## 2024-04-18 LAB — VITAMIN B12: Vitamin B-12: 674 pg/mL (ref 232–1245)

## 2024-04-18 LAB — RPR: RPR Ser Ql: NONREACTIVE

## 2024-04-18 LAB — HIV ANTIBODY (ROUTINE TESTING W REFLEX): HIV Screen 4th Generation wRfx: NONREACTIVE

## 2024-04-22 ENCOUNTER — Ambulatory Visit: Payer: Self-pay | Admitting: Neurology

## 2024-04-24 ENCOUNTER — Ambulatory Visit (INDEPENDENT_AMBULATORY_CARE_PROVIDER_SITE_OTHER): Admitting: Neurology

## 2024-04-24 DIAGNOSIS — G40909 Epilepsy, unspecified, not intractable, without status epilepticus: Secondary | ICD-10-CM | POA: Diagnosis not present

## 2024-04-24 DIAGNOSIS — F03B0 Unspecified dementia, moderate, without behavioral disturbance, psychotic disturbance, mood disturbance, and anxiety: Secondary | ICD-10-CM

## 2024-05-02 NOTE — Procedures (Signed)
   HISTORY: 67 year old nursing home resident with dementia  TECHNIQUE:  This is a routine 16 channel EEG recording with one channel devoted to a limited EKG recording.  It was performed during wakefulness, drowsiness and asleep.  Photic stimulation were performed as activating procedures.  There are minimum muscle and movement artifact noted.  Upon maximum arousal, posterior dominant waking rhythm consistent of low amplitude mildly dysrhythmic mixed alpha and theta range activity. Activities are symmetric over the bilateral posterior derivations and attenuated with eye opening.  Photic stimulation did not alter the tracing.   During EEG recording, patient developed drowsiness and no deeper stage of sleep was achieved  During EEG recording, there was no epileptiform discharge noted.  EKG demonstrate normal sinus rhythm.  CONCLUSION: This is a mild abnormal EEG due to mild background slowing, indicating mild bihemispheric malfunction.  Shelda Truby, M.D. Ph.D.  [facility] [address] [place], [place] [address] Phone: [phone] Fax:      [phone]
